# Patient Record
Sex: Female | Born: 1955 | Race: Black or African American | Hispanic: No | Smoking: Current every day smoker
Health system: Southern US, Community
[De-identification: ages and names within clinical notes are randomized; demographics above are authoritative.]

## PROBLEM LIST (undated history)

## (undated) DIAGNOSIS — G959 Disease of spinal cord, unspecified: Secondary | ICD-10-CM

## (undated) DIAGNOSIS — I700 Atherosclerosis of aorta: Secondary | ICD-10-CM

## (undated) DIAGNOSIS — J432 Centrilobular emphysema: Secondary | ICD-10-CM

## (undated) DIAGNOSIS — Z720 Tobacco use: Secondary | ICD-10-CM

## (undated) DIAGNOSIS — E785 Hyperlipidemia, unspecified: Secondary | ICD-10-CM

## (undated) DIAGNOSIS — F1210 Cannabis abuse, uncomplicated: Secondary | ICD-10-CM

## (undated) DIAGNOSIS — B1920 Unspecified viral hepatitis C without hepatic coma: Secondary | ICD-10-CM

## (undated) DIAGNOSIS — D649 Anemia, unspecified: Secondary | ICD-10-CM

## (undated) DIAGNOSIS — F17200 Nicotine dependence, unspecified, uncomplicated: Secondary | ICD-10-CM

## (undated) DIAGNOSIS — I10 Essential (primary) hypertension: Secondary | ICD-10-CM

## (undated) DIAGNOSIS — F1010 Alcohol abuse, uncomplicated: Secondary | ICD-10-CM

## (undated) HISTORY — PX: ABDOMINAL HERNIA REPAIR: SHX539

---

## 2013-08-11 ENCOUNTER — Ambulatory Visit
Admission: RE | Admit: 2013-08-11 | Discharge: 2013-08-11 | Disposition: A | Discharge status: Home / Self Care | Payer: No Typology Code available for payment source | Source: Ambulatory Visit | Attending: Infectious Disease | Admitting: Infectious Disease

## 2013-08-11 ENCOUNTER — Other Ambulatory Visit: Payer: Self-pay | Admitting: Infectious Disease

## 2013-08-11 DIAGNOSIS — R7611 Nonspecific reaction to tuberculin skin test without active tuberculosis: Secondary | ICD-10-CM

## 2013-08-11 IMAGING — CR DG CHEST 1V
1 series · 1 of 1 positions shown · non-contrast
Comparison: None.

CLINICAL DATA: Positive PPD test.

EXAM:
CHEST - 1 VIEW

[view not recorded]
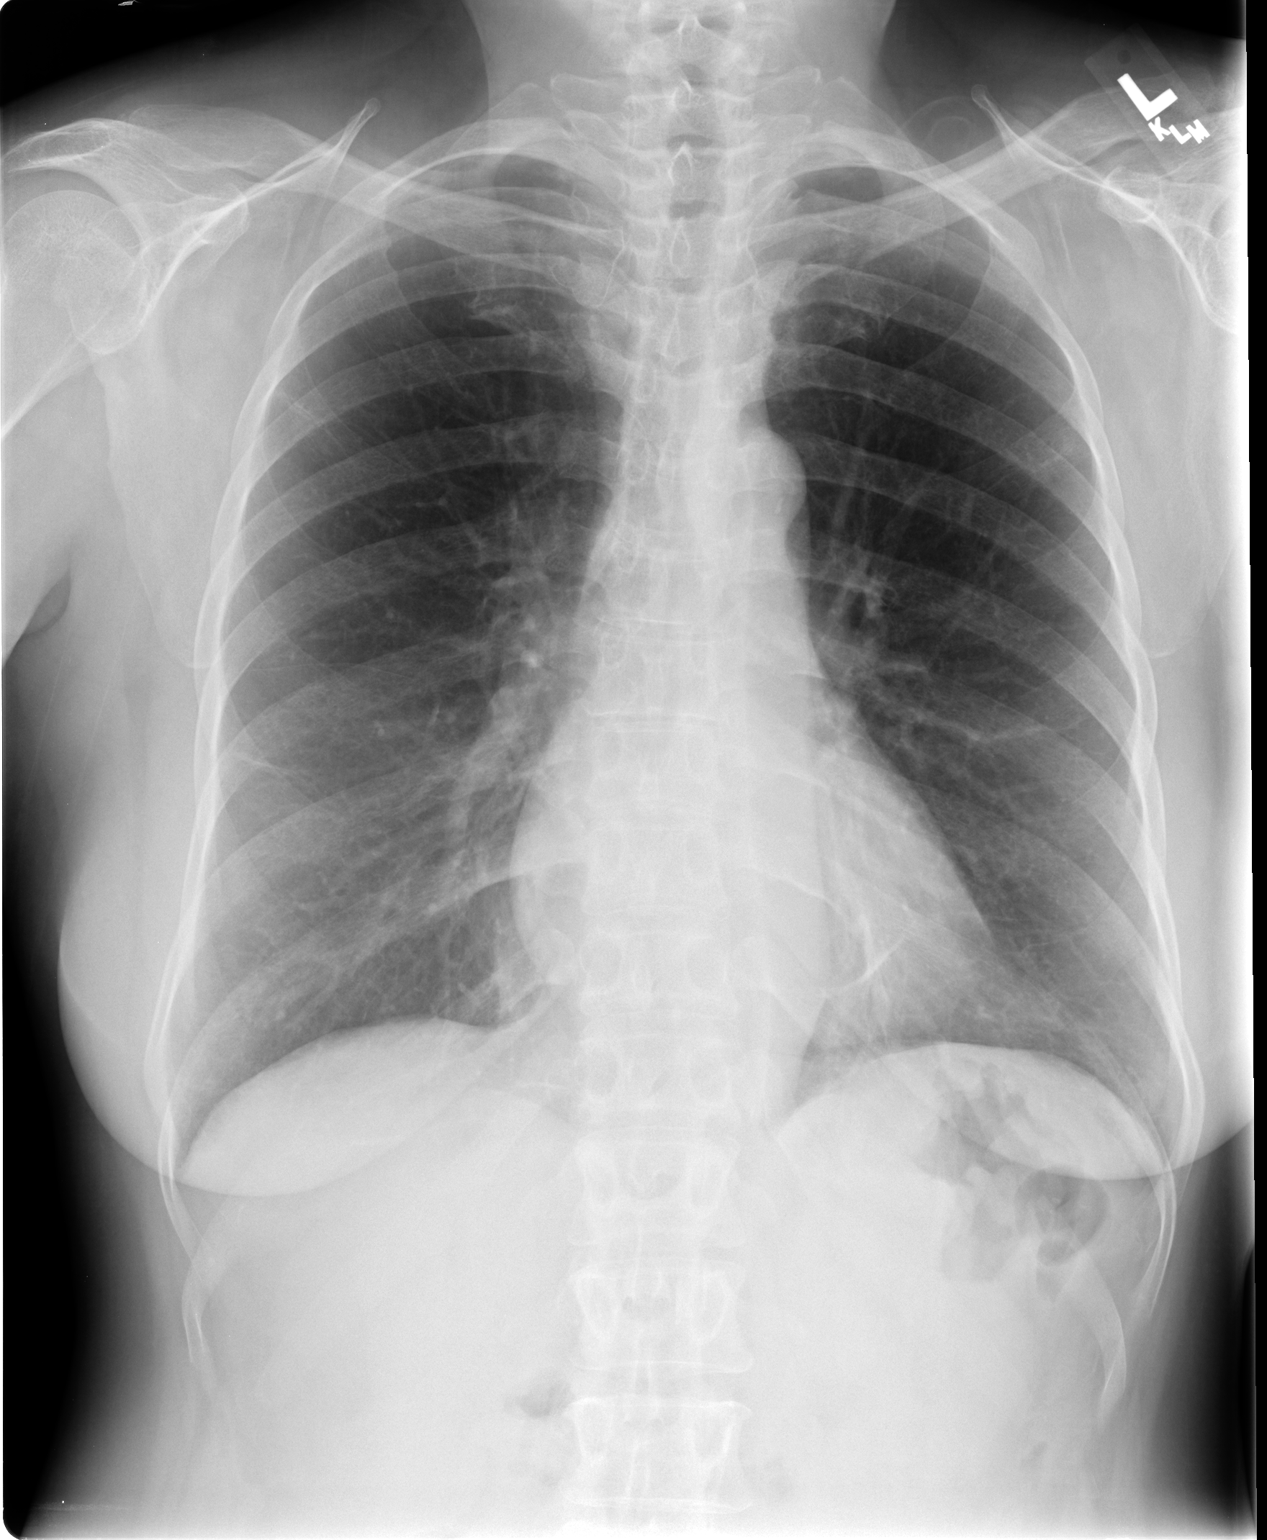

[1 of 1 positions shown; findings below may reference images not displayed]

FINDINGS: Lungs appear hyperinflated. Scattered areas of subsegmental scarring
are present in both lungs. No airspace disease. No cavitary lesions.
Retrocardiac scarring or atelectasis is also noted. The
cardiopericardial silhouette appears within normal limits. Aortic
arch atherosclerosis incidentally noted.
IMPRESSION: No evidence of pulmonary tuberculosis. Scattered areas of pulmonary
parenchymal scarring.

## 2013-08-31 ENCOUNTER — Emergency Department (HOSPITAL_COMMUNITY): Payer: Self-pay

## 2013-08-31 ENCOUNTER — Emergency Department (HOSPITAL_COMMUNITY)
Admission: EM | Admit: 2013-08-31 | Discharge: 2013-08-31 | Disposition: A | Discharge status: Home / Self Care | Payer: Self-pay | Attending: Emergency Medicine | Admitting: Emergency Medicine

## 2013-08-31 ENCOUNTER — Encounter (HOSPITAL_COMMUNITY): Payer: Self-pay | Admitting: Emergency Medicine

## 2013-08-31 DIAGNOSIS — IMO0001 Reserved for inherently not codable concepts without codable children: Secondary | ICD-10-CM | POA: Insufficient documentation

## 2013-08-31 DIAGNOSIS — F17200 Nicotine dependence, unspecified, uncomplicated: Secondary | ICD-10-CM | POA: Insufficient documentation

## 2013-08-31 DIAGNOSIS — Z79899 Other long term (current) drug therapy: Secondary | ICD-10-CM | POA: Insufficient documentation

## 2013-08-31 DIAGNOSIS — M791 Myalgia, unspecified site: Secondary | ICD-10-CM

## 2013-08-31 LAB — BASIC METABOLIC PANEL
BUN: 13 mg/dL (ref 6–23)
CO2: 27 mEq/L (ref 19–32)
Calcium: 9.5 mg/dL (ref 8.4–10.5)
Chloride: 102 mEq/L (ref 96–112)
Creatinine, Ser: 0.89 mg/dL (ref 0.50–1.10)
GFR calc Af Amer: 82 mL/min — ABNORMAL LOW (ref >90)
GFR calc non Af Amer: 71 mL/min — ABNORMAL LOW (ref >90)
Glucose, Bld: 85 mg/dL (ref 70–99)
Potassium: 5.7 mEq/L — ABNORMAL HIGH (ref 3.7–5.3)
Sodium: 139 mEq/L (ref 137–147)

## 2013-08-31 LAB — I-STAT CHEM 8, ED
BUN: 12 mg/dL (ref 6–23)
Calcium, Ion: 1.22 mmol/L (ref 1.12–1.23)
Chloride: 104 mEq/L (ref 96–112)
Creatinine, Ser: 1 mg/dL (ref 0.50–1.10)
Glucose, Bld: 79 mg/dL (ref 70–99)
HCT: 37 % (ref 36.0–46.0)
Hemoglobin: 12.6 g/dL (ref 12.0–15.0)
Potassium: 4.5 mEq/L (ref 3.7–5.3)
Sodium: 144 mEq/L (ref 137–147)
TCO2: 28 mmol/L (ref 0–100)

## 2013-08-31 LAB — URINALYSIS, ROUTINE W REFLEX MICROSCOPIC
Glucose, UA: NEGATIVE mg/dL
Ketones, ur: 15 mg/dL — AB
Nitrite: POSITIVE — AB
Protein, ur: NEGATIVE mg/dL
Specific Gravity, Urine: 1.016 (ref 1.005–1.030)
Urobilinogen, UA: 1 mg/dL (ref 0.0–1.0)
pH: 5.5 (ref 5.0–8.0)

## 2013-08-31 LAB — CBC WITH DIFFERENTIAL/PLATELET
Basophils Absolute: 0 10*3/uL (ref 0.0–0.1)
Basophils Relative: 0 % (ref 0–1)
Eosinophils Absolute: 0.1 10*3/uL (ref 0.0–0.7)
Eosinophils Relative: 1 % (ref 0–5)
HCT: 35 % — ABNORMAL LOW (ref 36.0–46.0)
Hemoglobin: 12 g/dL (ref 12.0–15.0)
Lymphocytes Relative: 29 % (ref 12–46)
Lymphs Abs: 2.6 10*3/uL (ref 0.7–4.0)
MCH: 30.9 pg (ref 26.0–34.0)
MCHC: 34.3 g/dL (ref 30.0–36.0)
MCV: 90.2 fL (ref 78.0–100.0)
Monocytes Absolute: 1.3 10*3/uL — ABNORMAL HIGH (ref 0.1–1.0)
Monocytes Relative: 15 % — ABNORMAL HIGH (ref 3–12)
Neutro Abs: 4.9 10*3/uL (ref 1.7–7.7)
Neutrophils Relative %: 55 % (ref 43–77)
Platelets: 139 10*3/uL — ABNORMAL LOW (ref 150–400)
RBC: 3.88 MIL/uL (ref 3.87–5.11)
RDW: 12.7 % (ref 11.5–15.5)
WBC: 8.8 10*3/uL (ref 4.0–10.5)

## 2013-08-31 LAB — URINE MICROSCOPIC-ADD ON

## 2013-08-31 IMAGING — CR DG CHEST 2V
2 series · 2 of 2 positions shown · non-contrast
Comparison: [DATE]

CLINICAL DATA: Cough

EXAM:
CHEST  2 VIEW

[w chest pa]
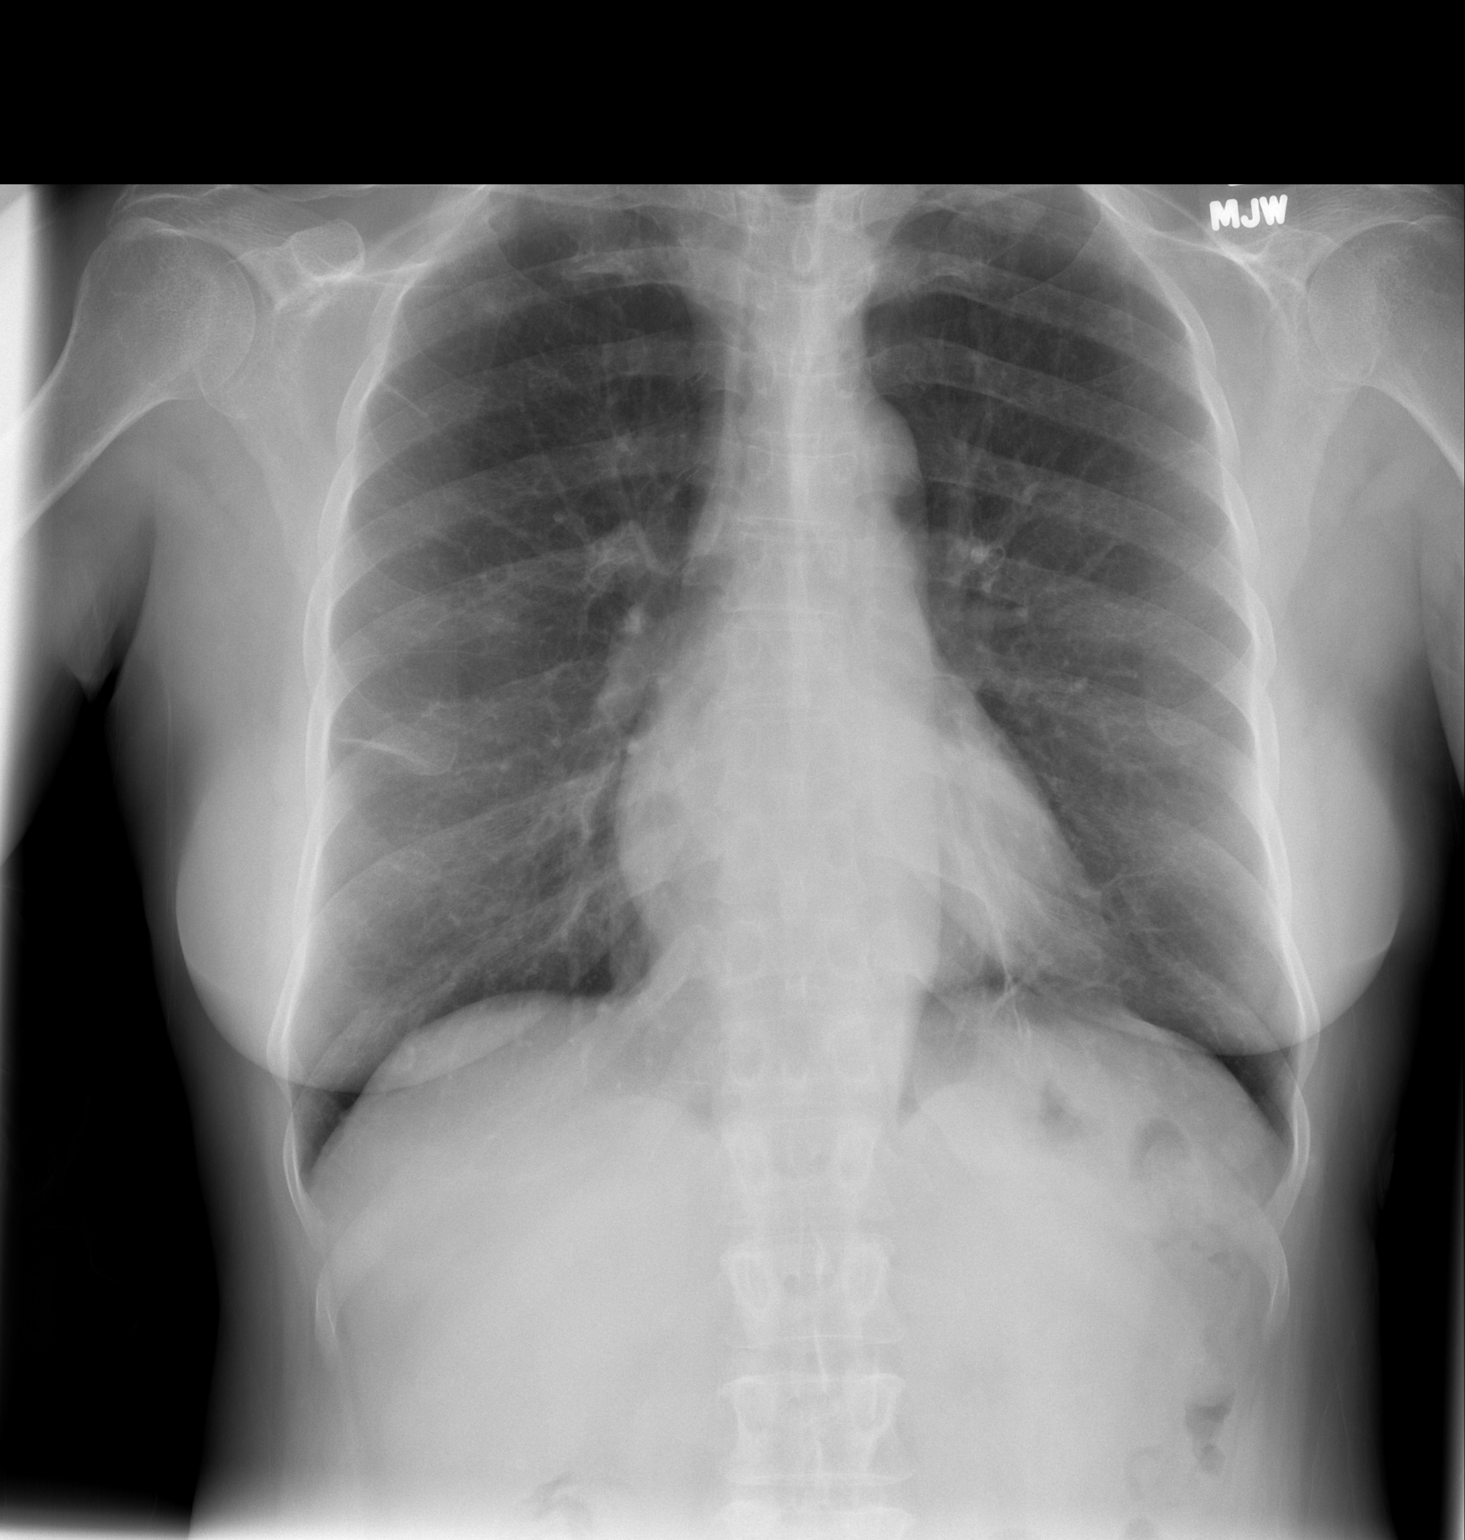

[w chest lat]
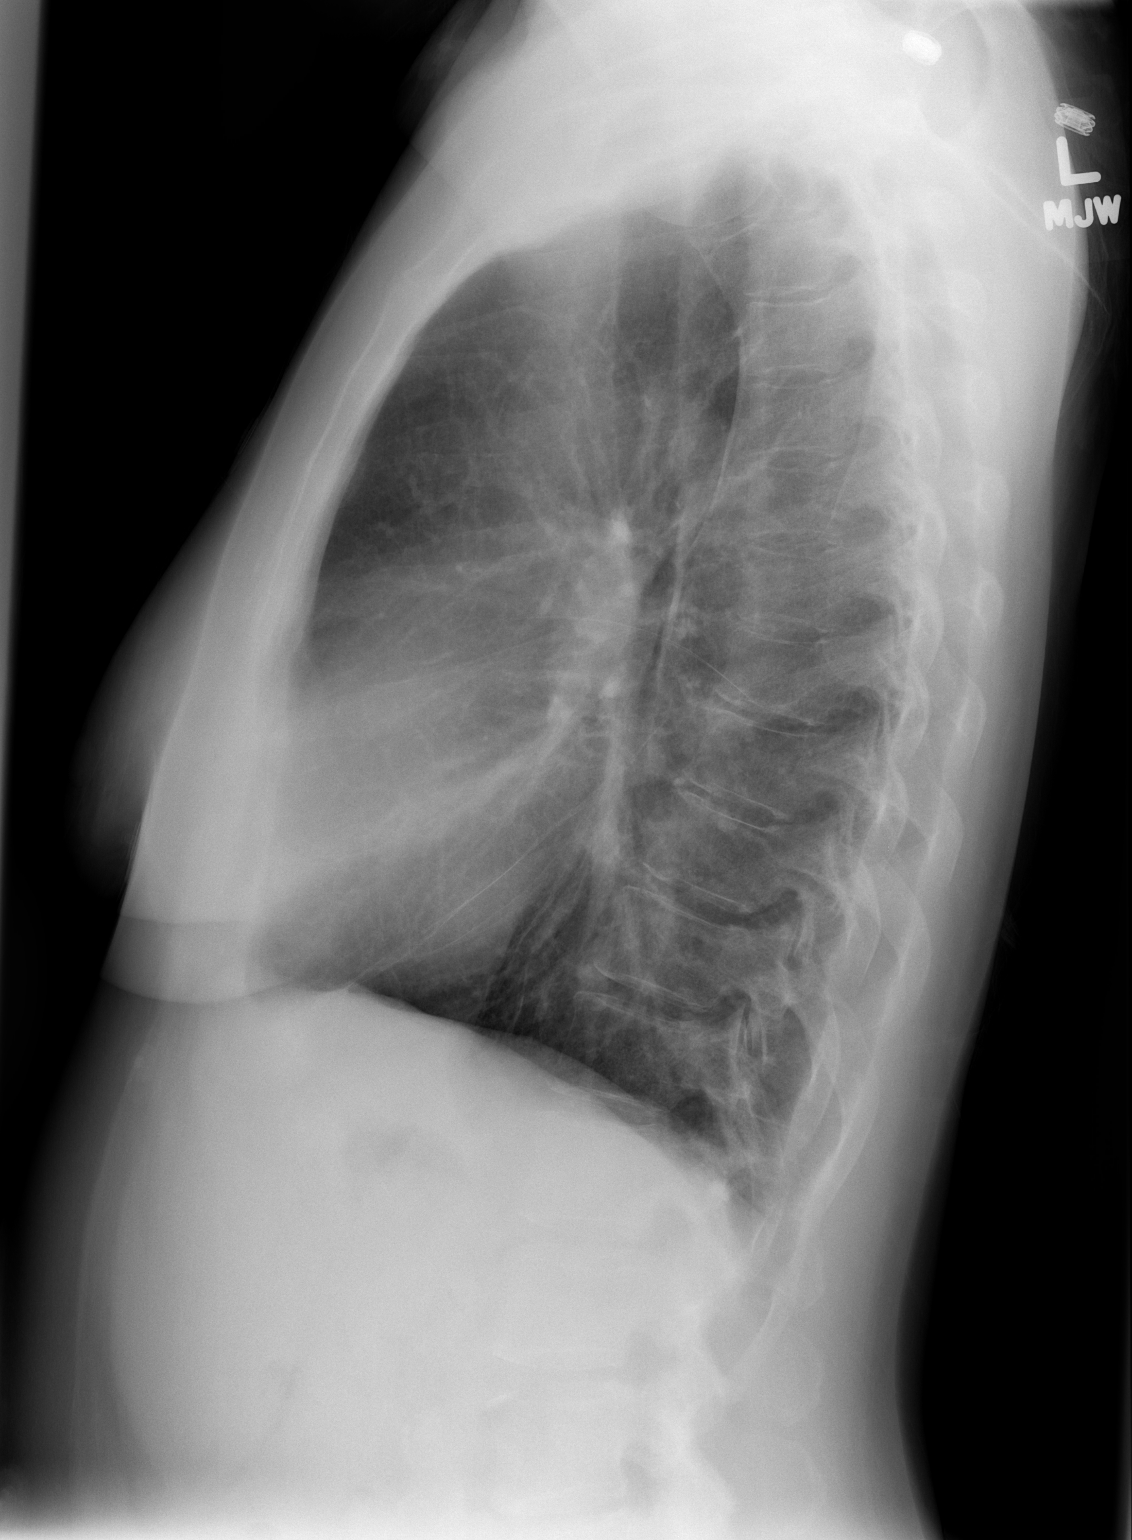

[2 of 2 positions shown; findings below may reference images not displayed]

FINDINGS: Cardiomediastinal silhouette is stable. Mild hyperinflation. No
acute infiltrate or pulmonary edema. Linear scarring noted in right
midlung and right upper lobe. Mild linear scarring retrocardiac
region in left base. Mild degenerative changes thoracic spine. No
acute infiltrate or pulmonary edema. There is vague nodular density
in left upper lobe measures 4 mm. Further correlation with CT scan
of the chest is recommended.
IMPRESSION: No acute infiltrate or pulmonary edema. Linear scarring right upper
and right lower lobe and left retrocardiac region. There is vague
nodular density in left upper lobe measures about 4 mm. Further
correlation with nonemergent CT scan of the chest is recommended.

## 2013-08-31 MED ORDER — SODIUM CHLORIDE 0.9 % IV BOLUS (SEPSIS)
1000.0000 mL | Freq: Once | INTRAVENOUS | Status: AC
  Administered 2013-08-31: 1000 mL via INTRAVENOUS

## 2013-08-31 MED ORDER — HYDROCODONE-ACETAMINOPHEN 5-325 MG PO TABS: 1.0000 | ORAL_TABLET | Freq: Four times a day (QID) | ORAL | Status: DC | PRN

## 2013-08-31 MED ORDER — MORPHINE SULFATE 4 MG/ML IJ SOLN
6.0000 mg | Freq: Once | INTRAMUSCULAR | Status: DC
  Filled 2013-08-31: qty 2

## 2013-08-31 MED ORDER — ONDANSETRON HCL 4 MG/2ML IJ SOLN
4.0000 mg | Freq: Once | INTRAMUSCULAR | Status: DC
  Filled 2013-08-31: qty 2

## 2013-08-31 NOTE — Discharge Instructions (Signed)
Return here as needed. Your testing here today was normal. Follow up with your doctor for a recheck.

## 2013-08-31 NOTE — ED Notes (Signed)
Spoke with Cordelia PenSherry in infectious disease and she has spoke with [facility] concerning patient.  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  She showed up there with symptoms they felt are related were related to her medication and taking it with alcohol.  No airborne precautions needed

## 2013-08-31 NOTE — ED Provider Notes (Signed)
CSN: [id number]     Arrival date & time 08/31/13  1030 History   First MD Initiated Contact with Patient 08/31/13 1146     Chief Complaint  Patient presents with  . Generalized Body Aches     (Consider location/radiation/quality/duration/timing/severity/associated sxs/prior Treatment) HPI Patient presents emergency department with generalized bodyaches.  Patient, states, that she was started on rifampin for possible latent tuberculosis.  Patient, states, that she was sleeping on a floor as well her pain started.  Patient denies chest pain, nausea, vomiting, diarrhea, headache, blurred vision, weakness, numbness, dizziness, fever, shortness of breath, or syncope. History reviewed. No pertinent past medical history. History reviewed. No pertinent past surgical history. History reviewed. No pertinent family history. History  Substance Use Topics  . Smoking status: Current Every Day Smoker  . Smokeless tobacco: Not on file  . Alcohol Use: Yes   OB History   Grav Para Term Preterm Abortions TAB SAB Ect Mult Living                 Review of Systems  All other systems negative except as documented in the HPI. All pertinent positives and negatives as reviewed in the HPI.  Allergies  Review of patient's allergies indicates not on file.  Home Medications   Current Outpatient Rx  Name  Route  Sig  Dispense  Refill  . ibuprofen (ADVIL,MOTRIN) 200 MG tablet   Oral   Take 400 mg by mouth every 6 (six) hours as needed for moderate pain.         . rifampin (RIFADIN) 300 MG capsule   Oral   Take 600 mg by mouth daily.          BP 121/72  Pulse 73  Temp(Src) 97.7 F (36.5 C) (Oral)  Resp 18  Wt 140 lb (63.504 kg)  SpO2 99% Physical Exam  Nursing note and vitals reviewed. Constitutional: She is oriented to person, place, and time. She appears well-developed and well-nourished. No distress.  HENT:  Head: Normocephalic and atraumatic.  Mouth/Throat: Oropharynx is clear and  moist.  Eyes: Pupils are equal, round, and reactive to light.  Neck: Normal range of motion. Neck supple.  Cardiovascular: Normal rate, regular rhythm and normal heart sounds.  Exam reveals no gallop and no friction rub.   No murmur heard. Pulmonary/Chest: Effort normal and breath sounds normal. No respiratory distress.  Abdominal: Soft. Bowel sounds are normal. She exhibits no distension. There is no tenderness.  Neurological: She is alert and oriented to person, place, and time. She exhibits normal muscle tone. Coordination normal.  Skin: Skin is warm and dry.    ED Course  Procedures (including critical care time) Labs Review Labs Reviewed  BASIC METABOLIC PANEL - Abnormal; Notable for the following:    Potassium 5.7 (*)    GFR calc non Af Amer 71 (*)    GFR calc Af Amer 82 (*)    All other components within normal limits  CBC WITH DIFFERENTIAL - Abnormal; Notable for the following:    HCT 35.0 (*)    Platelets 139 (*)    Monocytes Relative 15 (*)    Monocytes Absolute 1.3 (*)    All other components within normal limits  URINALYSIS, ROUTINE W REFLEX MICROSCOPIC - Abnormal; Notable for the following:    Color, Urine ORANGE (*)    APPearance HAZY (*)    Hgb urine dipstick SMALL (*)    Bilirubin Urine SMALL (*)    Ketones, ur 15 (*)  Nitrite POSITIVE (*)    Leukocytes, UA SMALL (*)    All other components within normal limits  URINE MICROSCOPIC-ADD ON - Abnormal; Notable for the following:    Squamous Epithelial / LPF FEW (*)    Bacteria, UA FEW (*)    All other components within normal limits   Imaging Review Dg Chest 2 View  08/31/2013   CLINICAL DATA:  Cough  EXAM: CHEST  2 VIEW  COMPARISON:  08/11/2013  FINDINGS: Cardiomediastinal silhouette is stable. Mild hyperinflation. No acute infiltrate or pulmonary edema. Linear scarring noted in right midlung and right upper lobe. Mild linear scarring retrocardiac region in left base. Mild degenerative changes thoracic spine.  No acute infiltrate or pulmonary edema. There is vague nodular density in left upper lobe measures 4 mm. Further correlation with CT scan of the chest is recommended.  IMPRESSION: No acute infiltrate or pulmonary edema. Linear scarring right upper and right lower lobe and left retrocardiac region. There is vague nodular density in left upper lobe measures about 4 mm. Further correlation with nonemergent CT scan of the chest is recommended.   Electronically Signed   By: Natasha MeadLiviu  Pop M.D.   On: 08/31/2013 13:44     EKG Interpretation   Date/Time:  Monday August 31 2013 11:01:23 EDT Ventricular Rate:  81 PR Interval:  118 QRS Duration: 82 QT Interval:  384 QTC Calculation: 446 R Axis:   59 Text Interpretation:  Normal sinus rhythm Normal ECG No old tracing to  compare Confirmed by GOLDSTON  MD, SCOTT ([id number]) on 08/31/2013 11:41:30 AM      Patient will be advised to return here as needed.  The IV fluids and felt with patient's discomfort.  Area.  For pain medications.  The patient is advised to follow up with her primary care Dr. is also advised to increase her fluid intake, as much as possible.  Patient is advised to continue taking her rifampin.    Carlyle Dollyhristopher W Anisha Starliper, PA-C 08/31/13 [phone]

## 2013-08-31 NOTE — ED Notes (Signed)
Pt is not wanting to answer questions, pt visitors continues to interrupt assessment in triage, pt sts pain over and over again when asked what is wrong today.

## 2013-08-31 NOTE — ED Notes (Signed)
Pt reports back pain started when sleeping on floor, pt is being treated for tuberculosis for abn xray, pt reports pain started this morning. She frequently sleeps on the floor.

## 2013-09-03 NOTE — ED Provider Notes (Signed)
Medical screening examination/treatment/procedure(s) were performed by non-physician practitioner and as supervising physician I was immediately available for consultation/collaboration.   EKG Interpretation   Date/Time:  Monday August 31 2013 11:01:23 EDT Ventricular Rate:  81 PR Interval:  118 QRS Duration: 82 QT Interval:  384 QTC Calculation: 446 R Axis:   59 Text Interpretation:  Normal sinus rhythm Normal ECG No old tracing to  compare Confirmed by Hemi Chacko  MD, Tyshea Imel ([id number]) on 08/31/2013 11:41:30 AM        Audree CamelScott T Abdimalik Mayorquin, MD 09/03/13 [phone]

## 2015-04-22 IMAGING — US US ABDOMEN LIMITED
1 series · 14 of 25 positions shown · non-contrast
Comparison: None.

CLINICAL DATA: Elevated LFTs. Right upper quadrant pain today.
Nausea and vomiting.

EXAM:
US ABDOMEN LIMITED - RIGHT UPPER QUADRANT

[Series 1: us abdomen limited · 0.15mm/px · 14 of 43 slices shown]
[im 1/43]
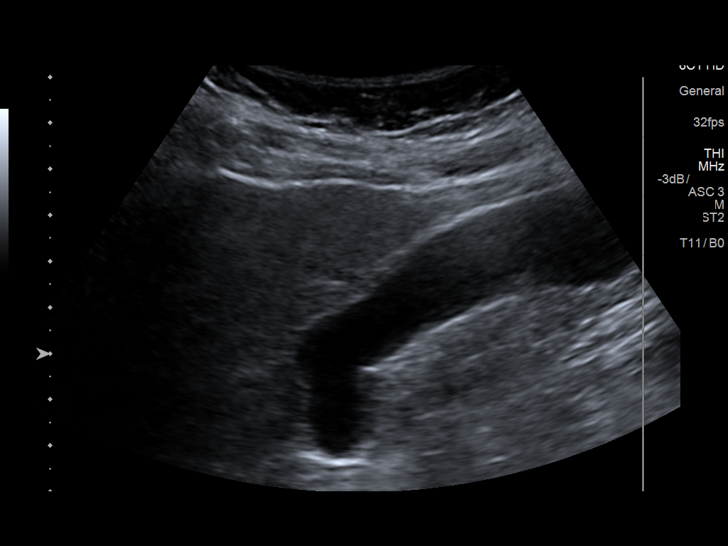
[im 4/43]
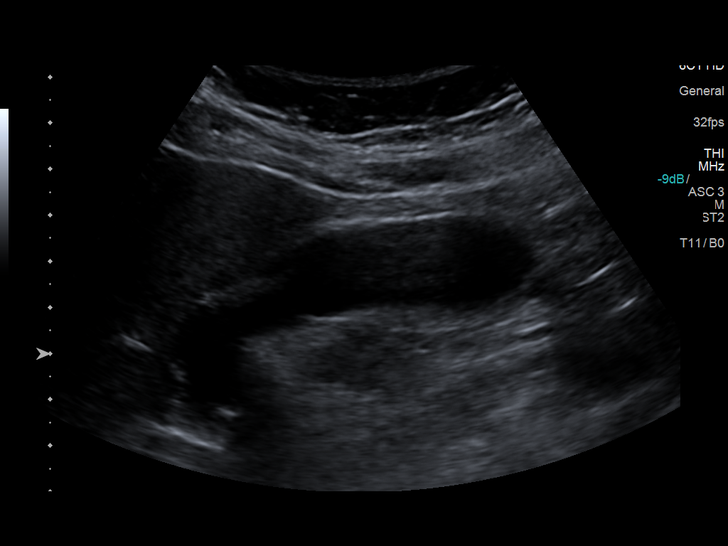
[im 8/43]
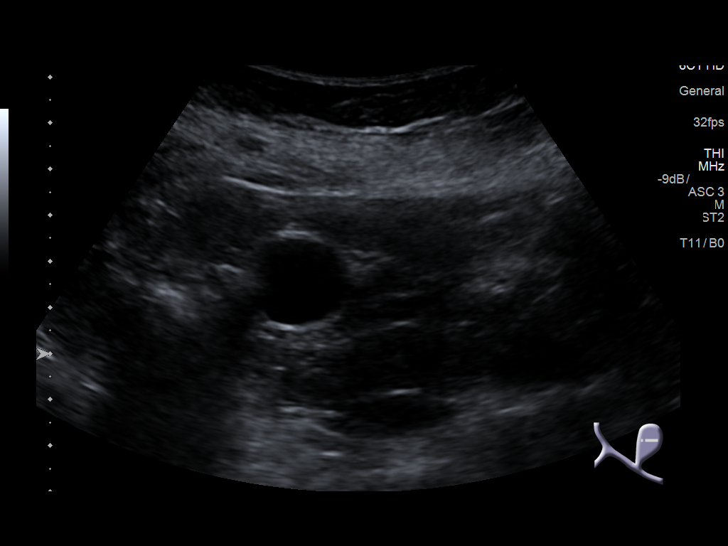
[im 11/43]
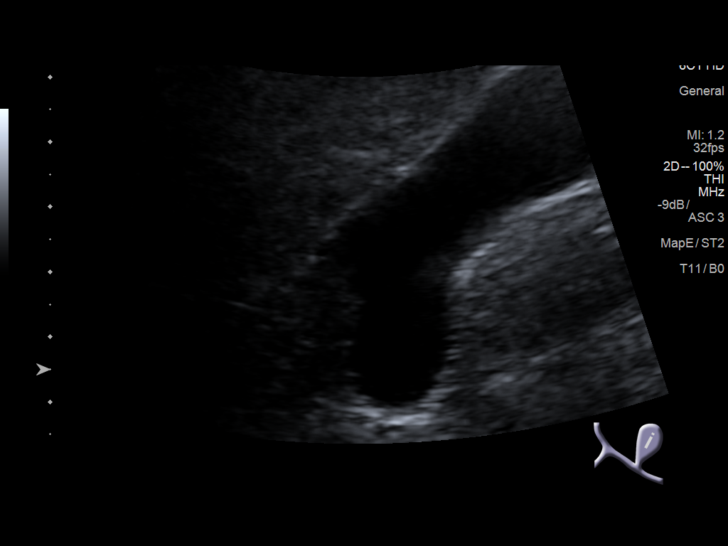
[im 15/43]
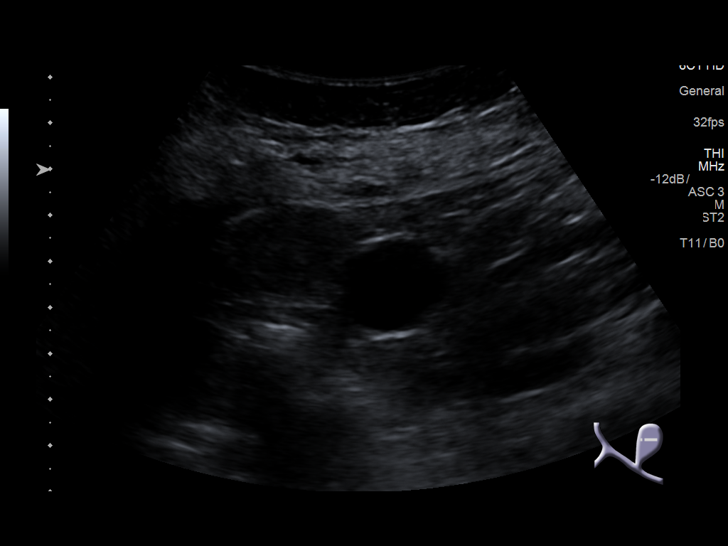
[im 16/43]
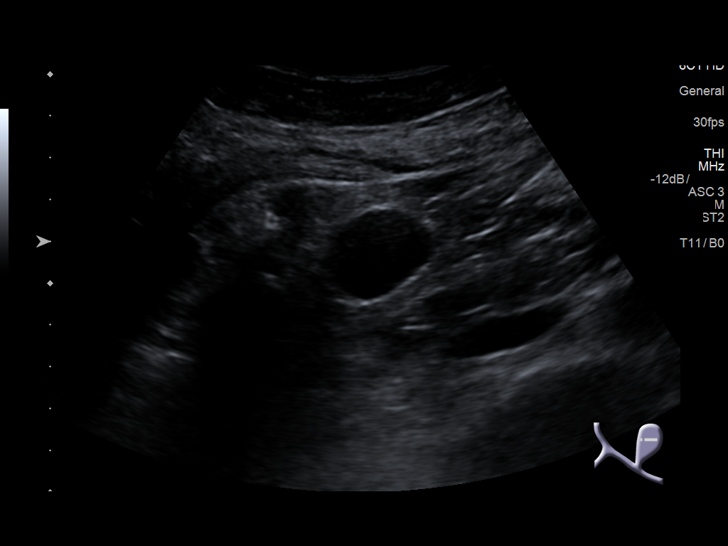
[im 20/43]
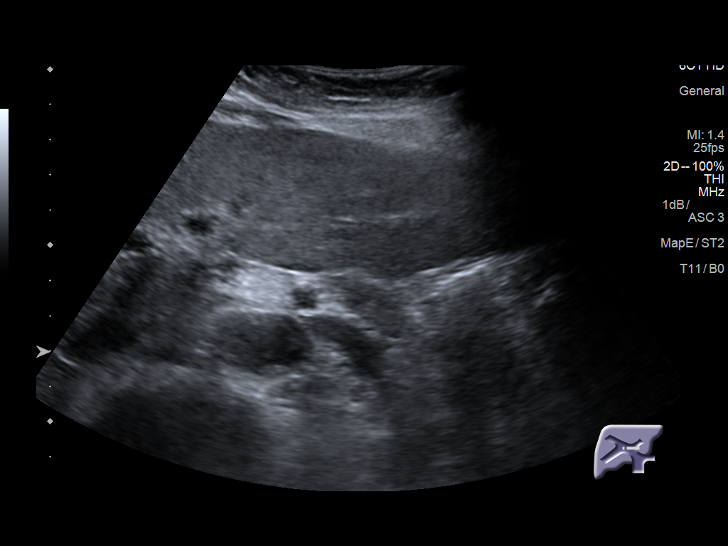
[im 23/43]
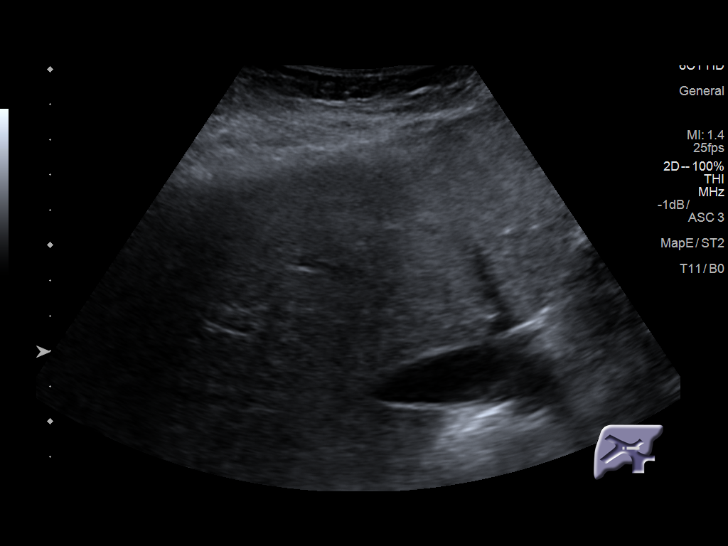
[im 27/43]
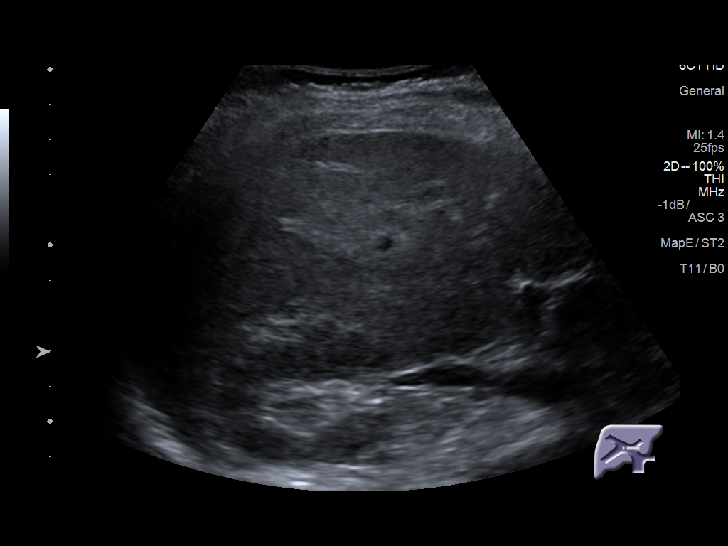
[im 29/43]
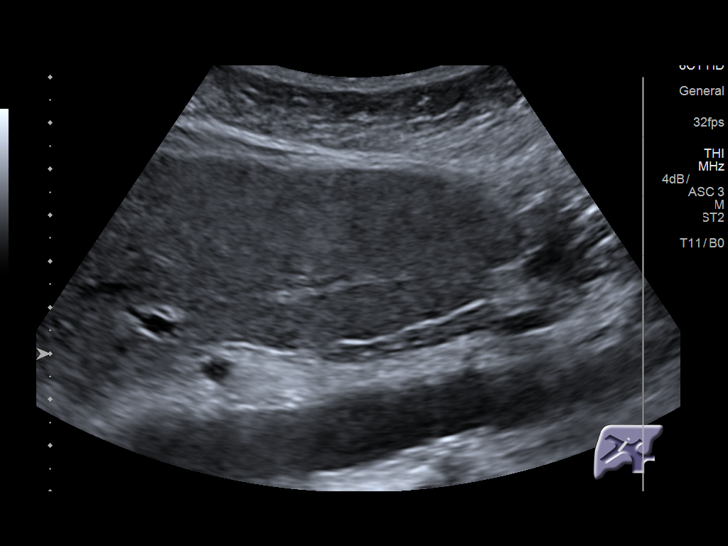
[im 32/43]
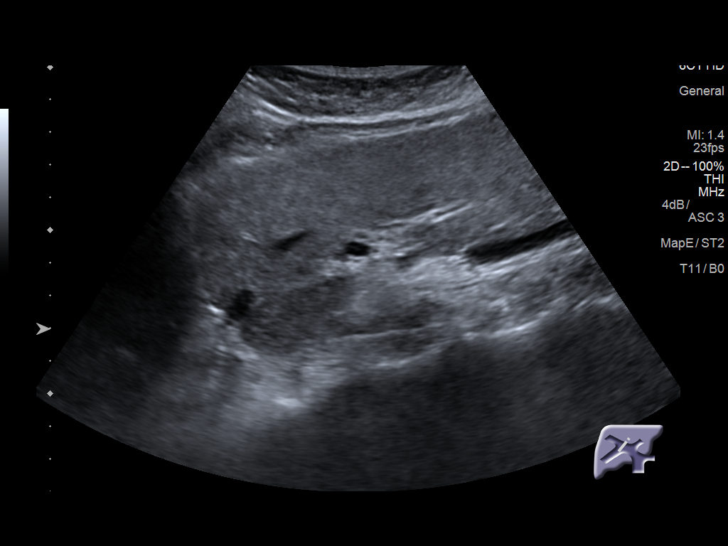
[im 36/43]
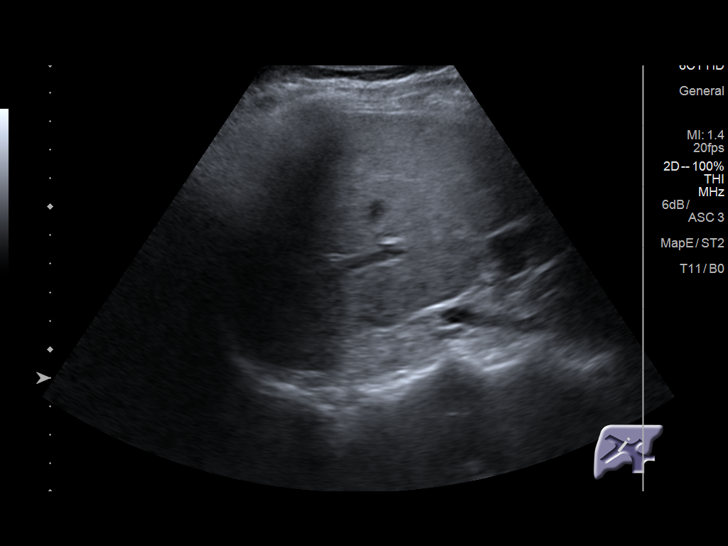
[im 39/43]
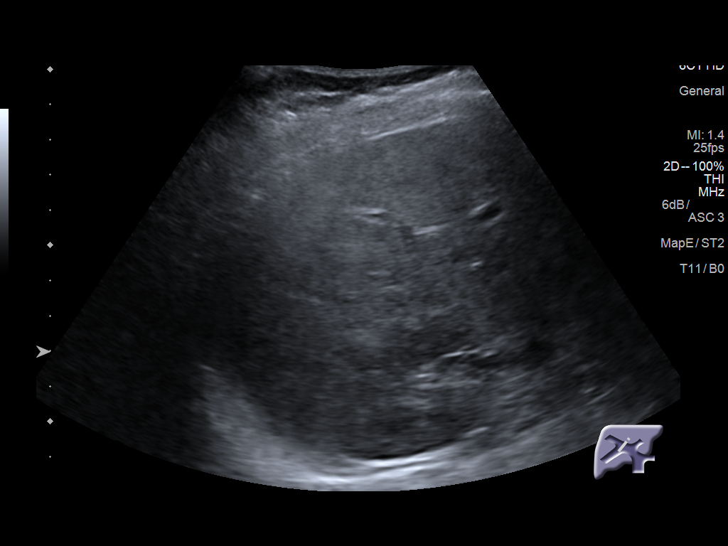
[im 43/43]
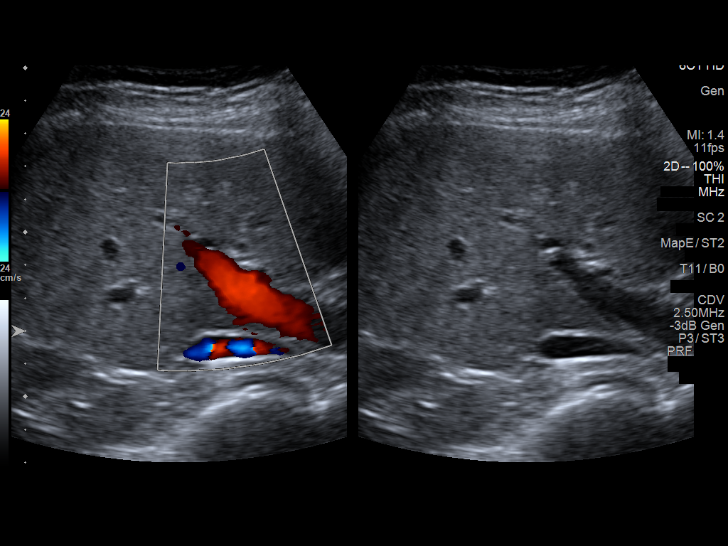

[14 of 25 positions shown; findings below may reference images not displayed]

FINDINGS: Gallbladder:

Physiologically distended. No gallstones or wall thickening
visualized. No sonographic Murphy sign noted by sonographer.

Common bile duct:

Diameter: 2 mm.

Liver:

No focal lesion identified. Within normal limits in parenchymal
echogenicity. Hepatic contours are lobular.
IMPRESSION: 1. Normal sonographic appearance of gallbladder. No biliary
dilatation.
2. Lobular hepatic contours, raises concern for underlying
cirrhosis, however echotexture appears normal. No focal hepatic
abnormality.

## 2015-04-28 ENCOUNTER — Other Ambulatory Visit (HOSPITAL_COMMUNITY)
Admission: RE | Admit: 2015-04-28 | Discharge: 2015-04-28 | Disposition: A | Discharge status: Home / Self Care | Payer: BC Managed Care – PPO | Source: Ambulatory Visit | Attending: Family Medicine | Admitting: Family Medicine

## 2015-04-28 ENCOUNTER — Inpatient Hospital Stay (HOSPITAL_COMMUNITY): Admit: 2015-04-28 | Payer: Self-pay

## 2015-04-28 DIAGNOSIS — Z124 Encounter for screening for malignant neoplasm of cervix: Secondary | ICD-10-CM | POA: Diagnosis present

## 2015-04-29 ENCOUNTER — Other Ambulatory Visit: Payer: Self-pay

## 2015-04-29 DIAGNOSIS — Z1231 Encounter for screening mammogram for malignant neoplasm of breast: Secondary | ICD-10-CM

## 2015-05-09 ENCOUNTER — Ambulatory Visit: Payer: Self-pay

## 2015-05-11 ENCOUNTER — Ambulatory Visit
Admission: RE | Admit: 2015-05-11 | Discharge: 2015-05-11 | Disposition: A | Discharge status: Home / Self Care | Payer: BC Managed Care – PPO | Source: Ambulatory Visit

## 2015-05-11 DIAGNOSIS — Z1231 Encounter for screening mammogram for malignant neoplasm of breast: Secondary | ICD-10-CM

## 2015-05-12 ENCOUNTER — Other Ambulatory Visit: Payer: Self-pay | Admitting: Family Medicine

## 2015-05-12 DIAGNOSIS — R928 Other abnormal and inconclusive findings on diagnostic imaging of breast: Secondary | ICD-10-CM

## 2015-05-20 ENCOUNTER — Ambulatory Visit
Admission: RE | Admit: 2015-05-20 | Discharge: 2015-05-20 | Disposition: A | Discharge status: Home / Self Care | Payer: BC Managed Care – PPO | Source: Ambulatory Visit | Attending: Family Medicine | Admitting: Family Medicine

## 2015-05-20 DIAGNOSIS — R928 Other abnormal and inconclusive findings on diagnostic imaging of breast: Secondary | ICD-10-CM

## 2015-06-20 ENCOUNTER — Emergency Department (HOSPITAL_COMMUNITY): Payer: BC Managed Care – PPO

## 2015-06-20 ENCOUNTER — Encounter (HOSPITAL_COMMUNITY): Payer: Self-pay | Admitting: *Deleted

## 2015-06-20 ENCOUNTER — Emergency Department (HOSPITAL_COMMUNITY)
Admission: EM | Admit: 2015-06-20 | Discharge: 2015-06-21 | Disposition: A | Discharge status: Home / Self Care | Payer: BC Managed Care – PPO | Attending: Emergency Medicine | Admitting: Emergency Medicine

## 2015-06-20 DIAGNOSIS — R1013 Epigastric pain: Secondary | ICD-10-CM | POA: Insufficient documentation

## 2015-06-20 DIAGNOSIS — R0602 Shortness of breath: Secondary | ICD-10-CM | POA: Diagnosis not present

## 2015-06-20 DIAGNOSIS — M25532 Pain in left wrist: Secondary | ICD-10-CM | POA: Diagnosis not present

## 2015-06-20 DIAGNOSIS — R112 Nausea with vomiting, unspecified: Secondary | ICD-10-CM | POA: Diagnosis not present

## 2015-06-20 DIAGNOSIS — F17210 Nicotine dependence, cigarettes, uncomplicated: Secondary | ICD-10-CM | POA: Insufficient documentation

## 2015-06-20 DIAGNOSIS — R748 Abnormal levels of other serum enzymes: Secondary | ICD-10-CM

## 2015-06-20 LAB — COMPREHENSIVE METABOLIC PANEL
ALT: 110 U/L — ABNORMAL HIGH (ref 14–54)
AST: 181 U/L — ABNORMAL HIGH (ref 15–41)
Albumin: 2.9 g/dL — ABNORMAL LOW (ref 3.5–5.0)
Alkaline Phosphatase: 140 U/L — ABNORMAL HIGH (ref 38–126)
Anion gap: 15 (ref 5–15)
BUN: 19 mg/dL (ref 6–20)
CO2: 19 mmol/L — ABNORMAL LOW (ref 22–32)
Calcium: 8.7 mg/dL — ABNORMAL LOW (ref 8.9–10.3)
Chloride: 100 mmol/L — ABNORMAL LOW (ref 101–111)
Creatinine, Ser: 1.33 mg/dL — ABNORMAL HIGH (ref 0.44–1.00)
GFR calc Af Amer: 50 mL/min — ABNORMAL LOW (ref >60)
GFR calc non Af Amer: 43 mL/min — ABNORMAL LOW (ref >60)
Glucose, Bld: 115 mg/dL — ABNORMAL HIGH (ref 65–99)
Potassium: 2.9 mmol/L — ABNORMAL LOW (ref 3.5–5.1)
Sodium: 134 mmol/L — ABNORMAL LOW (ref 135–145)
Total Bilirubin: 1.4 mg/dL — ABNORMAL HIGH (ref 0.3–1.2)
Total Protein: 9 g/dL — ABNORMAL HIGH (ref 6.5–8.1)

## 2015-06-20 LAB — URINE MICROSCOPIC-ADD ON: RBC / HPF: NONE SEEN RBC/hpf (ref 0–5)

## 2015-06-20 LAB — CBC
HCT: 32.6 % — ABNORMAL LOW (ref 36.0–46.0)
Hemoglobin: 10.8 g/dL — ABNORMAL LOW (ref 12.0–15.0)
MCH: 30 pg (ref 26.0–34.0)
MCHC: 33.1 g/dL (ref 30.0–36.0)
MCV: 90.6 fL (ref 78.0–100.0)
Platelets: 99 10*3/uL — ABNORMAL LOW (ref 150–400)
RBC: 3.6 MIL/uL — ABNORMAL LOW (ref 3.87–5.11)
RDW: 12.9 % (ref 11.5–15.5)
WBC: 6.6 10*3/uL (ref 4.0–10.5)

## 2015-06-20 LAB — URINALYSIS, ROUTINE W REFLEX MICROSCOPIC
Glucose, UA: NEGATIVE mg/dL
Hgb urine dipstick: NEGATIVE
Ketones, ur: NEGATIVE mg/dL
Nitrite: NEGATIVE
Protein, ur: NEGATIVE mg/dL
Specific Gravity, Urine: 1.016 (ref 1.005–1.030)
pH: 5 (ref 5.0–8.0)

## 2015-06-20 LAB — I-STAT TROPONIN, ED: Troponin i, poc: 0 ng/mL (ref 0.00–0.08)

## 2015-06-20 LAB — LIPASE, BLOOD: Lipase: 42 U/L (ref 11–51)

## 2015-06-20 IMAGING — DX DG CHEST 2V
2 series · 2 of 2 positions shown · non-contrast
Comparison: None.

CLINICAL DATA: 59-year-old female with shortness of breath

EXAM:
CHEST  2 VIEW

[chest lat]
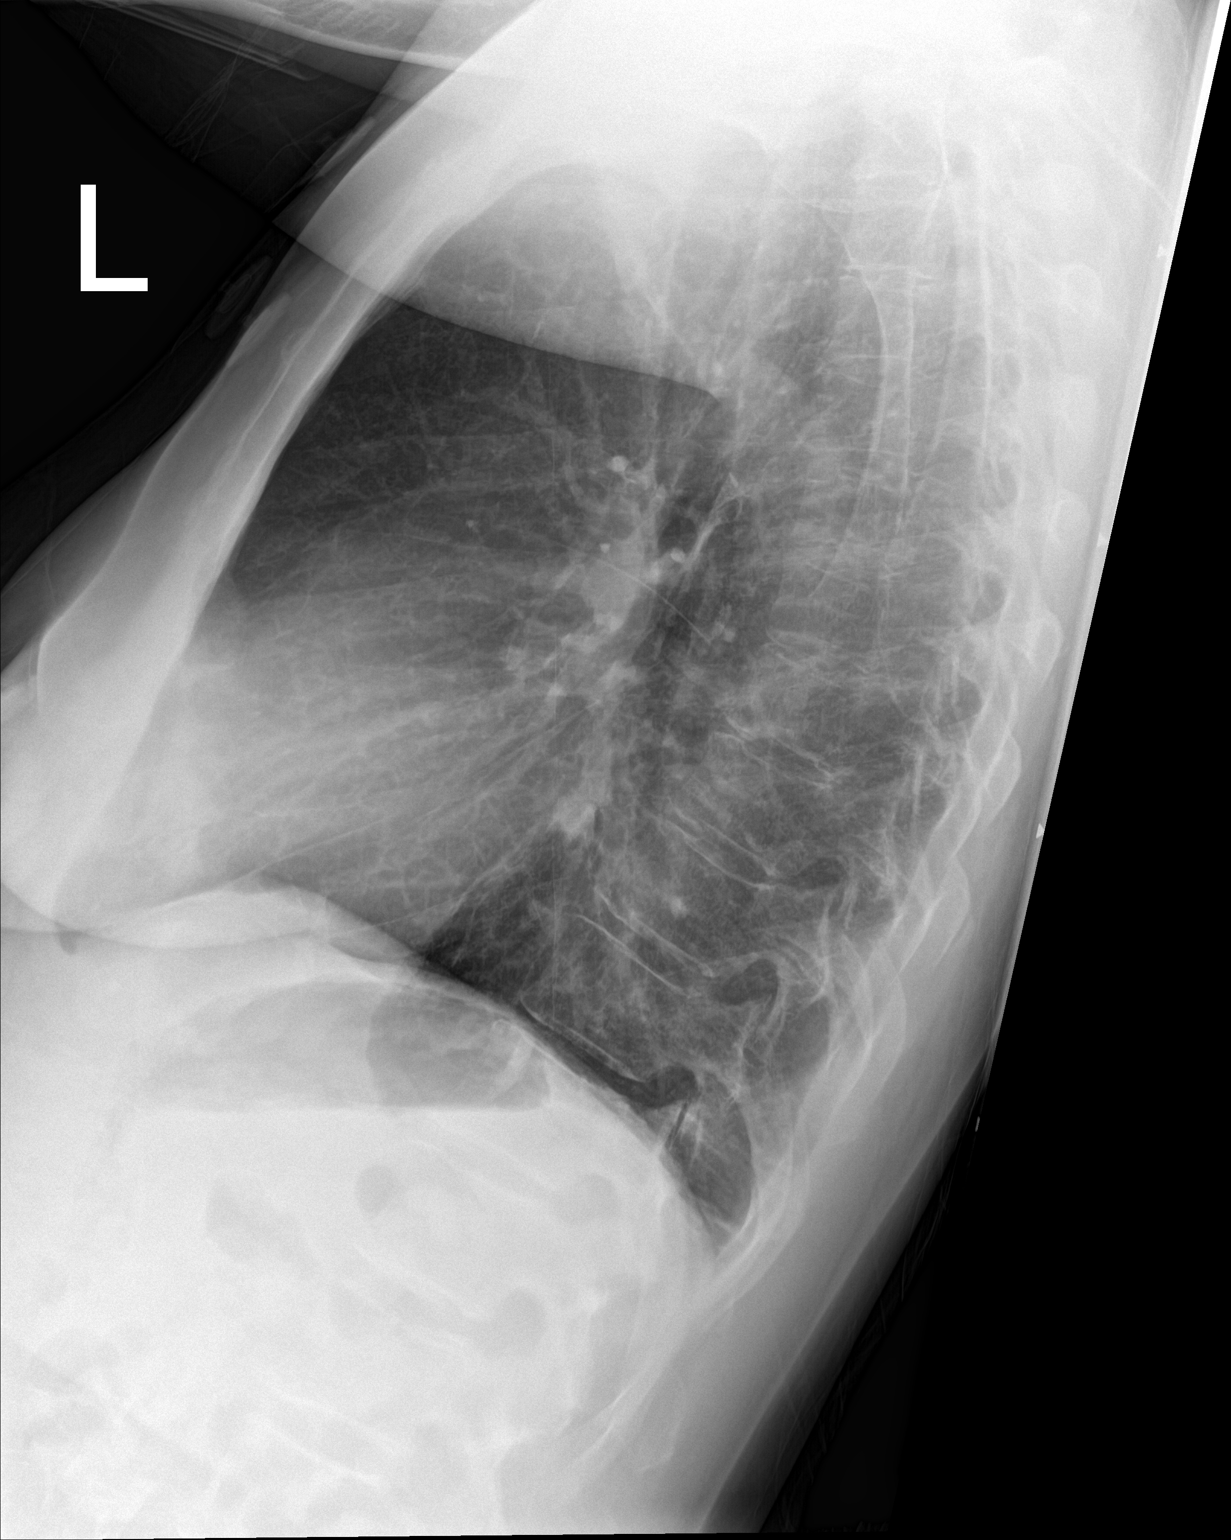

[chest ap]
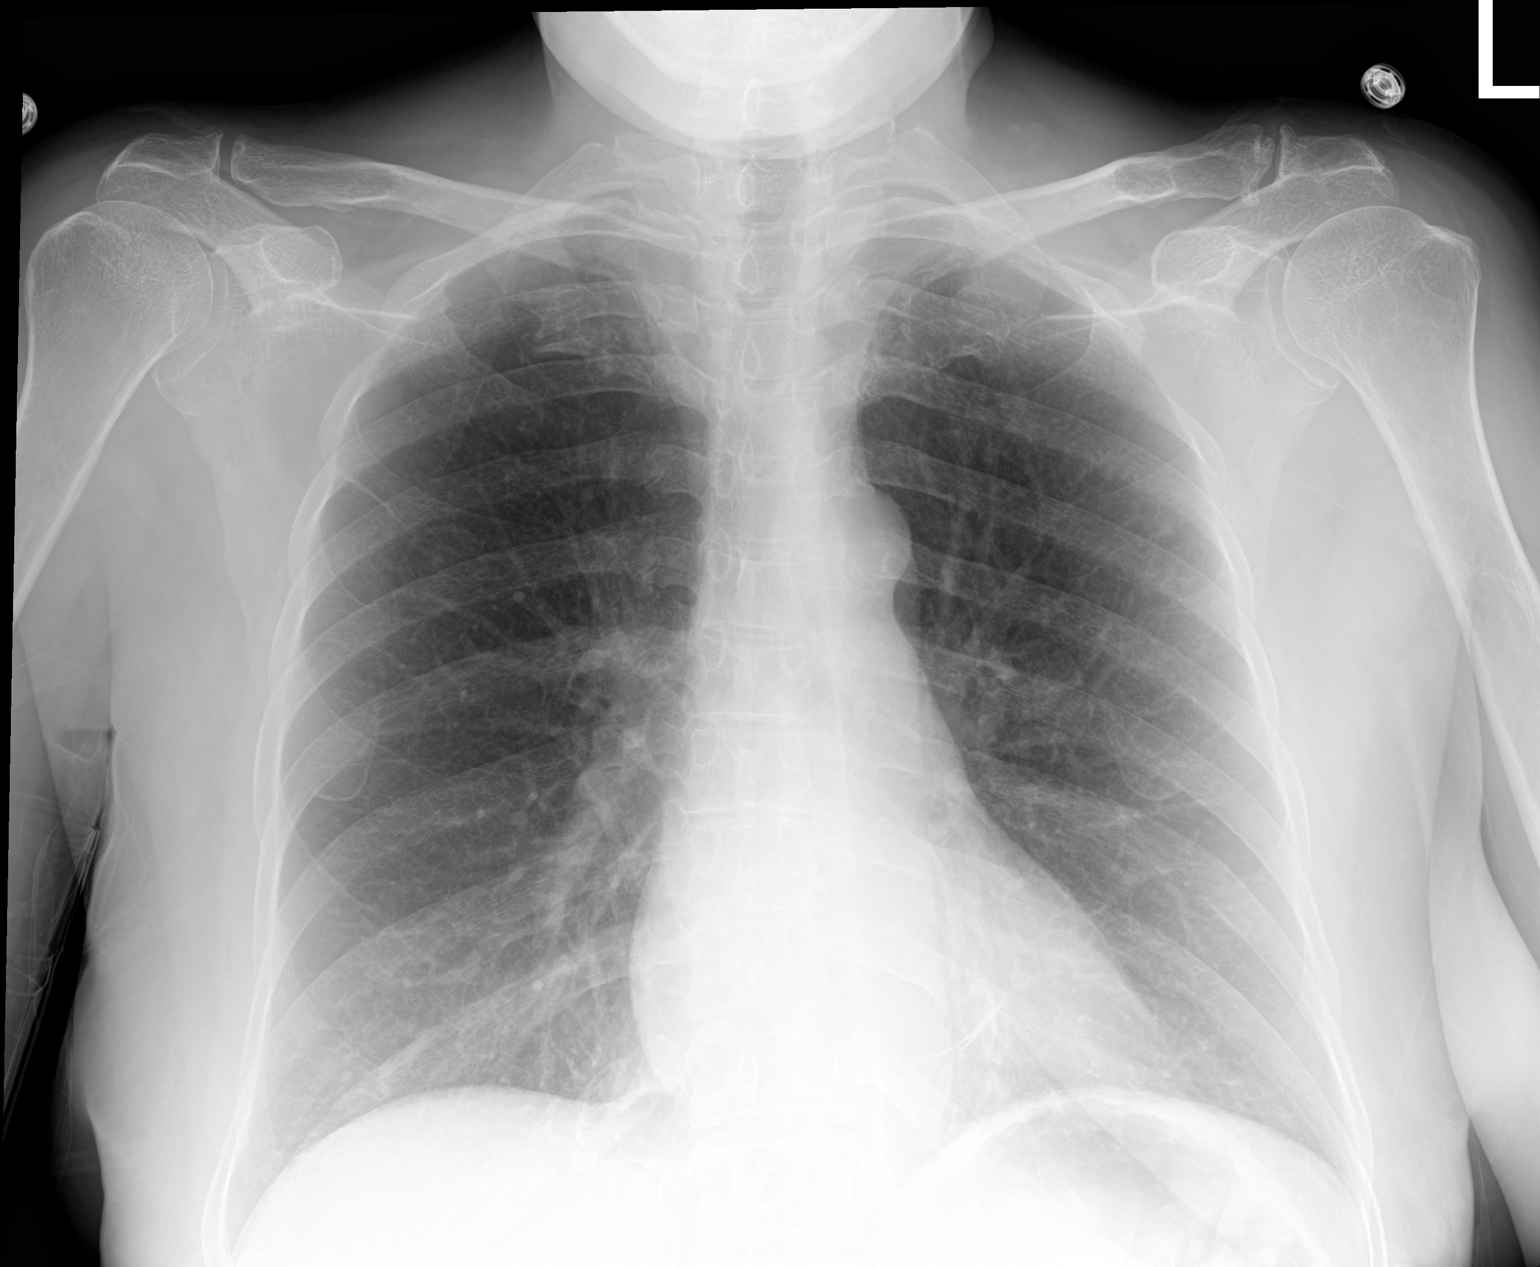

[2 of 2 positions shown; findings below may reference images not displayed]

FINDINGS: Two views of the chest do not demonstrate a focal consolidation. No
pleural effusion or pneumothorax. The cardiac silhouette is within
normal limits. There is degenerative changes of the distal left
clavicle. No acute fracture.
IMPRESSION: No active cardiopulmonary disease.

## 2015-06-20 IMAGING — DX DG WRIST COMPLETE 3+V*L*
4 series · 4 of 4 positions shown · non-contrast
Comparison: None.

CLINICAL DATA: Left wrist pain after shoveling snow today. Initial
encounter.

EXAM:
LEFT WRIST - COMPLETE 3+ VIEW

[wrist pa]
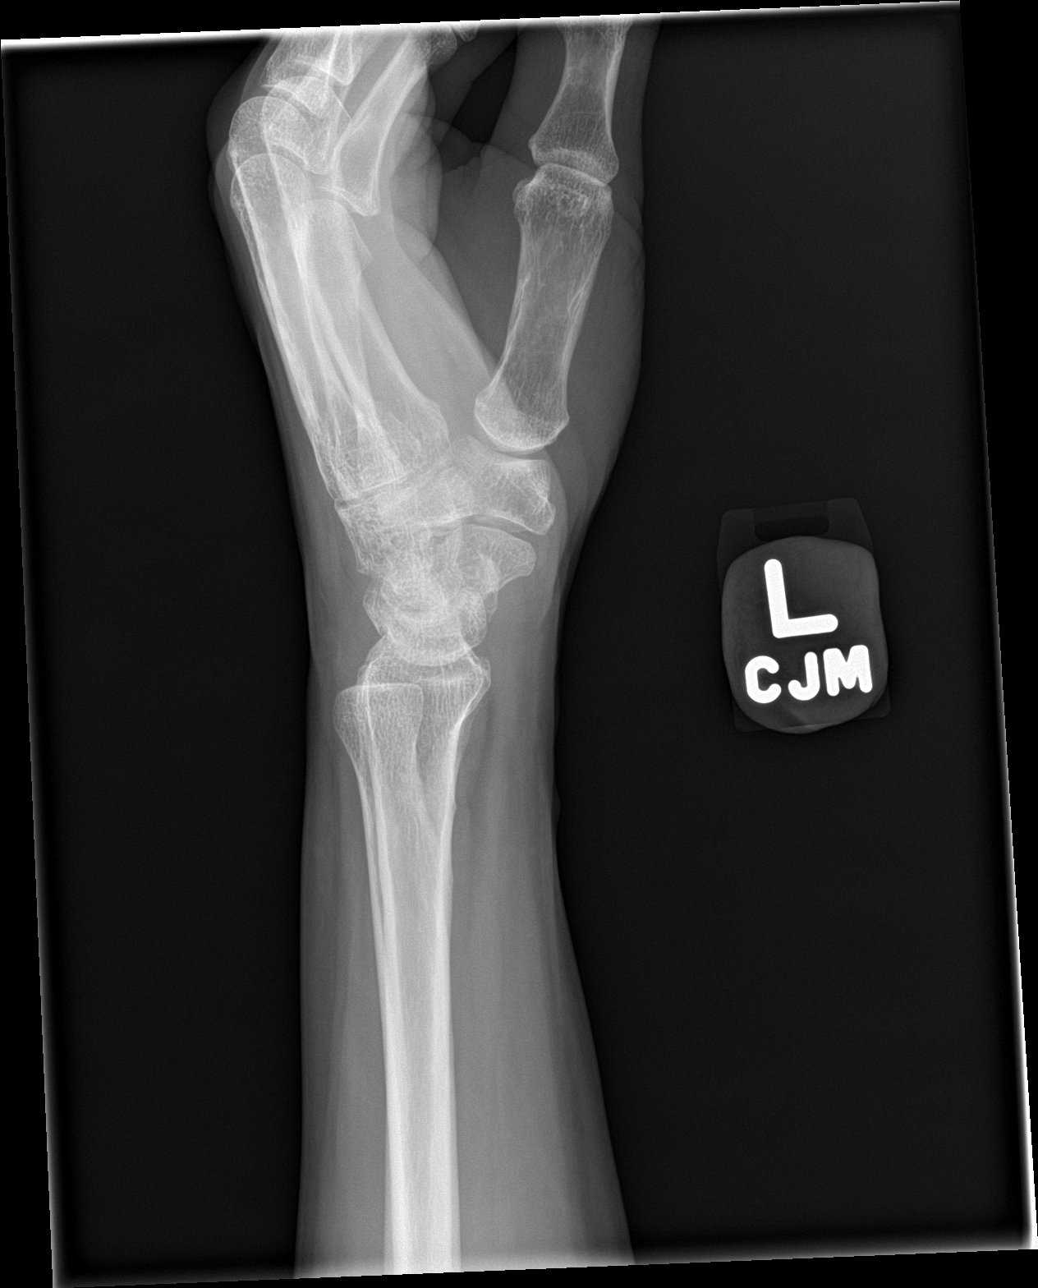

[wrist obl]
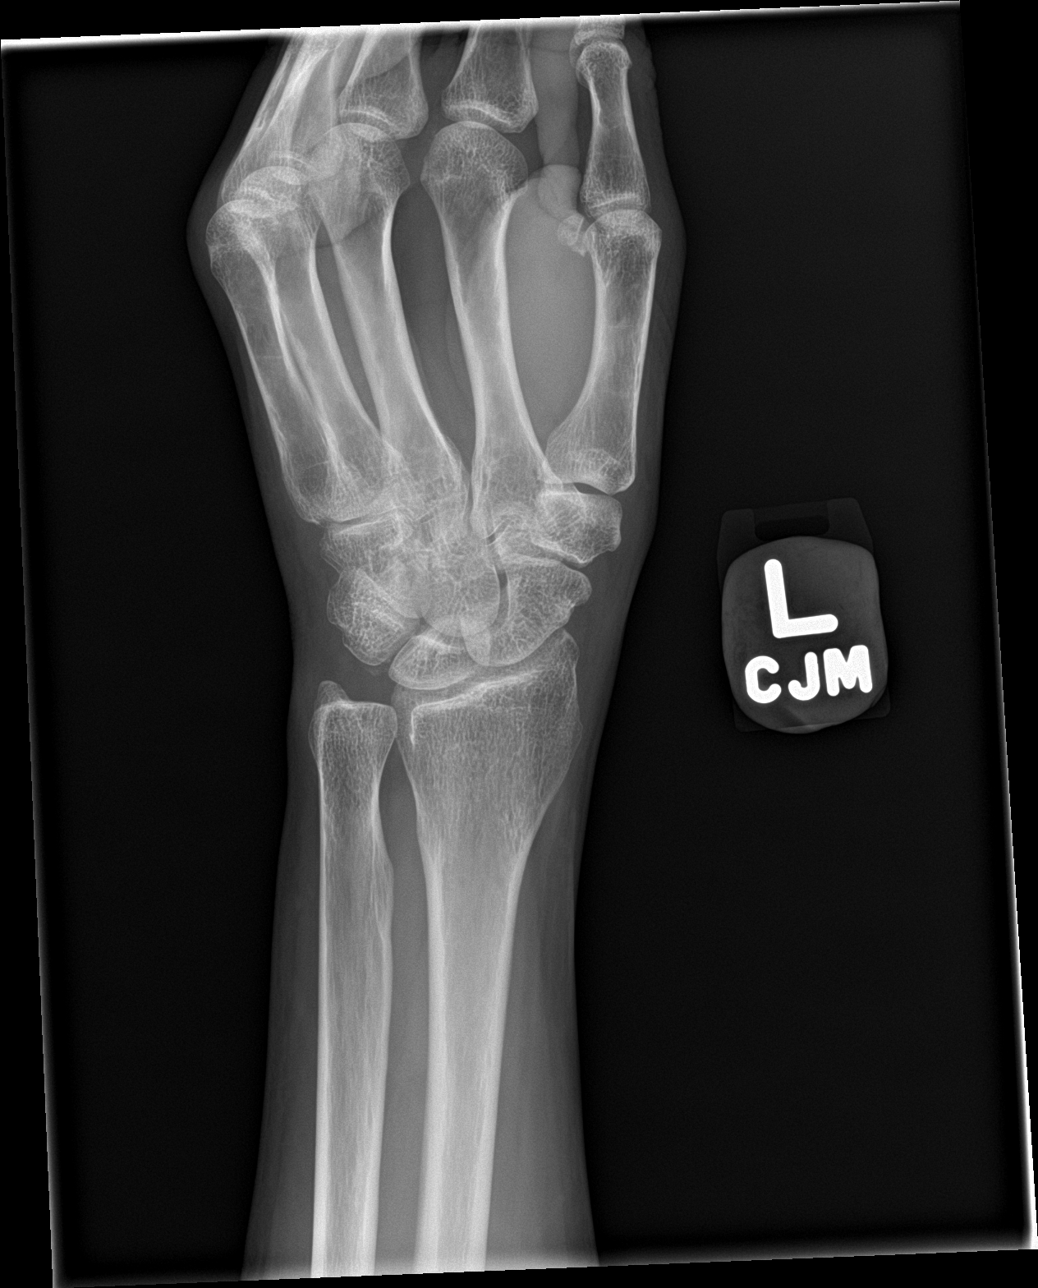

[wrist lat]
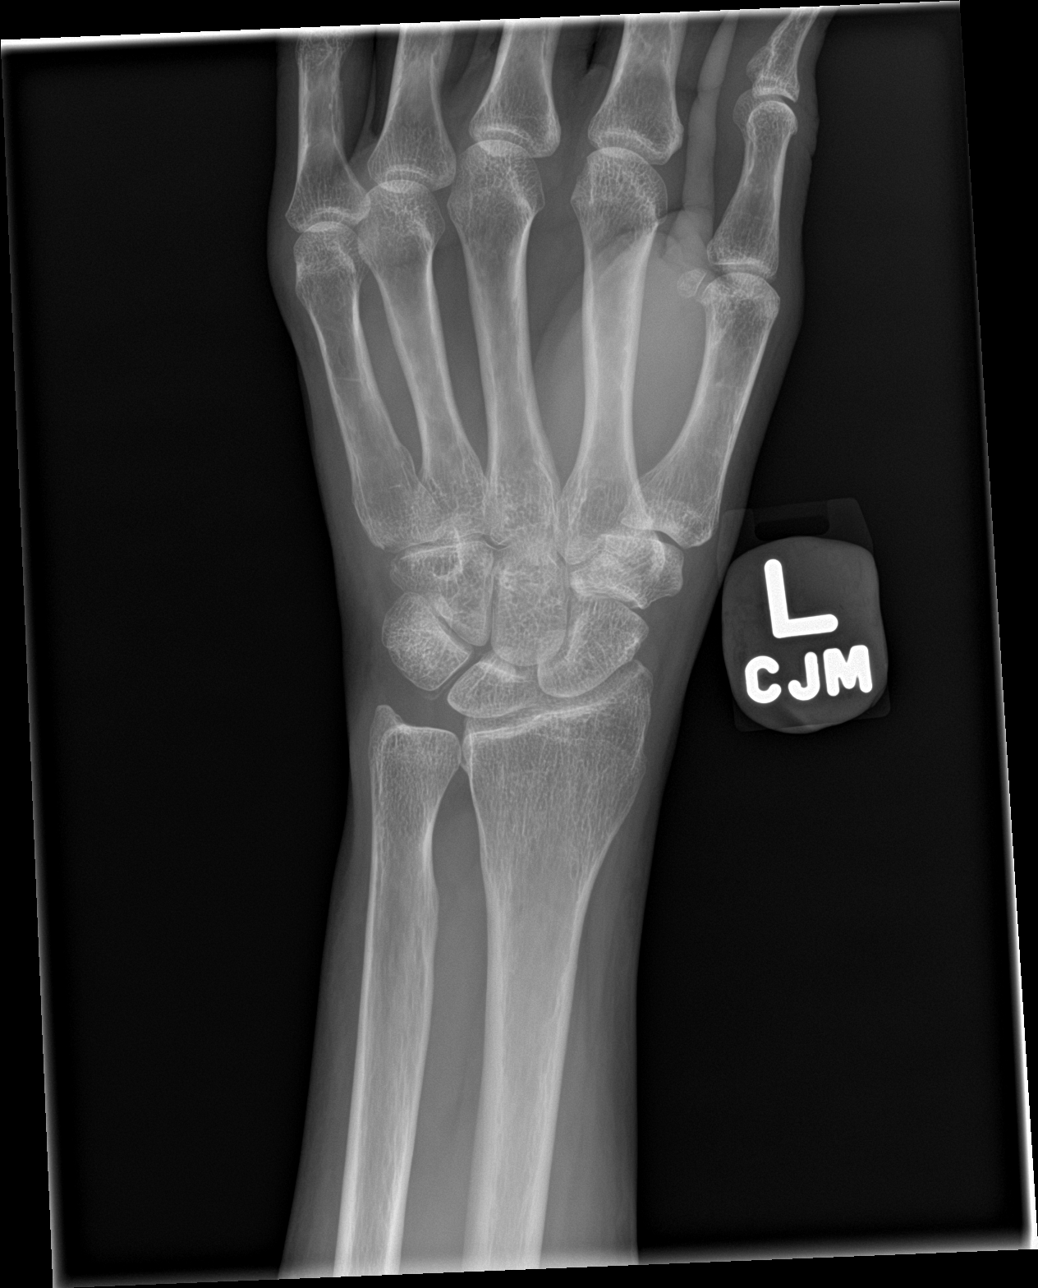

[wrist navicular]
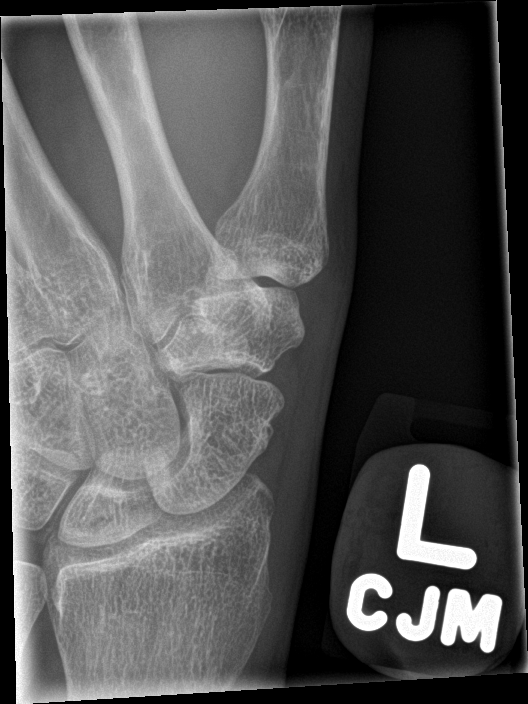

[4 of 4 positions shown; findings below may reference images not displayed]

FINDINGS: There is no evidence of fracture or dislocation. There is no
evidence of arthropathy or other focal bone abnormality. Soft
tissues are unremarkable.
IMPRESSION: Normal left wrist.

## 2015-06-20 MED ORDER — POTASSIUM CHLORIDE CRYS ER 20 MEQ PO TBCR
40.0000 meq | EXTENDED_RELEASE_TABLET | Freq: Once | ORAL | Status: AC
  Administered 2015-06-20: 40 meq via ORAL
  Filled 2015-06-20: qty 2

## 2015-06-20 MED ORDER — MORPHINE SULFATE (PF) 4 MG/ML IV SOLN
4.0000 mg | Freq: Once | INTRAVENOUS | Status: AC
  Administered 2015-06-20: 4 mg via INTRAVENOUS
  Filled 2015-06-20: qty 1

## 2015-06-20 MED ORDER — ONDANSETRON 4 MG PO TBDP
ORAL_TABLET | ORAL | Status: AC
  Filled 2015-06-20: qty 1

## 2015-06-20 MED ORDER — ONDANSETRON 4 MG PO TBDP
4.0000 mg | ORAL_TABLET | Freq: Once | ORAL | Status: AC | PRN
  Administered 2015-06-20: 4 mg via ORAL

## 2015-06-20 NOTE — ED Provider Notes (Signed)
CSN: [id number]     Arrival date & time 06/20/15  1812 History   First MD Initiated Contact with Patient 06/20/15 2125     Chief Complaint  Patient presents with  . Emesis  . Arm Pain    HPI   Kristen Bautista is a 60 y.o. female with no pertinent PMH who presents to the ED with left wrist pain and left great toe pain, which she states started after shoveling snow prior to arrival. She reports movement exacerbates her pain. She has tried icy hot for symptom relief. She also reports shortness of breath, which started today, vomiting, which has been ongoing for the past week, and epigastric abdominal pain, which started while in the ED. She denies fever, chills, diarrhea, constipation, numbness, weakness, paresthesia, hematochezia, melena, dysuria, urgency, frequency.   History reviewed. No pertinent past medical history. History reviewed. No pertinent past surgical history. History reviewed. No pertinent family history. Social History  Substance Use Topics  . Smoking status: Current Every Day Smoker    Types: Cigarettes  . Smokeless tobacco: None  . Alcohol Use: Yes   OB History    No data available      Review of Systems  Constitutional: Negative for fever and chills.  Respiratory: Positive for shortness of breath.   Cardiovascular: Negative for chest pain.  Gastrointestinal: Positive for nausea, vomiting and abdominal pain. Negative for diarrhea, constipation and blood in stool.  Genitourinary: Negative for dysuria, urgency and frequency.  Musculoskeletal: Positive for arthralgias.  Neurological: Negative for dizziness, weakness, light-headedness, numbness and headaches.  All other systems reviewed and are negative.     Allergies  Review of patient's allergies indicates no known allergies.  Home Medications   Prior to Admission medications   Medication Sig Start Date End Date Taking? Authorizing Provider  ibuprofen (ADVIL,MOTRIN) 200 MG tablet Take 400 mg by mouth every  6 (six) hours as needed for moderate pain.    Yes Historical Provider, MD    BP 125/61 mmHg  Pulse 83  Temp(Src) 98 F (36.7 C) (Oral)  Resp 21  SpO2 98% Physical Exam  Constitutional: She is oriented to person, place, and time. She appears well-developed and well-nourished. No distress.  HENT:  Head: Normocephalic and atraumatic.  Right Ear: External ear normal.  Left Ear: External ear normal.  Nose: Nose normal.  Mouth/Throat: Uvula is midline, oropharynx is clear and moist and mucous membranes are normal.  Eyes: Conjunctivae, EOM and lids are normal. Pupils are equal, round, and reactive to light. Right eye exhibits no discharge. Left eye exhibits no discharge. No scleral icterus.  Neck: Normal range of motion. Neck supple.  Cardiovascular: Normal rate, regular rhythm, normal heart sounds, intact distal pulses and normal pulses.   Pulmonary/Chest: Effort normal and breath sounds normal. No respiratory distress. She has no wheezes. She has no rales. She exhibits no tenderness.  Abdominal: Soft. Normal appearance and bowel sounds are normal. She exhibits no distension and no mass. There is tenderness. There is no rigidity, no rebound and no guarding.  TTP of epigastrium and RUQ. No rebound, guarding, or masses.  Musculoskeletal: She exhibits tenderness. She exhibits no edema.  TTP of left wrist with decreased ROM and decreased grip strength due to pain. No edema or erythema. No TTP of left great toe. No edema or erythema.  Neurological: She is alert and oriented to person, place, and time. She has normal strength. No cranial nerve deficit or sensory deficit.  Skin: Skin is warm,  dry and intact. No rash noted. She is not diaphoretic. No erythema. No pallor.  Psychiatric: She has a normal mood and affect. Her speech is normal and behavior is normal.  Nursing note and vitals reviewed.   ED Course  Procedures (including critical care time)  Labs Review Labs Reviewed  COMPREHENSIVE  METABOLIC PANEL - Abnormal; Notable for the following:    Sodium 134 (*)    Potassium 2.9 (*)    Chloride 100 (*)    CO2 19 (*)    Glucose, Bld 115 (*)    Creatinine, Ser 1.33 (*)    Calcium 8.7 (*)    Total Protein 9.0 (*)    Albumin 2.9 (*)    AST 181 (*)    ALT 110 (*)    Alkaline Phosphatase 140 (*)    Total Bilirubin 1.4 (*)    GFR calc non Af Amer 43 (*)    GFR calc Af Amer 50 (*)    All other components within normal limits  CBC - Abnormal; Notable for the following:    RBC 3.60 (*)    Hemoglobin 10.8 (*)    HCT 32.6 (*)    Platelets 99 (*)    All other components within normal limits  URINALYSIS, ROUTINE W REFLEX MICROSCOPIC (NOT AT [facility]) - Abnormal; Notable for the following:    Color, Urine AMBER (*)    APPearance HAZY (*)    Bilirubin Urine SMALL (*)    Leukocytes, UA SMALL (*)    All other components within normal limits  URINE MICROSCOPIC-ADD ON - Abnormal; Notable for the following:    Squamous Epithelial / LPF 0-5 (*)    Bacteria, UA FEW (*)    Casts HYALINE CASTS (*)    All other components within normal limits  LIPASE, BLOOD  I-STAT TROPOININ, ED    Imaging Review Dg Chest 2 View  06/20/2015  CLINICAL DATA:  60 year old female with shortness of breath EXAM: CHEST  2 VIEW COMPARISON:  None. FINDINGS: Two views of the chest do not demonstrate a focal consolidation. No pleural effusion or pneumothorax. The cardiac silhouette is within normal limits. There is degenerative changes of the distal left clavicle. No acute fracture. IMPRESSION: No active cardiopulmonary disease. Electronically Signed   By: Elgie Collard M.D.   On: 06/20/2015 22:46   Dg Wrist Complete Left  06/20/2015  CLINICAL DATA:  Left wrist pain after shoveling snow today. Initial encounter. EXAM: LEFT WRIST - COMPLETE 3+ VIEW COMPARISON:  None. FINDINGS: There is no evidence of fracture or dislocation. There is no evidence of arthropathy or other focal bone abnormality. Soft tissues are  unremarkable. IMPRESSION: Normal left wrist. Electronically Signed   By: Lupita Raider, M.D.   On: 06/20/2015 22:41   US Abdomen Limited  06/20/2015  CLINICAL DATA:  Elevated LFTs. Right upper quadrant pain today. Nausea and vomiting. EXAM: US ABDOMEN LIMITED - RIGHT UPPER QUADRANT COMPARISON:  None. FINDINGS: Gallbladder: Physiologically distended. No gallstones or wall thickening visualized. No sonographic Murphy sign noted by sonographer. Common bile duct: Diameter: 2 mm. Liver: No focal lesion identified. Within normal limits in parenchymal echogenicity. Hepatic contours are lobular. IMPRESSION: 1. Normal sonographic appearance of gallbladder. No biliary dilatation. 2. Lobular hepatic contours, raises concern for underlying cirrhosis, however echotexture appears normal. No focal hepatic abnormality. Electronically Signed   By: Rubye Oaks M.D.   On: 06/20/2015 22:53     I have personally reviewed and evaluated these images and lab results  as part of my medical decision-making.   EKG Interpretation   Date/Time:  Monday June 20 2015 18:34:14 EST Ventricular Rate:  81 PR Interval:  100 QRS Duration: 82 QT Interval:  394 QTC Calculation: 457 R Axis:   54 Text Interpretation:  Sinus rhythm with short PR Otherwise normal ECG No  old tracing to compare Confirmed by GOLDSTON  MD, SCOTT ([id number]) on 06/20/2015  9:47:40 PM      MDM   Final diagnoses:  Non-intractable vomiting with nausea, vomiting of unspecified type  Left wrist pain  Epigastric pain  Elevated liver enzymes    60 year old female presents with left wrist pain and left great toe pain. Also reports shortness of breath, which started today, vomiting, which has been ongoing for the past week, and epigastric abdominal pain, which started while in the ED. Denies fever, chills, diarrhea, constipation, numbness, weakness, paresthesia, hematochezia, melena, dysuria, urgency, frequency.  Patient is afebrile. Vital signs stable.  Heart RRR. Lungs clear to auscultation bilaterally. Abdomen soft, non-distended, with mild TTP in epigastrium and RUQ. No rebound, guarding, or masses. TTP of left wrist with decreased ROM and decreased grip strength due to pain. Sensation intact. Distal pulses intact.   EKG sinus rhythm, HR 81. Troponin negative. CBC no leukocytosis, hemoglobin 10.8. CMP remarkable for potassium 2.9 (repleted in the ED), creatinine 1.33, AST 191, ALT 110, alkaline phosphatase 140, total bili 1.4. Lipase within normal limits. UA negative for infection.  Imaging of left wrist negative for fracture, dislocation, arthropathy, focal bone abnormality, soft tissue swelling. Chest XR negative for active cardiopulmonary disease. RUQ [place] negative for gallstones or wall thickening, remarkable for lobular hepatic contours, concern for underlying cirrhosis, echotexture appears normal, no focal hepatic abnormality.  Patient discussed with Dr. Mora Bellman, who advised patient can follow-up as outpatient for further evaluation of elevated liver enzymes. Patient is non-toxic and well-appearing, feel she is stable for discharge at this time. Advised to follow-up with PCP for recheck of potassium and for further evaluation and of elevated liver enzymes. Return precautions discussed. Patient verbalizes her understanding and is in agreement with plan.    BP 125/61 mmHg  Pulse 83  Temp(Src) 98 F (36.7 C) (Oral)  Resp 21  SpO2 98%     Mady Gemma, PA-C 06/21/15 1610  Tomasita Crumble, MD 06/21/15 [phone]

## 2015-06-20 NOTE — ED Notes (Signed)
Pt reports not feeling well today, has multiple complaints. Reports not eating right and n/v for extended amount of time. Feeling lightheaded today. Was shoveling snow and onset of pain to left big toe, which resolved then pain to left hand today. Vomiting at triage.

## 2015-06-21 ENCOUNTER — Encounter (HOSPITAL_COMMUNITY): Payer: Self-pay | Admitting: Emergency Medicine

## 2015-06-21 NOTE — Discharge Instructions (Signed)
1. Medications: usual home medications 2. Treatment: rest, drink plenty of fluids, ice and elevate left wrist 3. Follow Up: please followup with your primary doctor this week for discussion of your diagnoses and further evaluation after today's visit and for recheck of your potassium and liver enzymes; if you do not have a primary care doctor use the resource guide provided to find one; please return to the ER for severe abdominal pain, persistent vomiting, new or worsening symptoms   Abdominal Pain, Adult Many things can cause abdominal pain. Usually, abdominal pain is not caused by a disease and will improve without treatment. It can often be observed and treated at home. Your health care provider will do a physical exam and possibly order blood tests and X-rays to help determine the seriousness of your pain. However, in many cases, more time must pass before a clear cause of the pain can be found. Before that point, your health care provider may not know if you need more testing or further treatment. HOME CARE INSTRUCTIONS Monitor your abdominal pain for any changes. The following actions may help to alleviate any discomfort you are experiencing:  Only take over-the-counter or prescription medicines as directed by your health care provider.  Do not take laxatives unless directed to do so by your health care provider.  Try a clear liquid diet (broth, tea, or water) as directed by your health care provider. Slowly move to a bland diet as tolerated. SEEK MEDICAL CARE IF:  You have unexplained abdominal pain.  You have abdominal pain associated with nausea or diarrhea.  You have pain when you urinate or have a bowel movement.  You experience abdominal pain that wakes you in the night.  You have abdominal pain that is worsened or improved by eating food.  You have abdominal pain that is worsened with eating fatty foods.  You have a fever. SEEK IMMEDIATE MEDICAL CARE IF:  Your pain does  not go away within 2 hours.  You keep throwing up (vomiting).  Your pain is felt only in portions of the abdomen, such as the right side or the left lower portion of the abdomen.  You pass bloody or black tarry stools. MAKE SURE YOU:  Understand these instructions.  Will watch your condition.  Will get help right away if you are not doing well or get worse.   This information is not intended to replace advice given to you by your health care provider. Make sure you discuss any questions you have with your health care provider.   Document Released: 03/07/2005 Document Revised: 02/16/2015 Document Reviewed: 02/04/2013 Elsevier Interactive Patient Education 2016 [employer]   Emergency Department Resource Guide 1) Find a Doctor and Pay Out of Pocket Although you won't have to find out who is covered by your insurance plan, it is a good idea to ask around and get recommendations. You will then need to call the office and see if the doctor you have chosen will accept you as a new patient and what types of options they offer for patients who are self-pay. Some doctors offer discounts or will set up payment plans for their patients who do not have insurance, but you will need to ask so you aren't surprised when you get to your appointment.  2) Contact Your Local Health Department Not all health departments have doctors that can see patients for sick visits, but many do, so it is worth a call to see if yours does. If you don't know  where your local health department is, you can check in your phone book. The CDC also has a tool to help you locate your state's health department, and many state websites also have listings of all of their local health departments.  3) Find a Walk-in Clinic If your illness is not likely to be very severe or complicated, you may want to try a walk in clinic. These are popping up all over the country in pharmacies, drugstores, and shopping centers. They're usually  staffed by nurse practitioners or physician assistants that have been trained to treat common illnesses and complaints. They're usually fairly quick and inexpensive. However, if you have serious medical issues or chronic medical problems, these are probably not your best option.  No Primary Care Doctor: - Call Health Connect at  [phone] - they can help you locate a primary care doctor that  accepts your insurance, provides certain services, etc. - Physician Referral Service- [phone]  Chronic Pain Problems: Organization         Address  Phone   Notes  [facility]  [phone] Patients need to be referred by their primary care doctor.   Medication Assistance: Organization         Address  Phone   Notes  [facility] Medication [facility] [address] [place], [place] [address] [phone] --Must be a resident of [facility] -- Must have NO insurance coverage whatsoever (no Medicaid/ Medicare, etc.) -- The pt. MUST have a primary care doctor that directs their care regularly and follows them in the community   [facility]  [phone]   [employer]  [phone]    Agencies that provide inexpensive medical care: Organization         Address  Phone   Notes  [facility]  [phone]   [facility]    [phone]   [facility] [address] [place], [place] [address] [phone]   [facility] [address] [place], [place] [phone]   Planned Parenthood    [phone]   [facility]    [phone]   [facility]  [address], [place] Phone:  [phone], Fax:  [phone] Hours of Operation:  9 am - 6 pm, M-F.  Also accepts Medicaid/Medicare and self-pay.  [facility] for Children  [address], [place] Phone: [phone], Fax: [phone]. Hours of  Operation:  8:30 am - 5:30 pm, M-F.  Also accepts Medicaid and self-pay.  [facility] [place] [address] [place] Phone: [phone]   [facility] [address] Natasha Bence [place], [place] [phone], Ext. [phone] Mondays & Thursdays: 7-9 AM.  First 15 patients are seen on a first come, first serve basis.    Medicaid-accepting [facility] Providers:  Organization         Address  Phone   Notes  [facility] [address], [place] [phone] Also accepts self-pay patients.  [facility] [address] Laurell Josephs [place], [place]  [phone]   [facility] [address], [place] [phone]   [facility] [address] [place] [phone]   Renaye Rakers [address], [place]   [phone] Only accepts [employer]  patients after they have their name applied to their card.   Self-Pay (no insurance) in [facility]:  Organization         Address  Phone   Notes  Sickle Cell Patients, [facility] [address] [place], [place] [phone]   [facility] Urgent Care [address] [place], [place] [phone]   [facility] [place]  [address], [place] [phone]   [facility]/Dr. Osei-Bonsu  [address], [place] or [address], [place] [phone] Phone number for both [place] and [place] locations is the same.  Urgent Medical and [facility] [address], [place] [phone]   [facility] [address] [place] or [address] [phone]   [facility] [address] [place] [phone], phone; [phone], fax Sees patients 1st and 3rd Saturday of every month.  Must not qualify for public or private insurance (i.e. Medicaid, Medicare,  [place] Health Choice, Veterans' Benefits)  Household income should be no more than 200% of the poverty level The clinic cannot treat you if you are pregnant or think you are pregnant  Sexually transmitted diseases are not treated at the clinic.    Dental Care: Organization         Address  Phone  Notes  [facility] Department of [facility] [address] [place], [place] [phone] Accepts children up to age 40 who are enrolled in [place] or [place] Health Choice; pregnant women with a Medicaid card; and children who have applied for Medicaid or [place] Health Choice, but were declined, whose parents can pay a reduced fee at time of service.  [facility] Department of [facility]  [address], [place] [phone] Accepts children up to age 5 who are enrolled in [place] or [place] Health Choice; pregnant women with a Medicaid card; and children who have applied for Medicaid or [place] Health Choice, but were declined, whose parents can pay a reduced fee at time of service.  [facility]  [address] [place], [place] [phone] Patients are seen by appointment only. Walk-ins are not accepted. [facility] will see patients 51 years of age and older. Monday - Tuesday (8am-5pm) Most Wednesdays (8:30-5pm) $30 per visit, cash only  [facility]  [address], [facility] [phone] Patients are seen by appointment only. Walk-ins are not accepted. [facility] will see patients 77 years of age and older. One Wednesday Evening (Monthly: Volunteer Based).  $30 per visit, cash only  [employer]  [phone] for adults; Children under age 61, call Graduate Pediatric Dentistry at [phone]. Children aged 66-14, please call [phone] to request a pediatric application.  Dental services are provided in all areas of dental care including fillings, crowns and bridges,  complete and partial dentures, implants, gum treatment, root canals, and extractions. Preventive care is also provided. Treatment is provided to both adults and children. Patients are selected via a lottery and there is often a waiting list.   [facility] [address], [place]  [phone] [email or web address]   [facility] [address] [place], [place] [phone], Ext. [phone] Second and Fourth Thursday of each month, opens at 6:30 AM; Clinic ends at 9 AM.  Patients are seen on a first-come first-served basis, and a limited number are seen during each clinic.   [facility]  [address] Hillard Danker [place], [place] [phone]   Eligibility Requirements You must have lived in [place], [place], or [place] counties for at least the last three months.   You cannot be eligible for state or federal sponsored [employer], including [employer], [place], or [employer].   You generally cannot be eligible for healthcare insurance through your employer.    How to apply: Eligibility screenings are held every Tuesday and Wednesday afternoon from 1:00 pm until 4:00 pm. You do not need an appointment for the interview!  [facility] [address], [place], [place]   [place]  [place] Department  [place]  [phone]    Behavioral Health Resources in the Community: Intensive Outpatient Programs Organization         Address  Phone  Notes  [place]. [address], [place], [place] [phone]   [facility] Outpatient [address], [place], [place]   ADS: [facility] [address] [place], [place]   Kathleen [address]  [place], [place] or [phone]   Substance Abuse Resources Organization          Address  Phone  Notes  [facility]  [phone]   [place]  [phone]   [facility] [place]   Chinita Pester  [phone]   Residential & Outpatient Substance Abuse Program  [phone]   Psychological Services Organization         Address  Phone  Notes  [facility] [facility] Bertie  [place]  [phone]   Vincent [address], [place] or [phone]    Mobile Crisis Teams Organization         Address  Phone  Notes  [facility], Mobile Crisis Care Unit  [phone]   [facility]  [address] [place], [place]   [place] Levels [address] [place] [phone]    Self-Help/Support Groups Organization         Address  Phone             Notes  [place]. of [place] - variety of support groups  [place] Call for more information  Narcotics Anonymous (NA), [facility] [address], [employer] [place]  2 meetings at this location   Special educational needs teacher         Address  Phone  Notes  [facility] [place],    [place]  [phone]   [facility]  [address], [place] [address], [place], [place]   [place], [place] [phone] Admissions: 8am-3pm M-F  Incentives Substance [place] [address],    [place], [place] [phone]   [facility] [address] Jadene Pierini [place], [place]   [facility] [address]  [place], [place]   [facility] - Intensive Outpatient [address] Kristeen [address], [place], [place]   [facility] ([place].) [place].,  [place], [place] or [phone]   [facility] ([facility]) [address]  Barbara Cower [place], [place] Accepts Medicaid  [facility] [place] [address]  [place] [phone] Substance Abuse/Addiction Treatment   [facility] Organization         Address  Phone  Notes  [facility]  [phone]   Domenic Schwab, PhD [address] Arlis Porta [place], [place]   [phone] or [phone]   [facility] [place], [place] [phone]   [address], [place], [place] [phone] Insurance/Medicaid/sponsorship through [facility] and Families [address] [place]                                    [place], [place] [phone] [place] [address], [place] [phone]    Dr. Adele Schilder  [phone]   [facility] Dept. 1) [address] [place] 2) [place] 3)  [address], [place] [phone]  [phone]   [place] [phone] or [phone] (After Hours)

## 2015-07-18 IMAGING — US US ABDOMEN COMPLETE W/ ELASTOGRAPHY
1 series · 13 of 14 positions shown · non-contrast
Comparison: [DATE]

CLINICAL DATA: Chronic hepatitis-C.



[Series 1: us abdomen complete w/ elastography · 0.14mm/px · 13 of 14 slices shown]
[im 1/14]
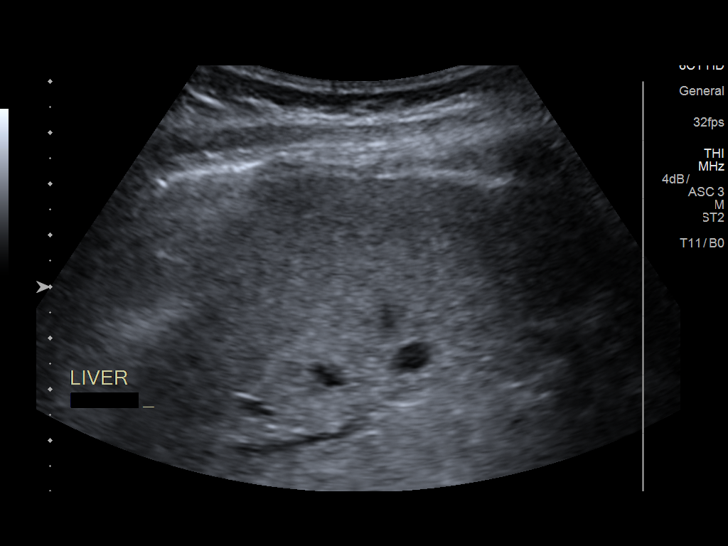
[im 2/14]
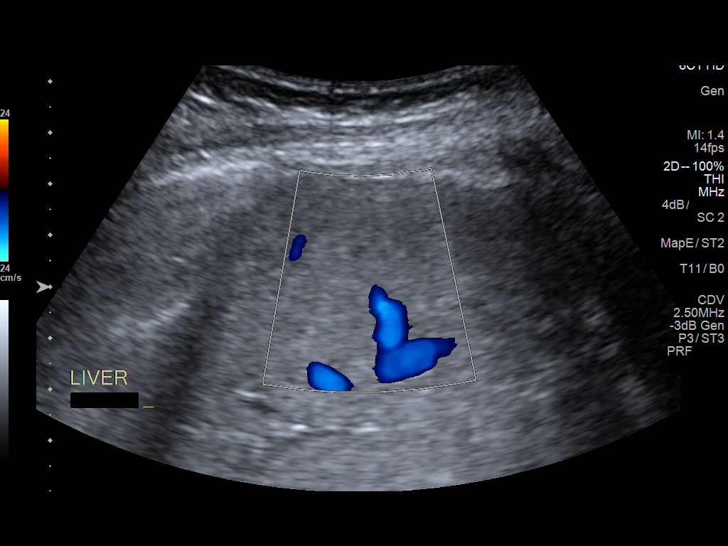
[im 3/14]
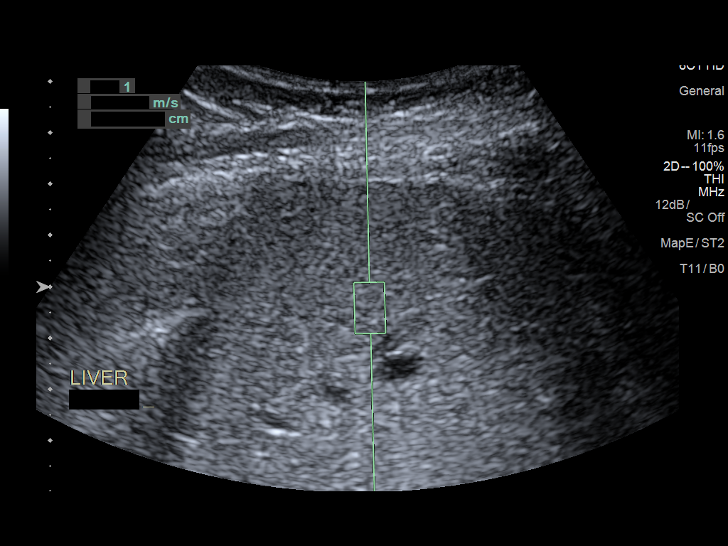
[im 4/14]
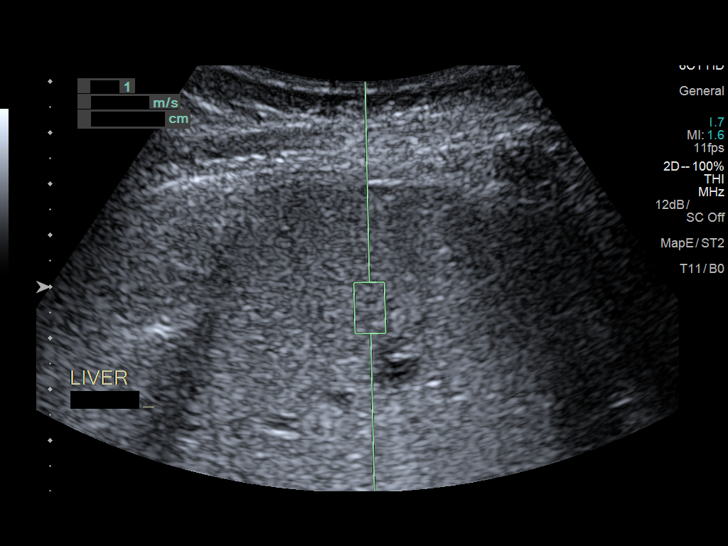
[im 5/14]
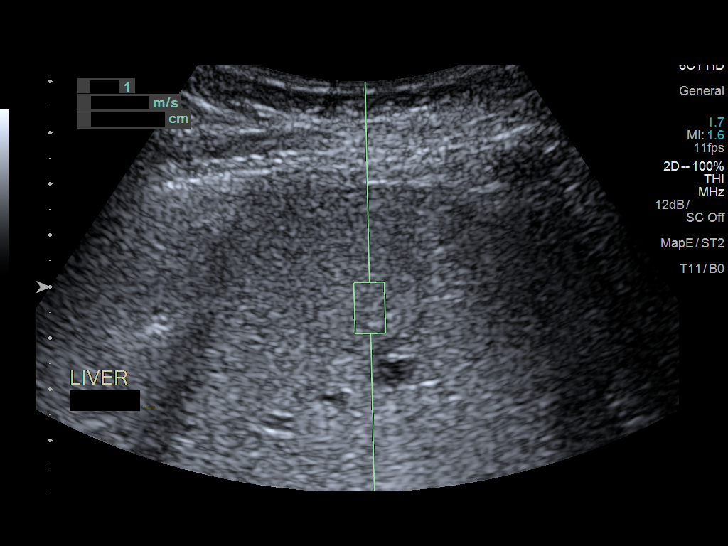
[im 6/14]
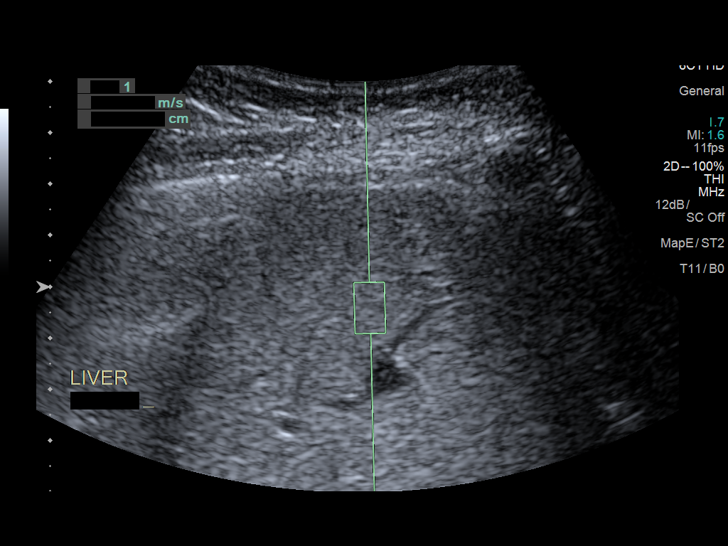
[im 8/14]
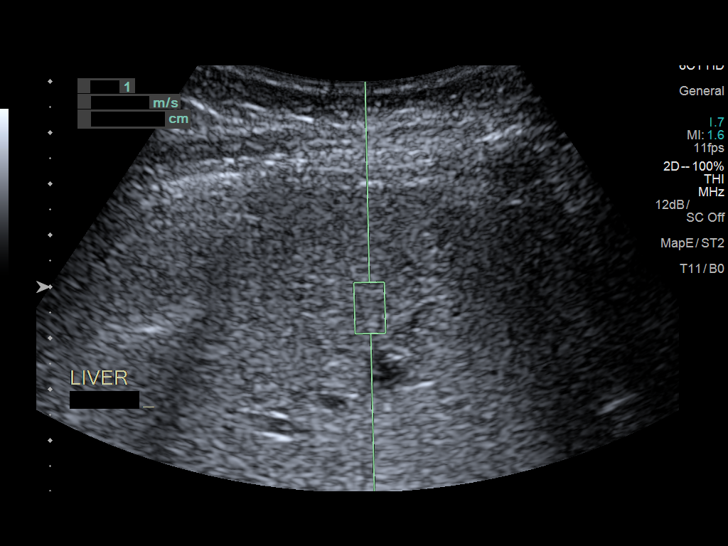
[im 9/14]
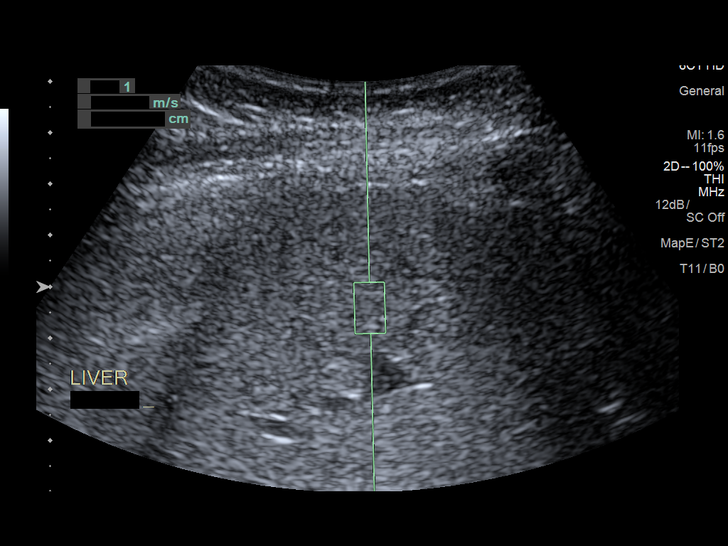
[im 10/14]
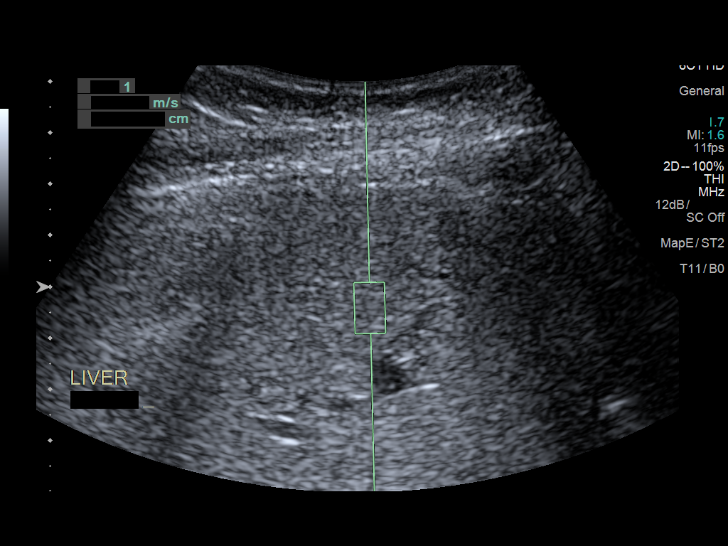
[im 11/14]
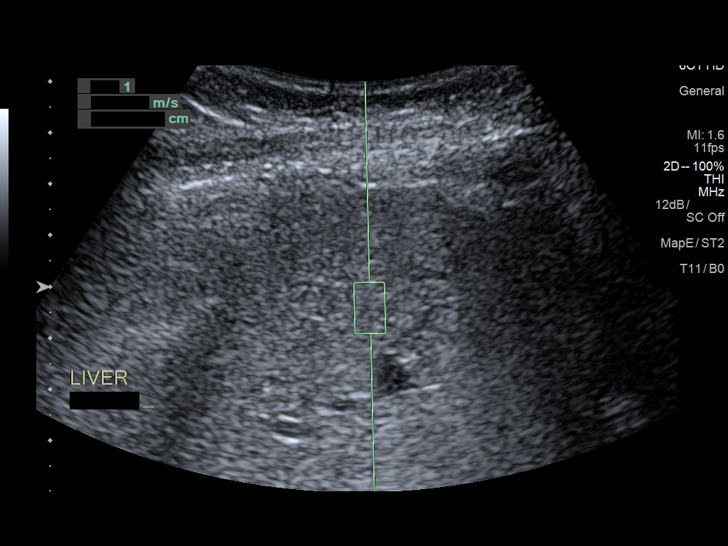
[im 12/14]
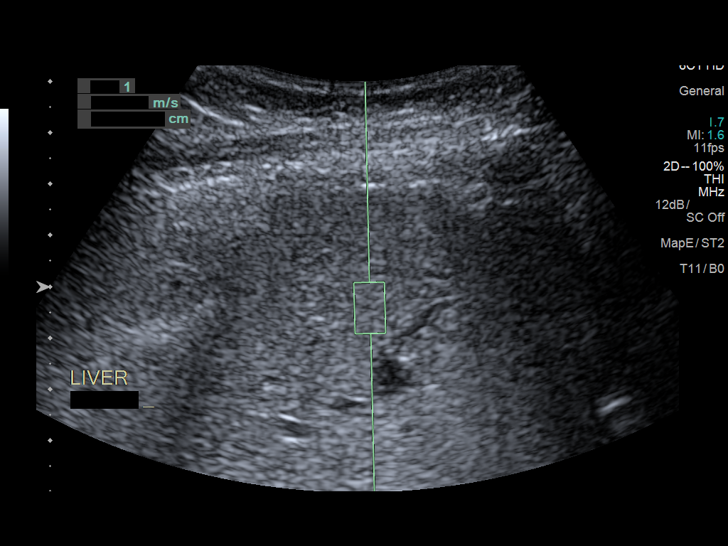
[im 13/14]
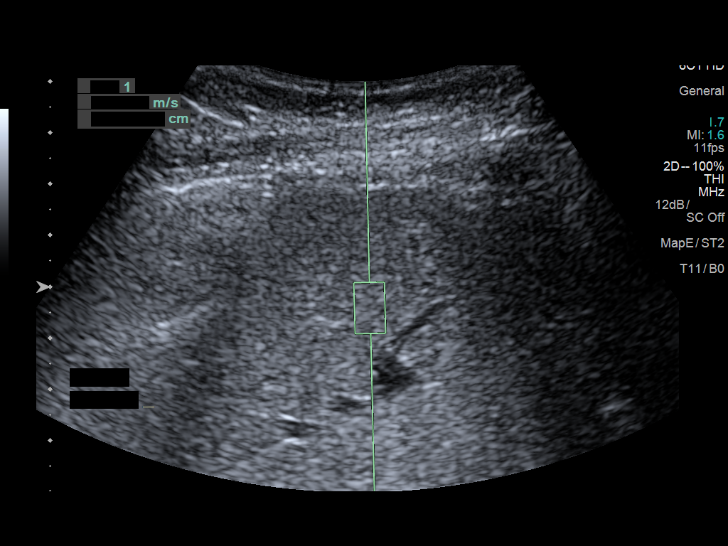
[im 14/14]
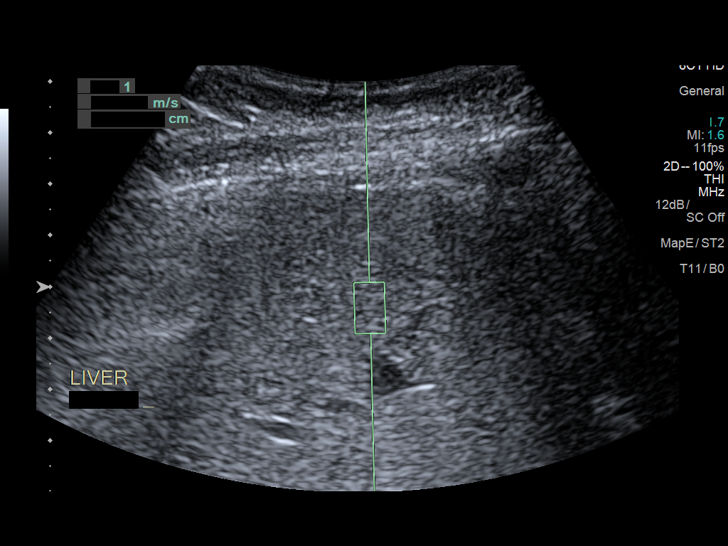

[13 of 14 positions shown; findings below may reference images not displayed]

FINDINGS: ULTRASOUND ABDOMEN

Gallbladder: Several small gallstones are again seen, largest
measuring approximately 1.1 cm. No evidence of gallbladder wall
thickening or pericholecystic fluid. No sonographic Murphy sign
noted by sonographer.

Common bile duct: Diameter: 4 mm, within normal limits.

Liver: Mildly increased echogenicity and mild nodularity of the
capsular contour, highly suspicious for hepatic cirrhosis . No liver
mass identified. Portal vein is patent. No evidence of ascites.

IVC: No abnormality visualized.

Pancreas: Limited visualization, however portion unremarkable.

Spleen: Size and appearance within normal limits.

Right Kidney: Length: 10.3 cm. Echogenicity within normal limits. No
mass or hydronephrosis visualized.

Left Kidney: Length: 11.0 cm. Echogenicity within normal limits. No
mass or hydronephrosis visualized.

Abdominal aorta: No aneurysm visualized.

Other findings: None.

ULTRASOUND HEPATIC ELASTOGRAPHY

Device: Siemens Helix VTQ

Patient position:  Left Lateral Decubitus

Transducer [id number]

Number of measurements:  10

Hepatic Segment:  8

Median velocity:   3.40  m/sec

IQR:

IQR/Median velocity ratio

Corresponding Metavir fibrosis score:  Some F3 + F4

Risk of fibrosis: High

Limitations of exam: None

Pertinent findings noted on other imaging exams:  None

Please note that abnormal shear wave velocities may also be
identified in clinical settings other than with hepatic fibrosis,
such as: acute hepatitis, elevated right heart and central venous
pressures including use of beta blockers, MICHALSKI disease
(MICHALSKI), infiltrative processes such as
mastocytosis/amyloidosis/infiltrative tumor, extrahepatic
cholestasis, in the post-prandial state, and liver transplantation.
Correlation with patient history, laboratory data, and clinical
condition recommended.
IMPRESSION: Cholelithiasis. No sonographic signs of cholecystitis or biliary
dilatation.

Sonographic findings suspicious for hepatic cirrhosis. No liver mass
visualized.

Median hepatic shear wave velocity is calculated at 3.40 m/sec.

Corresponding Metavir fibrosis score is Some F3 + F4.

Risk of fibrosis is high.

Follow-up:  Followup Advised

## 2015-07-29 ENCOUNTER — Other Ambulatory Visit: Payer: Self-pay | Admitting: Gastroenterology

## 2015-07-29 DIAGNOSIS — R7989 Other specified abnormal findings of blood chemistry: Secondary | ICD-10-CM

## 2015-07-29 DIAGNOSIS — R945 Abnormal results of liver function studies: Principal | ICD-10-CM

## 2015-08-10 ENCOUNTER — Ambulatory Visit
Admission: RE | Admit: 2015-08-10 | Discharge: 2015-08-10 | Disposition: A | Discharge status: Home / Self Care | Payer: BC Managed Care – PPO | Source: Ambulatory Visit | Attending: Gastroenterology | Admitting: Gastroenterology

## 2015-08-10 DIAGNOSIS — R7989 Other specified abnormal findings of blood chemistry: Secondary | ICD-10-CM

## 2015-08-10 DIAGNOSIS — R945 Abnormal results of liver function studies: Principal | ICD-10-CM

## 2015-08-10 IMAGING — US US ABDOMEN COMPLETE
1 series · 13 of 25 positions shown · non-contrast
Comparison: Ultrasound of the abdomen of [DATE]

CLINICAL DATA: Elevated liver function tests

EXAM:
ABDOMEN ULTRASOUND COMPLETE

[Series 1: us abdomen complete · 0.28mm/px · 13 of 96 slices shown]
[im 1/96]
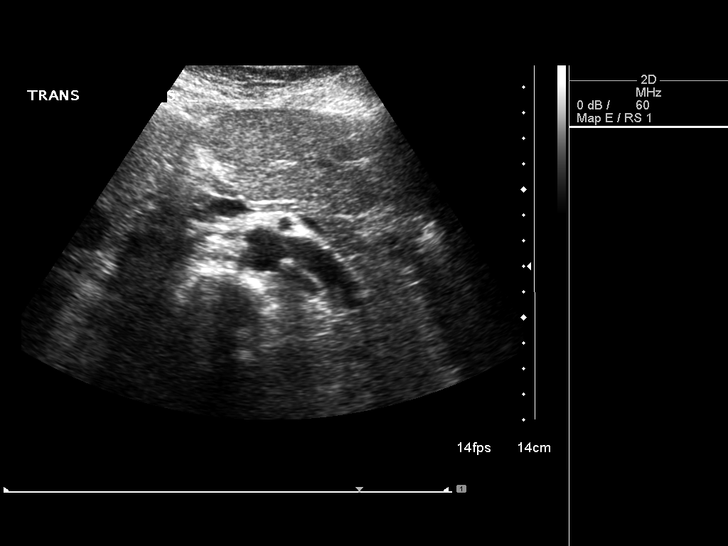
[im 8/96]
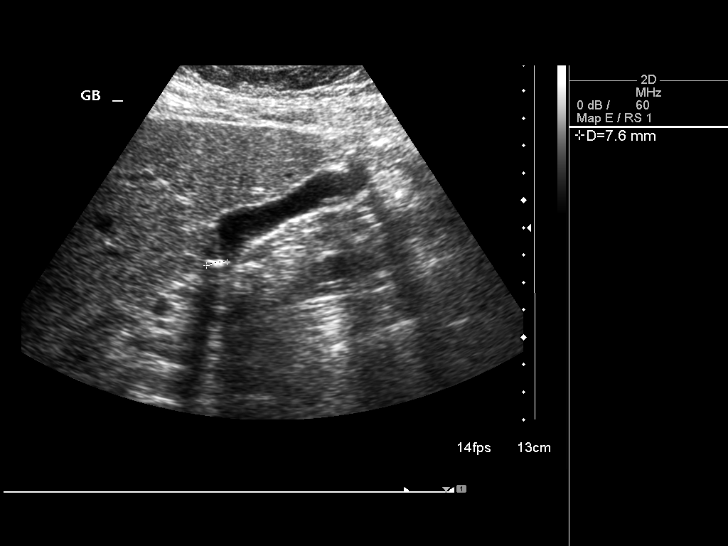
[im 16/96]
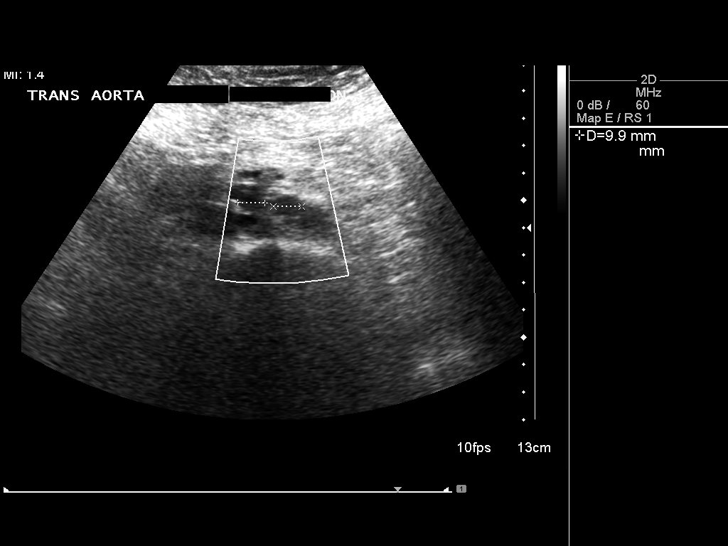
[im 24/96]
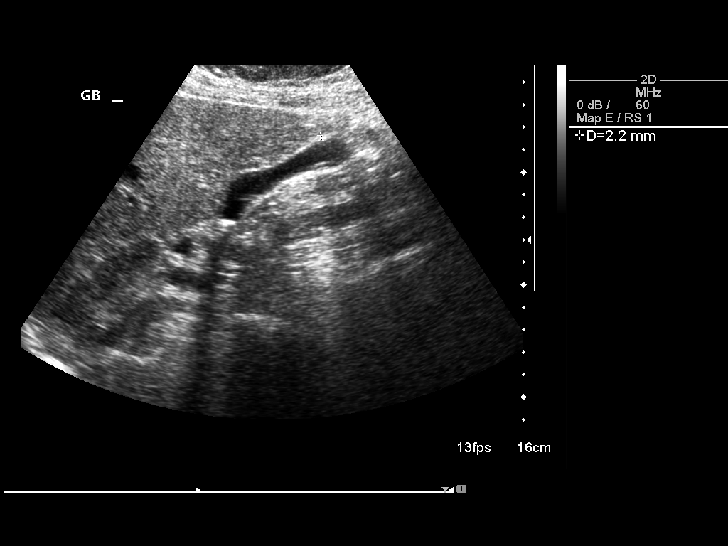
[im 32/96]
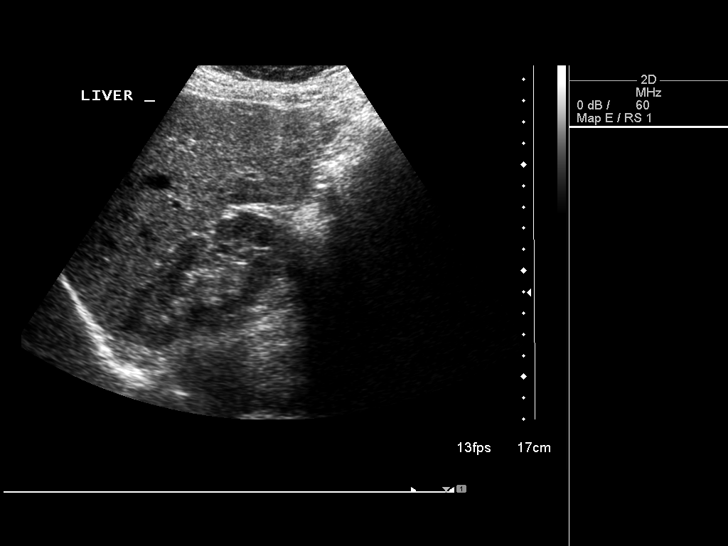
[im 40/96]
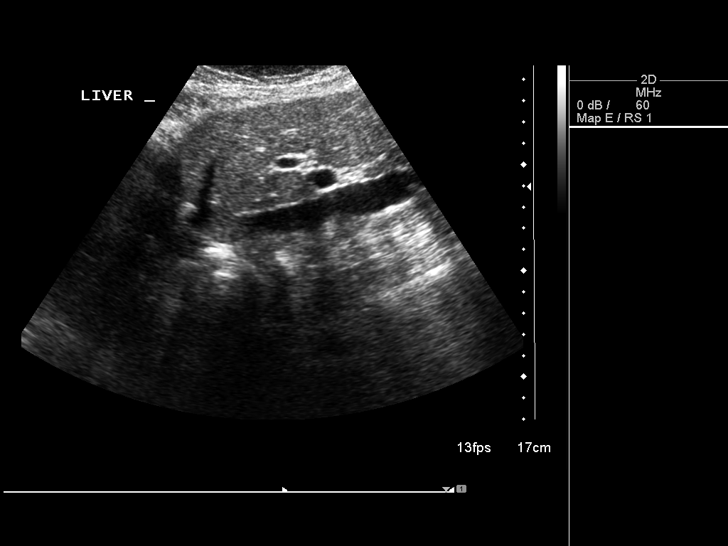
[im 48/96]
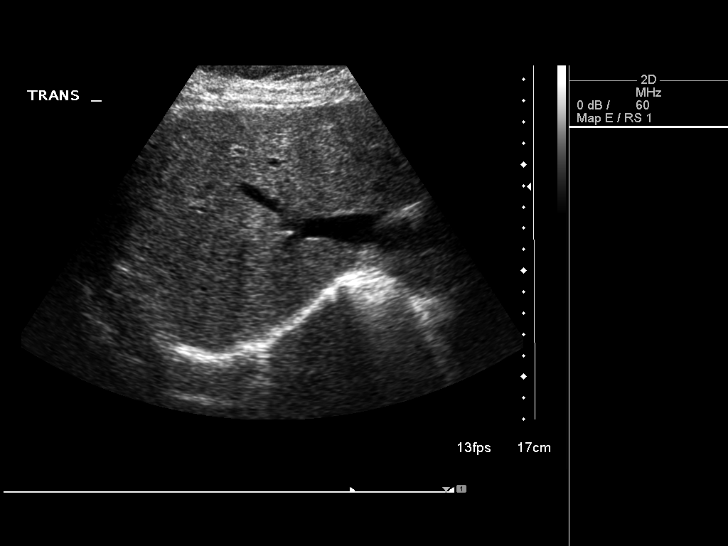
[im 56/96]
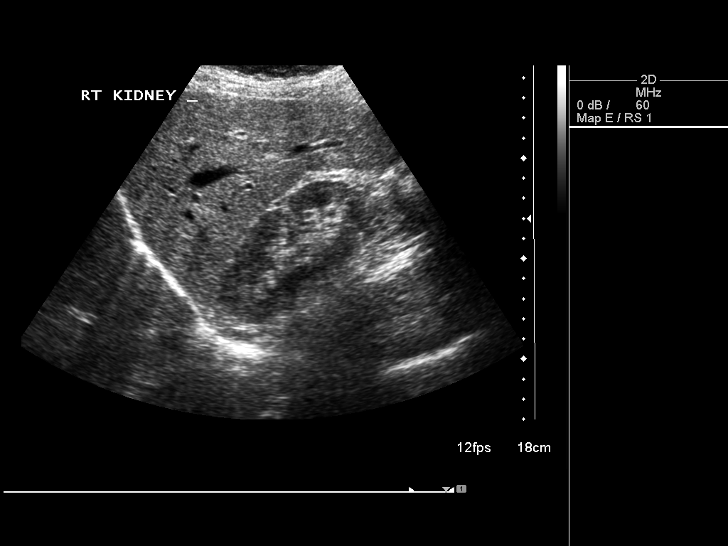
[im 64/96]
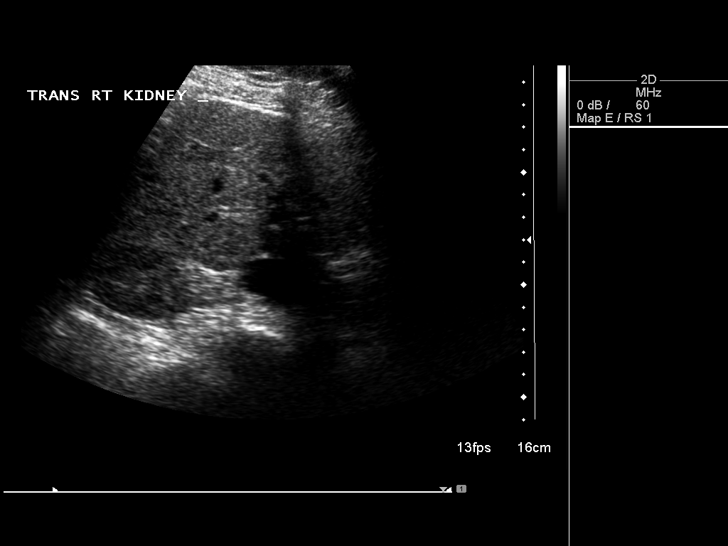
[im 72/96]
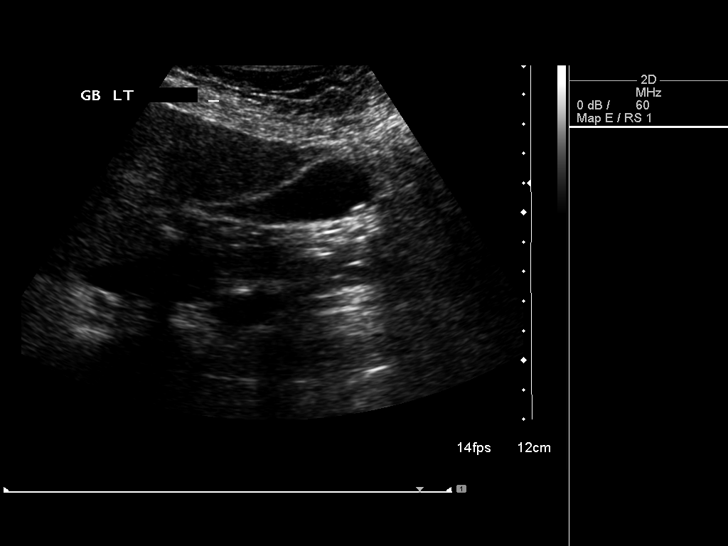
[im 80/96]
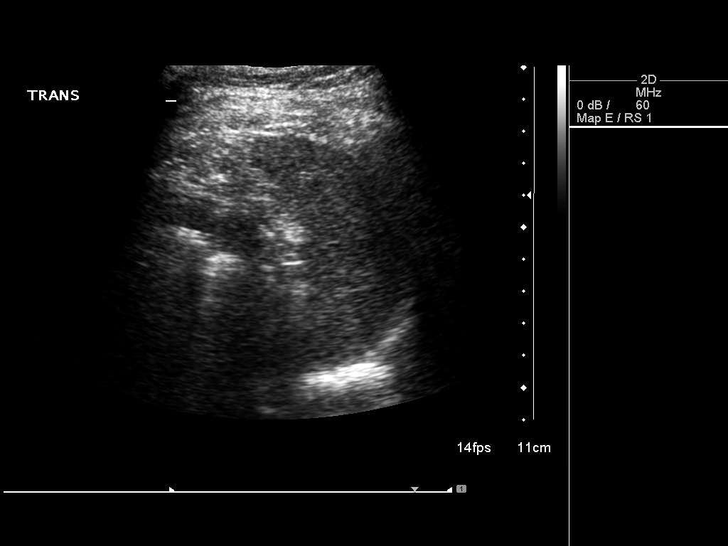
[im 88/96]
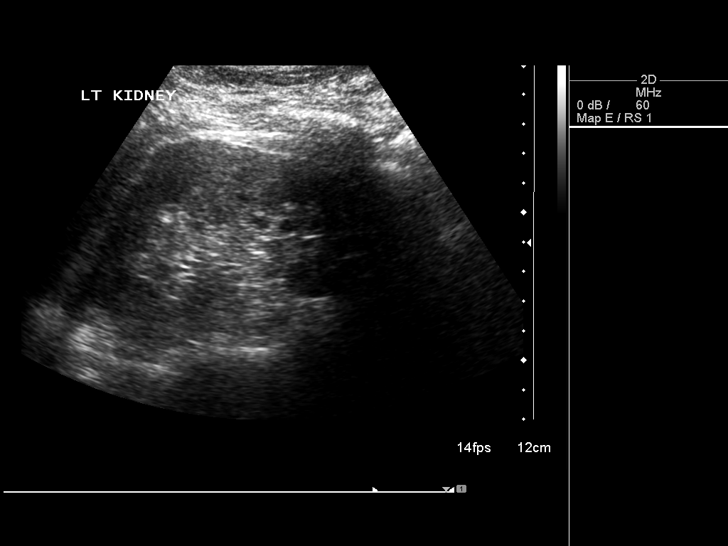
[im 96/96]
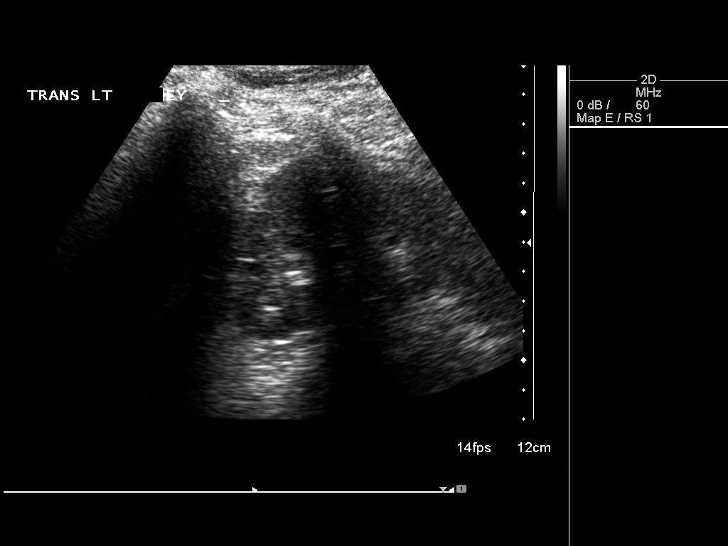

[13 of 25 positions shown; findings below may reference images not displayed]

FINDINGS: Gallbladder: The gallbladder is visualized and small gallstones are
present, the largest of 8 mm in diameter. There is no pain over the
gallbladder with compression.

Common bile duct: Diameter: The common bile duct is within normal
limits measuring up to 6.2 mm in diameter distally.

Liver: The liver has a relatively normal echogenic pattern. No
definite hepatic abnormality is seen. The echogenicity of the liver
parenchyma is within normal limits. The technologist who performed
the study felt that there was slight prominence of the central
intrahepatic ducts. CT of the abdomen with oral and IV contrast may
be helpful to assess further.

IVC: No abnormality visualized.

Pancreas: The midportion of the pancreas appears normal with
portions of the head and tail obscured by bowel gas.

Spleen: The spleen is normal measuring 3.6 cm.

Right Kidney: Length: 9.8 cm..  No hydronephrosis is seen.

Left Kidney: Length: 10.7 cm.. No hydronephrosis is noted. A small
echogenicity in the upper pole may represent a nonobstructing
calculus.

Abdominal aorta: The abdominal aorta is normal in caliber with
moderate atherosclerotic change.

Other findings: None.
IMPRESSION: 1. Gallstones, the largest of 8 mm in diameter. There is no pain
over the gallbladder with compression.
2. No definite abnormality of the liver although the technologist
who performed the study felt that there may be slight prominence of
intrahepatic ducts. CT of the abdomen may be helpful with oral and
IV contrast to assess further.
3. Portions of pancreas are obscured by bowel gas.
4. Question small nonobstructing left upper pole renal calculus.

## 2015-08-25 ENCOUNTER — Other Ambulatory Visit (HOSPITAL_COMMUNITY): Payer: Self-pay | Admitting: Gastroenterology

## 2015-08-25 DIAGNOSIS — B1920 Unspecified viral hepatitis C without hepatic coma: Secondary | ICD-10-CM

## 2015-09-15 ENCOUNTER — Ambulatory Visit (HOSPITAL_COMMUNITY)
Admission: RE | Admit: 2015-09-15 | Discharge: 2015-09-15 | Disposition: A | Discharge status: Home / Self Care | Payer: BC Managed Care – PPO | Source: Ambulatory Visit | Attending: Gastroenterology | Admitting: Gastroenterology

## 2015-09-15 DIAGNOSIS — B1920 Unspecified viral hepatitis C without hepatic coma: Secondary | ICD-10-CM | POA: Diagnosis not present

## 2015-09-15 DIAGNOSIS — K8020 Calculus of gallbladder without cholecystitis without obstruction: Secondary | ICD-10-CM | POA: Insufficient documentation

## 2016-03-29 ENCOUNTER — Ambulatory Visit: Payer: Self-pay | Admitting: Surgery

## 2016-03-29 NOTE — H&P (Signed)
Crist FatSandra A. Manson PasseyBrown 03/29/2016 8:44 AM Location: [facility] Patient #: [id number] DOB: 13-Dec-1955 Divorced / Language: [place] / Race: Black or African American Female  History of Present Illness (Keyden Pavlov A. Fredricka Bonineonnor MD; 03/29/2016 9:05 AM) Patient words: This is a pleasant 60 year old woman, hepatitis C positive on Harvoni, who presents with a slowly enlarging sebaceous cyst of the left neck. It is not painful and it has never been infected, she's never been able to drain anything from it. It has been present for 7 or 8 years. She has not had any symptoms of fevers, night sweats, dysphagia, voice change or pain. She has had weight loss recently but attributes this to her poor dentition, for which she recently had all of her teeth removed.  She also notes a soft mass on the anterior aspect of her left biceps which has also been present for several years. She was told that she may have had some trauma to the region; she doesn't know if it is increased in size. This is also painless. She has no resections and range of motion and no overlying skin changes.  The patient is a 60 year old female.   Other Problems Fay Records(Ashley Beck, CMA; 03/29/2016 8:44 AM) Back Pain Cholelithiasis Hepatitis  Past Surgical History Fay Records(Ashley Beck, CMA; 03/29/2016 8:44 AM) No pertinent past surgical history  Diagnostic Studies History Fay Records(Ashley Beck, CMA; 03/29/2016 8:44 AM) Colonoscopy never Mammogram within last year Pap Smear 1-5 years ago  Allergies Fay Records(Ashley Beck, CMA; 03/29/2016 8:44 AM) No Known Drug Allergies 03/29/2016  Medication History Fay Records(Ashley Beck, CMA; 03/29/2016 8:44 AM) No Current Medications Medications Reconciled  Social History Fay Records(Ashley Beck, CMA; 03/29/2016 8:44 AM) Alcohol use Moderate alcohol use. Caffeine use Coffee. Illicit drug use Prefer to discuss with provider. Tobacco use Current every day smoker.  Family History Fay Records(Ashley Beck, [place]; 03/29/2016 8:44 AM) Alcohol  Abuse Father, Mother. Cerebrovascular Accident Family Members In General. Hypertension Mother.  Pregnancy / Birth History Fay Records(Ashley Beck, CMA; 03/29/2016 8:44 AM) Age at menarche 14 years. Age of menopause 1856-61 Contraceptive History Oral contraceptives. Gravida 4 Irregular periods Maternal age <15 Para 3     Review of Systems Fay Records(Ashley Beck CMA; 03/29/2016 8:44 AM) General Present- Appetite Loss and Weight Loss. Not Present- Chills, Fatigue, Fever, Night Sweats and Weight Gain. Skin Not Present- Change in Wart/Mole, Dryness, Hives, Jaundice, New Lesions, Non-Healing Wounds, Rash and Ulcer. HEENT Not Present- Earache, Hearing Loss, Hoarseness, Nose Bleed, Oral Ulcers, Ringing in the Ears, Seasonal Allergies, Sinus Pain, Sore Throat, Visual Disturbances, Wears glasses/contact lenses and Yellow Eyes. Respiratory Not Present- Bloody sputum, Chronic Cough, Difficulty Breathing, Snoring and Wheezing. Breast Not Present- Breast Mass, Breast Pain, Nipple Discharge and Skin Changes. Cardiovascular Not Present- Chest Pain, Difficulty Breathing Lying Down, Leg Cramps, Palpitations, Rapid Heart Rate, Shortness of Breath and Swelling of Extremities. Gastrointestinal Present- Change in Bowel Habits. Not Present- Abdominal Pain, Bloating, Bloody Stool, Chronic diarrhea, Constipation, Difficulty Swallowing, Excessive gas, Gets full quickly at meals, Hemorrhoids, Indigestion, Nausea, Rectal Pain and Vomiting. Female Genitourinary Not Present- Frequency, Nocturia, Painful Urination, Pelvic Pain and Urgency. Musculoskeletal Present- Back Pain. Not Present- Joint Pain, Joint Stiffness, Muscle Pain, Muscle Weakness and Swelling of Extremities. Neurological Not Present- Decreased Memory, Fainting, Headaches, Numbness, Seizures, Tingling, Tremor, Trouble walking and Weakness. Psychiatric Not Present- Anxiety, Bipolar, Change in Sleep Pattern, Depression, Fearful and Frequent crying. Endocrine Present-  Hot flashes. Not Present- Cold Intolerance, Excessive Hunger, Hair Changes, Heat Intolerance and New Diabetes. Hematology Not Present- Blood Thinners, Easy Bruising,  Excessive bleeding, Gland problems, HIV and Persistent Infections.  Vitals Fay Records CMA; 03/29/2016 8:45 AM) 03/29/2016 8:44 AM Weight: 127 lb Height: 67in Body Surface Area: 1.67 m Body Mass Index: 19.89 kg/m  Temp.: 98.16F(Temporal)  Pulse: 80 (Regular)  BP: 130/74 (Sitting, Left Arm, Standard)      Physical Exam (Geneen Dieter A. Fredricka Bonine MD; 03/29/2016 9:08 AM)  The physical exam findings are as follows: Note:AOx3, no distress Anicteric; EOMI, PERRL. MMM, edentulous neck with 1cm mobile cystic mass within the skin overlying the SCM on the left. central punctum without active drainage. There is no lymphadenopathy, no thyromegaly, no other masses. Unlabored/CTAB, RRR Abdomen soft, nontender, no organomegaly Ext WWP. On the anterior aspect of the left biceps there is a vague soft, mobile and nontender mass consistent with lipoma. Normal pulses, no neurologic deficits. neuro grossly intact psych normal mood and affect    Assessment & Plan (Atia Haupt A. Ganesh Deeg MD; 03/29/2016 9:03 AM)  SEBACEOUS CYST (Principal Diagnosis) (L72.3) Story: Slowly growing, on left neck. Plan for local excision.  Current Plans You are being scheduled for surgery - Our schedulers will call you.  You should hear from our office's scheduling department within 5 working days about the location, date, and time of surgery. We try to make accommodations for patient's preferences in scheduling surgery, but sometimes the OR schedule or the surgeon's schedule prevents [place] from making those accommodations.  If you have not heard from our office [phone]) in 5 working days, call the office and ask for your surgeon's nurse.  If you have other questions about your diagnosis, plan, or surgery, call the office and ask for your surgeon's  nurse.  SOFT TISSUE MASS (Working Diagnosis) (M79.9) Story: Soft, mobile subcutaneous mass at left anterior biceps- likely lipoma. Plan for local excision.

## 2016-05-01 ENCOUNTER — Encounter (HOSPITAL_COMMUNITY): Admission: RE | Payer: Self-pay | Source: Ambulatory Visit

## 2016-05-01 ENCOUNTER — Ambulatory Visit (HOSPITAL_COMMUNITY): Admission: RE | Admit: 2016-05-01 | Payer: BC Managed Care – PPO | Source: Ambulatory Visit | Admitting: Surgery

## 2016-05-01 SURGERY — EXCISION, CYST, NECK
Anesthesia: Choice | Laterality: Left

## 2016-05-21 ENCOUNTER — Other Ambulatory Visit: Payer: Self-pay | Admitting: Family Medicine

## 2016-05-21 DIAGNOSIS — Z1231 Encounter for screening mammogram for malignant neoplasm of breast: Secondary | ICD-10-CM

## 2016-06-15 ENCOUNTER — Ambulatory Visit
Admission: RE | Admit: 2016-06-15 | Discharge: 2016-06-15 | Disposition: A | Discharge status: Home / Self Care | Payer: BC Managed Care – PPO | Source: Ambulatory Visit | Attending: Family Medicine | Admitting: Family Medicine

## 2016-06-15 DIAGNOSIS — Z1231 Encounter for screening mammogram for malignant neoplasm of breast: Secondary | ICD-10-CM

## 2017-06-21 ENCOUNTER — Other Ambulatory Visit: Payer: Self-pay | Admitting: Occupational Medicine

## 2017-06-21 ENCOUNTER — Ambulatory Visit: Payer: Self-pay

## 2017-06-21 DIAGNOSIS — M79671 Pain in right foot: Secondary | ICD-10-CM

## 2017-06-21 IMAGING — DX DG FOOT COMPLETE 3+V*R*
3 series · 3 of 3 positions shown · non-contrast
Comparison: None.

CLINICAL DATA: Foot injury with pain.

EXAM:
RIGHT FOOT COMPLETE - 3+ VIEW

[foot ap]
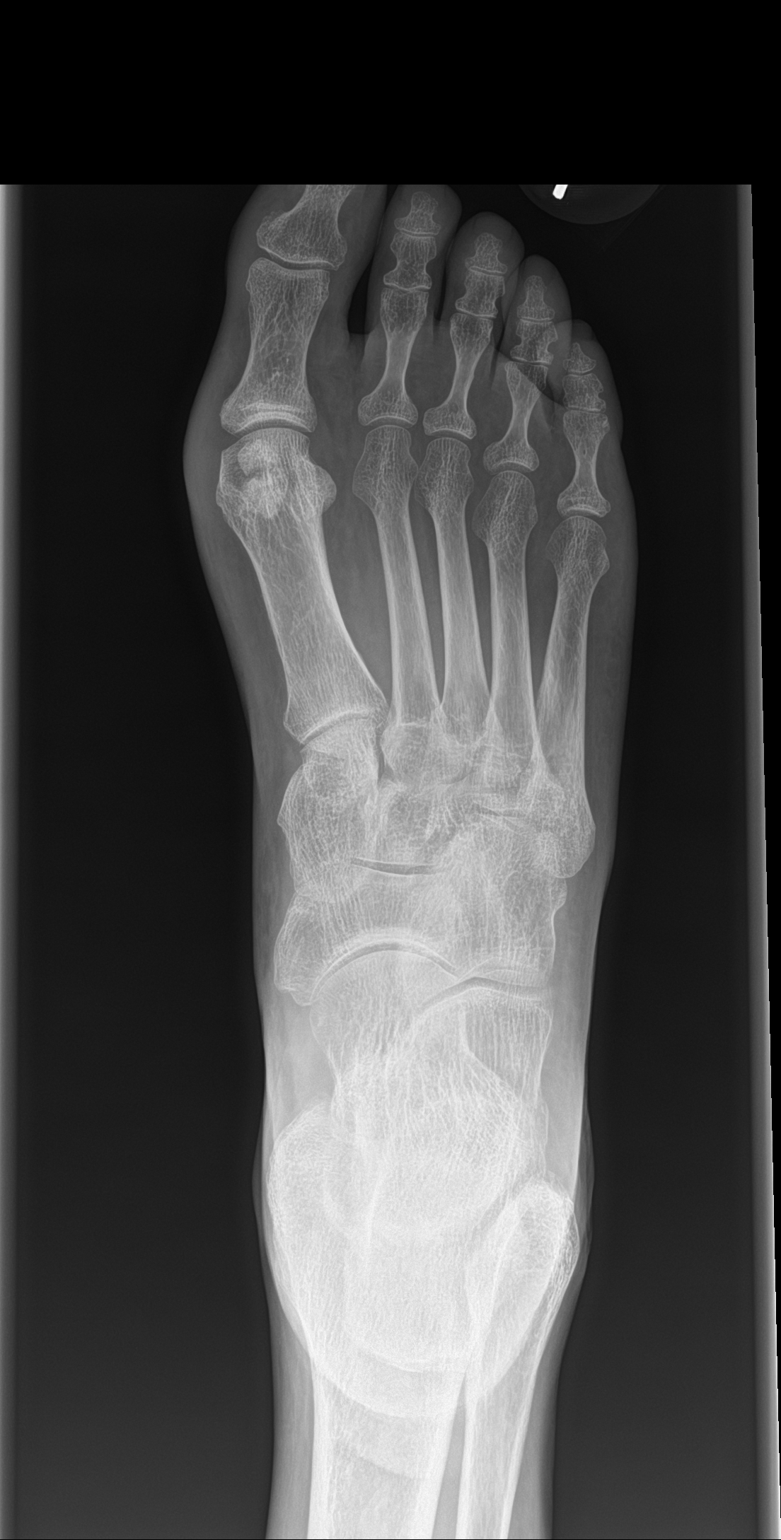

[foot obl]
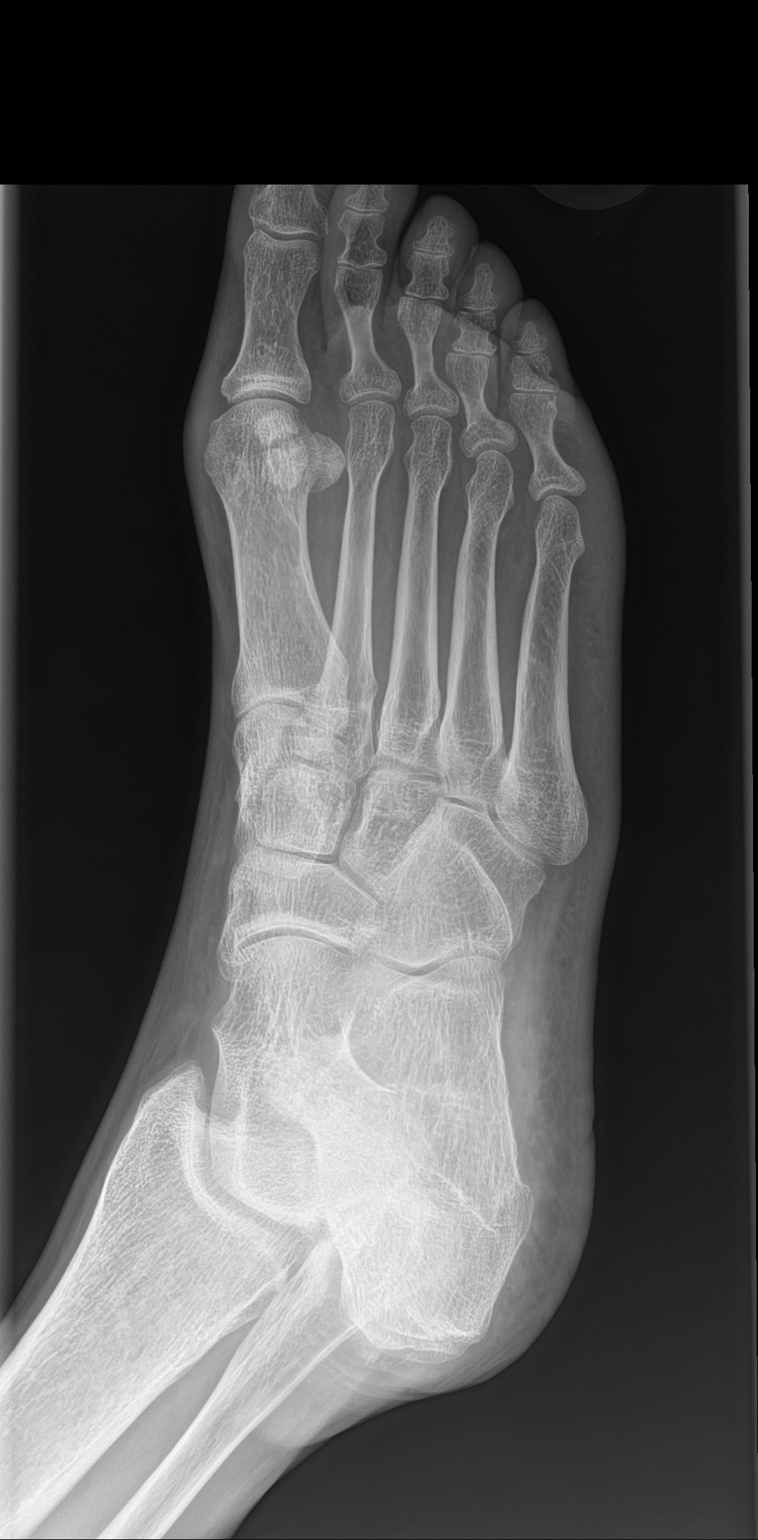

[foot lat]
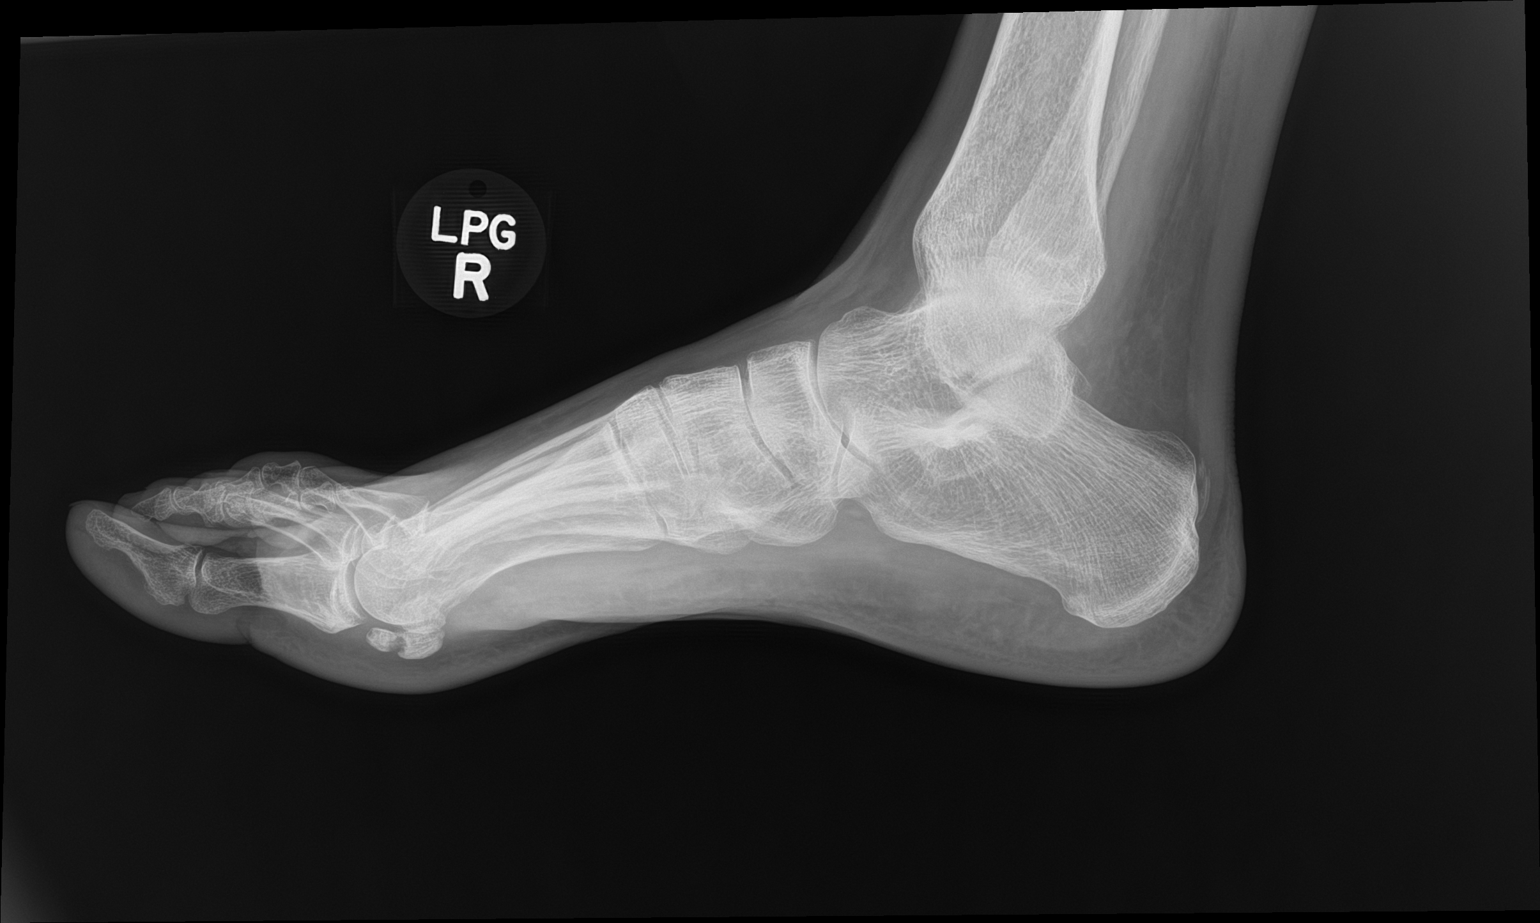

[3 of 3 positions shown; findings below may reference images not displayed]

FINDINGS: There is no evidence of fracture or dislocation. There is no
evidence of arthropathy or other focal bone abnormality. Mild hallux
valgus deformity. Soft tissues are unremarkable.
IMPRESSION: Negative.

## 2017-06-26 ENCOUNTER — Other Ambulatory Visit: Payer: Self-pay

## 2017-06-26 ENCOUNTER — Encounter (HOSPITAL_COMMUNITY): Payer: Self-pay | Admitting: Emergency Medicine

## 2017-06-26 ENCOUNTER — Ambulatory Visit (HOSPITAL_COMMUNITY)
Admission: EM | Admit: 2017-06-26 | Discharge: 2017-06-26 | Disposition: A | Discharge status: Home / Self Care | Payer: Worker's Compensation | Attending: Family Medicine | Admitting: Family Medicine

## 2017-06-26 DIAGNOSIS — M79604 Pain in right leg: Secondary | ICD-10-CM

## 2017-06-26 DIAGNOSIS — M25511 Pain in right shoulder: Secondary | ICD-10-CM

## 2017-06-26 MED ORDER — DICLOFENAC SODIUM 75 MG PO TBEC: 75.0000 mg | DELAYED_RELEASE_TABLET | Freq: Two times a day (BID) | ORAL | 0 refills | Status: AC

## 2017-06-26 NOTE — ED Provider Notes (Signed)
[facility]    CSN: [id number] Arrival date & time: 06/26/17  1758     History   Chief Complaint Chief Complaint  Patient presents with  . Leg Pain    HPI Kristen Bautista is a 62 y.o. female no significant PMH presenting with right sided pain. 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 She initially hurt her right foot which has previously been x-rayed and revealed no acute fractures. Pain in foot still present, but has since developed right sided soreness along her right leg and right shoulder/chest. This pain has been present for 1-2 weeks. 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 No second injury since initial fall. No numbness or tingling. No difficulty with speech. No headache.  Denies history of high blood pressure.  HPI  History reviewed. No pertinent past medical history.  There are no active problems to display for this patient.   History reviewed. No pertinent surgical history.  OB History    Gravida Para Term Preterm AB Living   0 0 0 0 0     SAB TAB Ectopic Multiple Live Births   0 0 0           Home Medications    Prior to Admission medications   Medication Sig Start Date End Date Taking? Authorizing Provider  diclofenac (VOLTAREN) 75 MG EC tablet Take 1 tablet (75 mg total) by mouth 2 (two) times daily for 10 days. 06/26/17 07/06/17  Godfrey Tritschler, Junius CreamerHallie C, PA-C    Family History No family history on file.  Social History Social History   Tobacco Use  . Smoking status: Current Every Day Smoker    Types: Cigarettes  Substance Use Topics  . Alcohol use: Yes  . Drug use: No     Allergies   Patient has no known allergies.   Review of Systems Review of Systems  Constitutional: Negative for chills, fatigue and fever.  Eyes: Negative for visual disturbance.  Respiratory: Negative for shortness of breath.   Cardiovascular: Negative for chest pain.  Gastrointestinal: Negative for abdominal  pain, nausea and vomiting.  Musculoskeletal: Positive for arthralgias and myalgias. Negative for back pain, gait problem and neck pain.  Skin: Negative for color change and rash.  Neurological: Negative for dizziness, syncope, weakness, light-headedness and headaches.  All other systems reviewed and are negative.    Physical Exam Triage Vital Signs ED Triage Vitals  Enc Vitals Group     BP 06/26/17 1854 (!) 173/89     Pulse Rate 06/26/17 1854 83     Resp 06/26/17 1854 14     Temp 06/26/17 1854 97.9 F (36.6 C)     Temp src --      SpO2 06/26/17 1854 97 %     Weight --      Height --      Head Circumference --      Peak Flow --      Pain Score 06/26/17 1856 8     Pain Loc --      Pain Edu? --      Excl. in GC? --    No data found.  Updated Vital Signs BP (!) 173/89   Pulse 83   Temp 97.9 F (36.6 C)   Resp 14   SpO2 97%   Visual Acuity Right Eye Distance:   Left Eye Distance:   Bilateral Distance:    Right Eye Near:   Left  Eye Near:    Bilateral Near:     Physical Exam  Constitutional: She is oriented to person, place, and time. She appears well-developed and well-nourished. No distress.  HENT:  Head: Normocephalic and atraumatic.  Eyes: Conjunctivae are normal.  Neck: Neck supple.  Cardiovascular: Normal rate and regular rhythm.  No murmur heard. Pulmonary/Chest: Effort normal and breath sounds normal. No respiratory distress.  Abdominal: Soft. She exhibits no distension. There is no tenderness.  Musculoskeletal: She exhibits no edema.  Right shoulder: no obvious deformity, no tenderness to palpation along clavicle, acromion, scapular spine. Mild tenderness to palpation of right chest. Full active ROM of shoulder, although with pain past 90 degrees. Strength 5/5 compared to left.  Right leg- mild tenderness to palpation of lateral aspect of leg. Full ROM, strength 5/5 at the hips compared to the left. Knee strength 5/5 compared to left.   Neurological: She  is alert and oriented to person, place, and time. She displays normal reflexes. Coordination normal.  Skin: Skin is warm and dry.  Psychiatric: She has a normal mood and affect.  Nursing note and vitals reviewed.    UC Treatments / Results  Labs (all labs ordered are listed, but only abnormal results are displayed) Labs Reviewed - No data to display  EKG  EKG Interpretation None       Radiology No results found.  Procedures Procedures (including critical care time)  Medications Ordered in UC Medications - No data to display   Initial Impression / Assessment and Plan / UC Course  I have reviewed the triage vital signs and the nursing notes.  Pertinent labs & imaging results that were available during my care of the patient were reviewed by me and considered in my medical decision making (see chart for details).     Patient with full strength and ROM, moderate associated pain. Will defer imaging given exam findings and lack of injury. Advised likely due to overuse/inflammation. Diclofenac twice daily. May benefit from physical therapy. Referral to Occupational health for WC.   No history of HTN, advised to monitor blood pressure at home/pharmacies and follow up if still elevated.  Final Clinical Impressions(s) / UC Diagnoses   Final diagnoses:  Right leg pain  Acute pain of right shoulder    ED Discharge Orders        Ordered    diclofenac (VOLTAREN) 75 MG EC tablet  2 times daily     06/26/17 1922       Controlled Substance Prescriptions [place] Controlled Substance Registry consulted? Not Applicable   Lew Dawes, PA-C 06/26/17 2052    Lew Dawes, [place] 06/26/17 2055

## 2017-06-26 NOTE — ED Triage Notes (Signed)
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

## 2017-06-26 NOTE — Discharge Instructions (Signed)
Please take diclofenac twice daily for right sided pain.   Rest as needed, avoid motions aggravating pain. May try shoulder strengthening exercises as pain is not as bad.  If symptoms persist please follow up with occupational health as they deal with worker's comp injuries.   Return if symptoms worsening or persisting over the next 1-2 weeks without improvement.

## 2018-10-24 ENCOUNTER — Other Ambulatory Visit: Payer: Self-pay | Admitting: Surgery

## 2018-12-03 ENCOUNTER — Other Ambulatory Visit: Payer: Self-pay | Admitting: Family Medicine

## 2018-12-03 ENCOUNTER — Other Ambulatory Visit (HOSPITAL_COMMUNITY)
Admission: RE | Admit: 2018-12-03 | Discharge: 2018-12-03 | Disposition: A | Discharge status: Home / Self Care | Payer: BC Managed Care – PPO | Source: Ambulatory Visit | Attending: Family Medicine | Admitting: Family Medicine

## 2018-12-03 DIAGNOSIS — Z124 Encounter for screening for malignant neoplasm of cervix: Secondary | ICD-10-CM | POA: Diagnosis present

## 2018-12-05 LAB — CYTOLOGY - PAP: Diagnosis: NEGATIVE

## 2018-12-11 NOTE — Progress Notes (Signed)
[facility] [address] [place] [address] Phone: [phone] Fax: [phone]  [id number] [place] Otto- [place], [place] - [id number] [facility] [address] [place] [id number] Phone: [phone] Fax: [phone]      Your procedure is scheduled on July 8th.  [facility] at 8:30 A.M., and check in at the Admitting office.  Call this number if you have problems the morning of surgery:  [phone]  Call [phone] if you have any questions prior to your surgery date Monday-Friday 8am-4pm    Remember:  Do not eat or drink after midnight the night before your surgery    Take these medicines the morning of surgery with A SIP OF WATER - NONE  7 days prior to surgery STOP taking any Aspirin (unless otherwise instructed by your surgeon), Aleve, Naproxen, Ibuprofen, Motrin, Advil, Goody's, BC's, all herbal medications, fish oil, and all vitamins.    The Morning of Surgery  Do not wear jewelry, make-up or nail polish.  Do not wear lotions, powders, or perfumes, or deodorant  Do not shave 48 hours prior to surgery.    Do not bring valuables to the hospital.  [facility] is not responsible for any belongings or valuables.  If you are a smoker, DO NOT Smoke 24 hours prior to surgery IF you wear a CPAP at night please bring your mask, tubing, and machine the morning of surgery   Remember that you must have someone to transport you home after your surgery, and remain with you for 24 hours if you are discharged the same day.   Contacts, glasses, hearing aids, dentures or bridgework may not be worn into surgery.    Leave your suitcase in the car.  After surgery it may be brought to your room.  For patients admitted to the hospital, discharge time will be determined by your treatment team.  Patients discharged the day of surgery will not be allowed  to drive home.    Special instructions:   [facility]- Preparing For Surgery  Before surgery, you can play an important role. Because skin is not sterile, your skin needs to be as free of germs as possible. You can reduce the number of germs on your skin by washing with CHG (chlorahexidine gluconate) Soap before surgery.  CHG is an antiseptic cleaner which kills germs and bonds with the skin to continue killing germs even after washing.    Oral Hygiene is also important to reduce your risk of infection.  Remember - BRUSH YOUR TEETH THE MORNING OF SURGERY WITH YOUR REGULAR TOOTHPASTE  Please do not use if you have an allergy to CHG or antibacterial soaps. If your skin becomes reddened/irritated stop using the CHG.  Do not shave (including legs and underarms) for at least 48 hours prior to first CHG shower. It is OK to shave your face.  Please follow these instructions carefully.   1. Shower the NIGHT BEFORE SURGERY and the MORNING OF SURGERY with CHG Soap.   2. If you chose to wash your hair, wash your hair first as usual with your normal shampoo.  3. After you shampoo, rinse your hair and body thoroughly to remove the shampoo.  4. Use CHG as you would any other liquid soap. You can apply CHG directly to the skin and wash gently with a scrungie or a clean washcloth.   5.  Apply the CHG Soap to your body ONLY FROM THE NECK DOWN.  Do not use on open wounds or open sores. Avoid contact with your eyes, ears, mouth and genitals (private parts). Wash Face and genitals (private parts)  with your normal soap.   6. Wash thoroughly, paying special attention to the area where your surgery will be performed.  7. Thoroughly rinse your body with warm water from the neck down.  8. DO NOT shower/wash with your normal soap after using and rinsing off the CHG Soap.  9. Pat yourself dry with a CLEAN TOWEL.  10. Wear CLEAN PAJAMAS to bed the night before surgery, wear comfortable clothes the morning of  surgery  11. Place CLEAN SHEETS on your bed the night of your first shower and DO NOT SLEEP WITH PETS.   Day of Surgery:  Do not apply any deodorants/lotions. Please shower the morning of surgery with the CHG soap  Please wear clean clothes to the hospital/surgery center.   Remember to brush your teeth WITH YOUR REGULAR TOOTHPASTE.   Please read over the following fact sheets that you were given.

## 2018-12-15 ENCOUNTER — Inpatient Hospital Stay (HOSPITAL_COMMUNITY)
Admission: RE | Admit: 2018-12-15 | Discharge: 2018-12-15 | Disposition: A | Discharge status: Home / Self Care | Payer: BC Managed Care – PPO | Source: Ambulatory Visit

## 2018-12-17 ENCOUNTER — Encounter (HOSPITAL_COMMUNITY): Admission: RE | Payer: Self-pay | Source: Home / Self Care

## 2018-12-17 ENCOUNTER — Ambulatory Visit (HOSPITAL_COMMUNITY): Admission: RE | Admit: 2018-12-17 | Payer: BC Managed Care – PPO | Source: Home / Self Care | Admitting: Surgery

## 2018-12-17 SURGERY — EXCISION, MASS, NECK
Anesthesia: Choice | Laterality: Left

## 2019-01-13 ENCOUNTER — Ambulatory Visit: Payer: BC Managed Care – PPO | Admitting: Podiatry

## 2019-01-13 ENCOUNTER — Other Ambulatory Visit: Payer: Self-pay

## 2019-01-13 ENCOUNTER — Ambulatory Visit (INDEPENDENT_AMBULATORY_CARE_PROVIDER_SITE_OTHER): Payer: BC Managed Care – PPO

## 2019-01-13 ENCOUNTER — Encounter: Payer: Self-pay | Admitting: Podiatry

## 2019-01-13 ENCOUNTER — Other Ambulatory Visit: Payer: Self-pay | Admitting: Podiatry

## 2019-01-13 VITALS — BP 159/94 | HR 89 | Temp 98.1°F | Resp 16

## 2019-01-13 DIAGNOSIS — M79672 Pain in left foot: Secondary | ICD-10-CM | POA: Diagnosis not present

## 2019-01-13 DIAGNOSIS — M779 Enthesopathy, unspecified: Secondary | ICD-10-CM

## 2019-01-13 DIAGNOSIS — M722 Plantar fascial fibromatosis: Secondary | ICD-10-CM

## 2019-01-13 MED ORDER — DICLOFENAC SODIUM 1 % TD GEL: 2.0000 g | Freq: Four times a day (QID) | TRANSDERMAL | 2 refills | Status: DC

## 2019-01-13 NOTE — Progress Notes (Signed)
   Subjective:    Patient ID: Kristen Bautista, female    DOB: 1955-07-28, 63 y.o.   MRN: [id number]  HPI 63 year old female presents the office with concerns of pain in the left midfoot forefoot as well as swelling.  She states this is been ongoing for last 4 years and happens this time a year when she is on her feet at work more.  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  She is not at work the pain and swelling resolved.  Denies any recent injury or trauma.   Review of Systems  All other systems reviewed and are negative.  History reviewed. No pertinent past medical history.  History reviewed. No pertinent surgical history.   Current Outpatient Medications:  .  diclofenac sodium (VOLTAREN) 1 % GEL, Apply 2 g topically 4 (four) times daily. Rub into affected area of foot 2 to 4 times daily, Disp: 100 g, Rfl: 2  No Known Allergies       Objective:   Physical Exam  General: AAO x3, NAD  Dermatological: Skin is warm, dry and supple bilateral. Nails x 10 are well manicured; remaining integument appears unremarkable at this time. There are no open sores, no preulcerative lesions, no rash or signs of infection present.  Vascular: Dorsalis Pedis artery and Posterior Tibial artery pedal pulses are 2/4 bilateral with immedate capillary fill time. Pedal hair growth present. No varicosities and no lower extremity edema present bilateral. There is no pain with calf compression, swelling, warmth, erythema.   Neruologic: Grossly intact via light touch bilateral. Protective threshold with Semmes Wienstein monofilament intact to all pedal sites bilateral.   Musculoskeletal: Mild edema as well as tenderness dorsum left midfoot.  There is no specific area pinpoint tenderness there is no erythema or warmth.  Ankle, subtalar joint range of motion intact.  Muscular strength 5/5 in all groups tested bilateral.   Gait: Unassisted, Nonantalgic.      Assessment & Plan:   Left midfoot pain, capsulitis/tendinitis  -Treatment options discussed including all alternatives, risks, and complications -Etiology of symptoms were discussed -X-rays were obtained and reviewed with the patient. Not able to appreciate evidence of acute fracture or stress fracture today. -She was to hold off on oral medications.  Prescribed Voltaren gel.  Dispensed compression anklet.  Discussed wearing stiffer soled shoe and she has better support.  Offered steroid injection today as well.  Trula Slade DPM

## 2019-05-14 ENCOUNTER — Other Ambulatory Visit: Payer: Self-pay

## 2019-05-14 ENCOUNTER — Ambulatory Visit: Payer: BC Managed Care – PPO | Admitting: Podiatry

## 2019-05-14 DIAGNOSIS — M779 Enthesopathy, unspecified: Secondary | ICD-10-CM | POA: Diagnosis not present

## 2019-05-14 DIAGNOSIS — M79671 Pain in right foot: Secondary | ICD-10-CM

## 2019-05-14 MED ORDER — DICLOFENAC SODIUM 1 % EX GEL: 2.0000 g | Freq: Four times a day (QID) | CUTANEOUS | 2 refills | Status: DC

## 2019-05-15 ENCOUNTER — Telehealth: Payer: Self-pay | Admitting: *Deleted

## 2019-05-15 DIAGNOSIS — M779 Enthesopathy, unspecified: Secondary | ICD-10-CM

## 2019-05-15 DIAGNOSIS — T148XXA Other injury of unspecified body region, initial encounter: Secondary | ICD-10-CM

## 2019-05-15 NOTE — Telephone Encounter (Signed)
Left message informing pt I would send Dr. Margarette Asal a message concerning the MRI orders, and once ready she would be contacted to schedule, and for the swelling in the right hand she should contact her primary doctor to see if they would refer to an orthopedist.

## 2019-05-15 NOTE — Telephone Encounter (Signed)
Pt states she was seen yesterday and the foot is okay but her right hand is swollen, and she wants to know about her MRI.

## 2019-05-15 NOTE — Progress Notes (Signed)
Subjective: 63 year old female presents the office today for concerns and follow-up evaluation bilateral foot pain.  She said the left foot is been doing well and feels much better with the compression ankle as well as the inserts, she is getting pain to the right foot.  She states that she like to see what is going on and take another look.  She is gets intermittent pain she points to the lateral aspect of her foot where she gets flareups.  She points to the fifth metatarsal base where she gets majority of tenderness.  She denies any recent injury or falls. Denies any systemic complaints such as fevers, chills, nausea, vomiting. No acute changes since last appointment, and no other complaints at this time.   Objective: AAO x3, NAD DP/PT pulses palpable bilaterally, CRT less than 3 seconds On the left foot there is no pain.  The right foot there is mild discomfort on the fifth metatarsal base along the course the peroneal tendon distally.  Overall the tendon appears to be intact and there is mild swelling.  There is no erythema or warmth.  No area of pinpoint tenderness.  No pain the Achilles tendon or other flexor, extensor tendons. No open lesions or pre-ulcerative lesions.  No pain with calf compression, swelling, warmth, erythema  Assessment: Right foot pain, rule out partial tear of the peroneal tendon  Plan: -All treatment options discussed with the patient including all alternatives, risks, complications.  -Dispensed a Tri-Lock ankle brace.  Also Voltaren gel.  I added a lateral wedge to the orthotic on the right side.  Also she would need to get an MRI of the right side.  This will be ordered to rule out a partial tear. -Patient encouraged to call the office with any questions, concerns, change in symptoms.   Trula Slade DPM

## 2019-05-15 NOTE — Telephone Encounter (Signed)
Can you please order an MRI of the right ankle to rule out partial tear of the peroenal tendon. Most of her pain is along the distal part toward the 5th metatarsal base so if they can include as distal as possible.

## 2019-05-18 NOTE — Telephone Encounter (Signed)
Orders to Dr. Leigh Aurora assistant for pre-cert and faxed to [facility].

## 2019-05-20 ENCOUNTER — Ambulatory Visit
Admission: RE | Admit: 2019-05-20 | Discharge: 2019-05-20 | Disposition: A | Discharge status: Home / Self Care | Payer: BC Managed Care – PPO | Source: Ambulatory Visit | Attending: Podiatry | Admitting: Podiatry

## 2019-05-20 ENCOUNTER — Other Ambulatory Visit: Payer: Self-pay

## 2019-05-20 DIAGNOSIS — M779 Enthesopathy, unspecified: Secondary | ICD-10-CM

## 2019-05-20 DIAGNOSIS — T148XXA Other injury of unspecified body region, initial encounter: Secondary | ICD-10-CM

## 2019-05-20 IMAGING — MR MR ANKLE*R* W/O CM
2 series · 28 of 32 positions shown · non-contrast
Comparison: Radiograph [DATE]

CLINICAL DATA: Ankle pain near the fifth metatarsal base

EXAM:
MRI OF THE RIGHT ANKLE WITHOUT CONTRAST
TECHNIQUE: Multiplanar, multisequence MR imaging of the ankle was performed. No
intravenous contrast was administered.

[Series 6: T1 · sagittal · 4.0mm · 0.56mm/px · 16 of 16 slices shown]
[im 1/16]
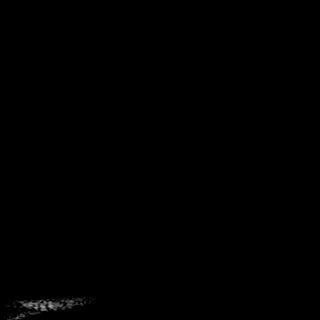
[im 2/16]
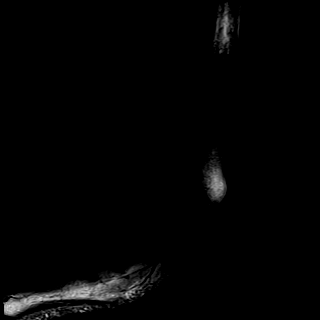
[im 3/16]
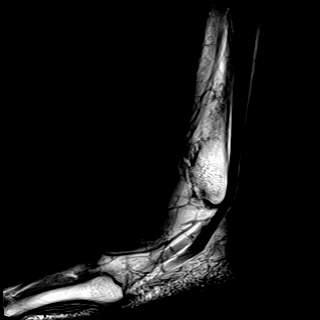
[im 4/16]
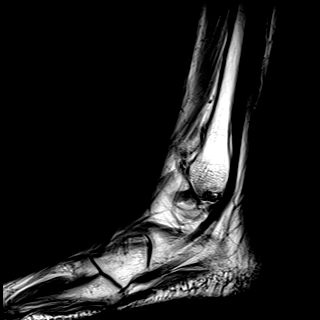
[im 5/16]
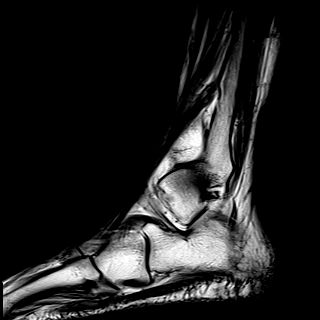
[im 6/16]
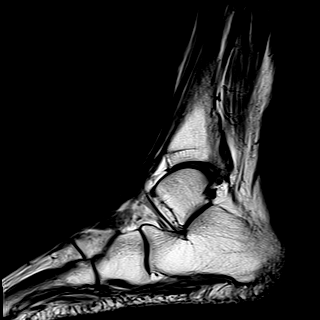
[im 7/16]
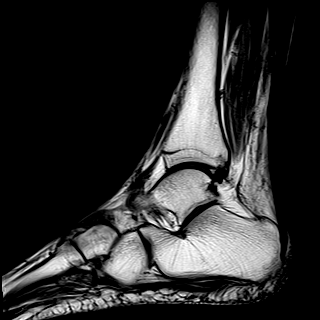
[im 8/16]
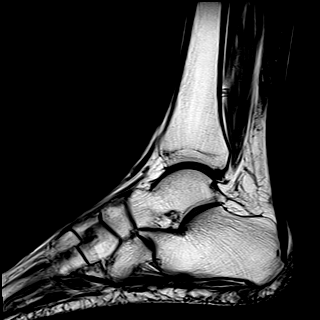
[im 9/16]
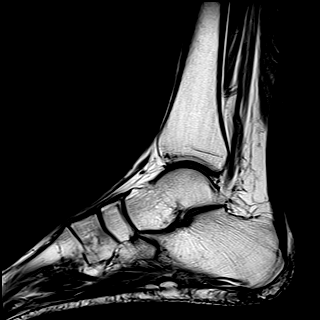
[im 10/16]
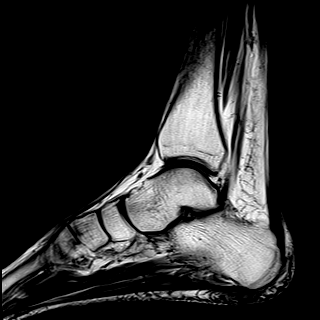
[im 11/16]
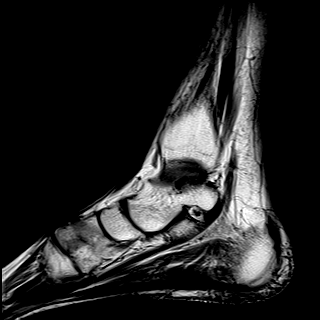
[im 12/16]
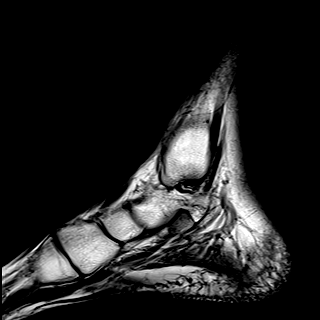
[im 13/16]
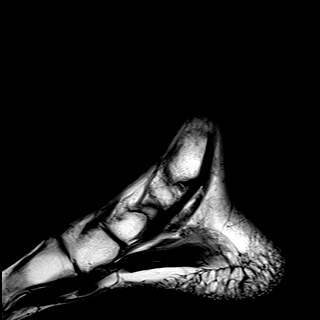
[im 14/16]
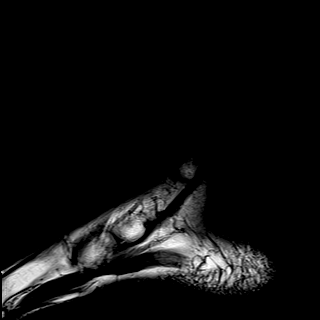
[im 15/16]
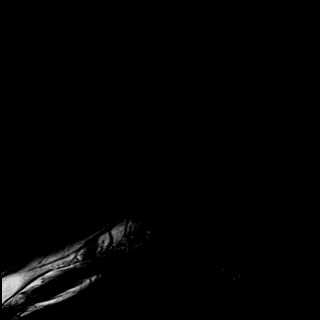
[im 16/16]
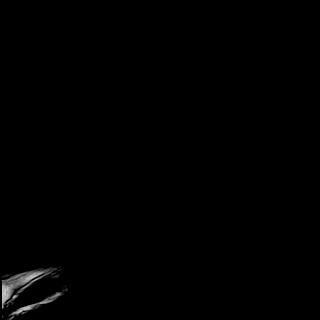

[Series 7: STIR · sagittal · 4.0mm · 0.35mm/px · 12 of 16 slices shown]
[im 1/16]
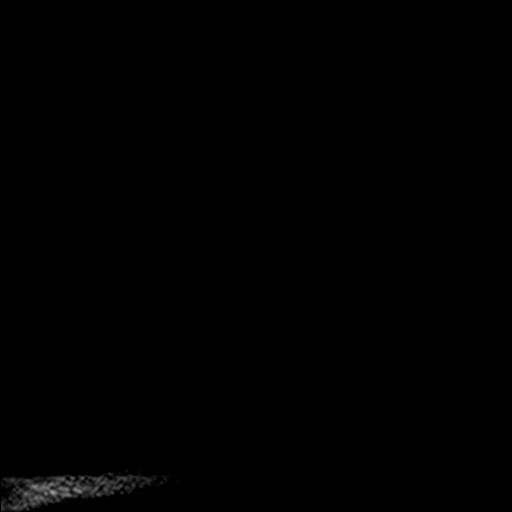
[im 2/16]
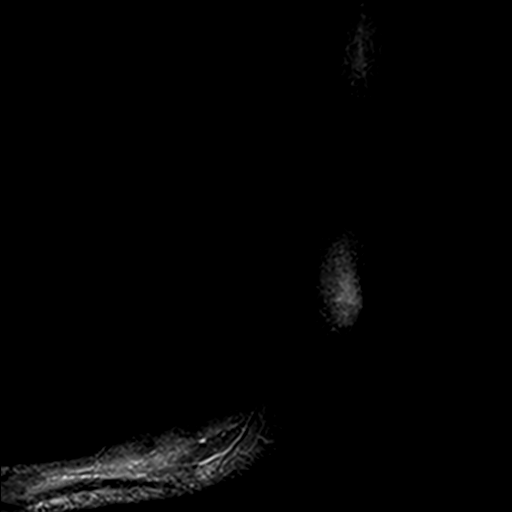
[im 3/16]
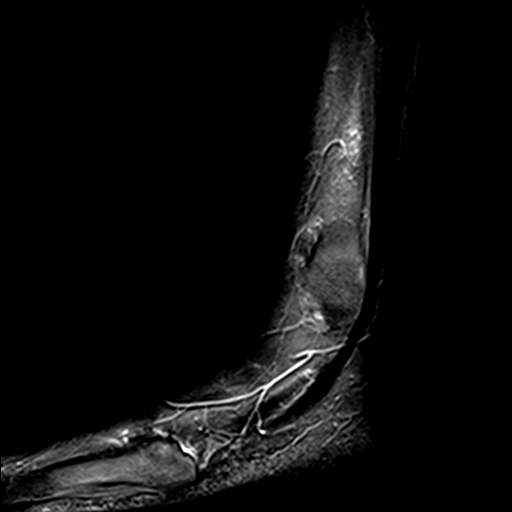
[im 4/16]
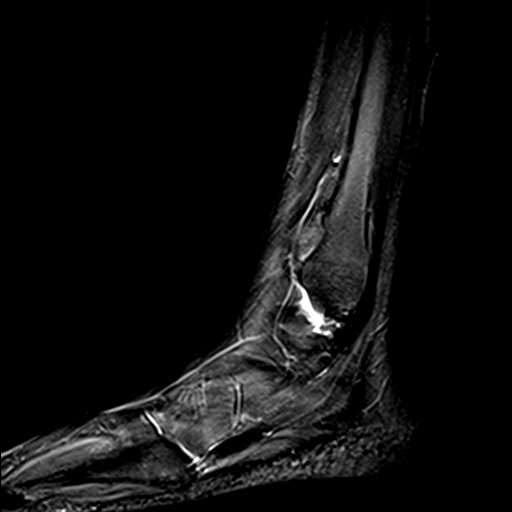
[im 5/16]
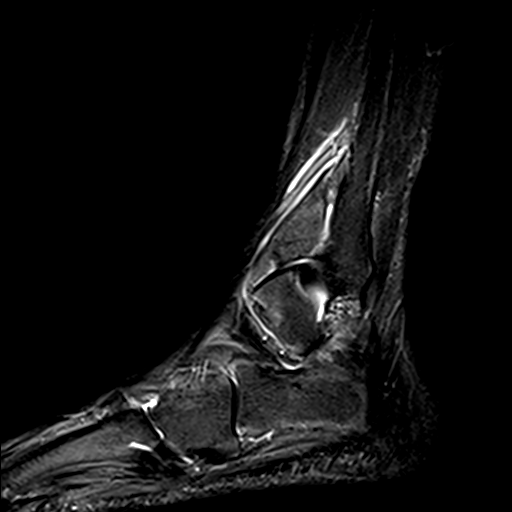
[im 6/16]
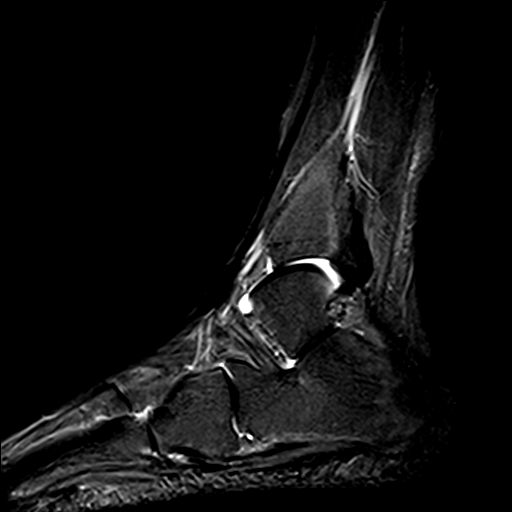
[im 8/16]
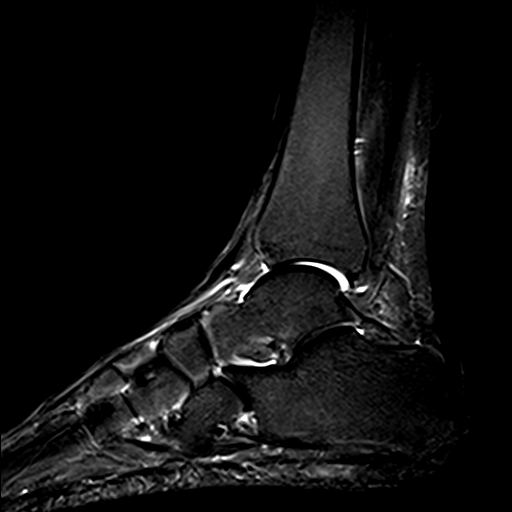
[im 9/16]
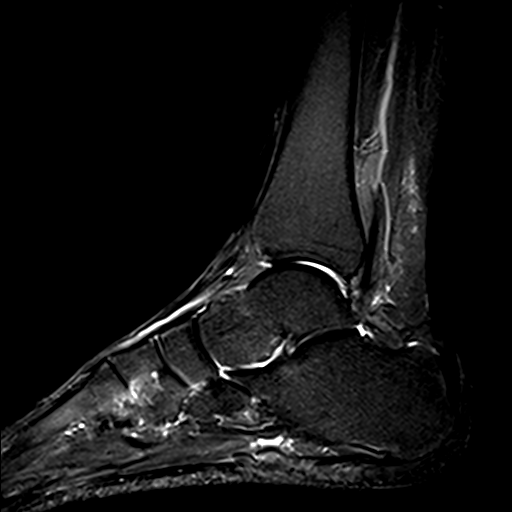
[im 10/16]
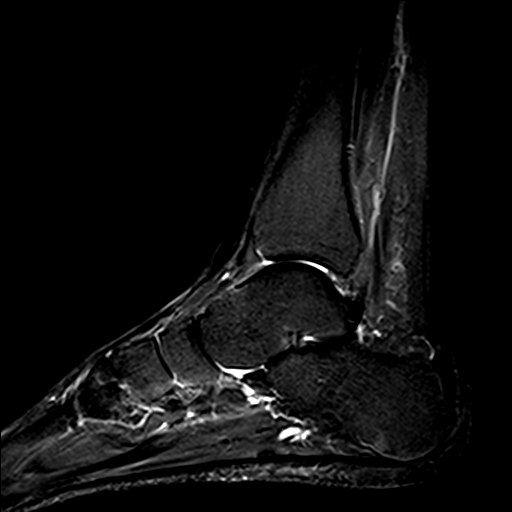
[im 12/16]
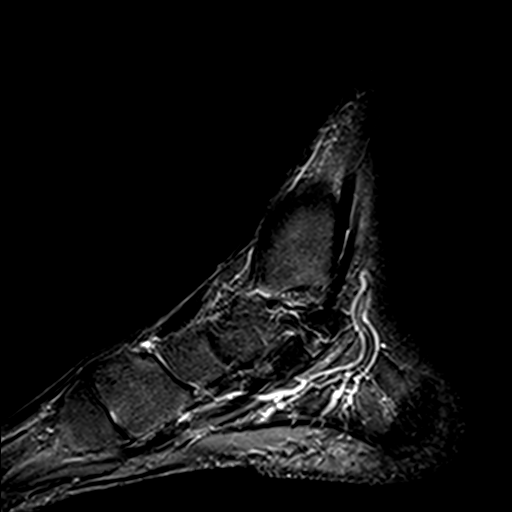
[im 14/16]
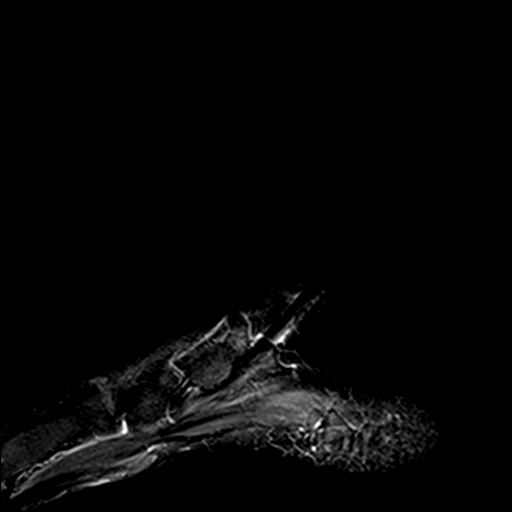
[im 16/16]
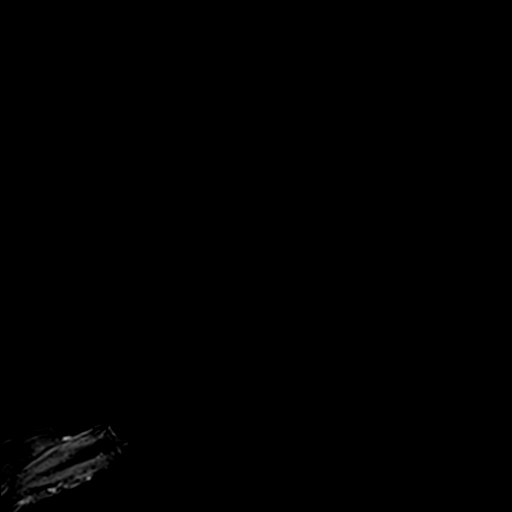

[28 of 32 positions shown; findings below may reference images not displayed]

FINDINGS: TENDONS

Peroneal: Peroneal longus tendon intact. Peroneal brevis intact.

Posteromedial: Posterior tibial tendon intact. Flexor hallucis
longus tendon intact. Flexor digitorum longus tendon intact.

Anterior: Tibialis anterior tendon intact. Extensor hallucis longus
tendon intact Extensor digitorum longus tendon intact.

Achilles: Mildly increased intrasubstance signal seen at the
insertion site of the Achilles tendon extending approximately 1 cm.

Plantar Fascia: Mildly increased intrasubstance signal seen within
the middle cord of the plantar fascia with adjacent subchondral
marrow signal change at the calcaneal insertion site.

LIGAMENTS

Lateral: Anterior talofibular ligament intact. Calcaneofibular
ligament intact. Posterior talofibular ligament intact. Anterior and
posterior tibiofibular ligaments intact.

Medial: Deltoid ligament intact. Spring ligament intact.

CARTILAGE

Ankle Joint: Trace ankle joint effusion. Normal ankle mortise. No
chondral defect.

Subtalar Joints/Sinus Tarsi: Normal subtalar joints. No subtalar
joint effusion. Normal sinus tarsi.

Bones: Minimally increased signal seen at the lateral base of the
fifth metatarsal and lateral cuboid. No osseous fracture, avascular
necrosis, or pathologic marrow infiltration is seen.

Soft Tissue: No focal soft tissue swelling or soft tissue mass is
seen.
IMPRESSION: 1. Minimally increased marrow signal seen at the base of the fifth
metatarsal and lateral cuboid which could be due to mild stress
reaction.
2. Intact peroneal tendons
3. Mild insertional Achilles tendinosis
4. Mild middle cord plantar fasciitis.

## 2019-06-03 ENCOUNTER — Telehealth: Payer: Self-pay | Admitting: *Deleted

## 2019-06-03 NOTE — Telephone Encounter (Signed)
Called patient and the patient got the voltaren gel off the aisle at the pharmacy. Kristen Bautista

## 2019-06-16 ENCOUNTER — Other Ambulatory Visit: Payer: Self-pay

## 2019-06-16 ENCOUNTER — Ambulatory Visit: Payer: BC Managed Care – PPO | Admitting: Podiatry

## 2019-06-16 ENCOUNTER — Encounter: Payer: Self-pay | Admitting: Podiatry

## 2019-06-16 VITALS — Temp 97.0°F

## 2019-06-16 DIAGNOSIS — M79671 Pain in right foot: Secondary | ICD-10-CM

## 2019-06-16 DIAGNOSIS — M779 Enthesopathy, unspecified: Secondary | ICD-10-CM

## 2019-06-16 DIAGNOSIS — M79672 Pain in left foot: Secondary | ICD-10-CM

## 2019-06-16 NOTE — Progress Notes (Signed)
Subjective: 64 year old female presents the office today for follow evaluation of right foot pain.  She states that she has been doing well she is having no pain to her foot.  She does wear the ankle brace while working.  She has not been using Voltaren gel because she has not been having any pain.  She denies any recent injury.  No swelling.  No other concerns. Denies any systemic complaints such as fevers, chills, nausea, vomiting. No acute changes since last appointment, and no other complaints at this time.   Objective: AAO x3, NAD DP/PT pulses palpable bilaterally, CRT less than 3 seconds At this time there is no tenderness palpation of the bilateral feet.  Specifically there is no pain on the right foot on the fifth metatarsal base on the course the peroneal tendon.  There is no pain along the lateral aspect of the foot along the cuboid or calcaneus.  There is no edema, erythema. No pain with calf compression, swelling, warmth, erythema  Assessment: 64 year old female with resolved bilateral foot pain, stress reaction right foot   Plan: -All treatment options discussed with the patient including all alternatives, risks, complications.  -For now continue with ankle brace on the right foot. I would like for her to start stretching exercises to help rehab bilateral lower extremities.  Patient states to feel better discussed weaning off and wearing ankle brace.  Voltaren gel and ice as needed but for now she is doing well without any pain. -She describes a pain to her left hip.  She also states that she gets stiffness to her legs when she wakes up in the morning.  Both of the stretching stretches will help.  Also discussed follow-up with her primary care physician/orthopedics for left hip if symptoms continue.  She does not drink much water and she typically drinks coffee and beer and we discussed staying hydrated. -Patient encouraged to call the office with any questions, concerns, change in  symptoms.   Vivi Barrack DPM

## 2019-06-16 NOTE — Patient Instructions (Addendum)
Start the below exercises to help strengthen the feet muscles. You do not have plantar fasciitis but I like the exercises to help.     Plantar Fasciitis (Heel Spur Syndrome) with Rehab The plantar fascia is a fibrous, ligament-like, soft-tissue structure that spans the bottom of the foot. Plantar fasciitis is a condition that causes pain in the foot due to inflammation of the tissue. SYMPTOMS   Pain and tenderness on the underneath side of the foot.  Pain that worsens with standing or walking. CAUSES  Plantar fasciitis is caused by irritation and injury to the plantar fascia on the underneath side of the foot. Common mechanisms of injury include:  Direct trauma to bottom of the foot.  Damage to a small nerve that runs under the foot where the main fascia attaches to the heel bone.  Stress placed on the plantar fascia due to bone spurs. RISK INCREASES WITH:   Activities that place stress on the plantar fascia (running, jumping, pivoting, or cutting).  Poor strength and flexibility.  Improperly fitted shoes.  Tight calf muscles.  Flat feet.  Failure to warm-up properly before activity.  Obesity. PREVENTION  Warm up and stretch properly before activity.  Allow for adequate recovery between workouts.  Maintain physical fitness:  Strength, flexibility, and endurance.  Cardiovascular fitness.  Maintain a health body weight.  Avoid stress on the plantar fascia.  Wear properly fitted shoes, including arch supports for individuals who have flat feet.  PROGNOSIS  If treated properly, then the symptoms of plantar fasciitis usually resolve without surgery. However, occasionally surgery is necessary.  RELATED COMPLICATIONS   Recurrent symptoms that may result in a chronic condition.  Problems of the lower back that are caused by compensating for the injury, such as limping.  Pain or weakness of the foot during push-off following surgery.  Chronic inflammation,  scarring, and partial or complete fascia tear, occurring more often from repeated injections.  TREATMENT  Treatment initially involves the use of ice and medication to help reduce pain and inflammation. The use of strengthening and stretching exercises may help reduce pain with activity, especially stretches of the Achilles tendon. These exercises may be performed at home or with a therapist. Your caregiver may recommend that you use heel cups of arch supports to help reduce stress on the plantar fascia. Occasionally, corticosteroid injections are given to reduce inflammation. If symptoms persist for greater than 6 months despite non-surgical (conservative), then surgery may be recommended.   MEDICATION   If pain medication is necessary, then nonsteroidal anti-inflammatory medications, such as aspirin and ibuprofen, or other minor pain relievers, such as acetaminophen, are often recommended.  Do not take pain medication within 7 days before surgery.  Prescription pain relievers may be given if deemed necessary by your caregiver. Use only as directed and only as much as you need.  Corticosteroid injections may be given by your caregiver. These injections should be reserved for the most serious cases, because they may only be given a certain number of times.  HEAT AND COLD  Cold treatment (icing) relieves pain and reduces inflammation. Cold treatment should be applied for 10 to 15 minutes every 2 to 3 hours for inflammation and pain and immediately after any activity that aggravates your symptoms. Use ice packs or massage the area with a piece of ice (ice massage).  Heat treatment may be used prior to performing the stretching and strengthening activities prescribed by your caregiver, physical therapist, or athletic trainer. Use a heat pack  or soak the injury in warm water.  SEEK IMMEDIATE MEDICAL CARE IF:  Treatment seems to offer no benefit, or the condition worsens.  Any medications produce  adverse side effects.  EXERCISES- RANGE OF MOTION (ROM) AND STRETCHING EXERCISES - Plantar Fasciitis (Heel Spur Syndrome) These exercises may help you when beginning to rehabilitate your injury. Your symptoms may resolve with or without further involvement from your physician, physical therapist or athletic trainer. While completing these exercises, remember:   Restoring tissue flexibility helps normal motion to return to the joints. This allows healthier, less painful movement and activity.  An effective stretch should be held for at least 30 seconds.  A stretch should never be painful. You should only feel a gentle lengthening or release in the stretched tissue.  RANGE OF MOTION - Toe Extension, Flexion  Sit with your right / left leg crossed over your opposite knee.  Grasp your toes and gently pull them back toward the top of your foot. You should feel a stretch on the bottom of your toes and/or foot.  Hold this stretch for 10 seconds.  Now, gently pull your toes toward the bottom of your foot. You should feel a stretch on the top of your toes and or foot.  Hold this stretch for 10 seconds. Repeat  times. Complete this stretch 3 times per day.   RANGE OF MOTION - Ankle Dorsiflexion, Active Assisted  Remove shoes and sit on a chair that is preferably not on a carpeted surface.  Place right / left foot under knee. Extend your opposite leg for support.  Keeping your heel down, slide your right / left foot back toward the chair until you feel a stretch at your ankle or calf. If you do not feel a stretch, slide your bottom forward to the edge of the chair, while still keeping your heel down.  Hold this stretch for 10 seconds. Repeat 3 times. Complete this stretch 2 times per day.   STRETCH  Gastroc, Standing  Place hands on wall.  Extend right / left leg, keeping the front knee somewhat bent.  Slightly point your toes inward on your back foot.  Keeping your right / left heel  on the floor and your knee straight, shift your weight toward the wall, not allowing your back to arch.  You should feel a gentle stretch in the right / left calf. Hold this position for 10 seconds. Repeat 3 times. Complete this stretch 2 times per day.  STRETCH  Soleus, Standing  Place hands on wall.  Extend right / left leg, keeping the other knee somewhat bent.  Slightly point your toes inward on your back foot.  Keep your right / left heel on the floor, bend your back knee, and slightly shift your weight over the back leg so that you feel a gentle stretch deep in your back calf.  Hold this position for 10 seconds. Repeat 3 times. Complete this stretch 2 times per day.  STRETCH  Gastrocsoleus, Standing  Note: This exercise can place a lot of stress on your foot and ankle. Please complete this exercise only if specifically instructed by your caregiver.   Place the ball of your right / left foot on a step, keeping your other foot firmly on the same step.  Hold on to the wall or a rail for balance.  Slowly lift your other foot, allowing your body weight to press your heel down over the edge of the step.  You should feel  a stretch in your right / left calf.  Hold this position for 10 seconds.  Repeat this exercise with a slight bend in your right / left knee. Repeat 3 times. Complete this stretch 2 times per day.   STRENGTHENING EXERCISES - Plantar Fasciitis (Heel Spur Syndrome)  These exercises may help you when beginning to rehabilitate your injury. They may resolve your symptoms with or without further involvement from your physician, physical therapist or athletic trainer. While completing these exercises, remember:   Muscles can gain both the endurance and the strength needed for everyday activities through controlled exercises.  Complete these exercises as instructed by your physician, physical therapist or athletic trainer. Progress the resistance and repetitions only as  guided.  STRENGTH - Towel Curls  Sit in a chair positioned on a non-carpeted surface.  Place your foot on a towel, keeping your heel on the floor.  Pull the towel toward your heel by only curling your toes. Keep your heel on the floor. Repeat 3 times. Complete this exercise 2 times per day.  STRENGTH - Ankle Inversion  Secure one end of a rubber exercise band/tubing to a fixed object (table, pole). Loop the other end around your foot just before your toes.  Place your fists between your knees. This will focus your strengthening at your ankle.  Slowly, pull your big toe up and in, making sure the band/tubing is positioned to resist the entire motion.  Hold this position for 10 seconds.  Have your muscles resist the band/tubing as it slowly pulls your foot back to the starting position. Repeat 3 times. Complete this exercises 2 times per day.  Document Released: 05/28/2005 Document Revised: 08/20/2011 Document Reviewed: 09/09/2008 [facility] Patient Information 2014 [place], [place].

## 2020-03-22 ENCOUNTER — Other Ambulatory Visit: Payer: Self-pay | Admitting: Family Medicine

## 2020-03-22 DIAGNOSIS — M79651 Pain in right thigh: Secondary | ICD-10-CM

## 2020-03-29 ENCOUNTER — Encounter: Payer: Self-pay | Admitting: Neurology

## 2020-04-10 ENCOUNTER — Ambulatory Visit
Admission: RE | Admit: 2020-04-10 | Discharge: 2020-04-10 | Disposition: A | Discharge status: Home / Self Care | Payer: BC Managed Care – PPO | Source: Ambulatory Visit | Attending: Family Medicine | Admitting: Family Medicine

## 2020-04-10 DIAGNOSIS — M79651 Pain in right thigh: Secondary | ICD-10-CM

## 2020-04-10 IMAGING — MR MR FEMUR*L* W/O CM
4 of 5 series · 14 of 40 positions shown · non-contrast
Comparison: Radiographs [DATE].

CLINICAL DATA: Anterior thigh pain for 1 month. Possible injury.
Worsening pain with extended use.

EXAM:
MR OF THE LEFT FEMUR WITHOUT CONTRAST
TECHNIQUE: Multiplanar, multisequence MR imaging of the left thigh was
performed. No intravenous contrast was administered.

[Series 5: T1 · axial · left · 5.0mm · 0.34mm/px · z∈[-67,+191]mm · 3 of 60 slices shown (1 of 2)]
[im 11/60]
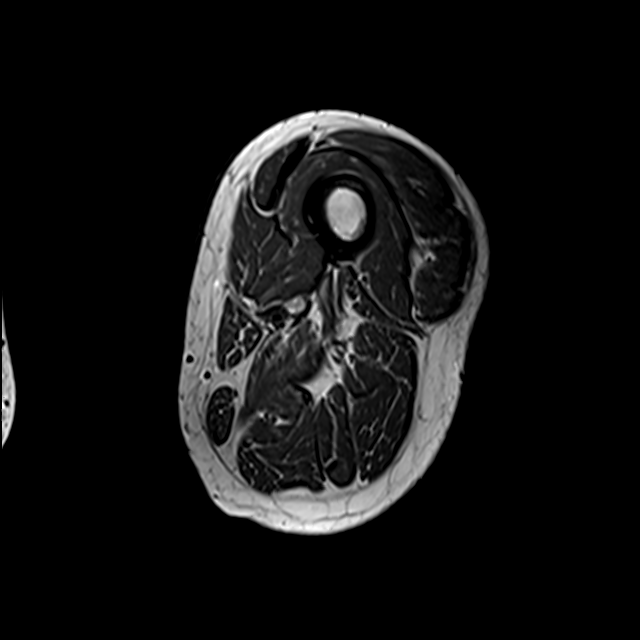
[im 33/60]
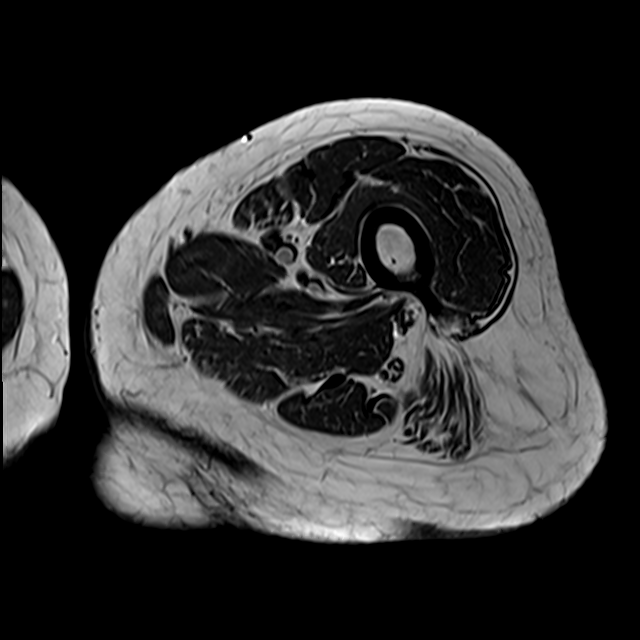
[im 54/60]
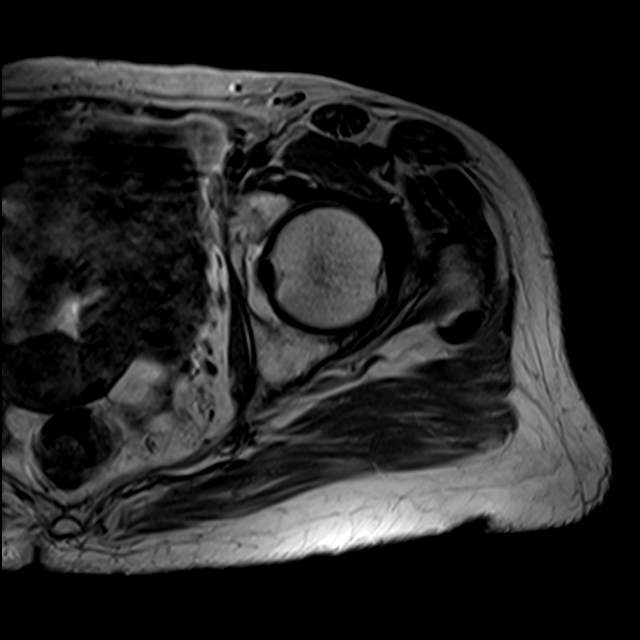

[Series 6: T2 fat-sat · axial · left · 5.0mm · 0.34mm/px · z∈[-127,+191]mm · 5 of 60 slices shown (1 of 2)]
[im 1/60]
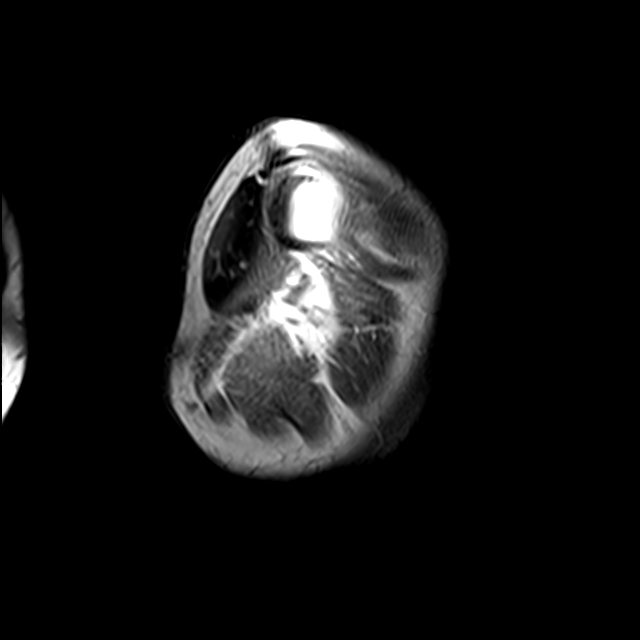
[im 11/60]
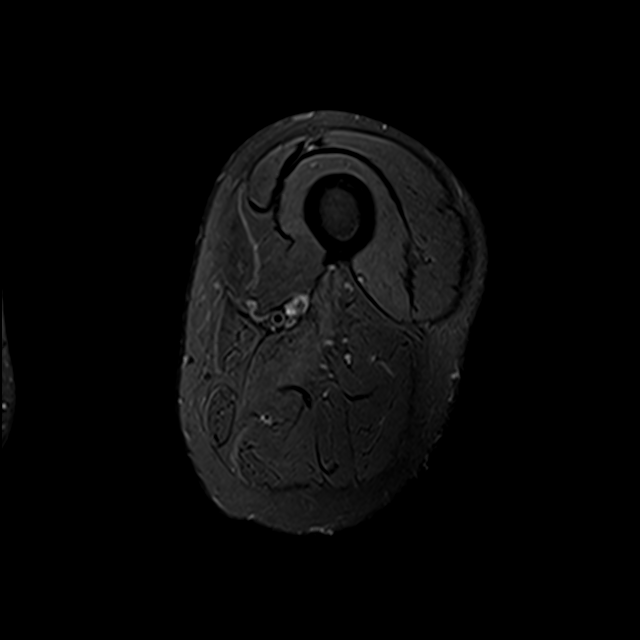
[im 17/60]
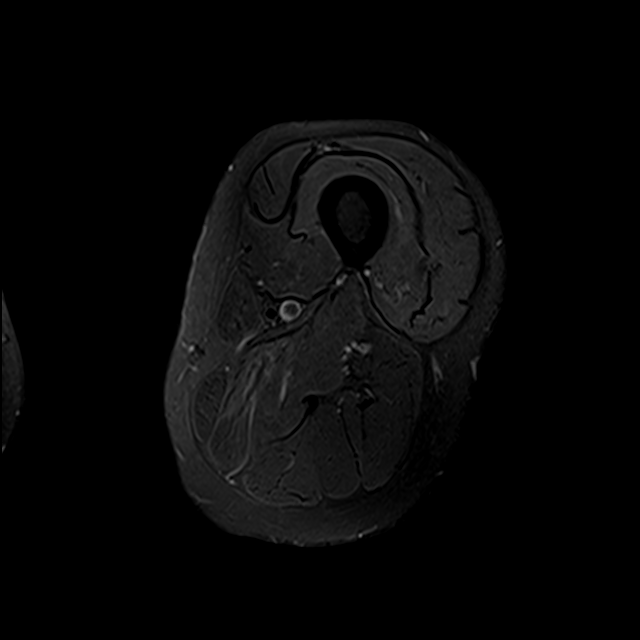
[im 33/60]
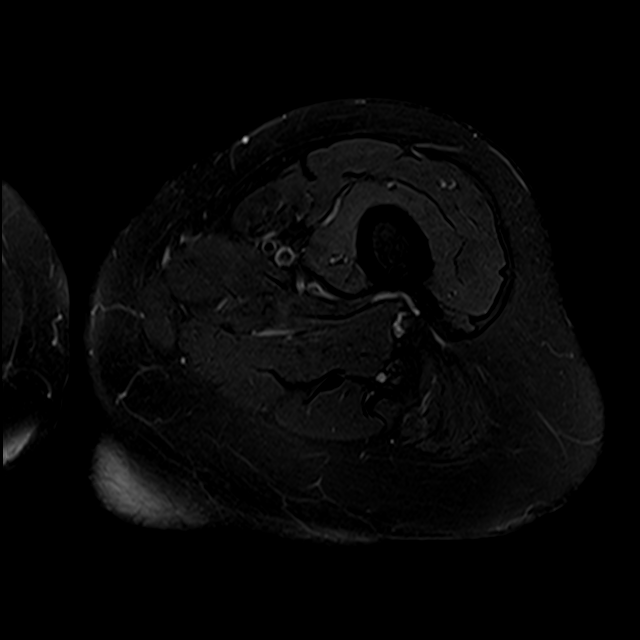
[im 54/60]
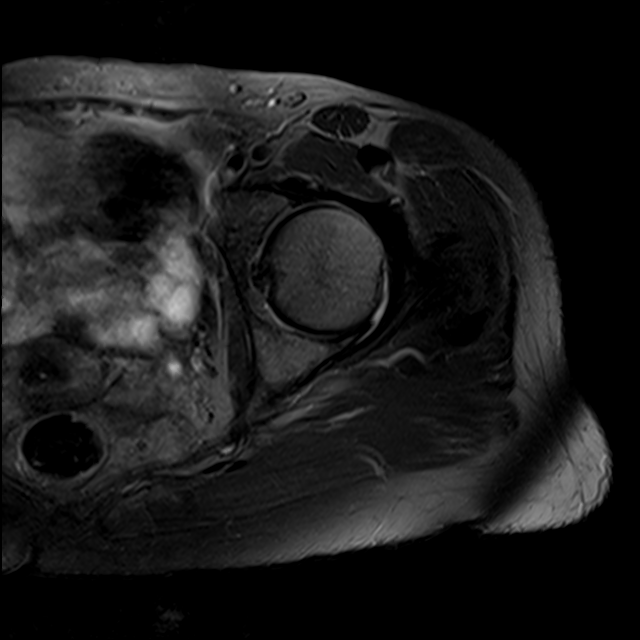

[Series 7: T1 · coronal · left · 5.0mm · 1.04mm/px · 3 of 23 slices shown (2 of 2)]
[im 1/23]
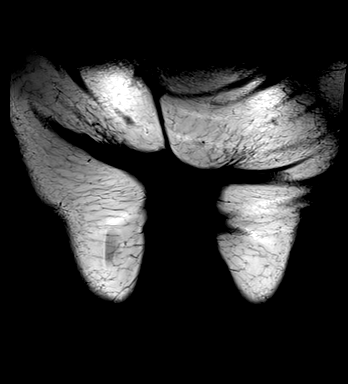
[im 12/23]
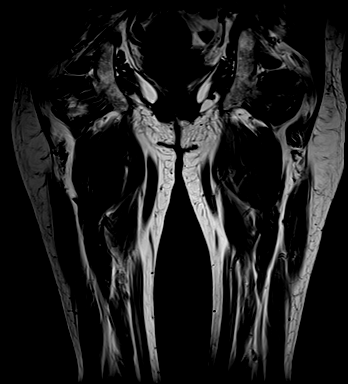
[im 23/23]
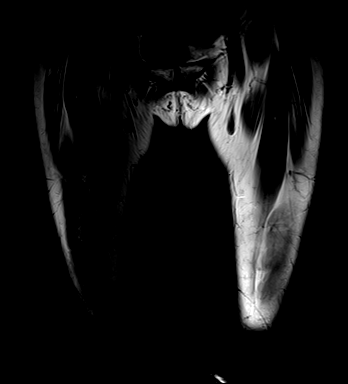

[Series 9: T2 fat-sat · sagittal · left · 4.0mm · 0.45mm/px · 3 of 27 slices shown (2 of 2)]
[im 6/27]
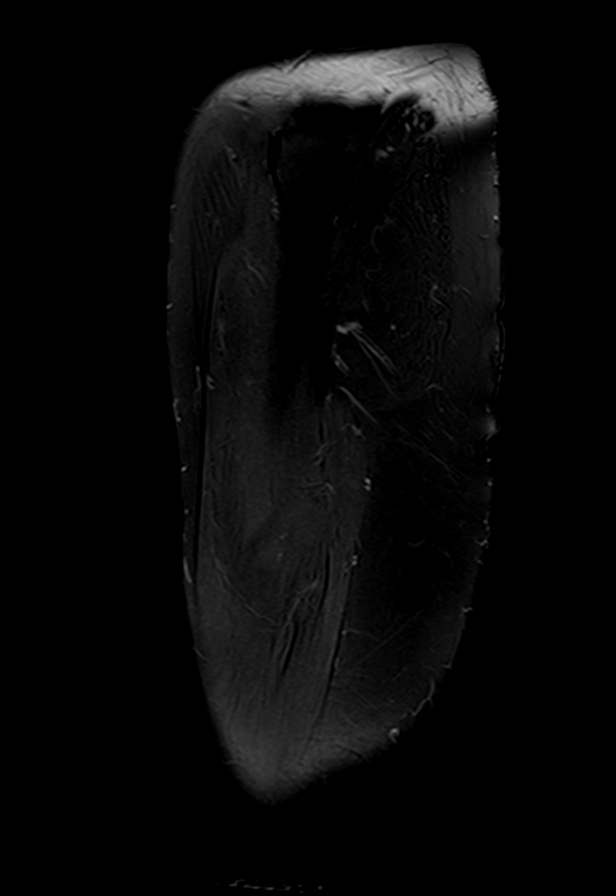
[im 16/27]
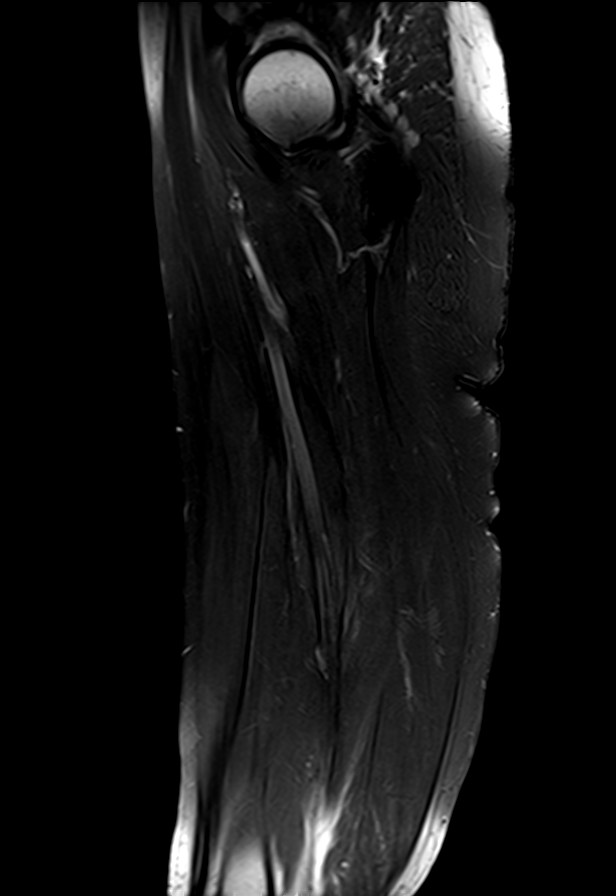
[im 27/27]
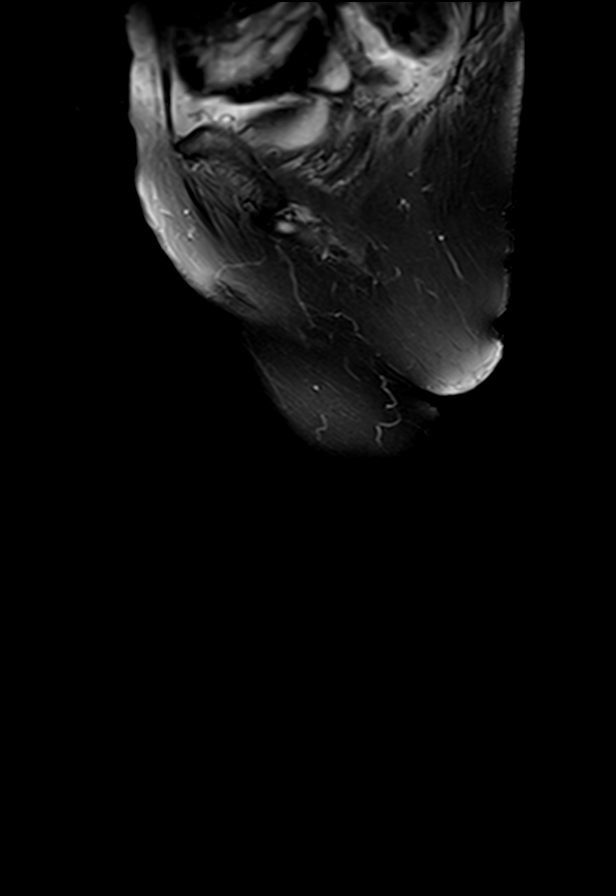

[14 of 40 positions shown; findings below may reference images not displayed]

FINDINGS: Bones/Joint/Cartilage

Both thighs are included on the coronal images. The images extend
from the lower pelvis through the distal thighs. The knees are not
imaged. Both visualized femurs appear normal. No significant
degenerative changes or effusions at either hip.

Ligaments

Unremarkable.

Muscles and Tendons

There is mild T2 hyperintensity within the adductor magnus muscle in
the mid thigh, probably reflecting posttraumatic muscular strain.
There is no focal fluid collection or atrophy. The additional left
thigh muscles and tendons appear normal. There is a small
subperiosteal lipoma along the medial aspect of the mid left femur,
measuring 1.5 x 0.7 cm transverse and extending approximately 5 cm
in length.

Soft tissues

No focal fluid collection or inflammatory change. As above, small
subperiosteal lipoma along the medial aspect of the mid left femur.
IMPRESSION: 1. Mild T2 hyperintensity within the adductor magnus muscle in the
mid left thigh, probably reflecting posttraumatic muscular strain.
2. Small subperiosteal lipoma along the medial aspect of the mid
left femur.
3. No acute findings or significant degenerative changes at either
hip.

## 2020-06-30 ENCOUNTER — Other Ambulatory Visit: Payer: Self-pay

## 2020-06-30 ENCOUNTER — Ambulatory Visit: Payer: Self-pay | Admitting: Neurology

## 2020-06-30 ENCOUNTER — Other Ambulatory Visit (INDEPENDENT_AMBULATORY_CARE_PROVIDER_SITE_OTHER): Payer: BC Managed Care – PPO

## 2020-06-30 ENCOUNTER — Ambulatory Visit: Payer: BC Managed Care – PPO | Admitting: Neurology

## 2020-06-30 ENCOUNTER — Encounter: Payer: Self-pay | Admitting: Neurology

## 2020-06-30 VITALS — BP 148/87 | HR 83 | Ht 67.0 in | Wt 119.0 lb

## 2020-06-30 DIAGNOSIS — G621 Alcoholic polyneuropathy: Secondary | ICD-10-CM

## 2020-06-30 DIAGNOSIS — F1020 Alcohol dependence, uncomplicated: Secondary | ICD-10-CM

## 2020-06-30 LAB — FOLATE: Folate: 15.2 ng/mL (ref >5.9)

## 2020-06-30 NOTE — Progress Notes (Signed)
[facility] Clinic Note - Initial Visit   Date: 06/30/20  Kristen Bautista MRN: [id number] DOB: Jun 08, 1956   Dear Dr. Valentina Lucks:  Thank you for your kind referral of Kristen Bautista for consultation of paresthesias. Although her history is well known to you, please allow [place] to reiterate it for the purpose of our medical record. The patient was accompanied to the clinic by self.   History of Present Illness: Kristen Bautista is a 65 y.o. left-handed female with history of hepatitis C, tobacco use, and alcohol dependency presenting for evaluation of numbness/tingling. Over the past several months, she began having tingling over the fingertip and more recently, she reports numbness over the soles of the feet. Symptoms are constant and more bothersome in the hands.  She also complains of stiffness in the hands and achy right heel/ankle pain.  No falls or imbalance.  She denies weakness.  She admits to drinking 2-3 beers nightly for 40+ years and more on the weekends.  Her TSH and vitamin B12 level was check by her PCP and normal. 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    Past medical history: Hepatitis C  Cirrhosis Tobacco use  Medications:  Outpatient Encounter Medications as of 06/30/2020  Medication Sig  . diclofenac (VOLTAREN) 50 MG EC tablet Take 50 mg by mouth 2 (two) times daily.  . diclofenac sodium (VOLTAREN) 1 % GEL Apply 2 g topically 4 (four) times daily. Rub into affected area of foot 2 to 4 times daily (Patient not taking: Reported on 06/30/2020)  . diclofenac Sodium (VOLTAREN) 1 % GEL Apply 2 g topically 4 (four) times daily. Rub into affected area of foot 2 to 4 times daily (Patient not taking: Reported on 06/30/2020)   No facility-administered encounter medications on file as of 06/30/2020.    Allergies: No Known Allergies  Family History: History reviewed. No pertinent family history.  Social History: Social History   Tobacco Use  . Smoking  status: Current Every Day Smoker    Types: Cigarettes  . Smokeless tobacco: Never Used  Vaping Use  . Vaping Use: Never used  Substance Use Topics  . Alcohol use: Yes    Comment: Drinks Beer  . Drug use: No   Social History   Social History Narrative   ** Merged History Encounter **    Left Handed   Lives in a one story home   Drinks Coffee        Vital Signs:  BP (!) 148/87   Pulse 83   Ht 5\' 7"  (1.702 m)   Wt 119 lb (54 kg)   SpO2 96%   BMI 18.64 kg/m   Neurological Exam: MENTAL STATUS including orientation to time, place, person, recent and remote memory, attention span and concentration, language, and fund of knowledge is normal.  Speech is not dysarthric.  CRANIAL NERVES: II:  No visual field defects.  Unremarkable fundi.   III-IV-VI: Pupils equal round and reactive to light.  Normal conjugate, extra-ocular eye movements in all directions of gaze.  No nystagmus.  No ptosis.   V:  Normal facial sensation.    VII:  Normal facial symmetry and movements.   VIII:  Normal hearing and vestibular function.   IX-X:  Normal palatal movement.   XI:  Normal shoulder shrug and head rotation.   XII:  Normal tongue strength and range of motion, no deviation or fasciculation.  MOTOR:  No atrophy, fasciculations or abnormal movements.  No  pronator drift.   Upper Extremity:  Right  Left  Deltoid  5/5   5/5   Biceps  5/5   5/5   Triceps  5/5   5/5   Infraspinatus 5/5  5/5  Medial pectoralis 5/5  5/5  Wrist extensors  5/5   5/5   Wrist flexors  5/5   5/5   Finger extensors  5/5   5/5   Finger flexors  5/5   5/5   Dorsal interossei  5/5   5/5   Abductor pollicis  5/5   5/5   Tone (Ashworth scale)  0  0   Lower Extremity:  Right  Left  Hip flexors  5/5   5/5   Hip extensors  5/5   5/5   Adductor 5/5  5/5  Abductor 5/5  5/5  Knee flexors  5/5   5/5   Knee extensors  5/5   5/5   Dorsiflexors  5/5   5/5   Plantarflexors  5/5   5/5   Toe extensors  5/5   5/5   Toe  flexors  5/5   5/5   Tone (Ashworth scale)  0  0   MSRs:  Right        Left                  brachioradialis 2+  2+  biceps 2+  2+  triceps 2+  2+  patellar 2+  2+  ankle jerk 2+  2+  Hoffman no  no  plantar response down  down   SENSORY:  Reduced vibration distally at the toes and pin prick reduced over the fingers.  Temperature intact. Romberg's sign absent.   COORDINATION/GAIT: Normal finger-to- nose-finger. Gait narrow based and stable. Tandem and stressed gait intact.    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 It is very likely that symptoms are due to neurotoxicity from alcohol with possible overlap from vitamin deficiency caused by alcohol. Check vitamin B1, folate, copper NCS/EMG of the right arm and leg  Alcohol dependence Counseled patient on abstaining from alcohol, she agrees with my recommendations.    Thank you for allowing me to participate in patient's care.  If I can answer any additional questions, I would be pleased to do so.    Sincerely,    Syona Wroblewski K. Allena Katz, DO

## 2020-06-30 NOTE — Patient Instructions (Addendum)
Check labs  Nerve testing of the right arm and leg.  Do not apply any lotion or oil to your skin on the day of testing  Avoid alcohol, as I am concerned it is causing your tingling and numbness

## 2020-07-01 ENCOUNTER — Ambulatory Visit: Payer: BC Managed Care – PPO | Admitting: Neurology

## 2020-07-05 LAB — VITAMIN B1: Vitamin B1 (Thiamine): 7 nmol/L — ABNORMAL LOW (ref 8–30)

## 2020-07-05 LAB — COPPER, SERUM: Copper: 131 ug/dL (ref 70–175)

## 2020-07-11 ENCOUNTER — Telehealth: Payer: Self-pay | Admitting: Neurology

## 2020-07-11 NOTE — Telephone Encounter (Signed)
Please see .thanks.

## 2020-07-11 NOTE — Telephone Encounter (Signed)
Called patient and informed her that Dr. Allena Katz would like her to start taking Vitamin B 1 100 mg daily. Patient stated that a pharmacist told her that the vitamins will be in stock tonight. Patient had no other questions or concerns.

## 2020-07-28 DIAGNOSIS — M79676 Pain in unspecified toe(s): Secondary | ICD-10-CM

## 2020-08-16 ENCOUNTER — Encounter: Payer: BC Managed Care – PPO | Admitting: Neurology

## 2020-08-17 ENCOUNTER — Ambulatory Visit: Payer: BC Managed Care – PPO | Admitting: Neurology

## 2020-08-17 ENCOUNTER — Other Ambulatory Visit: Payer: Self-pay

## 2020-08-17 DIAGNOSIS — G621 Alcoholic polyneuropathy: Secondary | ICD-10-CM | POA: Diagnosis not present

## 2020-08-17 DIAGNOSIS — M5412 Radiculopathy, cervical region: Secondary | ICD-10-CM

## 2020-08-17 DIAGNOSIS — G959 Disease of spinal cord, unspecified: Secondary | ICD-10-CM

## 2020-08-17 NOTE — Procedures (Signed)
[facility]  [address] [place], [address]  [place], [place] [address] Tel: [phone] Fax:  [phone] Test Date:  08/17/2020  Patient: Kristen Bautista DOB: June 06, 1956 Physician: Nita Sickle, DO  Sex: Female Height: 5\' 7"  Ref Phys: , DO  ID#: Nita Sickle   Technician:    Patient Complaints: This is a 65 year old female referred for evaluation of bilateral hand and feet paresthesias.  NCV & EMG Findings: Extensive electrodiagnostic testing of the right upper extremity and additional studies of the left shows: 1. Bilateral median, ulnar, and mixed palmar sensory responses are within normal limits. 2. Bilateral median and ulnar motor responses are within normal limits. 3. Chronic motor axonal loss changes are seen affecting the biceps and deltoid muscles, without accompanied active denervation.    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 There is no evidence of a sensorimotor polyneuropathy.  ___________________________ 78, DO    Nerve Conduction Studies Anti Sensory Summary Table   Stim Site NR Peak (ms) Norm Peak (ms) P-T Amp (V) Norm P-T Amp  Left Median Anti Sensory (2nd Digit)  33C  Wrist    3.0 <3.8 32.3 >10  Right Sup Peroneal Anti Sensory (Ant Lat Mall)  33C  12 cm    3.4 <4.6 13.9 >3  Right Sural Anti Sensory (Lat Mall)  33C  Calf    3.1 <4.6 11.5 >3  Left Ulnar Anti Sensory (5th Digit)  33C  Wrist    2.7 <3.2 26.4 >5   Motor Summary Table   Stim Site NR Onset (ms) Norm Onset (ms) O-P Amp (mV) Norm O-P Amp Site1 Site2 Delta-0 (ms) Dist (cm) Vel (m/s) Norm Vel (m/s)  Left Median Motor (Abd Poll Brev)  33C  Wrist    3.4 <4.0 8.0 >5 Elbow Wrist 5.7 29.0 51 >50  Elbow    9.1  7.8         Right Peroneal Motor (Ext Dig Brev)  33C  Ankle    4.3 <6.0 4.5 >2.5 B Fib Ankle 8.6 37.0 43 >40  B Fib    12.9  3.8  Poplt B Fib 1.3 7.0 54 >40  Poplt    14.2  3.6         Right Tibial Motor (Abd  Hall Brev)  33C  Ankle    4.8 <6.0 6.8 >4 Knee Ankle 9.9 45.0 45 >40  Knee    14.7  6.2         Left Ulnar Motor (Abd Dig Minimi)  33C  Wrist    3.0 <3.1 9.6 >7 B Elbow Wrist 3.4 21.0 62 >50  B Elbow    6.4  9.0  A Elbow B Elbow 1.8 10.0 56 >50  A Elbow    8.2  8.9          Comparison Summary Table   Stim Site NR Peak (ms) Norm Peak (ms) P-T Amp (V) Site1 Site2 Delta-P (ms) Norm Delta (ms)  Left Median/Ulnar Palm Comparison (Wrist - 8cm)  33C  Median Palm    1.8 <2.2 83.6 Median Palm Ulnar Palm 0.0   Ulnar Palm    1.8 <2.2 14.4       H Reflex Studies   NR H-Lat (ms) Lat Norm (ms) L-R H-Lat (ms)  Right Tibial (Gastroc)  33C     34.97 <35    EMG   Side Muscle Ins Act Fibs Psw Fasc Number Recrt Dur Dur. Amp Amp. Poly Poly. Comment  Right AntTibialis Nml Nml Nml Nml Nml Nml Nml Nml Nml Nml Nml Nml N/A  Right Gastroc Nml Nml Nml Nml Nml Nml Nml Nml Nml Nml Nml Nml N/A  Right Flex Dig Long Nml Nml Nml Nml Nml Nml Nml Nml Nml Nml Nml Nml N/A  Right RectFemoris Nml Nml Nml Nml Nml Nml Nml Nml Nml Nml Nml Nml N/A  Right GluteusMed Nml Nml Nml Nml Nml Nml Nml Nml Nml Nml Nml Nml N/A  Left 1stDorInt Nml Nml Nml Nml Nml Nml Nml Nml Nml Nml Nml Nml N/A  Left PronatorTeres Nml Nml Nml Nml Nml Nml Nml Nml Nml Nml Nml Nml N/A  Left Biceps Nml Nml Nml Nml 1- Rapid Many 1+ Many 1+ Many 1+ N/A  Left Triceps Nml Nml Nml Nml Nml Nml Nml Nml Nml Nml Nml Nml N/A  Left Deltoid Nml Nml Nml Nml 1- Rapid Many 1+ Many 1+ Many 1+ N/A      Waveforms:

## 2020-08-17 NOTE — Progress Notes (Signed)
    Follow-up Visit   Date: 08/17/20   CAELAN ATCHLEY MRN: [id number] DOB: [birth year]   Interim History: DANIELYS MADRY is a 65 y.o. left-handed female with hepatitis C, tobacco use, and alcohol dependency returning to the clinic for follow-up of bilateral hands and feet tingling and EDX.  The patient was accompanied to the clinic by self.  History of present illness: Over the past several months, she began having tingling over the fingertip and more recently, she reports numbness over the soles of the feet. Symptoms are constant and more bothersome in the hands.  She also complains of stiffness in the hands and achy right heel/ankle pain.  No falls or imbalance.  She denies weakness.  She admits to drinking 2-3 beers nightly for 40+ years and more on the weekends.  Her TSH and vitamin B12 level was check by her PCP and normal. 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    UPDATE 08/17/2020:  She is here for EDX, see below for results.   Medications:  Current Outpatient Medications on File Prior to Visit  Medication Sig Dispense Refill  . diclofenac (VOLTAREN) 50 MG EC tablet Take 50 mg by mouth 2 (two) times daily.    . diclofenac sodium (VOLTAREN) 1 % GEL Apply 2 g topically 4 (four) times daily. Rub into affected area of foot 2 to 4 times daily (Patient not taking: Reported on 06/30/2020) 100 g 2  . diclofenac Sodium (VOLTAREN) 1 % GEL Apply 2 g topically 4 (four) times daily. Rub into affected area of foot 2 to 4 times daily (Patient not taking: Reported on 06/30/2020) 100 g 2   No current facility-administered medications on file prior to visit.    Allergies: No Known Allergies  Vital Signs:  There were no vitals taken for this visit.  Neurological Exam: MENTAL STATUS including orientation to time, place, person, recent and remote memory, attention span and concentration, language, and fund of knowledge is normal.  Speech is not dysarthric  MOTOR:  Motor strength is 5/5 in all  extremities.  No atrophy, fasciculations or abnormal movements.  No pronator drift.  Tone is normal.    MSRs:  Reflexes are 3+/4 throughout.  SENSORY:  Vibration reduced at the feet  COORDINATION/GAIT:   Gait narrow based and stable.   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  IMPRESSION/PLAN: Cervical radiculopathy at C5-6 with probable cervical canal stenosis manifesting with paresthesias of the hands >> feet.  I am concerned that her sensory complaints are stemming from myelopathy given the absence of neuropathy or entrapment neuropathy on her NCS.  MRI cervical spine recommended, however patient unable to afford testing and says that she still owes $800 on a MRI for her foot.  Recommend that patient apply for [facility] financial assistance.     Thank you for allowing me to participate in patient's care.  If I can answer any additional questions, I would be pleased to do so.    Sincerely,    Chrissy Ealey K. Allena Katz, DO

## 2021-03-18 ENCOUNTER — Encounter (HOSPITAL_COMMUNITY): Payer: Self-pay | Admitting: Emergency Medicine

## 2021-03-18 ENCOUNTER — Emergency Department (HOSPITAL_COMMUNITY)
Admission: EM | Admit: 2021-03-18 | Discharge: 2021-03-18 | Disposition: A | Discharge status: Home / Self Care | Payer: Self-pay | Attending: Emergency Medicine | Admitting: Emergency Medicine

## 2021-03-18 ENCOUNTER — Other Ambulatory Visit: Payer: Self-pay

## 2021-03-18 ENCOUNTER — Emergency Department (HOSPITAL_COMMUNITY): Payer: Self-pay

## 2021-03-18 DIAGNOSIS — S60416A Abrasion of right little finger, initial encounter: Secondary | ICD-10-CM | POA: Diagnosis not present

## 2021-03-18 DIAGNOSIS — S63501A Unspecified sprain of right wrist, initial encounter: Secondary | ICD-10-CM | POA: Insufficient documentation

## 2021-03-18 DIAGNOSIS — S0181XA Laceration without foreign body of other part of head, initial encounter: Secondary | ICD-10-CM | POA: Insufficient documentation

## 2021-03-18 DIAGNOSIS — Y9301 Activity, walking, marching and hiking: Secondary | ICD-10-CM | POA: Diagnosis not present

## 2021-03-18 DIAGNOSIS — F17210 Nicotine dependence, cigarettes, uncomplicated: Secondary | ICD-10-CM | POA: Insufficient documentation

## 2021-03-18 DIAGNOSIS — W109XXA Fall (on) (from) unspecified stairs and steps, initial encounter: Secondary | ICD-10-CM | POA: Insufficient documentation

## 2021-03-18 DIAGNOSIS — W19XXXA Unspecified fall, initial encounter: Secondary | ICD-10-CM

## 2021-03-18 DIAGNOSIS — Z23 Encounter for immunization: Secondary | ICD-10-CM | POA: Diagnosis not present

## 2021-03-18 DIAGNOSIS — Y92481 Parking lot as the place of occurrence of the external cause: Secondary | ICD-10-CM | POA: Diagnosis not present

## 2021-03-18 DIAGNOSIS — S01411A Laceration without foreign body of right cheek and temporomandibular area, initial encounter: Secondary | ICD-10-CM | POA: Diagnosis not present

## 2021-03-18 DIAGNOSIS — S6991XA Unspecified injury of right wrist, hand and finger(s), initial encounter: Secondary | ICD-10-CM | POA: Diagnosis present

## 2021-03-18 IMAGING — CT CT MAXILLOFACIAL W/O CM
3 series · 16 of 47 positions shown, 19 images · non-contrast
Comparison: Head CT today.

CLINICAL DATA: 65-year-old female status post fall. Right cheek
laceration.

EXAM:
CT MAXILLOFACIAL WITHOUT CONTRAST
TECHNIQUE: Multidetector CT imaging of the maxillofacial structures was
performed. Multiplanar CT image reconstructions were also generated.

[Series 3: facialbone 2.0 st · axial · 0.35mm/px · z∈[-166,-28]mm · 10 of 81 slices shown, 13 images]
[im 6/81  brain]
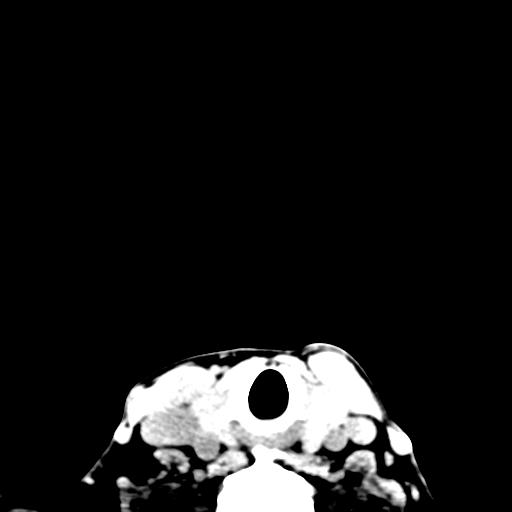
[im 6/81  bone]
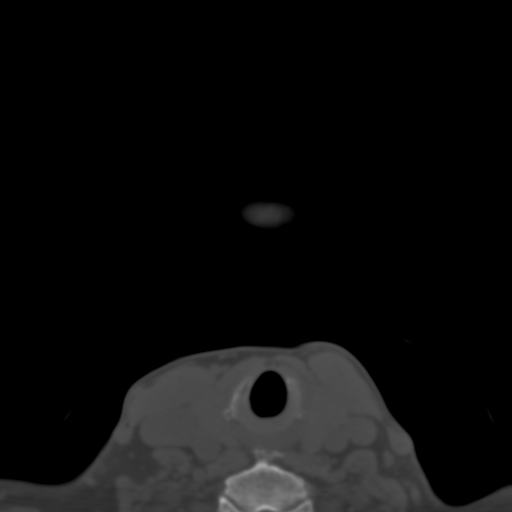
[im 14/81  bone]
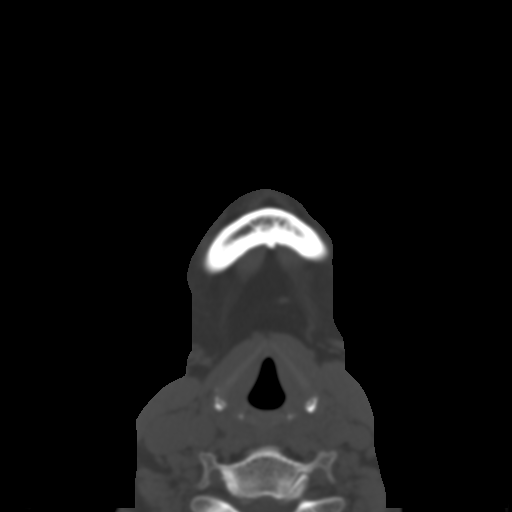
[im 23/81  bone]
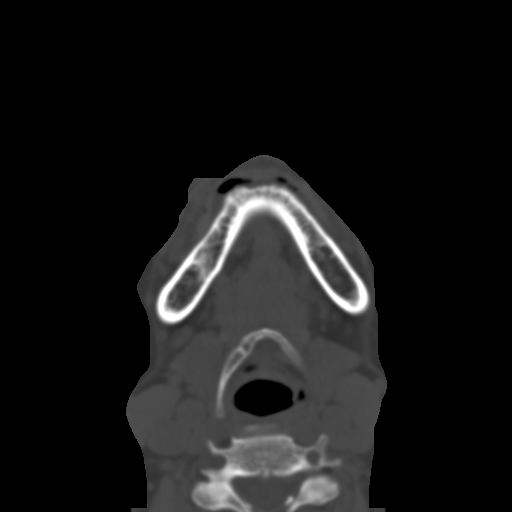
[im 28/81  bone]
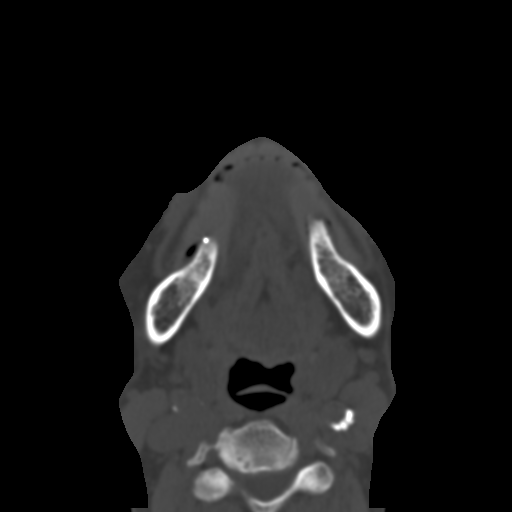
[im 36/81  brain]
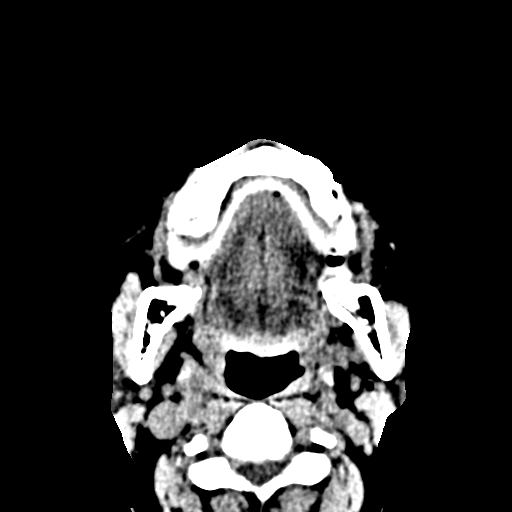
[im 36/81  bone]
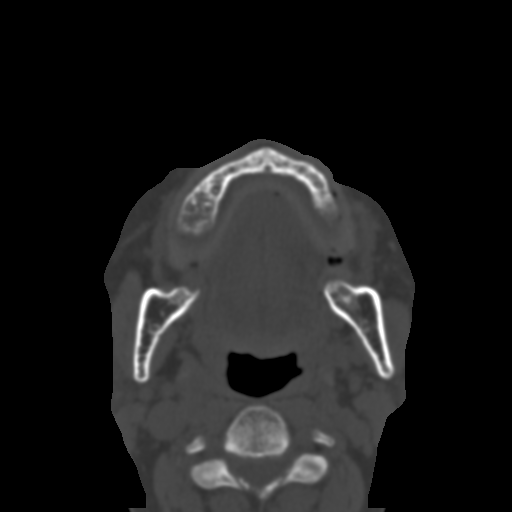
[im 45/81  bone]
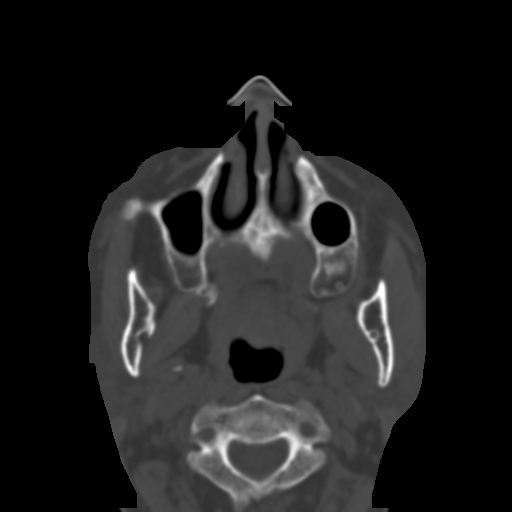
[im 53/81  bone]
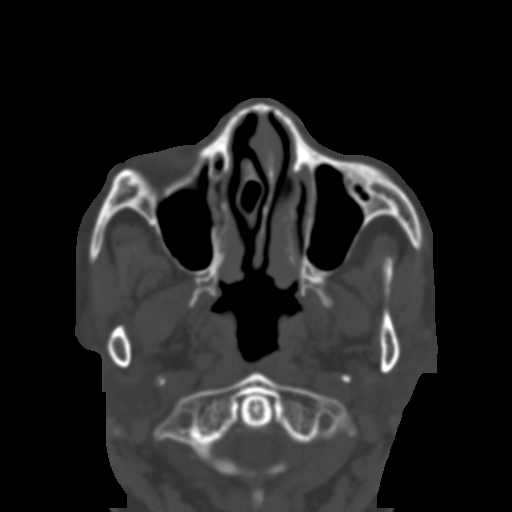
[im 61/81  bone]
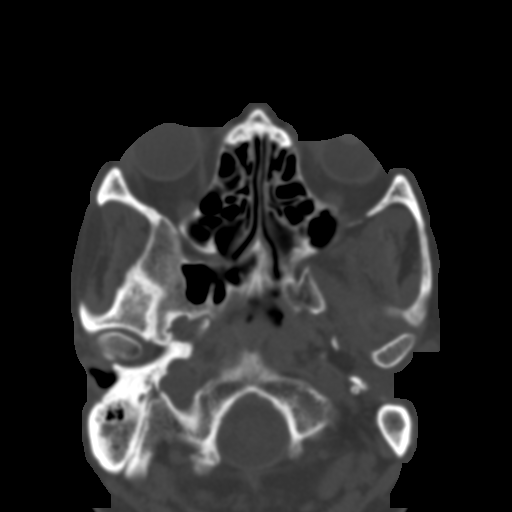
[im 67/81  brain]
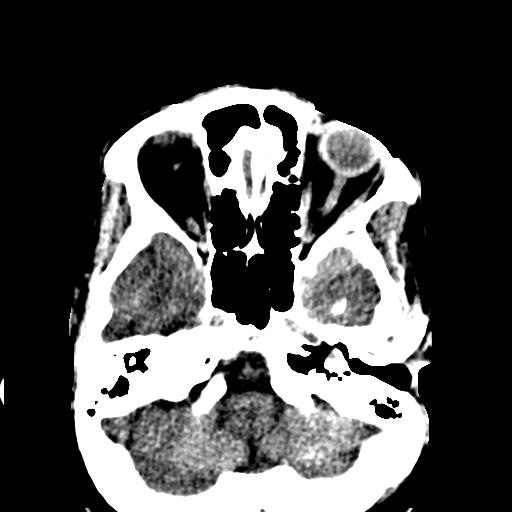
[im 67/81  bone]
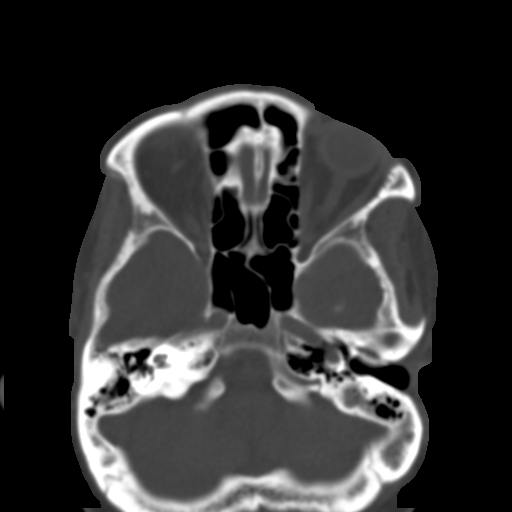
[im 75/81  bone]
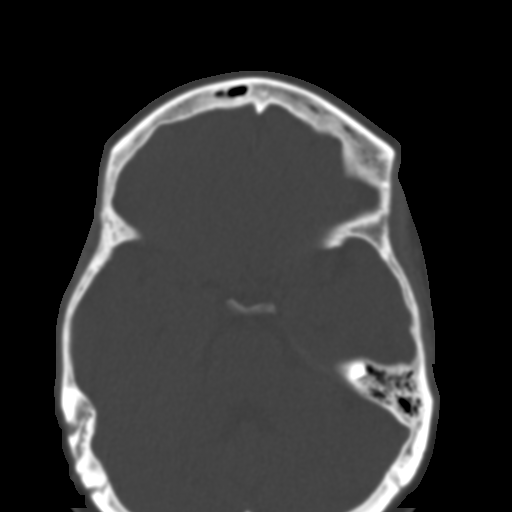

[Series 9: facialbone 2.0 cor st · coronal · 0.33mm/px · 3 of 71 slices shown]
[im 24/71  bone]
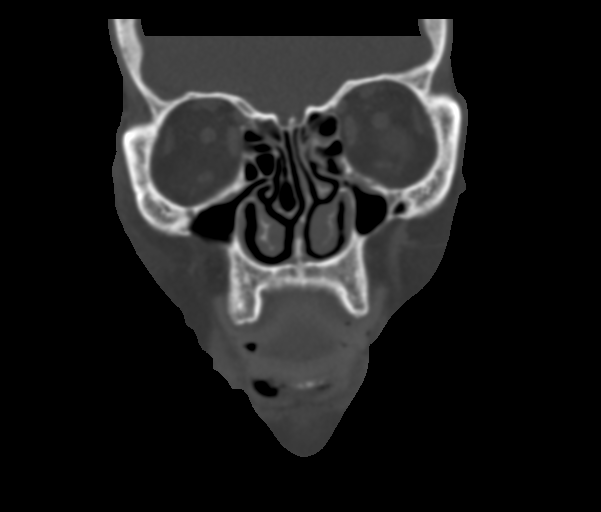
[im 32/71  bone]
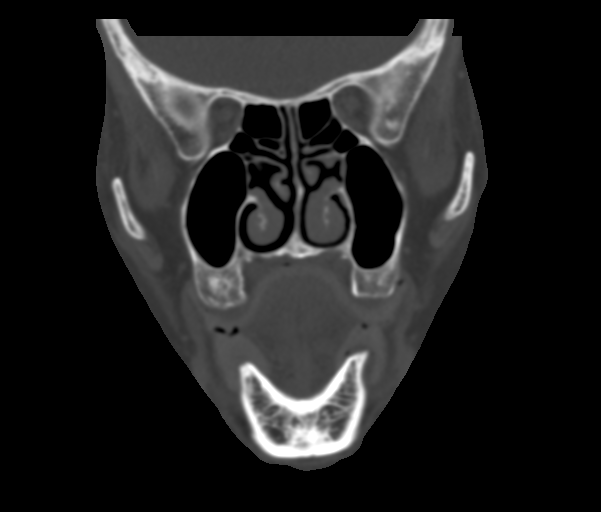
[im 39/71  bone]
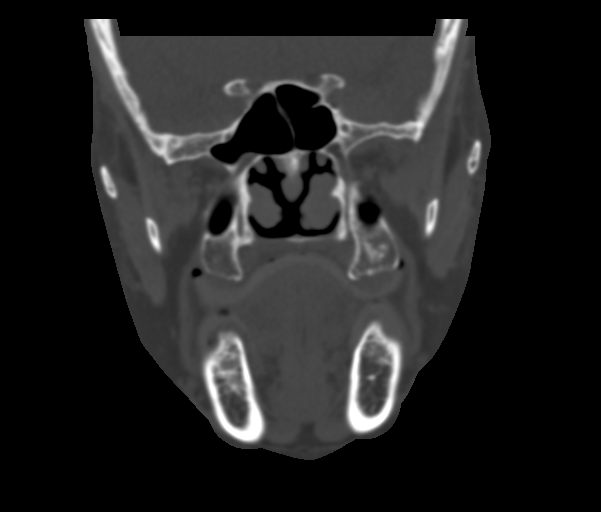

[Series 10: facialbone 2.0 sag st · sagittal · 0.30mm/px · 3 of 76 slices shown]
[im 26/76  bone]
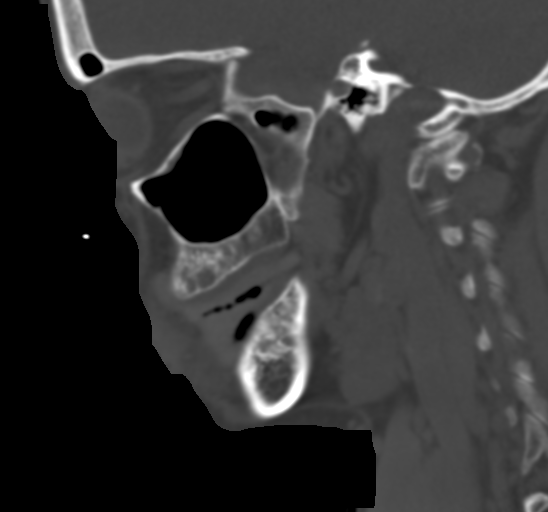
[im 38/76  bone]
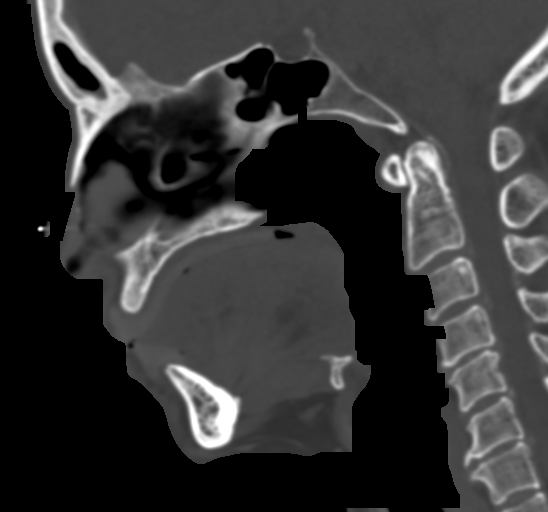
[im 51/76  bone]
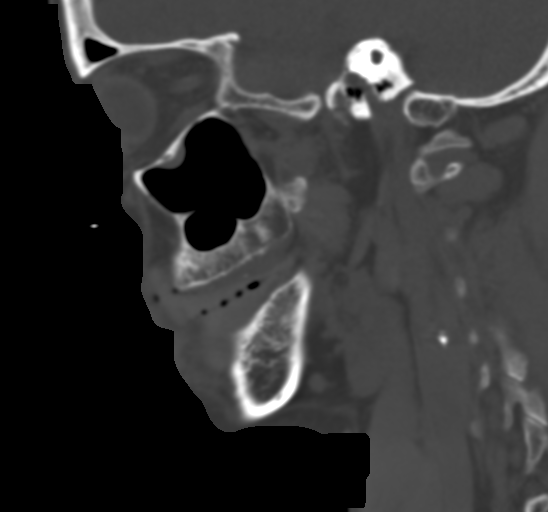

[16 of 47 positions shown; findings below may reference images not displayed]

FINDINGS: Osseous: Absent dentition. Mandible intact and normally located.
Maxilla, zygoma, pterygoid, and nasal bones appear intact. Central
skull base intact. Visible cervical vertebrae appear intact and
aligned.

Orbits: Intact orbital walls. Orbits soft tissues appears symmetric
and normal.

Sinuses: Trace paranasal sinus mucosal thickening. No sinus fluid
levels. Tympanic cavities and mastoids are clear.

Soft tissues: Negative visible noncontrast thyroid, larynx, pharynx,
parapharyngeal spaces, retropharyngeal space, sublingual space,
submandibular spaces, masticator and parotid spaces. Superficial
skin injury over the right anterior zygoma series 3, image 49. No
subcutaneous gas. No discrete hematoma. Left greater than right
calcified carotid atherosclerosis.

Limited intracranial: Stable to that reported separately today.
IMPRESSION: 1. Superficial skin injury over the right anterior zygoma.
2. No facial fracture identified.

## 2021-03-18 IMAGING — CR DG WRIST COMPLETE 3+V*R*
4 series · 4 of 4 positions shown · non-contrast
Comparison: None.

CLINICAL DATA: Post fall this morning, now with right wrist pain.

EXAM:
RIGHT WRIST - COMPLETE 3+ VIEW

[wrist pa]
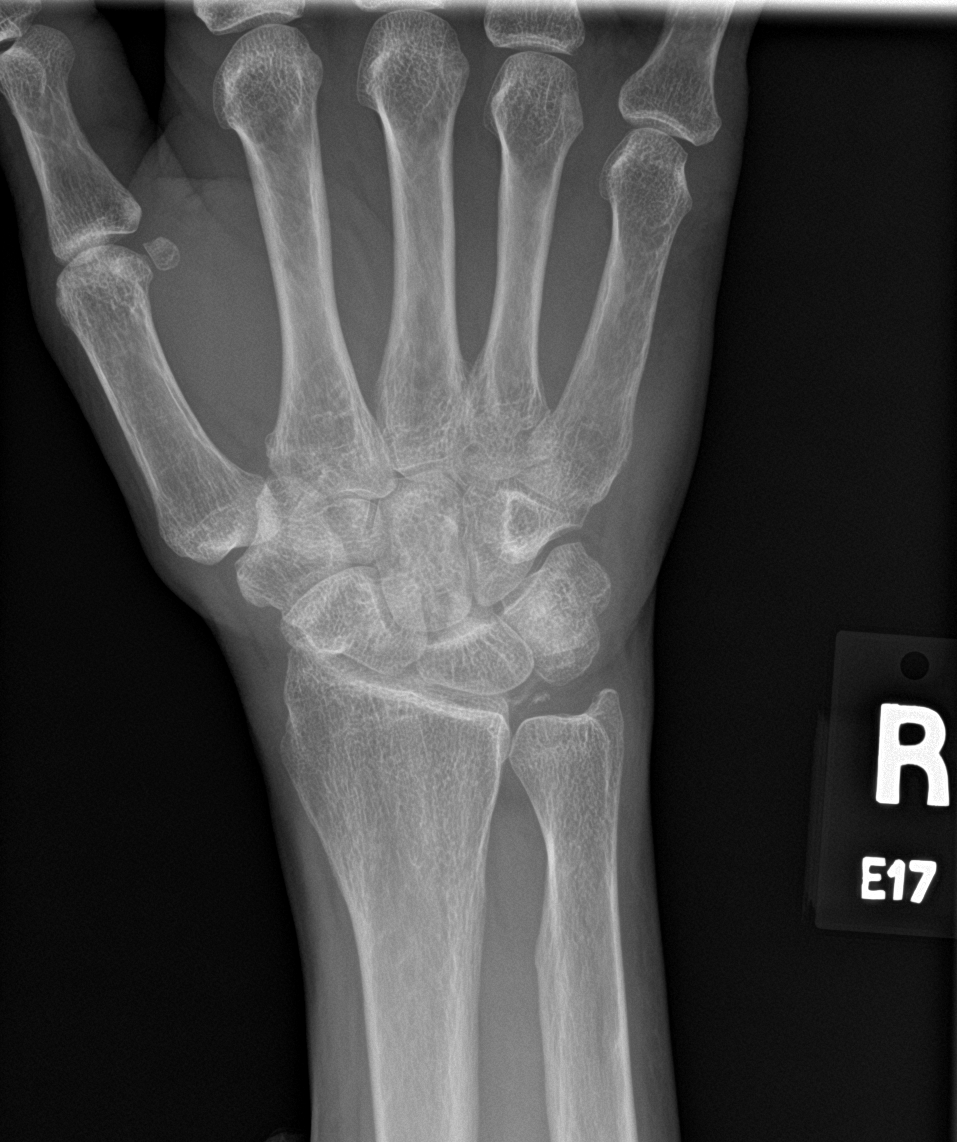

[wrist obl]
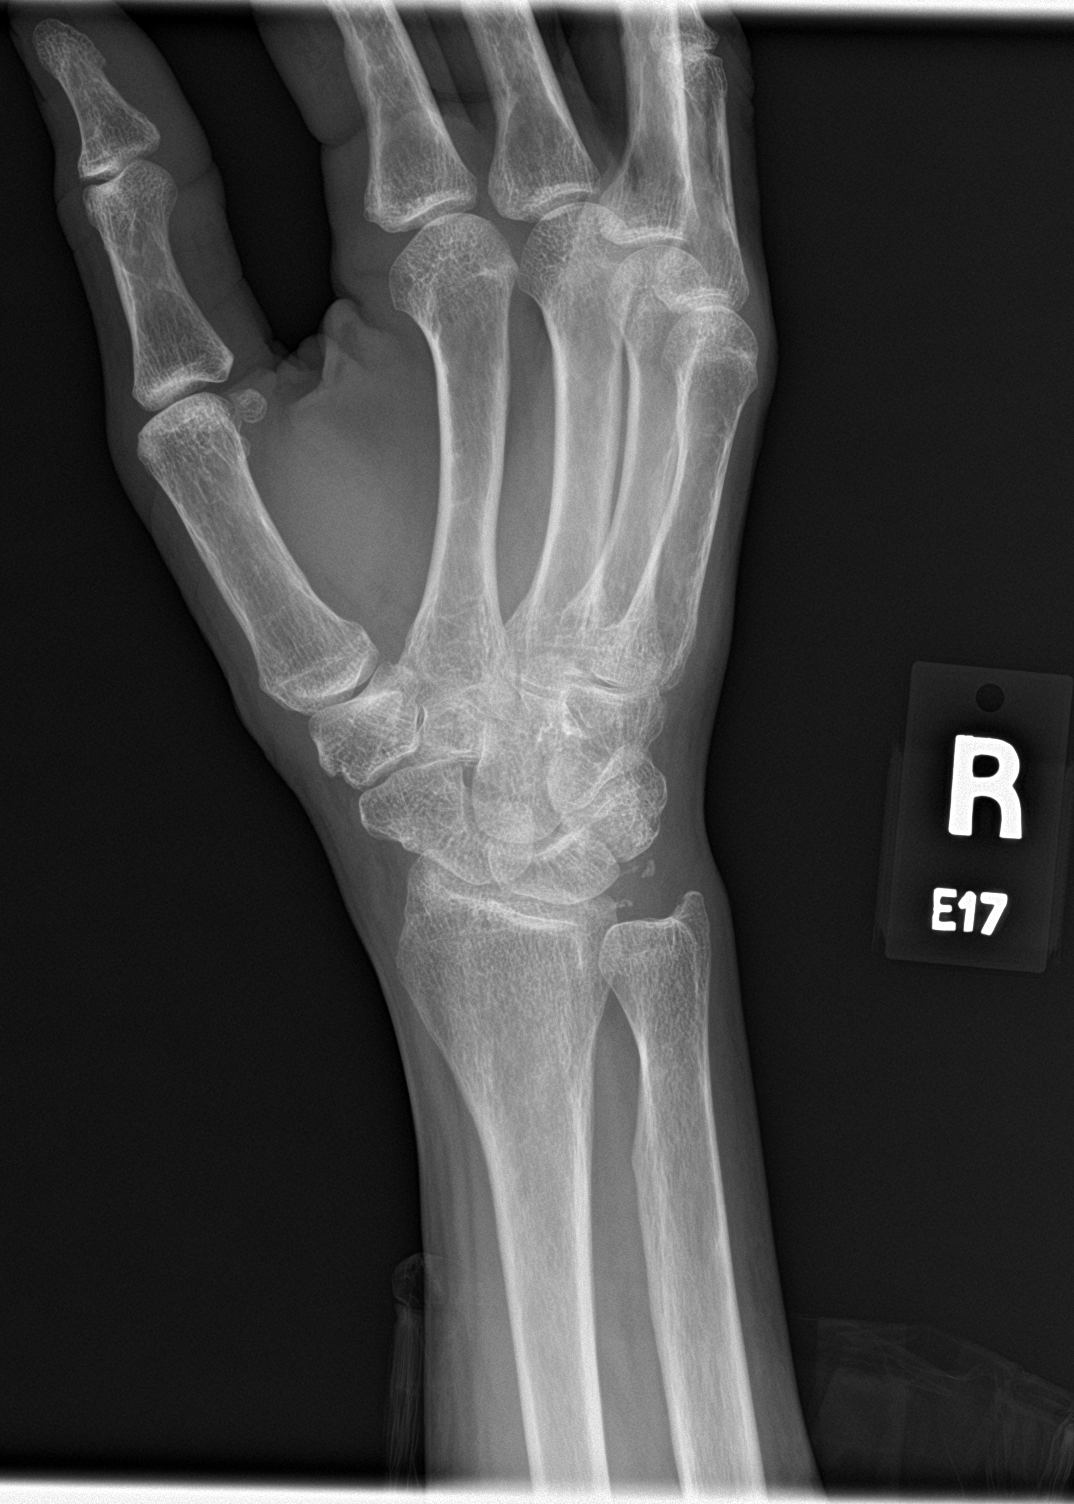

[wrist lat]
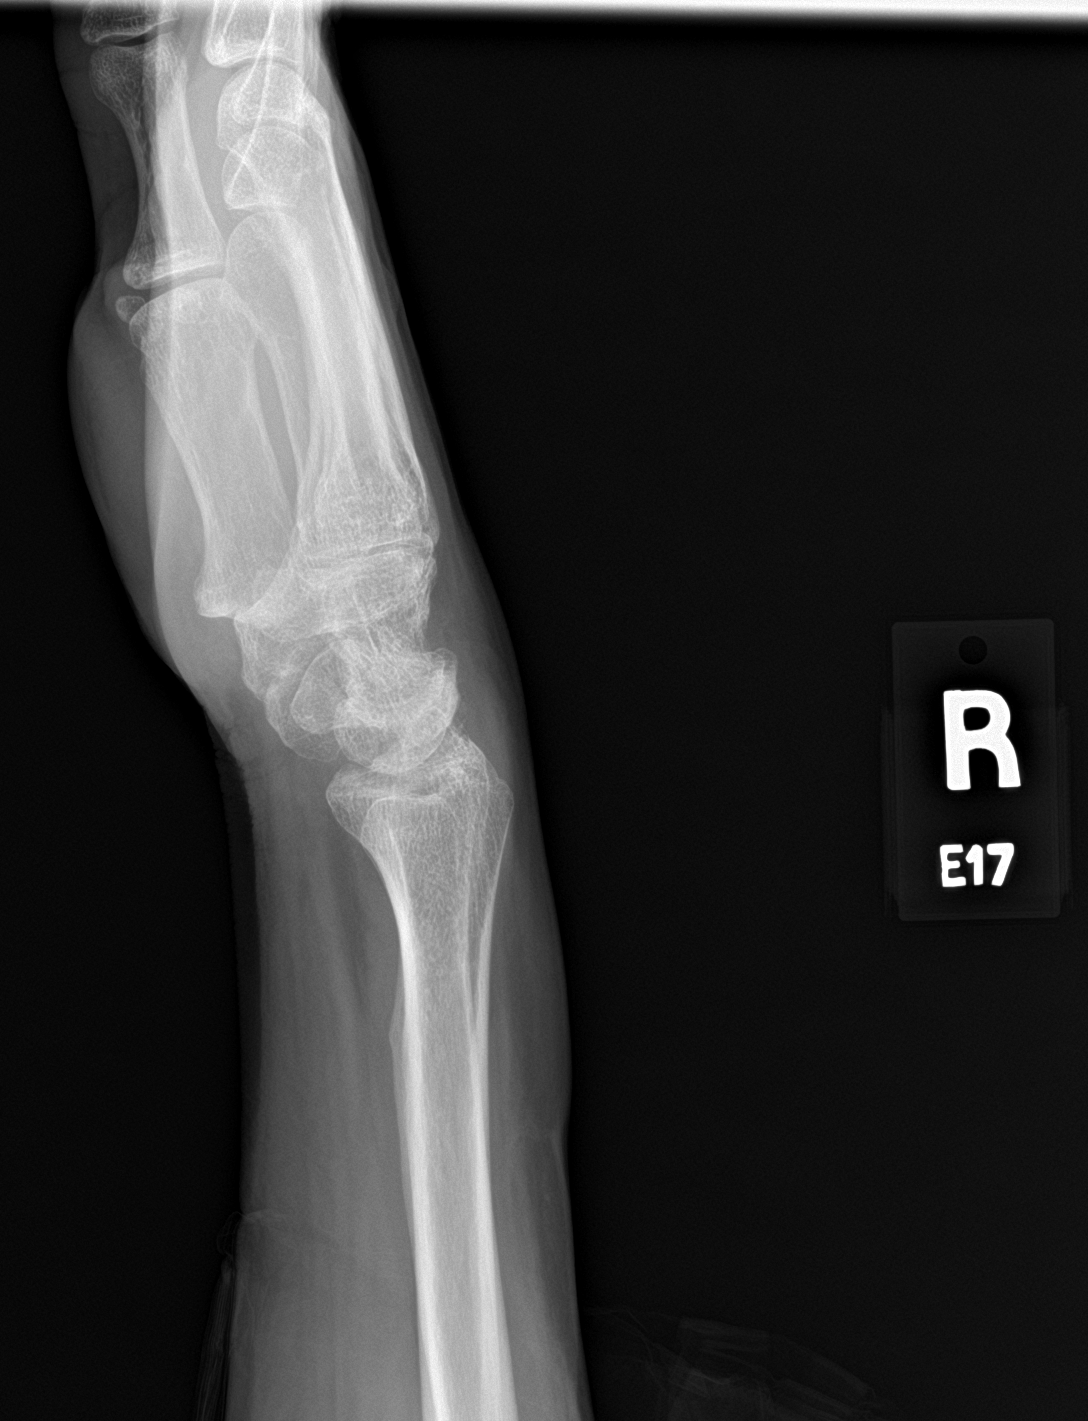

[wrist navicular]
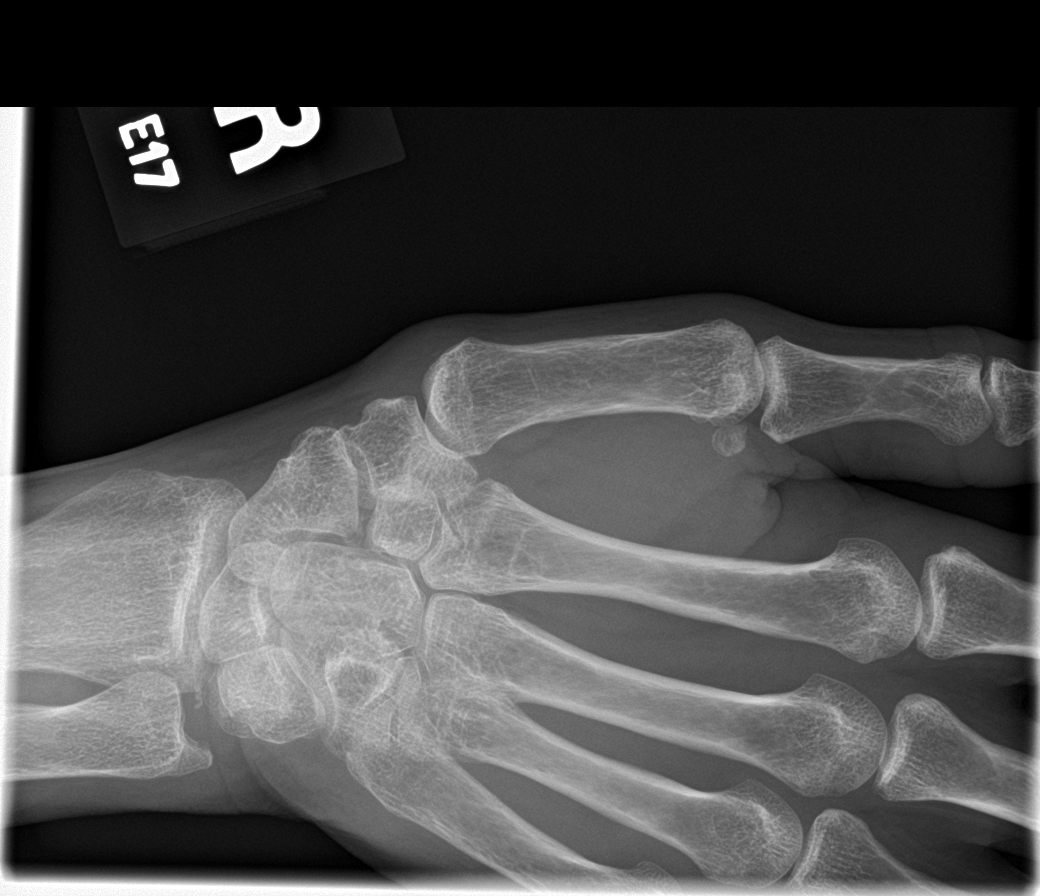

[4 of 4 positions shown; findings below may reference images not displayed]

FINDINGS: Diffuse soft tissue swelling about the wrist without associated
fracture or dislocation. Chondrocalcinosis is seen within the TFCC.
Joint spaces appear preserved. No discrete erosions. No radiopaque
foreign body.
IMPRESSION: 1. Diffuse soft tissue swelling about the wrist without associated
fracture or dislocation. If the patient has pain referable to the
anatomic snuff box, splinting and a follow-up radiograph in 10 to 14
days is recommended to evaluate for occult scaphoid fracture.
2. Chondrocalcinosis within the TFCC as could be seen in the setting
of CPPD.

## 2021-03-18 MED ORDER — TETANUS-DIPHTH-ACELL PERTUSSIS 5-2.5-18.5 LF-MCG/0.5 IM SUSY
0.5000 mL | PREFILLED_SYRINGE | Freq: Once | INTRAMUSCULAR | Status: AC
  Administered 2021-03-18: 0.5 mL via INTRAMUSCULAR
  Filled 2021-03-18: qty 0.5

## 2021-03-18 MED ORDER — LIDOCAINE-EPINEPHRINE (PF) 2 %-1:200000 IJ SOLN
10.0000 mL | Freq: Once | INTRAMUSCULAR | Status: DC
  Filled 2021-03-18: qty 20

## 2021-03-18 NOTE — Discharge Instructions (Addendum)
Keep your skin glue clean and dry.  Do not apply any antibiotic ointment or Vaseline to your face. Apply ice to your right cheek as well as your right hand for 20 minutes at a time over a thin soft to help with pain and swelling.  Recheck with your doctor in 2 days.  Return to the emergency room for any worsening or concerning symptoms.
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
## 2021-03-18 NOTE — ED Notes (Signed)
Discharged by PA at triage. 

## 2021-08-18 ENCOUNTER — Other Ambulatory Visit: Payer: Self-pay | Admitting: Family Medicine

## 2021-08-18 DIAGNOSIS — Z1231 Encounter for screening mammogram for malignant neoplasm of breast: Secondary | ICD-10-CM

## 2021-08-18 DIAGNOSIS — E2839 Other primary ovarian failure: Secondary | ICD-10-CM

## 2021-08-18 DIAGNOSIS — Z87891 Personal history of nicotine dependence: Secondary | ICD-10-CM

## 2021-08-18 DIAGNOSIS — R9431 Abnormal electrocardiogram [ECG] [EKG]: Secondary | ICD-10-CM

## 2021-09-18 ENCOUNTER — Other Ambulatory Visit: Payer: Self-pay

## 2021-09-18 ENCOUNTER — Ambulatory Visit (HOSPITAL_COMMUNITY): Admission: EM | Admit: 2021-09-18 | Discharge: 2021-09-18 | Payer: Medicare Other

## 2021-09-18 ENCOUNTER — Emergency Department (HOSPITAL_COMMUNITY): Payer: Medicare Other

## 2021-09-18 ENCOUNTER — Encounter (HOSPITAL_COMMUNITY): Payer: Self-pay

## 2021-09-18 ENCOUNTER — Emergency Department (HOSPITAL_COMMUNITY)
Admission: EM | Admit: 2021-09-18 | Discharge: 2021-09-18 | Disposition: A | Discharge status: Home / Self Care | Payer: Medicare Other | Attending: Emergency Medicine | Admitting: Emergency Medicine

## 2021-09-18 DIAGNOSIS — R519 Headache, unspecified: Secondary | ICD-10-CM

## 2021-09-18 DIAGNOSIS — M62830 Muscle spasm of back: Secondary | ICD-10-CM | POA: Insufficient documentation

## 2021-09-18 DIAGNOSIS — M25561 Pain in right knee: Secondary | ICD-10-CM

## 2021-09-18 DIAGNOSIS — R42 Dizziness and giddiness: Secondary | ICD-10-CM

## 2021-09-18 DIAGNOSIS — S3992XA Unspecified injury of lower back, initial encounter: Secondary | ICD-10-CM | POA: Diagnosis not present

## 2021-09-18 DIAGNOSIS — S0990XA Unspecified injury of head, initial encounter: Secondary | ICD-10-CM | POA: Diagnosis present

## 2021-09-18 DIAGNOSIS — S199XXA Unspecified injury of neck, initial encounter: Secondary | ICD-10-CM | POA: Insufficient documentation

## 2021-09-18 DIAGNOSIS — Y92410 Unspecified street and highway as the place of occurrence of the external cause: Secondary | ICD-10-CM | POA: Diagnosis not present

## 2021-09-18 DIAGNOSIS — S8991XA Unspecified injury of right lower leg, initial encounter: Secondary | ICD-10-CM | POA: Insufficient documentation

## 2021-09-18 DIAGNOSIS — R531 Weakness: Secondary | ICD-10-CM
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
## 2021-09-18 MED ORDER — CYCLOBENZAPRINE HCL 10 MG PO TABS: 10.0000 mg | ORAL_TABLET | Freq: Two times a day (BID) | ORAL | 0 refills | Status: DC | PRN

## 2021-09-18 MED ORDER — LIDOCAINE 5 % EX PTCH: 1.0000 | MEDICATED_PATCH | CUTANEOUS | 0 refills | Status: DC
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
## 2021-09-18 NOTE — ED Notes (Signed)
Pt not in lobby at this time.

## 2021-09-19 ENCOUNTER — Ambulatory Visit
Admission: RE | Admit: 2021-09-19 | Discharge: 2021-09-19 | Disposition: A | Discharge status: Home / Self Care | Payer: No Typology Code available for payment source | Source: Ambulatory Visit | Attending: Family Medicine | Admitting: Family Medicine

## 2021-09-19 DIAGNOSIS — R9431 Abnormal electrocardiogram [ECG] [EKG]: Secondary | ICD-10-CM

## 2021-09-19 DIAGNOSIS — Z87891 Personal history of nicotine dependence: Secondary | ICD-10-CM

## 2021-09-28 ENCOUNTER — Ambulatory Visit
Admission: RE | Admit: 2021-09-28 | Discharge: 2021-09-28 | Disposition: A | Discharge status: Home / Self Care | Payer: Medicare Other | Source: Ambulatory Visit | Attending: Physician Assistant | Admitting: Physician Assistant

## 2021-09-28 ENCOUNTER — Other Ambulatory Visit: Payer: Self-pay | Admitting: Physician Assistant

## 2021-09-28 DIAGNOSIS — R52 Pain, unspecified: Secondary | ICD-10-CM

## 2021-09-28 IMAGING — DX DG FOOT COMPLETE 3+V*R*
3 series · 3 of 3 positions shown · non-contrast
Comparison: Right foot radiographs [DATE]

CLINICAL DATA: Medial ankle and instead pain for 2 weeks status
post fall.

EXAM:
RIGHT FOOT COMPLETE - 3+ VIEW

[dg foot complete right (1 of 3)]
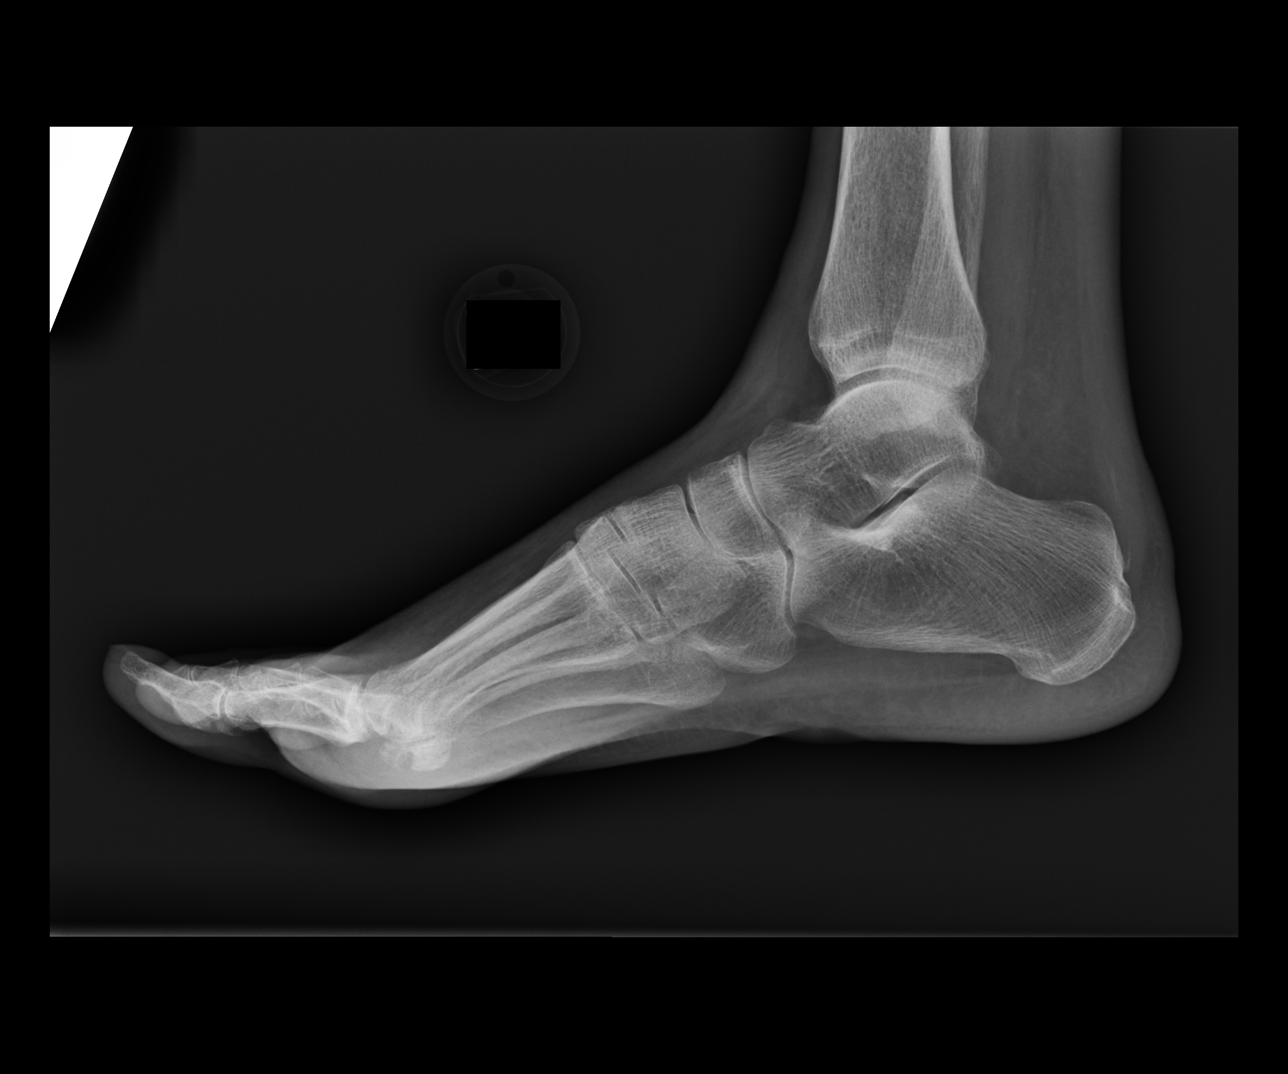

[dg foot complete right (2 of 3)]
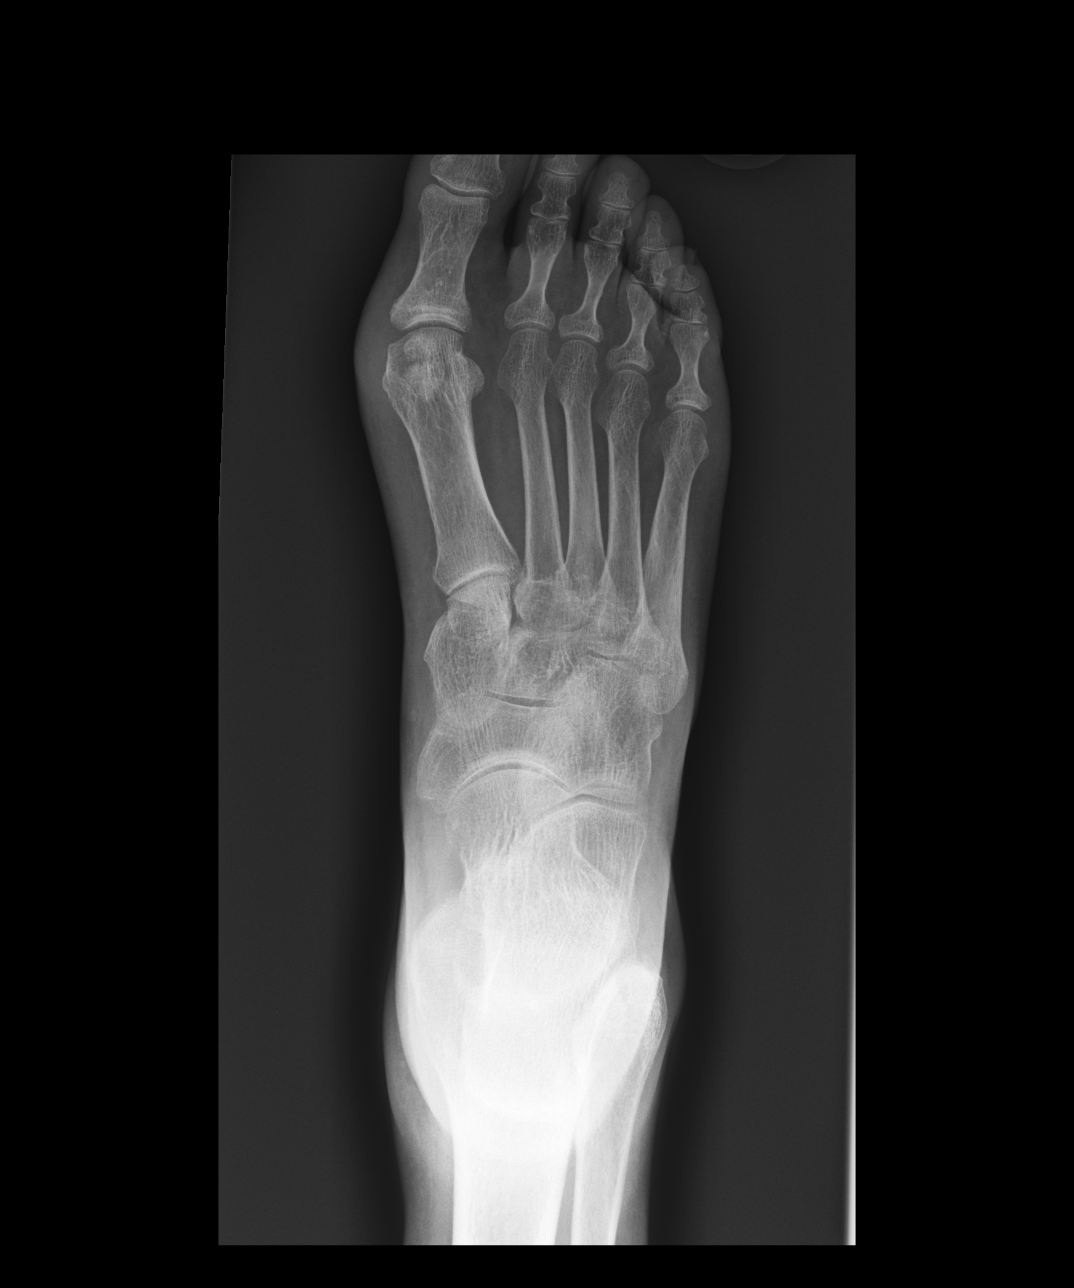

[dg foot complete right (3 of 3)]
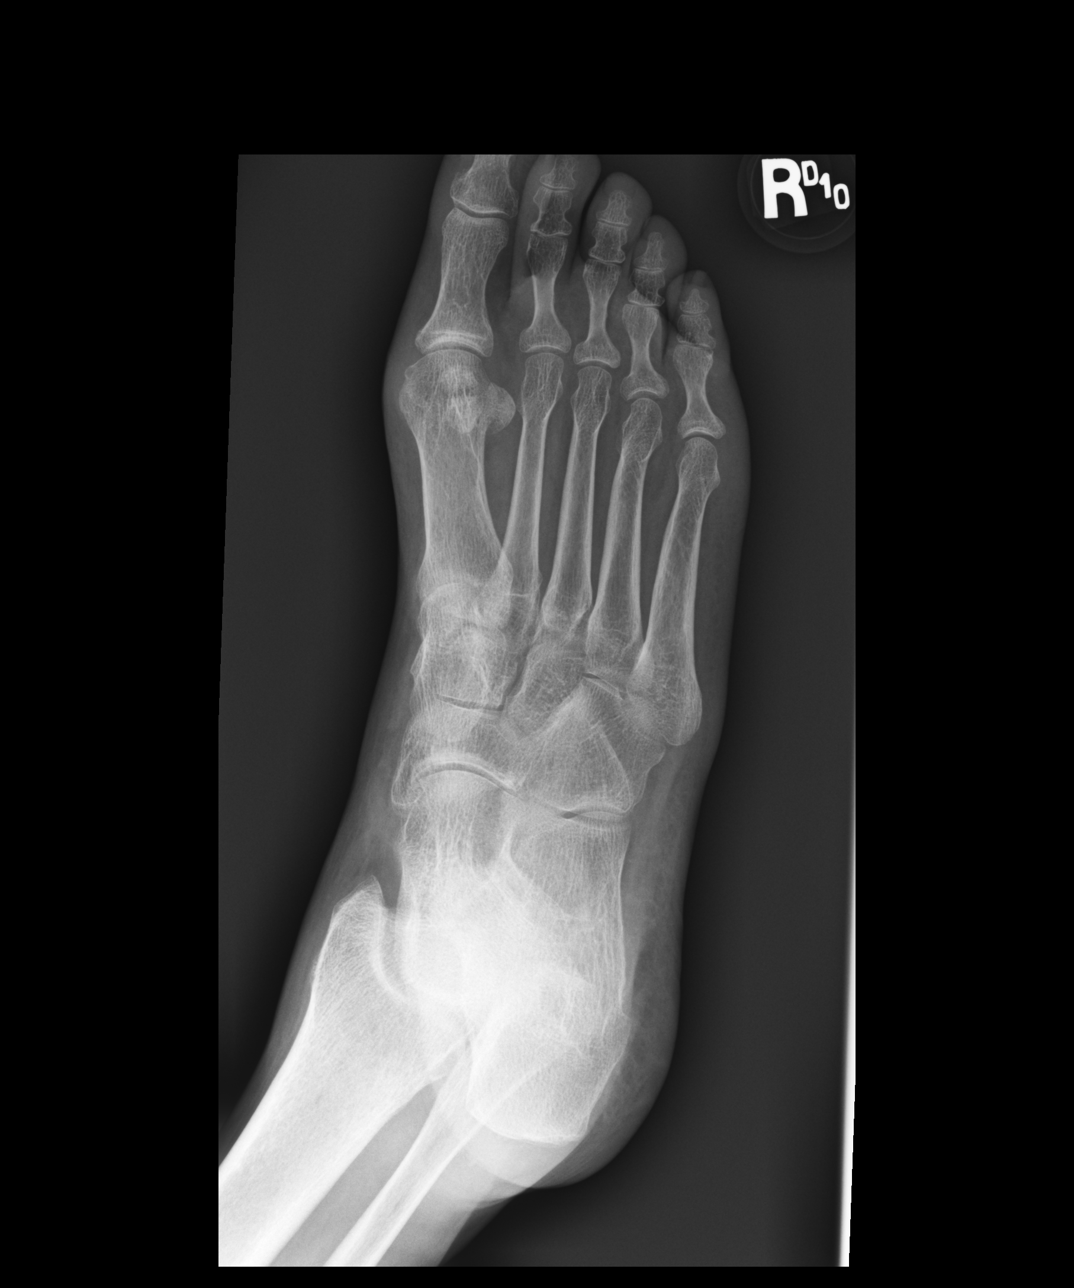

[3 of 3 positions shown; findings below may reference images not displayed]

FINDINGS: Right ankle:

Mild distal medial malleolar degenerative osteophytosis. The ankle
mortise is symmetric and intact. No acute fracture or dislocation.

Right foot:

Minimal hallux valgus. Minimal great toe metatarsophalangeal joint
space narrowing. Mild joint space narrowing of the interphalangeal
joints diffusely. No acute fracture or dislocation.
IMPRESSION: Minimal hallux valgus and great toe metatarsophalangeal joint
osteoarthritis.

Mild interphalangeal joint osteoarthritis diffusely.

## 2021-09-28 IMAGING — DX DG ANKLE COMPLETE 3+V*R*
3 series · 3 of 3 positions shown · non-contrast
Comparison: Right foot radiographs [DATE]

CLINICAL DATA: Right medial ankle and instep foot pain related to a
fall 2 weeks ago.

EXAM:
RIGHT ANKLE - COMPLETE 3+ VIEW

[dg ankle complete right (1 of 3)]
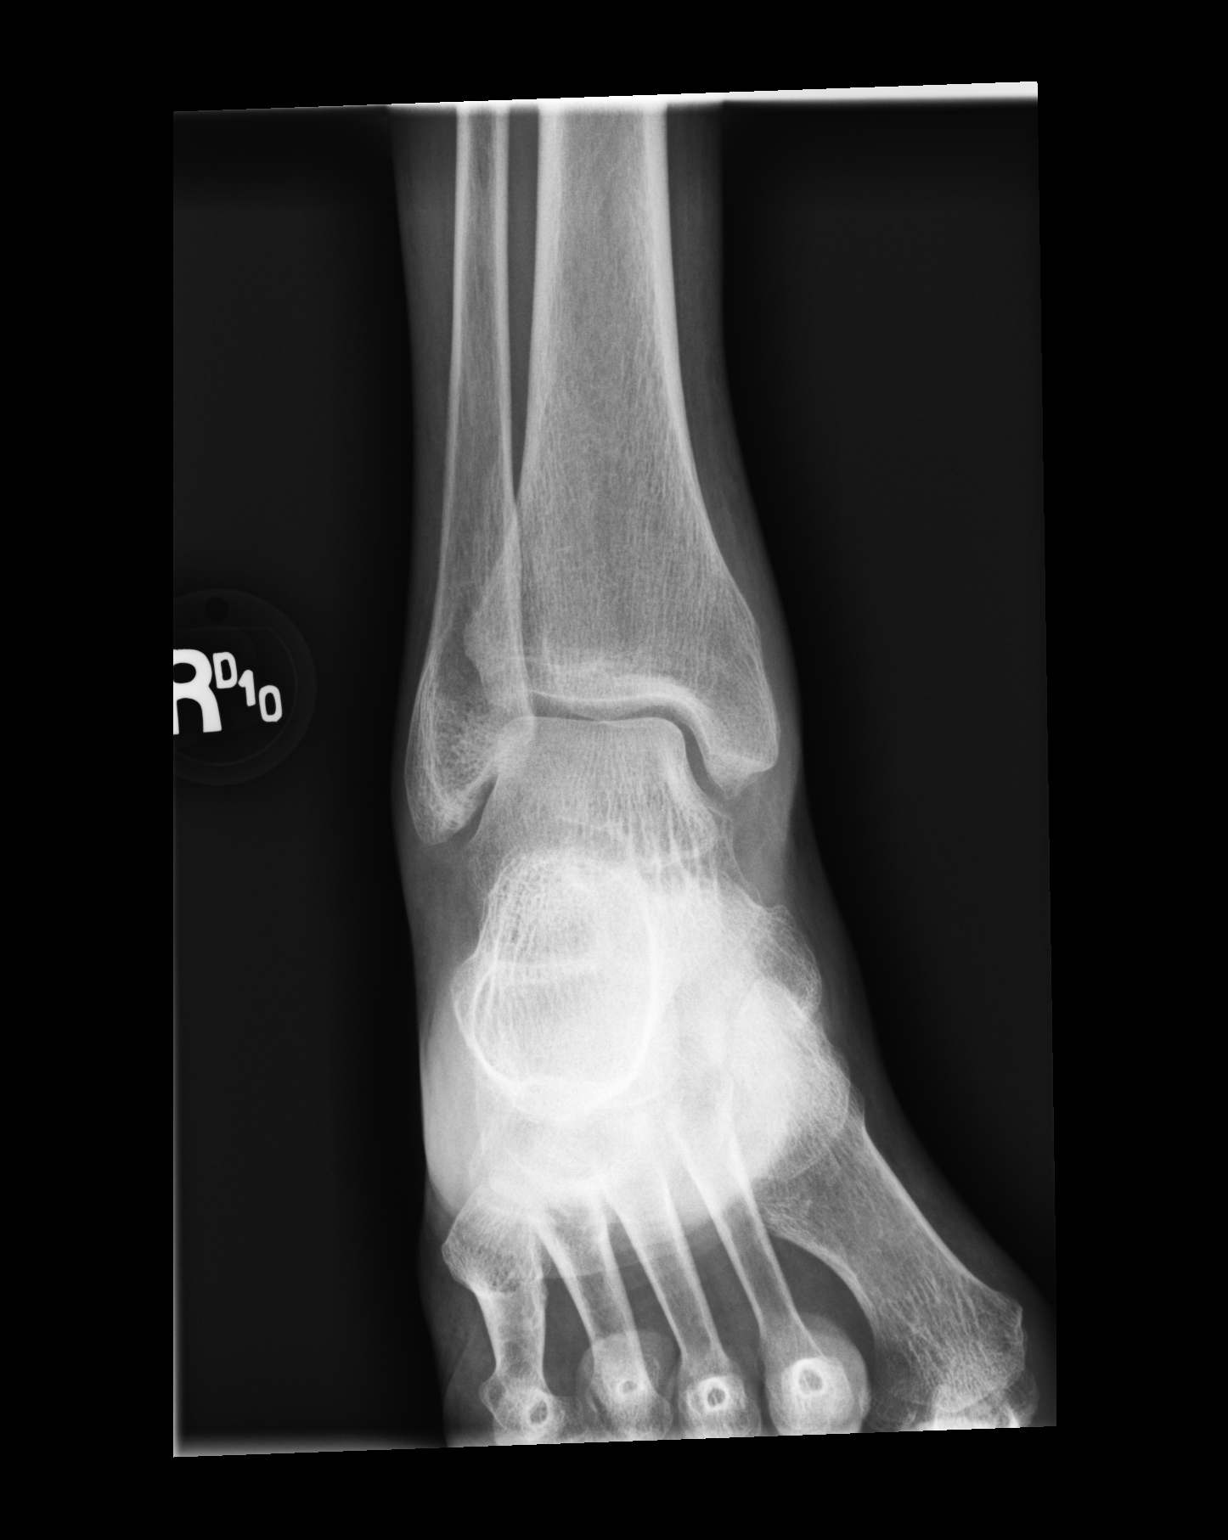

[dg ankle complete right (2 of 3)]
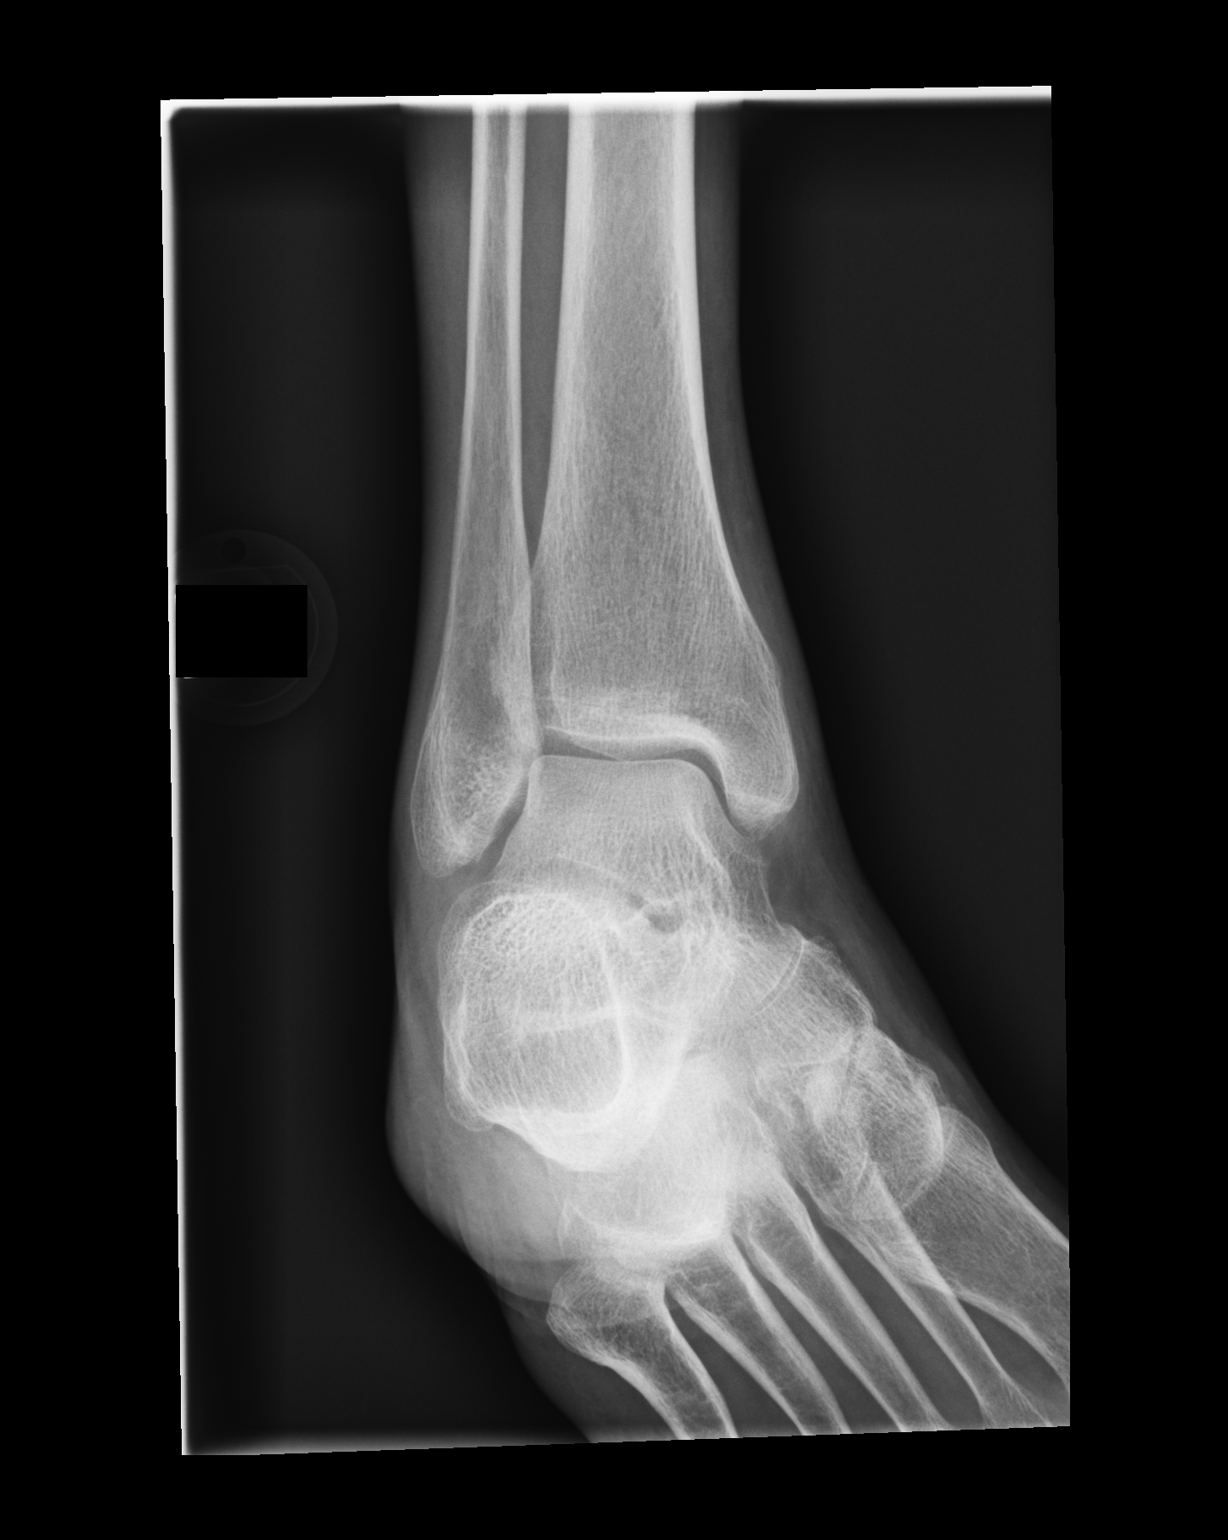

[dg ankle complete right (3 of 3)]
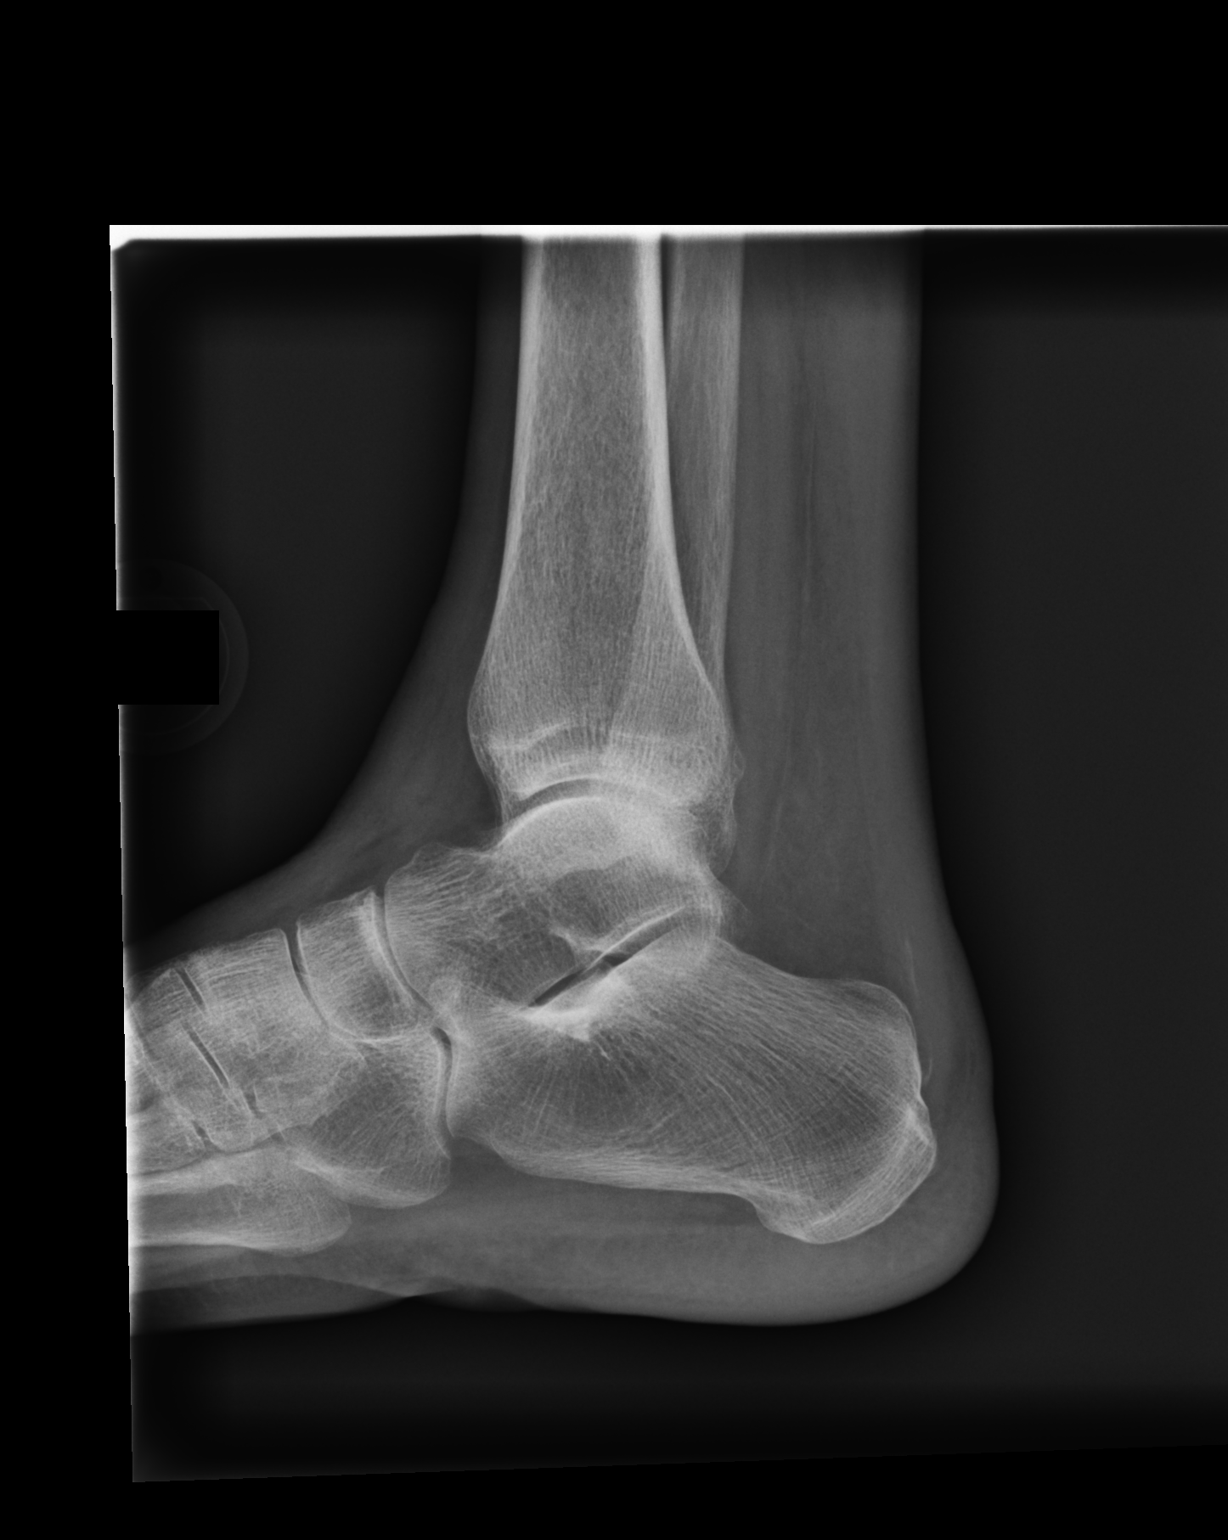

[3 of 3 positions shown; findings below may reference images not displayed]

FINDINGS: Right ankle:

Mild distal medial malleolar degenerative osteophytosis. The ankle
mortise is symmetric and intact. No acute fracture or dislocation.

Right foot:

Minimal hallux valgus. Minimal great toe metatarsophalangeal joint
space narrowing. Mild joint space narrowing of the interphalangeal
joints diffusely. No acute fracture or dislocation.
IMPRESSION: Minimal hallux valgus and great toe metatarsophalangeal joint
osteoarthritis.

Mild interphalangeal joint osteoarthritis diffusely.
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
## 2021-09-29 ENCOUNTER — Ambulatory Visit (INDEPENDENT_AMBULATORY_CARE_PROVIDER_SITE_OTHER): Payer: Medicare Other | Admitting: Internal Medicine

## 2021-09-29 ENCOUNTER — Encounter: Payer: Self-pay | Admitting: Internal Medicine

## 2021-09-29 VITALS — BP 124/68 | HR 63 | Ht 66.0 in | Wt 132.0 lb

## 2021-09-29 DIAGNOSIS — B182 Chronic viral hepatitis C: Secondary | ICD-10-CM

## 2021-09-29 DIAGNOSIS — Z720 Tobacco use: Secondary | ICD-10-CM

## 2021-09-29 DIAGNOSIS — E785 Hyperlipidemia, unspecified: Secondary | ICD-10-CM | POA: Diagnosis not present

## 2021-09-29 DIAGNOSIS — I700 Atherosclerosis of aorta: Secondary | ICD-10-CM | POA: Diagnosis not present

## 2021-09-29 DIAGNOSIS — I2510 Atherosclerotic heart disease of native coronary artery without angina pectoris: Secondary | ICD-10-CM

## 2021-09-29 MED ORDER — ATORVASTATIN CALCIUM 40 MG PO TABS: 40.0000 mg | ORAL_TABLET | Freq: Every day | ORAL | 3 refills | Status: DC

## 2021-09-29 MED ORDER — ASPIRIN EC 81 MG PO TBEC: 81.0000 mg | DELAYED_RELEASE_TABLET | Freq: Every day | ORAL | 3 refills | Status: DC

## 2021-09-29 NOTE — Patient Instructions (Signed)
Medication Instructions:  ?Your physician has recommended you make the following change in your medication:  ?1.) start aspirin 81 mg - one tablet daily ?2.) start atorvastatin 40 mg - one tablet daily ? ?*If you need a refill on your cardiac medications before your next appointment, please call your pharmacy* ? ? ?Lab Work: ?Please return in 2 months for Lipids/Liver/Lp a ? ?If you have labs (blood work) drawn today and your tests are completely normal, you will receive your results only by: ?MyChart Message (if you have MyChart) OR ?A paper copy in the mail ?If you have any lab test that is abnormal or we need to change your treatment, we will call you to review the results. ? ? ?Testing/Procedures: ?none ? ? ?Follow-Up: ?At [facility], you and your health needs are our priority.  As part of our continuing mission to provide you with exceptional heart care, we have created designated Provider Care Teams.  These Care Teams include your primary Cardiologist (physician) and Advanced Practice Providers (APPs -  Physician Assistants and Nurse Practitioners) who all work together to provide you with the care you need, when you need it. ? ?We recommend signing up for the patient portal called "MyChart".  Sign up information is provided on this After Visit Summary.  MyChart is used to connect with patients for Virtual Visits (Telemedicine).  Patients are able to view lab/test results, encounter notes, upcoming appointments, etc.  Non-urgent messages can be sent to your provider as well.   ?To learn more about what you can do with MyChart, go to [email or web address].   ? ?Your next appointment:   ?12 month(s) ? ?The format for your next appointment:   ?In Person ? ?Provider:   ?Orbie Pyo, MD   ? ? ?Other Instructions ? ? ?Important Information About Sugar ? ? ? ? ?  ?

## 2021-10-02 NOTE — Addendum Note (Signed)
Addended by: Dennis Bast F on: 10/02/2021 11:17 AM ? ? Modules accepted: Orders ? ?

## 2021-11-29 ENCOUNTER — Other Ambulatory Visit: Payer: Medicare PPO

## 2022-01-30 ENCOUNTER — Ambulatory Visit: Payer: Self-pay

## 2022-01-30 ENCOUNTER — Other Ambulatory Visit: Payer: Self-pay

## 2022-07-19 ENCOUNTER — Other Ambulatory Visit: Payer: Self-pay

## 2022-07-19 ENCOUNTER — Emergency Department (HOSPITAL_COMMUNITY)
Admission: EM | Admit: 2022-07-19 | Discharge: 2022-07-20 | Disposition: A | Discharge status: Home / Self Care | Payer: No Typology Code available for payment source | Attending: Emergency Medicine | Admitting: Emergency Medicine

## 2022-07-19 DIAGNOSIS — M549 Dorsalgia, unspecified: Secondary | ICD-10-CM | POA: Insufficient documentation

## 2022-07-19 DIAGNOSIS — M25552 Pain in left hip: Secondary | ICD-10-CM | POA: Diagnosis not present

## 2022-07-19 DIAGNOSIS — E876 Hypokalemia: Secondary | ICD-10-CM | POA: Insufficient documentation

## 2022-07-19 DIAGNOSIS — Z1152 Encounter for screening for COVID-19: Secondary | ICD-10-CM | POA: Diagnosis not present

## 2022-07-19 DIAGNOSIS — R509 Fever, unspecified: Secondary | ICD-10-CM | POA: Diagnosis not present

## 2022-07-19 DIAGNOSIS — M545 Low back pain, unspecified: Secondary | ICD-10-CM | POA: Diagnosis not present

## 2022-07-19 DIAGNOSIS — R059 Cough, unspecified: Secondary | ICD-10-CM | POA: Diagnosis not present

## 2022-07-19 DIAGNOSIS — R52 Pain, unspecified: Secondary | ICD-10-CM

## 2022-07-19 DIAGNOSIS — R079 Chest pain, unspecified: Secondary | ICD-10-CM | POA: Diagnosis not present

## 2022-07-19 LAB — URINALYSIS, ROUTINE W REFLEX MICROSCOPIC
Bacteria, UA: NONE SEEN
Bilirubin Urine: NEGATIVE
Glucose, UA: NEGATIVE mg/dL
Ketones, ur: NEGATIVE mg/dL
Leukocytes,Ua: NEGATIVE
Nitrite: NEGATIVE
Protein, ur: NEGATIVE mg/dL
Specific Gravity, Urine: 1.008 (ref 1.005–1.030)
pH: 5 (ref 5.0–8.0)

## 2022-07-19 NOTE — ED Triage Notes (Addendum)
Patient reports left lateral abdominal pain radiating to lower back onset this week , no emesis or diarrhea , she adds generalized body aches/fatigue and posterior neck pain  .

## 2022-07-20 ENCOUNTER — Emergency Department (HOSPITAL_COMMUNITY): Payer: No Typology Code available for payment source

## 2022-07-20 DIAGNOSIS — M25552 Pain in left hip: Secondary | ICD-10-CM | POA: Diagnosis not present

## 2022-07-20 DIAGNOSIS — R059 Cough, unspecified: Secondary | ICD-10-CM | POA: Diagnosis not present

## 2022-07-20 DIAGNOSIS — R079 Chest pain, unspecified: Secondary | ICD-10-CM | POA: Diagnosis not present

## 2022-07-20 LAB — RESP PANEL BY RT-PCR (RSV, FLU A&B, COVID)  RVPGX2
Influenza A by PCR: NEGATIVE
Influenza B by PCR: NEGATIVE
Resp Syncytial Virus by PCR: NEGATIVE
SARS Coronavirus 2 by RT PCR: NEGATIVE

## 2022-07-20 LAB — COMPREHENSIVE METABOLIC PANEL
ALT: 10 U/L (ref 0–44)
AST: 18 U/L (ref 15–41)
Albumin: 3.2 g/dL — ABNORMAL LOW (ref 3.5–5.0)
Alkaline Phosphatase: 55 U/L (ref 38–126)
Anion gap: 13 (ref 5–15)
BUN: 8 mg/dL (ref 8–23)
CO2: 23 mmol/L (ref 22–32)
Calcium: 9 mg/dL (ref 8.9–10.3)
Chloride: 99 mmol/L (ref 98–111)
Creatinine, Ser: 0.7 mg/dL (ref 0.44–1.00)
GFR, Estimated: >60 mL/min (ref >60)
Glucose, Bld: 99 mg/dL (ref 70–99)
Potassium: 3.3 mmol/L — ABNORMAL LOW (ref 3.5–5.1)
Sodium: 135 mmol/L (ref 135–145)
Total Bilirubin: 0.5 mg/dL (ref 0.3–1.2)
Total Protein: 7.5 g/dL (ref 6.5–8.1)

## 2022-07-20 LAB — CBC
HCT: 34.2 % — ABNORMAL LOW (ref 36.0–46.0)
Hemoglobin: 11.9 g/dL — ABNORMAL LOW (ref 12.0–15.0)
MCH: 29.9 pg (ref 26.0–34.0)
MCHC: 34.8 g/dL (ref 30.0–36.0)
MCV: 85.9 fL (ref 80.0–100.0)
Platelets: 241 10*3/uL (ref 150–400)
RBC: 3.98 MIL/uL (ref 3.87–5.11)
RDW: 12.2 % (ref 11.5–15.5)
WBC: 9.8 10*3/uL (ref 4.0–10.5)
nRBC: 0 % (ref 0.0–0.2)

## 2022-07-20 LAB — LIPASE, BLOOD: Lipase: 42 U/L (ref 11–51)

## 2022-07-20 MED ORDER — POTASSIUM CHLORIDE CRYS ER 20 MEQ PO TBCR
40.0000 meq | EXTENDED_RELEASE_TABLET | Freq: Once | ORAL | Status: AC
  Administered 2022-07-20: 40 meq via ORAL
  Filled 2022-07-20: qty 2

## 2022-07-20 MED ORDER — ACETAMINOPHEN 325 MG PO TABS
650.0000 mg | ORAL_TABLET | Freq: Four times a day (QID) | ORAL | Status: DC | PRN
  Administered 2022-07-20: 650 mg via ORAL
  Filled 2022-07-20: qty 2

## 2022-07-20 NOTE — ED Provider Notes (Signed)
[facility] Emergency Department Provider Note MRN:  [id number]  Arrival date & time: 07/20/22     Chief Complaint   Abdominal Pain and Back Pain (Gen. Body Aches)   History of Present Illness   Kristen Bautista is a 67 y.o. year-old female presents to the ED with chief complaint of generalized body aches for the past 3 days.  She reports some associated dry cough.  She denies fevers, chills, nausea, vomiting, or diarrhea.  She states that she has had some fatigue.  She expresses concern about COVID.  History provided by patient.   Review of Systems  Pertinent positive and negative review of systems noted in HPI.    Physical Exam   Vitals:   07/20/22 0445 07/20/22 0449  BP: 131/78   Pulse: 81   Resp: 17   Temp:  98.6 F (37 C)  SpO2: 98%     CONSTITUTIONAL:  non toxic-appearing, NAD NEURO:  Alert and oriented x 3, CN 3-12 grossly intact EYES:  eyes equal and reactive ENT/NECK:  Supple, no stridor  CARDIO:  normal rate on my exam, regular rhythm, appears well-perfused  PULM:  No respiratory distress, CTAB GI/GU:  non-distended, no focal abdominal tenderness MSK/SPINE:  No gross deformities, no edema, moves all extremities  SKIN:  no rash, atraumatic   *Additional and/or pertinent findings included in MDM below  Diagnostic and Interventional Summary    EKG Interpretation  Date/Time:    Ventricular Rate:    PR Interval:    QRS Duration:   QT Interval:    QTC Calculation:   R Axis:     Text Interpretation:         Labs Reviewed  COMPREHENSIVE METABOLIC PANEL - Abnormal; Notable for the following components:      Result Value   Potassium 3.3 (*)    Albumin 3.2 (*)    All other components within normal limits  CBC - Abnormal; Notable for the following components:   Hemoglobin 11.9 (*)    HCT 34.2 (*)    All other components within normal limits  URINALYSIS, ROUTINE W REFLEX MICROSCOPIC - Abnormal; Notable for the following  components:   Hgb urine dipstick SMALL (*)    All other components within normal limits  RESP PANEL BY RT-PCR (RSV, FLU A&B, COVID)  RVPGX2  LIPASE, BLOOD    DG Chest 2 View  Final Result    DG HIP UNILAT WITH PELVIS 2-3 VIEWS LEFT  Final Result      Medications  acetaminophen (TYLENOL) tablet 650 mg (650 mg Oral Given 07/20/22 0158)  potassium chloride SA (KLOR-CON M) CR tablet 40 mEq (has no administration in time range)     Procedures  /  Critical Care Procedures  ED Course and Medical Decision Making  I have reviewed the triage vital signs, the nursing notes, and pertinent available records from the EMR.  Social Determinants Affecting Complexity of Care: Patient has no clinically significant social determinants affecting this chief complaint..   ED Course:    Medical Decision Making Patient here with generalized body aches for the past few days.  She is noted to have mildly elevated temp of 99.5 in triage and tachycardia, but this resolved.  She is concerned about COVID and her description of her symptoms do sound like viral etiology, but COVID and flu and negative.  Labs are fairly unremarkable.  Notable for mild hypokalemia, which I've repleted in the ED.  Her CXR and left hip  x-ray don't show any acute pathology warranting further emergent workup.  He abdomen is benign on exam and I don't think we need to pursue any advance imaging tonight.  She is give strict return precautions.  She has follow-up with her PCP on Monday.  She is advised to return if symptoms change or worsen.  Amount and/or Complexity of Data Reviewed Labs: ordered. Radiology: ordered.  Risk OTC drugs. Prescription drug management.     Consultants: No consultations were needed in caring for this patient.   Treatment and Plan: Emergency department workup does not suggest an emergent condition requiring admission or immediate intervention beyond  what has been performed at this time. The patient  is safe for discharge and has  been instructed to return immediately for worsening symptoms, change in  symptoms or any other concerns    Final Clinical Impressions(s) / ED Diagnoses     ICD-10-CM   1. Pain of left hip  M25.552     2. Back pain, unspecified back location, unspecified back pain laterality, unspecified chronicity  M54.9     3. Generalized body aches  R52       ED Discharge Orders     None         Discharge Instructions Discussed with and Provided to Patient:     Discharge Instructions      No certain cause of your symptoms was found tonight.  Please follow-up with your doctor.  If your symptoms change or worsen, please return to the ER.       Asma, [place], PA-C 07/20/22 [id number]    Maudie Flakes, MD 07/20/22 [phone]

## 2022-07-20 NOTE — ED Notes (Signed)
Patient transported to X-ray in NAD.

## 2022-07-20 NOTE — Discharge Instructions (Signed)
No certain cause of your symptoms was found tonight.  Please follow-up with your doctor.  If your symptoms change or worsen, please return to the ER.

## 2022-07-21 ENCOUNTER — Ambulatory Visit
Admission: EM | Admit: 2022-07-21 | Discharge: 2022-07-21 | Disposition: A | Discharge status: Home / Self Care | Payer: No Typology Code available for payment source | Attending: Nurse Practitioner | Admitting: Nurse Practitioner

## 2022-07-21 DIAGNOSIS — M545 Low back pain, unspecified: Secondary | ICD-10-CM

## 2022-07-21 DIAGNOSIS — M542 Cervicalgia: Secondary | ICD-10-CM | POA: Diagnosis not present

## 2022-07-21 MED ORDER — NAPROXEN 375 MG PO TABS: 375.0000 mg | ORAL_TABLET | Freq: Two times a day (BID) | ORAL | 0 refills | Status: DC

## 2022-07-21 MED ORDER — CYCLOBENZAPRINE HCL 5 MG PO TABS: 5.0000 mg | ORAL_TABLET | Freq: Every evening | ORAL | 0 refills | Status: AC | PRN

## 2022-07-21 NOTE — ED Triage Notes (Signed)
Pt reports having sharp pain that starts in her head (headache) that radiates down her body to her legs. Started: about a week ago

## 2022-07-21 NOTE — ED Provider Notes (Signed)
[facility]    CSN: [id number] Arrival date & time: 07/21/22  0840      History   Chief Complaint Chief Complaint  Patient presents with   Back Pain   Headache    HPI Kristen Bautista is a 67 y.o. female presents for evaluation of 1 week of intermittent neck and back pain.  Denies any known injury or inciting event.  Reports sharp pains in her neck with certain movements as well as low back pain when she moves her right leg only.  Denies any numbness/tingling/weakness of her upper or lower extremities, no bowel or bladder incontinence, no saddle paresthesia.  Denies history of neck  or back injuries or surgeries.  She was in the ER 2 days ago with complaints of back pain generalized body aches and left hip pain.  She had a negative UA, negative respiratory panel, negative chest x-ray, negative left hip x-ray.  She does have some potassium replaced and was advised to follow-up with her PCP.  She does have an appointment in 2 days with her PCP.  She has not tried any over-the-counter medications for her symptoms.  No other concerns at this time.   Back Pain Headache Associated symptoms: back pain and neck pain     History reviewed. No pertinent past medical history.  There are no problems to display for this patient.   Past Surgical History:  Procedure Laterality Date   ABDOMINAL HERNIA REPAIR      OB History     Gravida  0   Para  0   Term  0   Preterm  0   AB  0   Living         SAB  0   IAB  0   Ectopic  0   Multiple      Live Births               Home Medications    Prior to Admission medications   Medication Sig Start Date End Date Taking? Authorizing Provider  cyclobenzaprine (FLEXERIL) 5 MG tablet Take 1 tablet (5 mg total) by mouth at bedtime as needed for up to 3 days for muscle spasms. 07/21/22 07/24/22 Yes Melynda Ripple, NP  naproxen (NAPROSYN) 375 MG tablet Take 1 tablet (375 mg total) by mouth 2 (two) times daily for 5 days.  07/21/22 07/26/22 Yes Melynda Ripple, NP  aspirin EC 81 MG tablet Take 1 tablet (81 mg total) by mouth daily. Swallow whole. 09/29/21   Early Osmond, MD  atorvastatin (LIPITOR) 40 MG tablet Take 1 tablet (40 mg total) by mouth daily. 09/29/21   Early Osmond, MD    Family History Family History  Problem Relation Age of Onset   High blood pressure Mother     Social History Social History   Tobacco Use   Smoking status: Every Day    Types: Cigarettes    Passive exposure: Past   Smokeless tobacco: Never  Vaping Use   Vaping Use: Never used  Substance Use Topics   Alcohol use: Not Currently    Alcohol/week: 42.0 standard drinks of alcohol    Types: 42 Cans of beer per week   Drug use: No     Allergies   Patient has no known allergies.   Review of Systems Review of Systems  Musculoskeletal:  Positive for back pain and neck pain.     Physical Exam Triage Vital Signs ED Triage Vitals  Enc Vitals Group     BP 07/21/22 0857 128/61     Pulse Rate 07/21/22 0857 (!) 102     Resp 07/21/22 0857 16     Temp 07/21/22 0857 98.2 F (36.8 C)     Temp Source 07/21/22 0857 Oral     SpO2 07/21/22 0857 95 %     Weight --      Height --      Head Circumference --      Peak Flow --      Pain Score 07/21/22 0855 10     Pain Loc --      Pain Edu? --      Excl. in [place]? --    No data found.  Updated Vital Signs BP 128/61 (BP Location: Left Arm)   Pulse (!) 102   Temp 98.2 F (36.8 C) (Oral)   Resp 16   SpO2 95%   Visual Acuity Right Eye Distance:   Left Eye Distance:   Bilateral Distance:    Right Eye Near:   Left Eye Near:    Bilateral Near:     Physical Exam Vitals and nursing note reviewed.  Constitutional:      Appearance: Normal appearance.  HENT:     Head: Normocephalic and atraumatic.  Eyes:     Pupils: Pupils are equal, round, and reactive to light.  Neck:     Comments: Drinks 5 out of 5 bilateral upper extremities.  There is bilateral cervical  muscle tenderness with palpation Cardiovascular:     Rate and Rhythm: Normal rate.  Pulmonary:     Effort: Pulmonary effort is normal.  Musculoskeletal:     Cervical back: Normal, normal range of motion and neck supple. No edema, erythema, signs of trauma, rigidity, torticollis or crepitus. Muscular tenderness present. No pain with movement or spinous process tenderness. Normal range of motion.     Thoracic back: Normal.     Lumbar back: Tenderness present. No swelling, edema, deformity, signs of trauma, lacerations, spasms or bony tenderness. Normal range of motion. Negative right straight leg raise test and negative left straight leg raise test. No scoliosis.     Comments: Mild tenderness to palpation to bilateral her paraspinal final lumbar muscles.  Strength 5 out of 5 bilateral lower extremities  Skin:    General: Skin is warm and dry.  Neurological:     General: No focal deficit present.     Mental Status: She is alert and oriented to person, place, and time.  Psychiatric:        Mood and Affect: Mood normal.        Behavior: Behavior normal.      UC Treatments / Results  Labs (all labs ordered are listed, but only abnormal results are displayed) Labs Reviewed - No data to display  EKG   Radiology DG Chest 2 View  Result Date: 07/20/2022 CLINICAL DATA:  Coughing and left hip pain. EXAM: DG HIP (WITH OR WITHOUT PELVIS) 2-3V LEFT; CHEST - 2 VIEW COMPARISON:  PA and lateral chest 06/20/2015. No prior studies to compare to the AP pelvis and left hip films. FINDINGS: PA and lateral chest: The cardiomediastinal silhouette and vascular pattern are normal. There is calcification of the transverse aorta. The lungs are mildly hyperexpanded but clear.  The sulci are sharp. There is mild osteopenia, slight upper thoracic levoscoliosis. No acute thoracic compression fracture. There are overlying monitor wires. AP pelvis and separate AP and frog-leg left hip views: Mild  osteopenia. There is  no evidence of fractures of the AP pelvis and proximal femurs. 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 Mild osteitis pubis. No focal bone lesion is seen.  There are left pelvic phleboliths. IMPRESSION: 1. No evidence of acute chest disease.  Hyperinflated chest. 2. Mild osteopenia and degenerative change without evidence of pelvic or left hip fractures. 3. Aortic atherosclerosis. Electronically Signed   By: Telford Nab M.D.   On: 07/20/2022 02:03   DG HIP UNILAT WITH PELVIS 2-3 VIEWS LEFT  Result Date: 07/20/2022 CLINICAL DATA:  Coughing and left hip pain. EXAM: DG HIP (WITH OR WITHOUT PELVIS) 2-3V LEFT; CHEST - 2 VIEW COMPARISON:  PA and lateral chest 06/20/2015. No prior studies to compare to the AP pelvis and left hip films. FINDINGS: PA and lateral chest: The cardiomediastinal silhouette and vascular pattern are normal. There is calcification of the transverse aorta. The lungs are mildly hyperexpanded but clear.  The sulci are sharp. There is mild osteopenia, slight upper thoracic levoscoliosis. No acute thoracic compression fracture. There are overlying monitor wires. AP pelvis and separate AP and frog-leg left hip views: Mild osteopenia. There is no evidence of fractures of the AP pelvis and proximal femurs. 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 Mild osteitis pubis. No focal bone lesion is seen.  There are left pelvic phleboliths. IMPRESSION: 1. No evidence of acute chest disease.  Hyperinflated chest. 2. Mild osteopenia and degenerative change without evidence of pelvic or left hip fractures. 3. Aortic atherosclerosis. Electronically Signed   By: Telford Nab M.D.   On: 07/20/2022 02:03    Procedures Procedures (including critical care time)  Medications Ordered in UC Medications - No data to display  Initial Impression / Assessment and Plan / UC Course  I have  reviewed the triage vital signs and the nursing notes.  Pertinent labs & imaging results that were available during my care of the patient were reviewed by me and considered in my medical decision making (see chart for details).  Clinical Course as of 07/21/22 0919  Sat Jul 21, 2022  0919 HR recheck 94 [JM]    Clinical Course User Index [JM] Melynda Ripple, NP    Reviewed exam and symptoms with patient.  No red flags on exam. Trial of naproxen twice daily for 5 days.  Patient to take with food Flexeril as needed nightly for 3 days.  Side effect profile reviewed and she verbalized understanding.  States she has been on this before without issue Heat to the affected areas She is to follow-up with her PCP at her scheduled appointment in 2 days for further treatment options and evaluation She is to go to the emergency room for any worsening symptoms that occur prior to her seeing her PCP and she verbalized understanding Final Clinical Impressions(s) / UC Diagnoses   Final diagnoses:  Neck pain without injury  Acute low back pain, unspecified back pain laterality, unspecified whether sciatica present     Discharge Instructions      Naproxen twice daily for 5 days.  Please take this with food. Flexeril at night as needed.  Please note this medication can make you drowsy.  Do not drink alcohol or drive while on this medication Heat to your neck and back as needed Follow-up with your PCP at your scheduled appointment on Monday Please go to the emergency room for any worsening symptoms  that occur prior to seeing your primary on Monday   ED Prescriptions     Medication Sig Dispense Auth. Provider   naproxen (NAPROSYN) 375 MG tablet Take 1 tablet (375 mg total) by mouth 2 (two) times daily for 5 days. 10 tablet Melynda Ripple, NP   cyclobenzaprine (FLEXERIL) 5 MG tablet Take 1 tablet (5 mg total) by mouth at bedtime as needed for up to 3 days for muscle spasms. 3 tablet Melynda Ripple, NP       PDMP not reviewed this encounter.   Melynda Ripple, NP 07/21/22 [id number]

## 2022-07-21 NOTE — Discharge Instructions (Signed)
Naproxen twice daily for 5 days.  Please take this with food. Flexeril at night as needed.  Please note this medication can make you drowsy.  Do not drink alcohol or drive while on this medication Heat to your neck and back as needed Follow-up with your PCP at your scheduled appointment on Monday Please go to the emergency room for any worsening symptoms that occur prior to seeing your primary on Monday

## 2022-07-23 DIAGNOSIS — B182 Chronic viral hepatitis C: Secondary | ICD-10-CM | POA: Diagnosis not present

## 2022-07-23 DIAGNOSIS — F1099 Alcohol use, unspecified with unspecified alcohol-induced disorder: Secondary | ICD-10-CM | POA: Diagnosis not present

## 2022-07-23 DIAGNOSIS — M549 Dorsalgia, unspecified: Secondary | ICD-10-CM | POA: Diagnosis not present

## 2022-07-23 DIAGNOSIS — M25519 Pain in unspecified shoulder: Secondary | ICD-10-CM | POA: Diagnosis not present

## 2022-07-23 DIAGNOSIS — K7460 Unspecified cirrhosis of liver: Secondary | ICD-10-CM | POA: Diagnosis not present

## 2022-07-23 DIAGNOSIS — F17200 Nicotine dependence, unspecified, uncomplicated: Secondary | ICD-10-CM | POA: Diagnosis not present

## 2022-07-23 DIAGNOSIS — E876 Hypokalemia: Secondary | ICD-10-CM | POA: Diagnosis not present

## 2022-07-26 ENCOUNTER — Encounter (HOSPITAL_COMMUNITY): Payer: Self-pay | Admitting: Internal Medicine

## 2022-07-26 ENCOUNTER — Emergency Department (HOSPITAL_COMMUNITY): Payer: No Typology Code available for payment source

## 2022-07-26 ENCOUNTER — Inpatient Hospital Stay (HOSPITAL_COMMUNITY)
Admission: EM | Admit: 2022-07-26 | Discharge: 2022-08-01 | DRG: 471 | Disposition: A | Discharge status: Rehabilitation Facility | Payer: No Typology Code available for payment source | Attending: Internal Medicine | Admitting: Internal Medicine

## 2022-07-26 DIAGNOSIS — I700 Atherosclerosis of aorta: Secondary | ICD-10-CM | POA: Diagnosis not present

## 2022-07-26 DIAGNOSIS — Z66 Do not resuscitate: Secondary | ICD-10-CM | POA: Insufficient documentation

## 2022-07-26 DIAGNOSIS — E440 Moderate protein-calorie malnutrition: Secondary | ICD-10-CM | POA: Diagnosis not present

## 2022-07-26 DIAGNOSIS — Z8619 Personal history of other infectious and parasitic diseases: Secondary | ICD-10-CM | POA: Diagnosis not present

## 2022-07-26 DIAGNOSIS — F17200 Nicotine dependence, unspecified, uncomplicated: Secondary | ICD-10-CM | POA: Diagnosis not present

## 2022-07-26 DIAGNOSIS — M4712 Other spondylosis with myelopathy, cervical region: Secondary | ICD-10-CM | POA: Diagnosis not present

## 2022-07-26 DIAGNOSIS — Z79899 Other long term (current) drug therapy: Secondary | ICD-10-CM | POA: Diagnosis not present

## 2022-07-26 DIAGNOSIS — D75839 Thrombocytosis, unspecified: Secondary | ICD-10-CM | POA: Diagnosis not present

## 2022-07-26 DIAGNOSIS — F1910 Other psychoactive substance abuse, uncomplicated: Secondary | ICD-10-CM | POA: Diagnosis not present

## 2022-07-26 DIAGNOSIS — N179 Acute kidney failure, unspecified: Secondary | ICD-10-CM | POA: Diagnosis not present

## 2022-07-26 DIAGNOSIS — K5900 Constipation, unspecified: Secondary | ICD-10-CM | POA: Diagnosis not present

## 2022-07-26 DIAGNOSIS — E785 Hyperlipidemia, unspecified: Secondary | ICD-10-CM | POA: Diagnosis not present

## 2022-07-26 DIAGNOSIS — Z6820 Body mass index (BMI) 20.0-20.9, adult: Secondary | ICD-10-CM | POA: Diagnosis not present

## 2022-07-26 DIAGNOSIS — G992 Myelopathy in diseases classified elsewhere: Secondary | ICD-10-CM | POA: Diagnosis not present

## 2022-07-26 DIAGNOSIS — R3915 Urgency of urination: Secondary | ICD-10-CM | POA: Diagnosis not present

## 2022-07-26 DIAGNOSIS — G062 Extradural and subdural abscess, unspecified: Secondary | ICD-10-CM

## 2022-07-26 DIAGNOSIS — K592 Neurogenic bowel, not elsewhere classified: Secondary | ICD-10-CM | POA: Diagnosis not present

## 2022-07-26 DIAGNOSIS — G061 Intraspinal abscess and granuloma: Secondary | ICD-10-CM | POA: Diagnosis not present

## 2022-07-26 DIAGNOSIS — R32 Unspecified urinary incontinence: Secondary | ICD-10-CM | POA: Diagnosis not present

## 2022-07-26 DIAGNOSIS — M4802 Spinal stenosis, cervical region: Secondary | ICD-10-CM

## 2022-07-26 DIAGNOSIS — K5901 Slow transit constipation: Secondary | ICD-10-CM | POA: Diagnosis not present

## 2022-07-26 DIAGNOSIS — Z87898 Personal history of other specified conditions: Secondary | ICD-10-CM | POA: Diagnosis not present

## 2022-07-26 DIAGNOSIS — R531 Weakness: Secondary | ICD-10-CM | POA: Diagnosis not present

## 2022-07-26 DIAGNOSIS — Z7982 Long term (current) use of aspirin: Secondary | ICD-10-CM

## 2022-07-26 DIAGNOSIS — R202 Paresthesia of skin: Secondary | ICD-10-CM | POA: Diagnosis not present

## 2022-07-26 DIAGNOSIS — I10 Essential (primary) hypertension: Secondary | ICD-10-CM | POA: Diagnosis not present

## 2022-07-26 DIAGNOSIS — F17210 Nicotine dependence, cigarettes, uncomplicated: Secondary | ICD-10-CM | POA: Diagnosis present

## 2022-07-26 DIAGNOSIS — B952 Enterococcus as the cause of diseases classified elsewhere: Secondary | ICD-10-CM | POA: Diagnosis not present

## 2022-07-26 DIAGNOSIS — Z981 Arthrodesis status: Secondary | ICD-10-CM | POA: Diagnosis not present

## 2022-07-26 DIAGNOSIS — D649 Anemia, unspecified: Secondary | ICD-10-CM | POA: Diagnosis not present

## 2022-07-26 DIAGNOSIS — Z8661 Personal history of infections of the central nervous system: Secondary | ICD-10-CM | POA: Diagnosis not present

## 2022-07-26 DIAGNOSIS — R5381 Other malaise: Secondary | ICD-10-CM | POA: Diagnosis not present

## 2022-07-26 DIAGNOSIS — M5010 Cervical disc disorder with radiculopathy, unspecified cervical region: Secondary | ICD-10-CM | POA: Diagnosis not present

## 2022-07-26 DIAGNOSIS — M792 Neuralgia and neuritis, unspecified: Secondary | ICD-10-CM | POA: Diagnosis not present

## 2022-07-26 DIAGNOSIS — Z1621 Resistance to vancomycin: Secondary | ICD-10-CM | POA: Diagnosis not present

## 2022-07-26 DIAGNOSIS — R200 Anesthesia of skin: Secondary | ICD-10-CM | POA: Diagnosis not present

## 2022-07-26 DIAGNOSIS — G959 Disease of spinal cord, unspecified: Secondary | ICD-10-CM | POA: Diagnosis present

## 2022-07-26 DIAGNOSIS — Z681 Body mass index (BMI) 19 or less, adult: Secondary | ICD-10-CM | POA: Diagnosis not present

## 2022-07-26 DIAGNOSIS — Z4789 Encounter for other orthopedic aftercare: Secondary | ICD-10-CM | POA: Diagnosis not present

## 2022-07-26 DIAGNOSIS — Z8249 Family history of ischemic heart disease and other diseases of the circulatory system: Secondary | ICD-10-CM

## 2022-07-26 DIAGNOSIS — J432 Centrilobular emphysema: Secondary | ICD-10-CM | POA: Diagnosis not present

## 2022-07-26 DIAGNOSIS — F1990 Other psychoactive substance use, unspecified, uncomplicated: Secondary | ICD-10-CM | POA: Diagnosis not present

## 2022-07-26 DIAGNOSIS — D62 Acute posthemorrhagic anemia: Secondary | ICD-10-CM | POA: Diagnosis not present

## 2022-07-26 DIAGNOSIS — M7989 Other specified soft tissue disorders: Secondary | ICD-10-CM | POA: Diagnosis not present

## 2022-07-26 HISTORY — DX: Alcohol abuse, uncomplicated: F10.10

## 2022-07-26 HISTORY — DX: Atherosclerosis of aorta: I70.0

## 2022-07-26 HISTORY — DX: Disease of spinal cord, unspecified: G95.9

## 2022-07-26 HISTORY — DX: Unspecified viral hepatitis C without hepatic coma: B19.20

## 2022-07-26 HISTORY — DX: Tobacco use: Z72.0

## 2022-07-26 HISTORY — DX: Hyperlipidemia, unspecified: E78.5

## 2022-07-26 HISTORY — DX: Essential (primary) hypertension: I10

## 2022-07-26 HISTORY — DX: Nicotine dependence, unspecified, uncomplicated: F17.200

## 2022-07-26 HISTORY — DX: Centrilobular emphysema: J43.2

## 2022-07-26 HISTORY — DX: Cannabis abuse, uncomplicated: F12.10

## 2022-07-26 HISTORY — DX: Anemia, unspecified: D64.9

## 2022-07-26 LAB — COMPREHENSIVE METABOLIC PANEL
ALT: 13 U/L (ref 0–44)
AST: 25 U/L (ref 15–41)
Albumin: 3.1 g/dL — ABNORMAL LOW (ref 3.5–5.0)
Alkaline Phosphatase: 62 U/L (ref 38–126)
Anion gap: 11 (ref 5–15)
BUN: 13 mg/dL (ref 8–23)
CO2: 24 mmol/L (ref 22–32)
Calcium: 9.6 mg/dL (ref 8.9–10.3)
Chloride: 104 mmol/L (ref 98–111)
Creatinine, Ser: 0.84 mg/dL (ref 0.44–1.00)
GFR, Estimated: >60 mL/min (ref >60)
Glucose, Bld: 86 mg/dL (ref 70–99)
Potassium: 3.9 mmol/L (ref 3.5–5.1)
Sodium: 139 mmol/L (ref 135–145)
Total Bilirubin: 0.4 mg/dL (ref 0.3–1.2)
Total Protein: 7.9 g/dL (ref 6.5–8.1)

## 2022-07-26 LAB — CK: Total CK: 42 U/L (ref 38–234)

## 2022-07-26 LAB — ETHANOL: Alcohol, Ethyl (B): <10 mg/dL (ref <10)

## 2022-07-26 LAB — AMMONIA: Ammonia: 23 umol/L (ref 9–35)

## 2022-07-26 LAB — PROTIME-INR
INR: 1.1 (ref 0.8–1.2)
Prothrombin Time: 13.9 seconds (ref 11.4–15.2)

## 2022-07-26 LAB — MAGNESIUM: Magnesium: 2 mg/dL (ref 1.7–2.4)

## 2022-07-26 MED ORDER — GADOBUTROL 1 MMOL/ML IV SOLN
6.0000 mL | Freq: Once | INTRAVENOUS | Status: AC | PRN
  Administered 2022-07-26: 6 mL via INTRAVENOUS

## 2022-07-26 NOTE — Assessment & Plan Note (Signed)
Verified with patient her wishes for DNR/DNI status. Witnessed by her cousin Winn Jock at bedside.
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
## 2022-07-26 NOTE — ED Provider Notes (Signed)
[facility] Provider Note   CSN: [id number] Arrival date & time: 07/26/22  1653     History  Chief Complaint  Patient presents with   Weakness    Kristen Bautista is a 67 y.o. female.  Kristen Bautista is a 67 y.o. female with a history of cervical radiculopathy, cirrhosis and alcohol use, who presents to the ED via EMS for generalized weakness.  Patient reports over the past 2 days she started to feel progressively weaker.  She reports weakness feels generalized and all of her extremities feel heavy.  She reports she is able to lift her arms but feels like there is a 20 pound weight attached to them and she has difficulty lifting her legs.  Reports this takes a lot of effort for her and she cannot hold them up against resistance.  She had to call her family member to help her because she could not get up from the toilet today.  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  She reports prior to this weakness she was having some neck and back pain.  Was seen in the ED and had negative urinalysis and back and hip x-rays.  No imaging of the cervical spine done.  She was then seen at urgent care and treated with NSAIDs and Flexeril which seemed to improve her pain.  Today she does not complain of pain more so just generalized weakness.  She reports that she has some tingling in her hands and feet.  No associated headache, visual change, speech changes, dizziness or syncope.  No chest pain, shortness of breath.  No nausea or vomiting.  Patient reports she typically drinks about a sixpack of beer per day and did have half a beer prior to arrival today.  She denies any fevers or chills.  The history is provided by the patient and a relative.  Weakness Associated symptoms: no abdominal pain, no arthralgias, no chest pain, no cough, no dizziness, no dysuria, no fever, no myalgias, no nausea, no  shortness of breath and no vomiting        Home Medications Prior to Admission medications   Medication Sig Start Date End Date Taking? Authorizing Provider  aspirin EC 81 MG tablet Take 1 tablet (81 mg total) by mouth daily. Swallow whole. 09/29/21   Early Osmond, MD  atorvastatin (LIPITOR) 40 MG tablet Take 1 tablet (40 mg total) by mouth daily. 09/29/21   Early Osmond, MD  naproxen (NAPROSYN) 375 MG tablet Take 1 tablet (375 mg total) by mouth 2 (two) times daily for 5 days. 07/21/22 07/26/22  Melynda Ripple, NP      Allergies    Patient has no known allergies.    Review of Systems   Review of Systems  Constitutional:  Negative for chills and fever.  Respiratory:  Negative for cough and shortness of breath.   Cardiovascular:  Negative for chest pain.  Gastrointestinal:  Negative for abdominal pain, nausea and vomiting.  Genitourinary:  Negative for dysuria.  Musculoskeletal:  Positive for back pain and neck pain. Negative for arthralgias and myalgias.  Neurological:  Positive for weakness and numbness. Negative for dizziness, syncope and light-headedness.    Physical Exam Updated Vital Signs BP (!) 157/85 (BP Location: Left Arm)   Pulse 96   Temp 98.5 F (36.9 C) (Oral)   Resp 18  Ht 5' 7"$  (1.702 m)   Wt 59 kg   SpO2 98%   BMI 20.36 kg/m  Physical Exam Vitals and nursing note reviewed.  Constitutional:      General: She is not in acute distress.    Appearance: Normal appearance. She is well-developed and normal weight. She is not ill-appearing or diaphoretic.  HENT:     Head: Normocephalic and atraumatic.     Mouth/Throat:     Mouth: Mucous membranes are moist.     Pharynx: Oropharynx is clear.  Eyes:     General:        Right eye: No discharge.        Left eye: No discharge.  Cardiovascular:     Rate and Rhythm: Normal rate and regular rhythm.     Pulses: Normal pulses.     Heart sounds: Normal heart sounds.  Pulmonary:     Effort: Pulmonary effort is  normal. No respiratory distress.     Breath sounds: Normal breath sounds. No wheezing or rales.     Comments: Respirations equal and unlabored, patient able to speak in full sentences, lungs clear to auscultation bilaterally  Abdominal:     General: Bowel sounds are normal. There is no distension.     Palpations: Abdomen is soft. There is no mass.     Tenderness: There is no abdominal tenderness. There is no guarding.     Comments: Abdomen soft, nondistended, nontender to palpation in all quadrants without guarding or peritoneal signs  Musculoskeletal:        General: No swelling, tenderness or deformity.     Cervical back: Neck supple. No tenderness.  Skin:    General: Skin is warm and dry.     Capillary Refill: Capillary refill takes less than 2 seconds.  Neurological:     Mental Status: She is alert and oriented to person, place, and time.     Coordination: Coordination normal.     Comments: Speech is clear, able to follow commands CN III-XII intact  5/5 strength in bilateral upper extremities but patient reports sensation of heaviness.  4/5 strength in bilateral lower extremities.  Sensation normal to light and sharp touch 2+ DTRs in bilateral upper and lower extremities Moves extremities without ataxia, coordination intact  Psychiatric:        Mood and Affect: Mood normal.        Behavior: Behavior normal.     ED Results / Procedures / Treatments   Labs (all labs ordered are listed, but only abnormal results are displayed) Labs Reviewed  COMPREHENSIVE METABOLIC PANEL - Abnormal; Notable for the following components:      Result Value   Albumin 3.1 (*)    All other components within normal limits  PROTIME-INR  MAGNESIUM  AMMONIA  CK  CBC WITH DIFFERENTIAL/PLATELET  VITAMIN B12  ETHANOL    EKG None  Radiology No results found.  Procedures Procedures    Medications Ordered in ED Medications - No data to display  ED Course/ Medical Decision Making/ A&P                              Medical Decision Making  67 year old female presents with generalized weakness which appears to be a bit worse in the lower extremities than the upper extremities on exam.  No sensory deficits and reflexes intact.  History of prior cervical radiculopathy and underlying history of cirrhosis and alcohol use.  Differential includes metabolic derangement such as electrolyte derangement, vitamin B12 deficiency, hyperammonemia, versus cervical radiculopathy or myelopathy.  Given upper and lower extremity involvement low suspicion for lumbar issue and given bilateral symptoms unlikely for intracranial pathology.  I discussed case with Dr. Leonel Ramsay with neurology and he agrees with this differential, recommends MRI of the cervical spine but does not feel patient needs MRI brain given bilateral symptomology, especially given underlying history of prior cervical radiculopathy.  Lab evaluation including CBC, CMP, ammonia, magnesium, B12, and CK ordered.  MRI of the cervical spine ordered as well.  At shift change labs and imaging are pending.  Care signed out to Dr. Maylon Peppers if imaging and labs are unremarkable patient likely appropriate for outpatient follow-up if she is able to ambulate.        Final Clinical Impression(s) / ED Diagnoses Final diagnoses:  Weakness    Rx / DC Orders ED Discharge Orders     None         Jacqlyn Larsen, PA-C 07/26/22 [place], [place] K, DO 07/26/22 2229

## 2022-07-26 NOTE — Progress Notes (Signed)
  X-cover Note: MRI C-spine with potential for epidural abscess. Will send blood cultures and start zosyn and vanco. Discussed with patient.  Kristopher Oppenheim, DO [facility]

## 2022-07-26 NOTE — ED Triage Notes (Addendum)
Patient BIB EMS from home. EMS states family called due to weakness. EMS states family said that patient was dead weight and unable to move today, which is when they called. Patient was unable to stand and stated that she felt really heavy. Patient can lift both arms. Patient states her fingers feel cold and tingly. Patient can lift both legs after telling herself to lift them. EMS states patient normally has no problems with ADLs. Patient is A&Ox4. Patient states this started 2-3 days ago.

## 2022-07-26 NOTE — ED Notes (Signed)
Patient transported to MRI 

## 2022-07-26 NOTE — Assessment & Plan Note (Signed)
Admit to med/surg bed. Neurosurgery Dr. Reatha Armour consulted by EDP. NPO after MN.

## 2022-07-27 DIAGNOSIS — G959 Disease of spinal cord, unspecified: Secondary | ICD-10-CM | POA: Diagnosis not present

## 2022-07-27 DIAGNOSIS — M4802 Spinal stenosis, cervical region: Secondary | ICD-10-CM | POA: Diagnosis not present

## 2022-07-27 LAB — CBC WITH DIFFERENTIAL/PLATELET
Abs Immature Granulocytes: 0.01 10*3/uL (ref 0.00–0.07)
Basophils Absolute: 0.1 10*3/uL (ref 0.0–0.1)
Basophils Relative: 1 %
Eosinophils Absolute: 0.2 10*3/uL (ref 0.0–0.5)
Eosinophils Relative: 3 %
HCT: 34.1 % — ABNORMAL LOW (ref 36.0–46.0)
Hemoglobin: 11.5 g/dL — ABNORMAL LOW (ref 12.0–15.0)
Immature Granulocytes: 0 %
Lymphocytes Relative: 45 %
Lymphs Abs: 2.8 10*3/uL (ref 0.7–4.0)
MCH: 29 pg (ref 26.0–34.0)
MCHC: 33.7 g/dL (ref 30.0–36.0)
MCV: 85.9 fL (ref 80.0–100.0)
Monocytes Absolute: 0.6 10*3/uL (ref 0.1–1.0)
Monocytes Relative: 9 %
Neutro Abs: 2.6 10*3/uL (ref 1.7–7.7)
Neutrophils Relative %: 42 %
Platelets: 427 10*3/uL — ABNORMAL HIGH (ref 150–400)
RBC: 3.97 MIL/uL (ref 3.87–5.11)
RDW: 12.1 % (ref 11.5–15.5)
WBC: 6.3 10*3/uL (ref 4.0–10.5)
nRBC: 0 % (ref 0.0–0.2)

## 2022-07-27 LAB — ABO/RH: ABO/RH(D): A POS

## 2022-07-27 LAB — TYPE AND SCREEN
ABO/RH(D): A POS
Antibody Screen: NEGATIVE

## 2022-07-27 LAB — VITAMIN B12: Vitamin B-12: 921 pg/mL — ABNORMAL HIGH (ref 180–914)

## 2022-07-27 LAB — HIV ANTIBODY (ROUTINE TESTING W REFLEX): HIV Screen 4th Generation wRfx: NONREACTIVE

## 2022-07-27 LAB — MRSA NEXT GEN BY PCR, NASAL: MRSA by PCR Next Gen: NOT DETECTED

## 2022-07-27 MED ORDER — VANCOMYCIN HCL 750 MG/150ML IV SOLN
750.0000 mg | Freq: Two times a day (BID) | INTRAVENOUS | Status: DC
  Administered 2022-07-27 – 2022-07-29 (×4): 750 mg via INTRAVENOUS
  Filled 2022-07-27 (×5): qty 150

## 2022-07-27 MED ORDER — ACETAMINOPHEN 325 MG PO TABS
650.0000 mg | ORAL_TABLET | Freq: Four times a day (QID) | ORAL | Status: DC | PRN
  Administered 2022-07-29: 650 mg via ORAL
  Filled 2022-07-27: qty 2

## 2022-07-27 MED ORDER — ONDANSETRON HCL 4 MG/2ML IJ SOLN: 4.0000 mg | Freq: Four times a day (QID) | INTRAMUSCULAR | Status: DC | PRN

## 2022-07-27 MED ORDER — VANCOMYCIN HCL 1250 MG/250ML IV SOLN
1250.0000 mg | Freq: Once | INTRAVENOUS | Status: AC
  Administered 2022-07-27: 1250 mg via INTRAVENOUS
  Filled 2022-07-27: qty 250

## 2022-07-27 MED ORDER — ACETAMINOPHEN 650 MG RE SUPP: 650.0000 mg | Freq: Four times a day (QID) | RECTAL | Status: DC | PRN

## 2022-07-27 MED ORDER — PIPERACILLIN-TAZOBACTAM 3.375 G IVPB
3.3750 g | Freq: Three times a day (TID) | INTRAVENOUS | Status: DC
  Administered 2022-07-27 – 2022-07-29 (×7): 3.375 g via INTRAVENOUS
  Filled 2022-07-27 (×7): qty 50

## 2022-07-27 MED ORDER — ORAL CARE MOUTH RINSE: 15.0000 mL | OROMUCOSAL | Status: DC | PRN

## 2022-07-27 MED ORDER — LACTATED RINGERS IV SOLN: INTRAVENOUS | Status: AC

## 2022-07-27 MED ORDER — ONDANSETRON HCL 4 MG PO TABS: 4.0000 mg | ORAL_TABLET | Freq: Four times a day (QID) | ORAL | Status: DC | PRN

## 2022-07-27 MED ORDER — PIPERACILLIN-TAZOBACTAM 3.375 G IVPB 30 MIN
3.3750 g | Freq: Once | INTRAVENOUS | Status: AC
  Administered 2022-07-27: 3.375 g via INTRAVENOUS
  Filled 2022-07-27: qty 50

## 2022-07-27 NOTE — Progress Notes (Signed)
  Transition of Care [facility]) Screening Note   Patient Details  Name: BALI VIELE Date of Birth: [birth year]   Transition of Care [facility]) CM/SW Contact:    Dawayne Patricia, RN Phone Number: 07/27/2022, 10:02 AM    Transition of Care Department [facility]) has reviewed patient and note that pt is from home w/ family. Plan for cervical decompression, instrumentation and fusion at C3-6 in OR later today. We will continue to monitor patient advancement through interdisciplinary progression rounds. If new patient transition needs arise, please place a TOC consult.
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
## 2022-07-27 NOTE — Consult Note (Signed)
   Providing Compassionate, Quality Care - Together  Neurosurgery Consult  Referring physician: ED MD Reason for referral: Cervical stenosis with myelopathy  Chief Complaint: Difficulty walking, numbness tingling in the hands  History of Present Illness: This is a 67 year old female presented to the ER with complaints of approximately 2-week history of worsening neck pain and paresthesias in her arms as well as numbness and tingling in her feet.  She states she has had difficulty with feelings in her hands and feet.  She states she has had difficulty walking that is been ongoing for approximately 1 year.  She also complains of generalized weakness and feeling like her extremities are "heavy".  She denies any bowel or bladder changes.  She does complain of significant lightening type sensation whenever she extends her neck that radiates into her arms and back.   Medications: I have reviewed the patient's current medications. Allergies: No Known Allergies  History reviewed. No pertinent family history. Social History:  has no history on file for tobacco use, alcohol use, and drug use.  ROS: All pertinent positives and negatives are listed in HPI above  Physical Exam:  Vital signs in last 24 hours: Temp:  [98 F (36.7 C)-98.3 F (36.8 C)] 98 F (36.7 C) (07/25 1814) Pulse Rate:  [58-128] 65 (07/26 0746) Resp:  [11-18] 14 (07/26 0217) BP: (138-182)/(65-125) 153/88 (07/26 0700) SpO2:  [91 %-98 %] 96 % (07/26 0746) PE: Awake alert Oriented x 3, no acute distress, PERRLA Cranial nerves II through XII intact Bilateral upper/lower extremity 4+/5 throughout Brisk Hoffmann's bilateral upper extremities Deep tendon reflexes 3/4 in the uppers and lowers bilaterally No clonus Decree sensation to light touch in the bilateral hands and feet  Impression/Assessment:  67 year old female with  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 -Recommend surgical intervention in the form of posterior cervical decompression, instrumentation and fusion at C3-6 due to her cervical myelopathy and concern for epidural abscess.  We will culture intraoperative findings.  I discussed all risks, benefits and expected outcomes with the patient, she agreed to proceed.  Also discussed this with her family member at bedside. -May need ID consult pending intraoperative findings -Tentatively scheduled for this afternoon -MRI cervical spine with and without contrast reviewed, there is fluid collection along the posterior aspect of the thecal sac from C3 to see 5 that demonstrates enhancement and there is severe stenosis with cord signal change due to stenosis that is multifactorial at C3-C5 with moderate canal stenosis at C5-6.    Thank you for allowing me to participate in this patient's care.  Please do not hesitate to call with questions or concerns.   Elwin Sleight, [facility]

## 2022-07-27 NOTE — ED Notes (Addendum)
ED TO INPATIENT HANDOFF REPORT  ED Nurse Name and Phone #: Bladimir Auman/ [phone]  S Name/Age/Gender Kristen Bautista 67 y.o. female Room/Bed: 001C/001C  Code Status   Code Status: DNR  Home/SNF/Other Home Patient oriented to: self, place, time, and situation Is this baseline? Yes   Triage Complete: Triage complete  Chief Complaint Cervical myelopathy [facility]) [G95.9]  Triage Note Patient BIB EMS from home. EMS states family called due to weakness. EMS states family said that patient was dead weight and unable to move today, which is when they called. Patient was unable to stand and stated that she felt really heavy. Patient can lift both arms. Patient states her fingers feel cold and tingly. Patient can lift both legs after telling herself to lift them. EMS states patient normally has no problems with ADLs. Patient is A&Ox4. Patient states this started 2-3 days ago.   Allergies No Known Allergies  Level of Care/Admitting Diagnosis ED Disposition     ED Disposition  Admit   Condition  --   Comment  Hospital Area: [facility] [[id number]]  Level of Care: Med-Surg [16]  May admit patient to [facility] or [facility] if equivalent level of care is available:: No  Covid Evaluation: Asymptomatic - no recent exposure (last 10 days) testing not required  [facility]: [id number]  Admitting Physician: Bridgett Larsson [place]  Attending Physician: Bridgett Larsson, ERIC [id number]  Certification:: I certify this patient will need inpatient services for at least 2 midnights  Estimated Length of Stay: 4          B Medical/Surgery History No past medical history on file. Past Surgical History:  Procedure Laterality Date   ABDOMINAL HERNIA REPAIR       A IV Location/Drains/Wounds Patient Lines/Drains/Airways Status     Active Line/Drains/Airways     Name Placement date Placement time Site Days   Peripheral IV 07/26/22 22 G Left;Posterior Hand 07/26/22  1810  Hand   1   External Urinary Catheter 07/26/22  1809  --  1            Intake/Output Last 24 hours No intake or output data in the 24 hours ending 07/27/22 0107  Labs/Imaging Results for orders placed or performed during the hospital encounter of 07/26/22 (from the past 48 hour(s))  Ammonia     Status: None   Collection Time: 07/26/22  5:27 PM  Result Value Ref Range   Ammonia 23 9 - 35 umol/L    Comment: Performed at [facility], [place] [address], [place], [place] [address]  Comprehensive metabolic panel     Status: Abnormal   Collection Time: 07/26/22  6:11 PM  Result Value Ref Range   Sodium 139 135 - 145 mmol/L   Potassium 3.9 3.5 - 5.1 mmol/L   Chloride 104 98 - 111 mmol/L   CO2 24 22 - 32 mmol/L   Glucose, Bld 86 70 - 99 mg/dL    Comment: Glucose reference range applies only to samples taken after fasting for at least 8 hours.   BUN 13 8 - 23 mg/dL   Creatinine, Ser 0.84 0.44 - 1.00 mg/dL   Calcium 9.6 8.9 - 10.3 mg/dL   Total Protein 7.9 6.5 - 8.1 g/dL   Albumin 3.1 (L) 3.5 - 5.0 g/dL   AST 25 15 - 41 U/L   ALT 13 0 - 44 U/L   Alkaline Phosphatase 62 38 - 126 U/L   Total Bilirubin 0.4  0.3 - 1.2 mg/dL   GFR, Estimated >60 >60 mL/min    Comment: (NOTE) Calculated using the CKD-EPI Creatinine Equation (2021)    Anion gap 11 5 - 15    Comment: Performed at [facility] [address], [place], [place] [address]  Protime-INR     Status: None   Collection Time: 07/26/22  6:11 PM  Result Value Ref Range   Prothrombin Time 13.9 11.4 - 15.2 seconds   INR 1.1 0.8 - 1.2    Comment: (NOTE) INR goal varies based on device and disease states. Performed at [facility], [place] [address] [place], [place] [address]   Magnesium     Status: None   Collection Time: 07/26/22  6:11 PM  Result Value Ref Range   Magnesium 2.0 1.7 - 2.4 mg/dL    Comment: Performed at [facility] [address], [place], [place] [address]  CK     Status: None   Collection  Time: 07/26/22  6:11 PM  Result Value Ref Range   Total CK 42 38 - 234 U/L    Comment: Performed at [facility], [place] [address] [place], [place] [address]  Ethanol     Status: None   Collection Time: 07/26/22  6:11 PM  Result Value Ref Range   Alcohol, Ethyl (B) <10 <10 mg/dL    Comment: (NOTE) Lowest detectable limit for serum alcohol is 10 mg/dL.  For medical purposes only. Performed at [facility], [place] [address], [place], [place] [address]    MR CERVICAL SPINE W CONTRAST  Result Date: 07/26/2022 CLINICAL DATA:  Further characterization of fluid collection EXAM: MRI CERVICAL SPINE WITH CONTRAST TECHNIQUE: Multiplanar, multisequence MR imaging of the cervical spine was performed following the administration of intravenous contrast. COMPARISON:  Correlation is made with 07/26/2022 MRI cervical spine without contrast FINDINGS: The previously noted fluid collection along the posterior aspect of the thecal sac from C2-C3 disc C4-C5 demonstrates enhancement. In addition, there is enhancing material circumferentially around the thecal sac from C2-C3 to C5-C6. Enhancement in the interspinous ligament and paraspinous musculature of C3-C6 (series 3, images 6-9). No abnormal spinal cord enhancement. No abnormal osseous or disc enhancement. IMPRESSION: 1. The previously noted fluid collection along the posterior aspect of the thecal sac from C2-C3 to C4-C5 demonstrates enhancement. In addition, there is enhancing material circumferentially around the thecal sac from C2-C3 to C5-C6. Findings are concerning for epidural abscess. 2. Enhancement in the interspinous ligaments and paraspinous musculature from C3-C6, which may be infectious or inflammatory. 3. No abnormal spinal cord enhancement. Imaging results were communicated on 07/26/2022 at 11:42 pm to provider Dr. Lorrin Goodell via secure text paging. Electronically Signed   By: Merilyn Baba M.D.   On: 07/26/2022 23:42   MR Cervical Spine Wo  Contrast  Result Date: 07/26/2022 CLINICAL DATA:  Myelopathy EXAM: MRI CERVICAL SPINE WITHOUT CONTRAST TECHNIQUE: Multiplanar, multisequence MR imaging of the cervical spine was performed. No intravenous contrast was administered. COMPARISON:  No prior MRI, correlation is made with 09/18/2021 CT cervical spine FINDINGS: Alignment: No significant listhesis. Vertebrae: No acute fracture or suspicious osseous lesion. Congenitally short pedicles, which narrow the AP diameter of the spinal canal. Cord: Compression of the spinal cord at C3-C4 (series 6, image 19), with increased T2 signal in the spinal cord just inferior to the disc level (series 6, images 20-21), as well as in the left aspect of the cord at C4-C5, where there  is additional cord deformation. The spinal cord is otherwise normal in morphology and signal. T2 hyperintense material along the posterior aspect of the thecal sac from C2-C3 to C4-C5 (series 2, image 8-9 and series 6, image 17), which contributes to the aforementioned compression, of indeterminate etiology. There also appears to be T2 hyperintense material subjacent to the ligamentum flavum (series 6, image 18), which contributes to the compression. Posterior Fossa, vertebral arteries, paraspinal tissues: Increased T2 signal in the interspinous ligaments between C2, C3, and C4 (series 4, image 9), and to a lesser extent at C1-C2. Possible enlargement/edema of the interspinous ligament at C3-C4, possibly extending into the ligamentum flavum, contributing to the aforementioned spinal canal stenosis at C3-C4 (series 4, image 9). Disc levels: C2-C3: No significant disc bulge. Ligamentum flavum hypertrophy. Moderate spinal canal stenosis at the disc level, with moderate to severe spinal canal stenosis just below the disc level, secondary to a small collection along the left greater than right aspect of the spinal canal (series 6, image 17). No neural foraminal narrowing. C3-C4: Mild disc bulge.  Ligamentum flavum hypertrophy, with a small fluid collection along the posterior aspect of the thecal sac and likely ligamentum flavum, which causes severe spinal canal stenosis and compression. The AP diameter of the spinal cord at this level is approximately 3 mm (series 6, image 19). Mild bilateral neural foraminal narrowing. C4-C5: Mild disc bulge with superimposed right central to subarticular protrusion, which indents and deforms the right greater than left aspect of the spinal cord. Severe spinal canal stenosis. Ligamentum flavum hypertrophy. Mild left and moderate right neural foraminal narrowing. C5-C6: Mild disc bulge. Facet and uncovertebral hypertrophy. Mild-to-moderate spinal canal stenosis. Mild-to-moderate bilateral neural foraminal narrowing. C6-C7: Mild disc bulge. Facet and uncovertebral hypertrophy. No spinal canal stenosis. No neural foraminal narrowing. C7-T1: No significant disc bulge. Facet and uncovertebral hypertrophy. Mild bilateral neural foraminal narrowing. IMPRESSION: 1. Severe spinal canal stenosis at C3-C4 and C4-C5, with cord compression at C3-C4 and increased T2 signal in the spinal cord just inferior to the C3-C4 disc level and in the left aspect of the cord at C4-C5, concerning for cord edema. 2. T2 hyperintense material along the posterior aspect of the thecal sac from C2-C3 to C4-C5, which contributes to the spinal canal stenosis at C3-C4 and C4-C5. This collection is of indeterminate etiology, and may represent a small hematoma, although a small epidural abscess is not entirely excluded. 3. Increased T2 signal in the interspinous ligaments between C2-C4, concerning for ligamentous injury. 4. C5-C6 mild-to-moderate spinal canal stenosis and mild-to-moderate bilateral neural foraminal narrowing. These findings were discussed by telephone on 07/26/2022 at 8:46 pm with provider Dr. Lorrin Goodell. Electronically Signed   By: Merilyn Baba M.D.   On: 07/26/2022 21:24    Pending  Labs Unresulted Labs (From admission, onward)     Start     Ordered   07/26/22 2350  Culture, blood (Routine X 2) w Reflex to ID Panel  BLOOD CULTURE X 2,   R (with TIMED occurrences)      07/26/22 2349   07/26/22 2000  CBC with Differential/Platelet  Once,   R        07/26/22 2000   07/26/22 1726  CBC with Differential  Once,   STAT        07/26/22 1729   07/26/22 1726  Vitamin B12  Once,   URGENT        07/26/22 1729   Signed and Held  HIV Antibody (routine testing w rflx)  (  HIV Antibody (Routine testing w reflex) panel)  Add-on,   R        Signed and Held            Vitals/Pain Today's Vitals   07/26/22 1657 07/26/22 1659 07/26/22 2028 07/27/22 0053  BP: (!) 157/85  (!) 174/94   Pulse: 96  71   Resp: 18  14   Temp: 98.5 F (36.9 C)  98.3 F (36.8 C) 98 F (36.7 C)  TempSrc: Oral   Oral  SpO2: 98%  99%   Weight:  59 kg    Height:  5' 7"$  (1.702 m)    PainSc:  0-No pain      Isolation Precautions No active isolations  Medications Medications  vancomycin (VANCOREADY) IVPB 1250 mg/250 mL (has no administration in time range)  piperacillin-tazobactam (ZOSYN) IVPB 3.375 g (3.375 g Intravenous New Bag/Given 07/27/22 0057)  vancomycin (VANCOREADY) IVPB 750 mg/150 mL (has no administration in time range)  piperacillin-tazobactam (ZOSYN) IVPB 3.375 g (has no administration in time range)  gadobutrol (GADAVIST) 1 MMOL/ML injection 6 mL (6 mLs Intravenous Contrast Given 07/26/22 2225)    Mobility walks with person assist     Focused Assessments     R Recommendations: See Admitting Provider Note  Report given to:   Additional Notes: Pt came in with weakness. She was at home when she was unable to stand up and felt super weak. She's being admitted for cervical stenosis of the spine. Right now she's getting Zosyn and still needs Vanc. Her MRI showed a possible epidural abscess so she's getting antibiotics. Blood cultures have already been collected and she has a 22 G  in the L hand. She's A&Ox4, has a purewick on, and her cousin is at bedside.

## 2022-07-27 NOTE — Progress Notes (Signed)
Initial Nutrition Assessment  DOCUMENTATION CODES:   Non-severe (moderate) malnutrition in context of social or environmental circumstances  INTERVENTION:   Once diet advanced recommend offer Ensure Enlive po BID, each supplement provides 350 kcal and 20 grams of protein. Discussed protein shake options for home Discussed importance of adequate nutrition intake, especially protein post operatively   Nutrition plan recommendations added to AVS   NUTRITION DIAGNOSIS:   Moderate Malnutrition related to social / environmental circumstances (ETOH use) as evidenced by mild muscle depletion, energy intake < 75% for > or equal to 1 month.   GOAL:   Patient will meet greater than or equal to 90% of their needs  MONITOR:   PO intake, Supplement acceptance  REASON FOR ASSESSMENT:   Malnutrition Screening Tool    ASSESSMENT:   Pt with no known PMH admitted with cervical stenosis after 2 week hx of worsening neck pain, bilateral arm paresthesias and intermittent paresthesias of her feet. Alcohol use reported as 6 beers per day.    Plan for cervical fusion today, pt NPO for procedure.   Pt reports no recent weight changes. Her weight varies from 127-130 lb, but she does report a poor appetite.  Until recently she was working 3rd shift. From 5 pm to 5 am. She lives alone and when she gets home she will drink several beers (per H&P she has 6) then will possibly eat a sandwich like a cheese sandwich before bed. She wakes around lunch time and has whatever she has a taste for which could be cereal or pancakes or egg and fried potatoes. She takes her dinner with her to work which is usually a bologna and cheese sandwich with chips. Her PCP prescribed her ensure to drink at home which she does sometimes but not always due to cost.  She has not had any issues with mobility.   Medications reviewed and include:  LR @ 50 ml/hr  Labs reviewed:  Vitamin B12: 921    NUTRITION - FOCUSED  PHYSICAL EXAM:  Flowsheet Row Most Recent Value  Orbital Region No depletion  Upper Arm Region No depletion  Thoracic and Lumbar Region No depletion  Buccal Region No depletion  Temple Region Mild depletion  Clavicle Bone Region No depletion  Clavicle and Acromion Bone Region No depletion  Scapular Bone Region No depletion  Dorsal Hand Mild depletion  Patellar Region Mild depletion  Anterior Thigh Region Mild depletion  Posterior Calf Region Mild depletion  Edema (RD Assessment) None  Hair Reviewed  Eyes Reviewed  Mouth Reviewed  Skin Reviewed  Nails Reviewed       Diet Order:   Diet Order             Diet NPO time specified  Diet effective midnight                   EDUCATION NEEDS:   Education needs have been addressed  Skin:  Skin Assessment: Reviewed RN Assessment  Last BM:  unknown  Height:   Ht Readings from Last 1 Encounters:  07/26/22 5' 7"$  (1.702 m)    Weight:   Wt Readings from Last 1 Encounters:  07/27/22 58.6 kg    Ideal Body Weight:     BMI:  Body mass index is 20.23 kg/m.  Estimated Nutritional Needs:   Kcal:  1700-1900  Protein:  85-100 grams  Fluid:  >1.7 L/day  Lockie Pares., RD, LDN, CNSC See AMiON for contact information

## 2022-07-27 NOTE — Progress Notes (Signed)
Orthopedic Tech Progress Note Patient Details:  Kristen Bautista 1955/06/24 [id number]  Ortho Devices Type of Ortho Device: Soft collar Ortho Device/Splint Interventions: Ordered, Application, Adjustment   Post Interventions Patient Tolerated: Well Instructions Provided: Care of device, Adjustment of device  [name] 07/27/2022, 3:25 AM

## 2022-07-27 NOTE — Progress Notes (Signed)
Pharmacy Antibiotic Note  Kristen Bautista is a 67 y.o. female admitted on 07/26/2022 with concern for epidural abscess.  Pharmacy has been consulted for vancomycin and Zosyn dosing.  Plan: Vancomycin 1263m x1 then 7539mIV every 12 hours.  Goal trough 15-20 mcg/mL. Zosyn 3.375g IV every 8 hours (4-hour infusion).  Height: 5' 7"$  (170.2 cm) Weight: 59 kg (130 lb) IBW/kg (Calculated) : 61.6  Temp (24hrs), Avg:98.4 F (36.9 C), Min:98.3 F (36.8 C), Max:98.5 F (36.9 C)  Recent Labs  Lab 07/20/22 0400 07/26/22 1811  WBC 9.8  --   CREATININE 0.70 0.84    Estimated Creatinine Clearance: 61.4 mL/min (by C-G formula based on SCr of 0.84 mg/dL).    No Known Allergies   Thank you for allowing pharmacy to be a part of this patient's care.  VeWynona NeatPharmD, BCPS  07/27/2022 12:49 AM

## 2022-07-27 NOTE — Anesthesia Preprocedure Evaluation (Signed)
Anesthesia Evaluation  Patient identified by MRN, date of birth, ID band Patient awake    Reviewed: Allergy & Precautions, NPO status , Patient's Chart, lab work & pertinent test results  Airway Mallampati: II  TM Distance: >3 FB Neck ROM: Full    Dental  (+) Dental Advisory Given, Edentulous Upper, Edentulous Lower   Pulmonary Current Smoker   Pulmonary exam normal breath sounds clear to auscultation       Cardiovascular negative cardio ROS Normal cardiovascular exam Rhythm:Regular Rate:Normal     Neuro/Psych negative neurological ROS     GI/Hepatic negative GI ROS, Neg liver ROS,,,  Endo/Other  negative endocrine ROS    Renal/GU negative Renal ROS     Musculoskeletal negative musculoskeletal ROS (+)    Abdominal   Peds  Hematology  (+) Blood dyscrasia, anemia   Anesthesia Other Findings   Reproductive/Obstetrics                             Anesthesia Physical Anesthesia Plan  ASA: 3  Anesthesia Plan: General   Post-op Pain Management: Ofirmev IV (intra-op)*   Induction: Intravenous  PONV Risk Score and Plan: 3 and Ondansetron, Dexamethasone and Treatment may vary due to age or medical condition  Airway Management Planned: Oral ETT and Video Laryngoscope Planned  Additional Equipment: Arterial line  Intra-op Plan:   Post-operative Plan: Possible Post-op intubation/ventilation  Informed Consent: I have reviewed the patients History and Physical, chart, labs and discussed the procedure including the risks, benefits and alternatives for the proposed anesthesia with the patient or authorized representative who has indicated his/her understanding and acceptance.   Patient has DNR.  Discussed DNR with patient and Suspend DNR.   Dental advisory given  Plan Discussed with: CRNA  Anesthesia Plan Comments:        Anesthesia Quick Evaluation

## 2022-07-27 NOTE — Discharge Instructions (Signed)
[facility] Stay Proper nutrition can help your body recover from illness and injury.   Foods and beverages high in protein, vitamins, and minerals help rebuild muscle loss, promote healing, & reduce fall risk.   In addition to eating healthy foods, a nutrition shake is an easy, delicious way to get the nutrition you need during and after your hospital stay  It is recommended that you continue to drink 2 bottles per day of:       Ensure Plus or Carnation Instant Breakfast for at least 1 month (30 days) after your hospital stay   Tips for adding a nutrition shake into your routine: As allowed, drink one with vitamins or medications instead of water or juice Enjoy one as a tasty mid-morning or afternoon snack Drink cold or make a milkshake out of it Drink one instead of milk with cereal or snacks Use as a coffee creamer   Available at the following grocery stores and pharmacies:           * [place] [phone]            For COUPONS visit: www.ensure.com/join or http://[email or web address]-[email or web address]/   Suggested Substitutions Ensure Plus = Boost Plus = Carnation Breakfast Essentials = Boost Compact Ensure Active Clear = Boost Breeze Glucerna Shake = Boost Glucose Control = Carnation Breakfast Essentials SUGAR FREE

## 2022-07-28 ENCOUNTER — Encounter (HOSPITAL_COMMUNITY)
Admission: EM | Disposition: A | Discharge status: Rehabilitation Facility | Payer: Self-pay | Source: Home / Self Care | Attending: Internal Medicine

## 2022-07-28 ENCOUNTER — Inpatient Hospital Stay (HOSPITAL_COMMUNITY): Payer: No Typology Code available for payment source

## 2022-07-28 ENCOUNTER — Encounter (HOSPITAL_COMMUNITY): Payer: Self-pay | Admitting: Internal Medicine

## 2022-07-28 ENCOUNTER — Inpatient Hospital Stay (HOSPITAL_COMMUNITY): Payer: No Typology Code available for payment source | Admitting: Anesthesiology

## 2022-07-28 ENCOUNTER — Other Ambulatory Visit: Payer: Self-pay

## 2022-07-28 DIAGNOSIS — D649 Anemia, unspecified: Secondary | ICD-10-CM

## 2022-07-28 DIAGNOSIS — F17210 Nicotine dependence, cigarettes, uncomplicated: Secondary | ICD-10-CM | POA: Diagnosis not present

## 2022-07-28 DIAGNOSIS — F17200 Nicotine dependence, unspecified, uncomplicated: Secondary | ICD-10-CM | POA: Diagnosis not present

## 2022-07-28 DIAGNOSIS — G959 Disease of spinal cord, unspecified: Secondary | ICD-10-CM

## 2022-07-28 DIAGNOSIS — M4712 Other spondylosis with myelopathy, cervical region: Secondary | ICD-10-CM | POA: Diagnosis not present

## 2022-07-28 DIAGNOSIS — M4802 Spinal stenosis, cervical region: Secondary | ICD-10-CM | POA: Diagnosis not present

## 2022-07-28 DIAGNOSIS — G992 Myelopathy in diseases classified elsewhere: Secondary | ICD-10-CM | POA: Diagnosis not present

## 2022-07-28 DIAGNOSIS — E440 Moderate protein-calorie malnutrition: Secondary | ICD-10-CM | POA: Insufficient documentation

## 2022-07-28 DIAGNOSIS — Z981 Arthrodesis status: Secondary | ICD-10-CM | POA: Diagnosis not present

## 2022-07-28 DIAGNOSIS — G062 Extradural and subdural abscess, unspecified: Secondary | ICD-10-CM | POA: Diagnosis not present

## 2022-07-28 HISTORY — PX: POSTERIOR CERVICAL FUSION/FORAMINOTOMY: SHX5038

## 2022-07-28 LAB — COMPREHENSIVE METABOLIC PANEL
ALT: 20 U/L (ref 0–44)
AST: 38 U/L (ref 15–41)
Albumin: 2.7 g/dL — ABNORMAL LOW (ref 3.5–5.0)
Alkaline Phosphatase: 53 U/L (ref 38–126)
Anion gap: 9 (ref 5–15)
BUN: 18 mg/dL (ref 8–23)
CO2: 25 mmol/L (ref 22–32)
Calcium: 9 mg/dL (ref 8.9–10.3)
Chloride: 103 mmol/L (ref 98–111)
Creatinine, Ser: 1.02 mg/dL — ABNORMAL HIGH (ref 0.44–1.00)
GFR, Estimated: >60 mL/min (ref >60)
Glucose, Bld: 79 mg/dL (ref 70–99)
Potassium: 3.7 mmol/L (ref 3.5–5.1)
Sodium: 137 mmol/L (ref 135–145)
Total Bilirubin: 0.5 mg/dL (ref 0.3–1.2)
Total Protein: 7.1 g/dL (ref 6.5–8.1)

## 2022-07-28 LAB — CBC
HCT: 31.9 % — ABNORMAL LOW (ref 36.0–46.0)
Hemoglobin: 10.9 g/dL — ABNORMAL LOW (ref 12.0–15.0)
MCH: 29.1 pg (ref 26.0–34.0)
MCHC: 34.2 g/dL (ref 30.0–36.0)
MCV: 85.1 fL (ref 80.0–100.0)
Platelets: 385 10*3/uL (ref 150–400)
RBC: 3.75 MIL/uL — ABNORMAL LOW (ref 3.87–5.11)
RDW: 12 % (ref 11.5–15.5)
WBC: 5.8 10*3/uL (ref 4.0–10.5)
nRBC: 0 % (ref 0.0–0.2)

## 2022-07-28 LAB — MAGNESIUM: Magnesium: 1.7 mg/dL (ref 1.7–2.4)

## 2022-07-28 SURGERY — POSTERIOR CERVICAL FUSION/FORAMINOTOMY LEVEL 3
Anesthesia: General | Site: Neck

## 2022-07-28 MED ORDER — METHOCARBAMOL 500 MG PO TABS
500.0000 mg | ORAL_TABLET | Freq: Four times a day (QID) | ORAL | Status: DC | PRN
  Administered 2022-07-29 – 2022-07-30 (×4): 500 mg via ORAL
  Filled 2022-07-28 (×4): qty 1

## 2022-07-28 MED ORDER — BUPIVACAINE HCL (PF) 0.5 % IJ SOLN
INTRAMUSCULAR | Status: DC | PRN
  Administered 2022-07-28: 5 mL

## 2022-07-28 MED ORDER — THROMBIN 5000 UNITS EX SOLR
OROMUCOSAL | Status: DC | PRN
  Administered 2022-07-28 (×2): 5 mL via TOPICAL

## 2022-07-28 MED ORDER — FENTANYL CITRATE (PF) 250 MCG/5ML IJ SOLN
INTRAMUSCULAR | Status: DC | PRN
  Administered 2022-07-28: 50 ug via INTRAVENOUS
  Administered 2022-07-28: 25 ug via INTRAVENOUS
  Administered 2022-07-28: 100 ug via INTRAVENOUS
  Administered 2022-07-28 (×3): 50 ug via INTRAVENOUS

## 2022-07-28 MED ORDER — MIDAZOLAM HCL 5 MG/5ML IJ SOLN
INTRAMUSCULAR | Status: DC | PRN
  Administered 2022-07-28: 2 mg via INTRAVENOUS

## 2022-07-28 MED ORDER — LIDOCAINE 2% (20 MG/ML) 5 ML SYRINGE
INTRAMUSCULAR | Status: AC
  Filled 2022-07-28: qty 5

## 2022-07-28 MED ORDER — ONDANSETRON HCL 4 MG/2ML IJ SOLN
INTRAMUSCULAR | Status: AC
  Filled 2022-07-28: qty 2

## 2022-07-28 MED ORDER — DEXAMETHASONE SODIUM PHOSPHATE 10 MG/ML IJ SOLN
INTRAMUSCULAR | Status: DC | PRN
  Administered 2022-07-28: 4 mg via INTRAVENOUS

## 2022-07-28 MED ORDER — FENTANYL CITRATE (PF) 250 MCG/5ML IJ SOLN
INTRAMUSCULAR | Status: AC
  Filled 2022-07-28: qty 5

## 2022-07-28 MED ORDER — OXYCODONE HCL 5 MG PO TABS
5.0000 mg | ORAL_TABLET | ORAL | Status: DC | PRN
  Administered 2022-07-28 – 2022-07-29 (×5): 5 mg via ORAL
  Filled 2022-07-28 (×5): qty 1

## 2022-07-28 MED ORDER — THROMBIN 5000 UNITS EX SOLR
CUTANEOUS | Status: AC
  Filled 2022-07-28: qty 5000

## 2022-07-28 MED ORDER — OXYCODONE HCL 5 MG PO TABS
10.0000 mg | ORAL_TABLET | ORAL | Status: DC | PRN
  Administered 2022-07-30 – 2022-08-01 (×10): 10 mg via ORAL
  Filled 2022-07-28 (×10): qty 2

## 2022-07-28 MED ORDER — PHENOL 1.4 % MT LIQD: 1.0000 | OROMUCOSAL | Status: DC | PRN

## 2022-07-28 MED ORDER — LACTATED RINGERS IV SOLN: INTRAVENOUS | Status: DC | PRN

## 2022-07-28 MED ORDER — PROMETHAZINE HCL 25 MG/ML IJ SOLN: 6.2500 mg | INTRAMUSCULAR | Status: DC | PRN

## 2022-07-28 MED ORDER — THROMBIN 20000 UNITS EX SOLR
CUTANEOUS | Status: AC
  Filled 2022-07-28: qty 20000

## 2022-07-28 MED ORDER — METHOCARBAMOL 1000 MG/10ML IJ SOLN: 500.0000 mg | Freq: Four times a day (QID) | INTRAVENOUS | Status: DC | PRN

## 2022-07-28 MED ORDER — DEXAMETHASONE SODIUM PHOSPHATE 10 MG/ML IJ SOLN
INTRAMUSCULAR | Status: AC
  Filled 2022-07-28: qty 1

## 2022-07-28 MED ORDER — PROPOFOL 10 MG/ML IV BOLUS
INTRAVENOUS | Status: AC
  Filled 2022-07-28: qty 20

## 2022-07-28 MED ORDER — HYDROMORPHONE HCL 1 MG/ML IJ SOLN: 0.2500 mg | INTRAMUSCULAR | Status: DC | PRN

## 2022-07-28 MED ORDER — VANCOMYCIN HCL 1 G IV SOLR
INTRAVENOUS | Status: DC | PRN
  Administered 2022-07-28: 1000 mg via TOPICAL

## 2022-07-28 MED ORDER — SODIUM CHLORIDE 0.9 % IV SOLN: 250.0000 mL | INTRAVENOUS | Status: DC

## 2022-07-28 MED ORDER — ROCURONIUM BROMIDE 10 MG/ML (PF) SYRINGE
PREFILLED_SYRINGE | INTRAVENOUS | Status: DC | PRN
  Administered 2022-07-28 (×2): 20 mg via INTRAVENOUS
  Administered 2022-07-28: 60 mg via INTRAVENOUS

## 2022-07-28 MED ORDER — MENTHOL 3 MG MT LOZG: 1.0000 | LOZENGE | OROMUCOSAL | Status: DC | PRN

## 2022-07-28 MED ORDER — 0.9 % SODIUM CHLORIDE (POUR BTL) OPTIME
TOPICAL | Status: DC | PRN
  Administered 2022-07-28: 1000 mL

## 2022-07-28 MED ORDER — BACITRACIN ZINC 500 UNIT/GM EX OINT
TOPICAL_OINTMENT | CUTANEOUS | Status: AC
  Filled 2022-07-28: qty 28.35

## 2022-07-28 MED ORDER — SODIUM CHLORIDE 0.9% FLUSH: 3.0000 mL | INTRAVENOUS | Status: DC | PRN

## 2022-07-28 MED ORDER — PROPOFOL 10 MG/ML IV BOLUS
INTRAVENOUS | Status: DC | PRN
  Administered 2022-07-28: 30 mg via INTRAVENOUS
  Administered 2022-07-28: 20 mg via INTRAVENOUS
  Administered 2022-07-28 (×2): 50 mg via INTRAVENOUS
  Administered 2022-07-28: 80 mg via INTRAVENOUS

## 2022-07-28 MED ORDER — BUPIVACAINE HCL (PF) 0.5 % IJ SOLN
INTRAMUSCULAR | Status: AC
  Filled 2022-07-28: qty 30

## 2022-07-28 MED ORDER — HYDRALAZINE HCL 20 MG/ML IJ SOLN: 5.0000 mg | Freq: Four times a day (QID) | INTRAMUSCULAR | Status: DC | PRN

## 2022-07-28 MED ORDER — PHENYLEPHRINE HCL (PRESSORS) 10 MG/ML IV SOLN
INTRAVENOUS | Status: DC | PRN
  Administered 2022-07-28: 80 ug via INTRAVENOUS

## 2022-07-28 MED ORDER — MEPERIDINE HCL 25 MG/ML IJ SOLN: 6.2500 mg | INTRAMUSCULAR | Status: DC | PRN

## 2022-07-28 MED ORDER — ACETAMINOPHEN 10 MG/ML IV SOLN
INTRAVENOUS | Status: DC | PRN
  Administered 2022-07-28: 1000 mg via INTRAVENOUS

## 2022-07-28 MED ORDER — MIDAZOLAM HCL 2 MG/2ML IJ SOLN
INTRAMUSCULAR | Status: AC
  Filled 2022-07-28: qty 2

## 2022-07-28 MED ORDER — SUGAMMADEX SODIUM 200 MG/2ML IV SOLN
INTRAVENOUS | Status: DC | PRN
  Administered 2022-07-28: 200 mg via INTRAVENOUS

## 2022-07-28 MED ORDER — LIDOCAINE-EPINEPHRINE 1 %-1:100000 IJ SOLN
INTRAMUSCULAR | Status: AC
  Filled 2022-07-28: qty 1

## 2022-07-28 MED ORDER — ONDANSETRON HCL 4 MG/2ML IJ SOLN
INTRAMUSCULAR | Status: DC | PRN
  Administered 2022-07-28: 4 mg via INTRAVENOUS

## 2022-07-28 MED ORDER — DOCUSATE SODIUM 100 MG PO CAPS
100.0000 mg | ORAL_CAPSULE | Freq: Two times a day (BID) | ORAL | Status: DC
  Administered 2022-07-28 – 2022-08-01 (×8): 100 mg via ORAL
  Filled 2022-07-28 (×8): qty 1

## 2022-07-28 MED ORDER — ROCURONIUM BROMIDE 10 MG/ML (PF) SYRINGE
PREFILLED_SYRINGE | INTRAVENOUS | Status: AC
  Filled 2022-07-28: qty 10

## 2022-07-28 MED ORDER — BACITRACIN ZINC 500 UNIT/GM EX OINT
TOPICAL_OINTMENT | CUTANEOUS | Status: DC | PRN
  Administered 2022-07-28: 1 via TOPICAL

## 2022-07-28 MED ORDER — AMLODIPINE BESYLATE 5 MG PO TABS
5.0000 mg | ORAL_TABLET | Freq: Every day | ORAL | Status: DC
  Administered 2022-07-28 – 2022-08-01 (×5): 5 mg via ORAL
  Filled 2022-07-28 (×5): qty 1

## 2022-07-28 MED ORDER — SODIUM CHLORIDE 0.9% FLUSH
3.0000 mL | Freq: Two times a day (BID) | INTRAVENOUS | Status: DC
  Administered 2022-07-28 – 2022-07-29 (×2): 3 mL via INTRAVENOUS

## 2022-07-28 MED ORDER — ARTIFICIAL TEARS OPHTHALMIC OINT
TOPICAL_OINTMENT | OPHTHALMIC | Status: AC
  Filled 2022-07-28: qty 3.5

## 2022-07-28 MED ORDER — LIDOCAINE 2% (20 MG/ML) 5 ML SYRINGE
INTRAMUSCULAR | Status: DC | PRN
  Administered 2022-07-28: 60 mg via INTRAVENOUS

## 2022-07-28 MED ORDER — AMISULPRIDE (ANTIEMETIC) 5 MG/2ML IV SOLN: 10.0000 mg | Freq: Once | INTRAVENOUS | Status: DC | PRN

## 2022-07-28 MED ORDER — ACETAMINOPHEN 10 MG/ML IV SOLN
INTRAVENOUS | Status: AC
  Filled 2022-07-28: qty 100

## 2022-07-28 MED ORDER — LIDOCAINE-EPINEPHRINE 1 %-1:100000 IJ SOLN
INTRAMUSCULAR | Status: DC | PRN
  Administered 2022-07-28: 5 mL via INTRADERMAL

## 2022-07-28 MED ORDER — VANCOMYCIN HCL 1000 MG IV SOLR
INTRAVENOUS | Status: AC
  Filled 2022-07-28: qty 20

## 2022-07-28 MED ORDER — CHLORHEXIDINE GLUCONATE 0.12 % MT SOLN
OROMUCOSAL | Status: AC
  Filled 2022-07-28: qty 15

## 2022-07-28 MED ORDER — THROMBIN 20000 UNITS EX SOLR
CUTANEOUS | Status: DC | PRN
  Administered 2022-07-28: 100 mL via TOPICAL

## 2022-07-28 SURGICAL SUPPLY — 82 items
ADH SKN CLS APL DERMABOND .7 (GAUZE/BANDAGES/DRESSINGS) ×1
ADH SKN CLS LQ APL DERMABOND (GAUZE/BANDAGES/DRESSINGS) ×1
BAG COUNTER SPONGE SURGICOUNT (BAG) ×1 IMPLANT
BAG SPNG CNTER NS LX DISP (BAG) ×1
BAND INSRT 18 STRL LF DISP RB (MISCELLANEOUS) ×2
BAND RUBBER #18 3X1/16 STRL (MISCELLANEOUS) ×2 IMPLANT
BIT DRILL NEURO 2X3.1 SFT TUCH (MISCELLANEOUS) ×1 IMPLANT
BNDG GAUZE DERMACEA FLUFF 4 (GAUZE/BANDAGES/DRESSINGS) ×1 IMPLANT
BNDG GZE DERMACEA 4 6PLY (GAUZE/BANDAGES/DRESSINGS) ×1
BUR CARBIDE MATCH 3.0 (BURR) IMPLANT
BUR MATCHSTICK NEURO 3.0 LAGG (BURR) IMPLANT
BUR SABER NEURO 2.5 (BURR) IMPLANT
CNTNR URN SCR LID CUP LEK RST (MISCELLANEOUS) ×1 IMPLANT
CONT SPEC 4OZ STRL OR WHT (MISCELLANEOUS) ×1
COVER BACK TABLE 60X90IN (DRAPES) ×1 IMPLANT
COVER MAYO STAND STRL (DRAPES) ×1 IMPLANT
DERMABOND ADVANCED .7 DNX12 (GAUZE/BANDAGES/DRESSINGS) ×1 IMPLANT
DERMABOND ADVANCED .7 DNX6 (GAUZE/BANDAGES/DRESSINGS) IMPLANT
DRAIN JACKSON RD 7FR 3/32 (WOUND CARE) IMPLANT
DRAPE C-ARM 42X72 X-RAY (DRAPES) ×1 IMPLANT
DRAPE LAPAROTOMY 100X72X124 (DRAPES) ×1 IMPLANT
DRAPE MICROSCOPE SLANT 54X150 (MISCELLANEOUS) ×1 IMPLANT
DRAPE SURG 17X23 STRL (DRAPES) ×1 IMPLANT
DRILL NEURO 2X3.1 SOFT TOUCH (MISCELLANEOUS) ×1
DRSG OPSITE POSTOP 4X6 (GAUZE/BANDAGES/DRESSINGS) ×1 IMPLANT
DRSG OPSITE POSTOP 4X8 (GAUZE/BANDAGES/DRESSINGS) IMPLANT
DURAPREP 26ML APPLICATOR (WOUND CARE) ×1 IMPLANT
ELECT BLADE INSULATED 4IN (ELECTROSURGICAL)
ELECT BLADE INSULATED 6.5IN (ELECTROSURGICAL)
ELECT REM PT RETURN 9FT ADLT (ELECTROSURGICAL) ×1
ELECTRODE BLADE INSULATED 4IN (ELECTROSURGICAL) IMPLANT
ELECTRODE BLDE INSULATED 6.5IN (ELECTROSURGICAL) IMPLANT
ELECTRODE REM PT RTRN 9FT ADLT (ELECTROSURGICAL) ×1 IMPLANT
GAUZE 4X4 16PLY ~~LOC~~+RFID DBL (SPONGE) IMPLANT
GAUZE SPONGE 4X4 12PLY STRL (GAUZE/BANDAGES/DRESSINGS) ×1 IMPLANT
GLOVE BIO SURGEON STRL SZ 6.5 (GLOVE) IMPLANT
GLOVE BIO SURGEON STRL SZ7 (GLOVE) IMPLANT
GLOVE BIOGEL PI IND STRL 8 (GLOVE) ×1 IMPLANT
GLOVE ECLIPSE 6.5 STRL STRAW (GLOVE) IMPLANT
GLOVE ECLIPSE 8.0 STRL XLNG CF (GLOVE) ×1 IMPLANT
GLOVE SURG ENC MOIS LTX SZ8 (GLOVE) ×2 IMPLANT
GLOVE SURG UNDER POLY LF SZ8.5 (GLOVE) ×2 IMPLANT
GOWN STRL REUS W/ TWL LRG LVL3 (GOWN DISPOSABLE) IMPLANT
GOWN STRL REUS W/ TWL XL LVL3 (GOWN DISPOSABLE) ×2 IMPLANT
GOWN STRL REUS W/TWL 2XL LVL3 (GOWN DISPOSABLE) IMPLANT
GOWN STRL REUS W/TWL LRG LVL3 (GOWN DISPOSABLE) ×3
GOWN STRL REUS W/TWL XL LVL3 (GOWN DISPOSABLE) ×1
HEMOSTAT POWDER KIT SURGIFOAM (HEMOSTASIS) ×1 IMPLANT
KIT BASIN OR (CUSTOM PROCEDURE TRAY) ×1 IMPLANT
KIT TURNOVER KIT B (KITS) ×1 IMPLANT
MARKER SKIN DUAL TIP RULER LAB (MISCELLANEOUS) ×1 IMPLANT
NDL BLUNT 18X1 FOR OR ONLY (NEEDLE) ×1 IMPLANT
NDL HYPO 25X1 1.5 SAFETY (NEEDLE) ×1 IMPLANT
NDL SPNL 18GX3.5 QUINCKE PK (NEEDLE) ×2 IMPLANT
NEEDLE BLUNT 18X1 FOR OR ONLY (NEEDLE) IMPLANT
NEEDLE HYPO 25X1 1.5 SAFETY (NEEDLE) ×1 IMPLANT
NEEDLE SPNL 18GX3.5 QUINCKE PK (NEEDLE) ×1 IMPLANT
NS IRRIG 1000ML POUR BTL (IV SOLUTION) ×1 IMPLANT
PACK LAMINECTOMY NEURO (CUSTOM PROCEDURE TRAY) ×1 IMPLANT
PAD ARMBOARD 7.5X6 YLW CONV (MISCELLANEOUS) ×3 IMPLANT
PATTIES SURGICAL .5 X1 (DISPOSABLE) IMPLANT
PUTTY BONE 100 VESUVIUS 2.5CC (Putty) IMPLANT
ROD CONTOURED YUKON 3.5X45 (Rod) IMPLANT
ROD YUKON CONTOURED O 3.5X55 (Rod) IMPLANT
SCREW SET SPINAL YUKON (Set) IMPLANT
SCREW SPIN YUKON POLY 03.5X14 (Screw) IMPLANT
SPIKE FLUID TRANSFER (MISCELLANEOUS) ×1 IMPLANT
SPONGE SURGIFOAM ABS GEL 100 (HEMOSTASIS) ×1 IMPLANT
SPONGE T-LAP 4X18 ~~LOC~~+RFID (SPONGE) IMPLANT
STAPLER VISISTAT 35W (STAPLE) IMPLANT
SUT VIC AB 0 CT1 18XCR BRD8 (SUTURE) IMPLANT
SUT VIC AB 0 CT1 27 (SUTURE) ×1
SUT VIC AB 0 CT1 27XBRD ANBCTR (SUTURE) ×1 IMPLANT
SUT VIC AB 0 CT1 8-18 (SUTURE) ×2
SUT VIC AB 2-0 CP2 18 (SUTURE) ×1 IMPLANT
SUT VIC AB 3-0 SH 8-18 (SUTURE) ×1 IMPLANT
SYR 3ML LL SCALE MARK (SYRINGE) ×1 IMPLANT
SYR BULB IRRIG 60ML STRL (SYRINGE) IMPLANT
TOWEL GREEN STERILE (TOWEL DISPOSABLE) ×1 IMPLANT
TOWEL GREEN STERILE FF (TOWEL DISPOSABLE) ×1 IMPLANT
TRAY FOLEY MTR SLVR 16FR STAT (SET/KITS/TRAYS/PACK) ×1 IMPLANT
WATER STERILE IRR 1000ML POUR (IV SOLUTION) ×1 IMPLANT

## 2022-07-28 NOTE — Consult Note (Signed)
[facility]  Total days of antibiotics 3 vanco/piptazo         Reason for Consult: cervical epidural abscess   Referring Physician: gherghe  Principal Problem:   Cervical myelopathy ([place]) Active Problems:   DNR (do not resuscitate)/DNI(Do Not Intubate)   Malnutrition of moderate degree    HPI: Kristen Bautista is a 67 y.o. female admitted on 2/15 with progressive upper and lower extremity weakness. Increasing difficulty with ambulation. MRI showed severe stenosis with cord changes to C3-C^ but also possible Epidural abscess. She was empirically started on piptazo and vancomycin. On 2/17, she was underwent cervical decompression and evacuation of epidural abscess at CD-4, but needed instrumentation from C3-C5.  Past Medical History:  Diagnosis Date   Anemia     Allergies: No Known Allergies    MEDICATIONS:  amLODipine  5 mg Oral Daily   docusate sodium  100 mg Oral BID   sodium chloride flush  3 mL Intravenous Q12H    Social History   Tobacco Use   Smoking status: Every Day    Packs/day: 0.50    Years: 16.00    Total pack years: 8.00    Types: Cigarettes    Passive exposure: Past   Smokeless tobacco: Never  Vaping Use   Vaping Use: Never used  Substance Use Topics   Alcohol use: Yes    Alcohol/week: 42.0 standard drinks of alcohol    Types: 42 Cans of beer per week    Comment: 6 pack of beer/day   Drug use: Yes    Frequency: 2.0 times per week    Types: Marijuana    Comment: occasional    Family History  Problem Relation Age of Onset   High blood pressure Mother     Review of Systems -  Numbness to hands and feet. Weakness to extremities. 12 point ros is otherwise negative  OBJECTIVE: Temp:  [97.5 F (36.4 C)-98.2 F (36.8 C)] 97.6 F (36.4 C) (02/17 1300) Pulse Rate:  [60-76] 65 (02/17 1300) Resp:  [11-20] 17 (02/17 1300) BP: (129-178)/(71-90) 178/85 (02/17 1300) SpO2:  [90 %-100 %] 92 % (02/17 1300) Weight:  [58.6 kg]  58.6 kg (02/17 0500) Physical Exam  Constitutional:  oriented to person, place, and time. appears well-developed and well-nourished. No distress.  HENT: [place]/AT, PERRLA, no scleral icterus. Aspen collar in place Mouth/Throat: Oropharynx is clear and moist. No oropharyngeal exudate.  Cardiovascular: Normal rate, regular rhythm and normal heart sounds. Exam reveals no gallop and no friction rub.  No murmur heard.  Pulmonary/Chest: Effort normal and breath sounds normal. No respiratory distress.  has no wheezes.  Neck = supple, no nuchal rigidity Abdominal: Soft. Bowel sounds are normal.  exhibits no distension. There is no tenderness.  Lymphadenopathy: no cervical adenopathy. No axillary adenopathy Neurological: alert and oriented to person, place, and time. 4/5 strength in upper extremities. Skin: Skin is warm and dry. No rash noted. No erythema.  Psychiatric: a normal mood and affect.  behavior is normal.    LABS: Results for orders placed or performed during the hospital encounter of 07/26/22 (from the past 48 hour(s))  Ammonia     Status: None   Collection Time: 07/26/22  5:27 PM  Result Value Ref Range   Ammonia 23 9 - 35 umol/L    Comment: Performed at [facility] [address], [place], [place] [address]  Comprehensive metabolic panel     Status: Abnormal   Collection  Time: 07/26/22  6:11 PM  Result Value Ref Range   Sodium 139 135 - 145 mmol/L   Potassium 3.9 3.5 - 5.1 mmol/L   Chloride 104 98 - 111 mmol/L   CO2 24 22 - 32 mmol/L   Glucose, Bld 86 70 - 99 mg/dL    Comment: Glucose reference range applies only to samples taken after fasting for at least 8 hours.   BUN 13 8 - 23 mg/dL   Creatinine, Ser 0.84 0.44 - 1.00 mg/dL   Calcium 9.6 8.9 - 10.3 mg/dL   Total Protein 7.9 6.5 - 8.1 g/dL   Albumin 3.1 (L) 3.5 - 5.0 g/dL   AST 25 15 - 41 U/L   ALT 13 0 - 44 U/L   Alkaline Phosphatase 62 38 - 126 U/L   Total Bilirubin 0.4 0.3 - 1.2 mg/dL   GFR, Estimated >60 >60  mL/min    Comment: (NOTE) Calculated using the CKD-EPI Creatinine Equation (2021)    Anion gap 11 5 - 15    Comment: Performed at [facility] [address] [place], [place] [address]  Protime-INR     Status: None   Collection Time: 07/26/22  6:11 PM  Result Value Ref Range   Prothrombin Time 13.9 11.4 - 15.2 seconds   INR 1.1 0.8 - 1.2    Comment: (NOTE) INR goal varies based on device and disease states. Performed at [facility], [place] [address], [place], [place] [address]   Magnesium     Status: None   Collection Time: 07/26/22  6:11 PM  Result Value Ref Range   Magnesium 2.0 1.7 - 2.4 mg/dL    Comment: Performed at [facility] [address] [place], [place] [address]  CK     Status: None   Collection Time: 07/26/22  6:11 PM  Result Value Ref Range   Total CK 42 38 - 234 U/L    Comment: Performed at [facility], [place] [address] [place], [place] [address]  Ethanol     Status: None   Collection Time: 07/26/22  6:11 PM  Result Value Ref Range   Alcohol, Ethyl (B) <10 <10 mg/dL    Comment: (NOTE) Lowest detectable limit for serum alcohol is 10 mg/dL.  For medical purposes only. Performed at [facility], [place] [address] [place], [place] [address]   Culture, blood (Routine X 2) w Reflex to ID Panel     Status: None (Preliminary result)   Collection Time: 07/27/22 12:22 AM   Specimen: BLOOD RIGHT ARM  Result Value Ref Range   Specimen Description BLOOD RIGHT ARM    Special Requests      BOTTLES DRAWN AEROBIC AND ANAEROBIC Blood Culture results may not be optimal due to an excessive volume of blood received in culture bottles   Culture      NO GROWTH 1 DAY Performed at [facility], [place] [address], [place], [place] [address]    Report Status PENDING   ABO/Rh     Status: None   Collection Time: 07/27/22 12:22 AM  Result Value Ref Range   ABO/RH(D)      A POS Performed at [facility], [place] [address] [place],  [place] [address]   Culture, blood (Routine X 2) w Reflex to ID Panel     Status: None (Preliminary result)   Collection Time: 07/27/22 12:24 AM   Specimen: BLOOD RIGHT HAND  Result Value Ref Range   Specimen Description BLOOD RIGHT HAND    Special Requests      BOTTLES DRAWN AEROBIC AND ANAEROBIC Blood Culture results may not be optimal due to an excessive volume of blood received in culture bottles   Culture      NO GROWTH 1 DAY Performed at [facility] [address] [place], [place] [address]    Report Status PENDING   Vitamin B12     Status: Abnormal   Collection Time: 07/27/22 12:29 AM  Result Value Ref Range   Vitamin B-12 921 (H) 180 - 914 pg/mL    Comment: (NOTE) This assay is not validated for testing neonatal or myeloproliferative syndrome specimens for Vitamin B12 levels. Performed at [facility], [place] [address] [place], [place] [address]   CBC with Differential/Platelet     Status: Abnormal   Collection Time: 07/27/22 12:29 AM  Result Value Ref Range   WBC 6.3 4.0 - 10.5 K/uL   RBC 3.97 3.87 - 5.11 MIL/uL   Hemoglobin 11.5 (L) 12.0 - 15.0 g/dL   HCT 34.1 (L) 36.0 - 46.0 %   MCV 85.9 80.0 - 100.0 fL   MCH 29.0 26.0 - 34.0 pg   MCHC 33.7 30.0 - 36.0 g/dL   RDW 12.1 11.5 - 15.5 %   Platelets 427 (H) 150 - 400 K/uL   nRBC 0.0 0.0 - 0.2 %   Neutrophils Relative % 42 %   Neutro Abs 2.6 1.7 - 7.7 K/uL   Lymphocytes Relative 45 %   Lymphs Abs 2.8 0.7 - 4.0 K/uL   Monocytes Relative 9 %   Monocytes Absolute 0.6 0.1 - 1.0 K/uL   Eosinophils Relative 3 %   Eosinophils Absolute 0.2 0.0 - 0.5 K/uL   Basophils Relative 1 %   Basophils Absolute 0.1 0.0 - 0.1 K/uL   Immature Granulocytes 0 %   Abs Immature Granulocytes 0.01 0.00 - 0.07 K/uL    Comment: Performed at [facility] [address] [place], [place] [address]  HIV Antibody (routine testing w rflx)     Status: None   Collection Time: 07/27/22  3:15 AM  Result Value Ref Range   HIV Screen 4th  Generation wRfx Non Reactive Non Reactive    Comment: Performed at [facility], [place] [address] [place], [place] [address]  MRSA Next Gen by PCR, Nasal     Status: None   Collection Time: 07/27/22  7:00 AM   Specimen: Nasal Mucosa; Nasal Swab  Result Value Ref Range   MRSA by PCR Next Gen NOT DETECTED NOT DETECTED    Comment: (NOTE) The GeneXpert MRSA Assay (FDA approved for NASAL specimens only), is one component of a comprehensive MRSA colonization surveillance program. It is not intended to diagnose MRSA infection nor to guide or monitor treatment for MRSA infections. Test performance is not FDA approved in patients less than 66 years old. Performed at [facility], [place] [address] [place], [place] [address]   Type and screen [place]     Status: None   Collection Time: 07/27/22 10:49 AM  Result Value Ref Range   ABO/RH(D) A POS    Antibody Screen NEG    Sample Expiration      07/30/2022,2359 Performed at [facility], [place] [address] [place], [place] [address]   CBC     Status: Abnormal   Collection Time: 07/28/22  2:43 AM  Result Value Ref Range   WBC 5.8 4.0 - 10.5 K/uL   RBC 3.75 (L) 3.87 - 5.11 MIL/uL   Hemoglobin 10.9 (L) 12.0 - 15.0 g/dL   HCT 31.9 (L) 36.0 - 46.0 %   MCV 85.1 80.0 - 100.0 fL   MCH 29.1 26.0 - 34.0 pg   MCHC 34.2 30.0 - 36.0 g/dL   RDW 12.0 11.5 - 15.5 %   Platelets 385 150 - 400 K/uL   nRBC 0.0 0.0 - 0.2 %    Comment: Performed at [facility], [place] [address], [place], [place] [address]  Comprehensive metabolic panel     Status: Abnormal   Collection Time: 07/28/22  2:43 AM  Result Value Ref Range   Sodium 137 135 - 145 mmol/L   Potassium 3.7 3.5 - 5.1 mmol/L   Chloride 103 98 - 111 mmol/L   CO2 25 22 - 32 mmol/L   Glucose, Bld 79 70 - 99 mg/dL    Comment: Glucose reference range applies only to samples taken after fasting for at least 8 hours.   BUN 18 8 - 23 mg/dL   Creatinine, Ser 1.02 (H)  0.44 - 1.00 mg/dL   Calcium 9.0 8.9 - 10.3 mg/dL   Total Protein 7.1 6.5 - 8.1 g/dL   Albumin 2.7 (L) 3.5 - 5.0 g/dL   AST 38 15 - 41 U/L   ALT 20 0 - 44 U/L   Alkaline Phosphatase 53 38 - 126 U/L   Total Bilirubin 0.5 0.3 - 1.2 mg/dL   GFR, Estimated >60 >60 mL/min    Comment: (NOTE) Calculated using the CKD-EPI Creatinine Equation (2021)    Anion gap 9 5 - 15    Comment: Performed at [facility] [address] [place], [place] [address]  Magnesium     Status: None   Collection Time: 07/28/22  2:43 AM  Result Value Ref Range   Magnesium 1.7 1.7 - 2.4 mg/dL    Comment: Performed at [facility] [address], [place], [place] [address]  Aerobic/Anaerobic Culture w Gram Stain (surgical/deep wound)     Status: None (Preliminary result)   Collection Time: 07/28/22  9:03 AM   Specimen: Soft Tissue, Other  Result Value Ref Range   Specimen Description TISSUE    Special Requests CERVICAL 3,4 JOINT    Gram Stain      NO ORGANISMS SEEN NO WBC SEEN Performed at [facility], [place] [address] [place], [place] [address]    Culture PENDING    Report Status PENDING     MICRO: Blood cx 2/16 - ngtd Tissue cx 2/17- no organism of gramstain IMAGING: DG Cervical Spine 2 or 3 views  Result Date: 07/28/2022 CLINICAL DATA:  Portable operative imaging provided for cervical spine fusion. EXAM: CERVICAL SPINE - 2-3 VIEW; DG C-ARM 1-60 MIN-NO REPORT Fluoro time, 4.4 seconds.  Dose: 0.25 mGy. COMPARISON:  None Available. FINDINGS: Two submitted images show placement of posterior fusion hardware at C3, C4, C5 and C6. IMPRESSION: 1. Imaging provided for cervical spine fusion. Please refer to the operative report for further details. Electronically Signed   By: Lajean Manes M.D.   On: 07/28/2022 10:08   DG C-Arm 1-60 Min-No Report  Result Date: 07/28/2022 Fluoroscopy was utilized by the requesting physician.  No radiographic interpretation.   DG C-Arm 1-60 Min-No  Report  Result Date: 07/28/2022 Fluoroscopy was utilized by the requesting physician.  No radiographic interpretation.   MR CERVICAL SPINE W CONTRAST  Result Date: 07/26/2022 CLINICAL DATA:  Further characterization of fluid collection EXAM: MRI CERVICAL SPINE WITH CONTRAST TECHNIQUE: Multiplanar, multisequence MR imaging of the cervical spine was performed following the administration of intravenous contrast. COMPARISON:  Correlation is made with 07/26/2022 MRI cervical spine without contrast FINDINGS: The previously noted fluid collection along the posterior aspect of the thecal sac from C2-C3 disc C4-C5 demonstrates enhancement. In addition, there is enhancing material circumferentially around the thecal sac from C2-C3 to C5-C6. Enhancement in the interspinous ligament and paraspinous musculature of C3-C6 (series 3, images 6-9). No abnormal spinal cord enhancement. No abnormal osseous or disc enhancement. IMPRESSION: 1. The previously noted fluid collection along the posterior aspect of the thecal sac from C2-C3 to C4-C5 demonstrates enhancement. In addition, there is enhancing material circumferentially around the thecal sac from C2-C3 to C5-C6. Findings are concerning for epidural abscess. 2. Enhancement in the interspinous ligaments and paraspinous musculature from C3-C6, which may be infectious or inflammatory. 3. No abnormal spinal cord enhancement. Imaging results were communicated on 07/26/2022 at 11:42 pm to provider Dr. Lorrin Goodell via secure text paging. Electronically Signed   By: Merilyn Baba M.D.   On: 07/26/2022 23:42   MR Cervical Spine Wo Contrast  Result Date: 07/26/2022 CLINICAL DATA:  Myelopathy EXAM: MRI CERVICAL SPINE WITHOUT CONTRAST TECHNIQUE: Multiplanar, multisequence MR imaging of the cervical spine was performed. No intravenous contrast was administered. COMPARISON:  No prior MRI, correlation is made with 09/18/2021 CT cervical spine FINDINGS: Alignment: No significant  listhesis. Vertebrae: No acute fracture or suspicious osseous lesion. Congenitally short pedicles, which narrow the AP diameter of the spinal canal. Cord: Compression of the spinal cord at C3-C4 (series 6, image 19), with increased T2 signal in the spinal cord just inferior to the disc level (series 6, images 20-21), as well as in the left aspect of the cord at C4-C5, where there is additional cord deformation. The spinal cord is otherwise normal in morphology and signal. T2 hyperintense material along the posterior aspect of the thecal sac from C2-C3 to C4-C5 (series 2, image 8-9 and series 6, image 17), which contributes to the aforementioned compression, of indeterminate etiology. There also appears to be T2 hyperintense material subjacent to the ligamentum flavum (series 6, image 18), which contributes to the compression. Posterior Fossa, vertebral arteries, paraspinal tissues: Increased T2 signal in the interspinous ligaments between C2, C3, and C4 (series 4, image 9), and to a lesser extent at C1-C2. Possible enlargement/edema of the interspinous ligament at C3-C4, possibly extending into the ligamentum flavum, contributing to the aforementioned spinal canal stenosis at C3-C4 (series 4, image 9). Disc levels: C2-C3: No significant disc bulge. Ligamentum flavum hypertrophy. Moderate spinal canal stenosis at the disc level, with moderate to severe spinal canal stenosis just below the disc level, secondary to a small collection along the left greater than right aspect of the spinal canal (series 6, image 17). No neural foraminal narrowing. C3-C4: Mild disc bulge. Ligamentum flavum hypertrophy, with a small fluid collection along the posterior aspect of the thecal sac and likely ligamentum flavum, which causes severe spinal canal stenosis and compression. The AP diameter of the spinal cord at this level is approximately 3 mm (series 6, image 19). Mild bilateral neural foraminal narrowing. C4-C5: Mild disc bulge  with superimposed right central to subarticular protrusion, which indents and deforms the right greater than left aspect of the spinal cord. Severe spinal canal stenosis. Ligamentum flavum hypertrophy.  Mild left and moderate right neural foraminal narrowing. C5-C6: Mild disc bulge. Facet and uncovertebral hypertrophy. Mild-to-moderate spinal canal stenosis. Mild-to-moderate bilateral neural foraminal narrowing. C6-C7: Mild disc bulge. Facet and uncovertebral hypertrophy. No spinal canal stenosis. No neural foraminal narrowing. C7-T1: No significant disc bulge. Facet and uncovertebral hypertrophy. Mild bilateral neural foraminal narrowing. IMPRESSION: 1. Severe spinal canal stenosis at C3-C4 and C4-C5, with cord compression at C3-C4 and increased T2 signal in the spinal cord just inferior to the C3-C4 disc level and in the left aspect of the cord at C4-C5, concerning for cord edema. 2. T2 hyperintense material along the posterior aspect of the thecal sac from C2-C3 to C4-C5, which contributes to the spinal canal stenosis at C3-C4 and C4-C5. This collection is of indeterminate etiology, and may represent a small hematoma, although a small epidural abscess is not entirely excluded. 3. Increased T2 signal in the interspinous ligaments between C2-C4, concerning for ligamentous injury. 4. C5-C6 mild-to-moderate spinal canal stenosis and mild-to-moderate bilateral neural foraminal narrowing. These findings were discussed by telephone on 07/26/2022 at 8:46 pm with provider Dr. Lorrin Goodell. Electronically Signed   By: Merilyn Baba M.D.   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 Source of infection unknown, no risk factors of IVDU.  - continue on current regimen. - will follow or cultures to narrow spectrum of abtx coverage - will check sed rate and crp - if  blood cx remain negative on 2/18, then can place picc line - anticipate 6-8 wk of iv abtx
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
## 2022-07-28 NOTE — Anesthesia Postprocedure Evaluation (Signed)
Anesthesia Post Note  Patient: Kristen Bautista  Procedure(s) Performed: POSTERIOR CERVICAL DECOMPRESSION, INSTRUMENTATION AND FUSION CERVICAL THREE-CERVICAL SIX (Neck)     Patient location during evaluation: PACU Anesthesia Type: General Level of consciousness: sedated and patient cooperative Pain management: pain level controlled Vital Signs Assessment: post-procedure vital signs reviewed and stable Respiratory status: spontaneous breathing Cardiovascular status: stable Anesthetic complications: no   No notable events documented.  Last Vitals:  Vitals:   07/28/22 1300 07/28/22 1406  BP: (!) 178/85 (!) 169/85  Pulse: 65 65  Resp: 17   Temp: 36.4 C   SpO2: 92% 95%    Last Pain:  Vitals:   07/28/22 1300  TempSrc: Oral  PainSc:                  [name]

## 2022-07-28 NOTE — Anesthesia Procedure Notes (Signed)
Procedure Name: Intubation Date/Time: 07/28/2022 8:02 AM  Performed by: Wilburn Cornelia, CRNAPre-anesthesia Checklist: Patient identified, Emergency Drugs available, Patient being monitored, Suction available and Timeout performed Patient Re-evaluated:Patient Re-evaluated prior to induction Oxygen Delivery Method: Circle system utilized Preoxygenation: Pre-oxygenation with 100% oxygen Induction Type: IV induction Ventilation: Mask ventilation without difficulty Grade View: Grade I Tube type: Oral Tube size: 7.0 mm Number of attempts: 1 Airway Equipment and Method: Video-laryngoscopy and Rigid stylet Placement Confirmation: ETT inserted through vocal cords under direct vision, positive ETCO2, CO2 detector and breath sounds checked- equal and bilateral Secured at: 21 cm Tube secured with: Tape Dental Injury: Teeth and Oropharynx as per pre-operative assessment

## 2022-07-28 NOTE — Anesthesia Procedure Notes (Signed)
Arterial Line Insertion Start/End2/17/2024 8:05 AM, 07/28/2022 8:10 AM Performed by: Kyung Rudd, CRNA, CRNA  Preanesthetic checklist: patient identified, IV checked, site marked, risks and benefits discussed, surgical consent, monitors and equipment checked and pre-op evaluation Patient sedated Left, radial was placed Catheter size: 20 G Hand hygiene performed , maximum sterile barriers used  and Seldinger technique used Allen's test indicative of satisfactory collateral circulation Attempts: 1 Procedure performed without using ultrasound guided technique. Following insertion, Biopatch and dressing applied. Post procedure assessment: normal  Patient tolerated the procedure well with no immediate complications.

## 2022-07-28 NOTE — Op Note (Signed)
Providing Compassionate, Quality Care - Together  Date of service: 07/28/2022   PREOP DIAGNOSIS:  C3-6 severe cervical stenosis with myelopathy, concern for epidural abscess  POSTOP DIAGNOSIS: 1.  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 Pieter Partridge [name], DO  ASSISTANT: Dr. Ashok Pall, MD  ANESTHESIA: General Endotracheal  EBL: 50 cc  SPECIMENS: C3-4 epidural abscess culture  DRAINS: None  COMPLICATIONS: None  CONDITION: Hemodynamically stable  HISTORY: Kristen Bautista is a 67 y.o. female with progressively worsening upper and lower extremity weakness, difficulty walking and dysesthetic changes in her hands and feet.  MRI of the cervical spine revealed severe stenosis with cord signal change at C3-6 due to spondylitic changes as well as concern for epidural abscess at C3-4.  Contrasted image confirmed likelihood of epidural abscess.  Therefore I recommended surgical intervention in the form of a posterior cervical decompression, instrumentation and fusion at C3-6.  All risks, benefits and expected outcomes were discussed and agreed upon.  Informed consent was obtained and witnessed.  PROCEDURE IN DETAIL: The patient was brought to the operating room. After induction of general anesthesia, her head was placed in a Sugita head holder, the patient was positioned on the operative table in the prone position.  All pressure points were meticulously padded. Skin incision was then marked out and prepped and draped in the usual sterile fashion. Physician driven timeout was performed.  Using 10 blade, midline incision was made over the C3-6 spinous processes.  Using Bovie electrocautery, midline dissection was performed down to the dorsal fascia.  Bilateral subperiosteal dissection was performed with Bovie electrocautery to expose the C3 lamina, C3-4 facet, C4, C5, C6 lamina, C4-5, C5-6 facets.  Lateral fluoroscopy confirmed the appropriate level.  The microscope was sterilely draped and brought into the field for microdissection.  Bilateral troughs at the lamina facet junction were performed with a high-speed drill down to the epidural space and ligamentum flavum.  Upon performing this at C3-4, there was spontaneous evacuation of purulent fluid which was cultured.  The ligamentum was disconnected with Kerrison rongeurs and microcurette's bilaterally.  The lamina and spinous processes were gently elevated and removed.  There was significant adherence of the ligamentum flavum to the dura at all levels.  Remaining ligamentum in the lateral gutters was removed with Kerrison rongeur's.  Epidural hemostasis was achieved with Surgifoam.  The thecal sac was noted to be pulsatile.  I then placed bilateral pilot holes in the lateral mass of C3, C4, C5, C6.  Using a 3.0 mm tap, a superior lateral trajectory was performed bilaterally to a depth of approximately 12 mm.  Using ball-tipped probe, there were bony borders in all directions and a bony floor.  The above listed lateral mass screws were placed with appropriate bony purchase.  Lateral fluoroscopy confirmed appropriate placement.  I then measured, selected and contoured appropriate size rods and placed bilaterally.  Setscrews were placed and final tightened to the manufacturer's recommendation.  Using a high-speed drill,  the C3-4, C4-5, C5-6 facets joints were decorticated.   The autograft was saved and placed in the lateral gutters.  Allograft was then also placed in the lateral gutters bilaterally.  Deep retractors were taken out of the wound.  Soft tissue hemostasis was achieved with monopolar cautery.  The wound was copiously irrigated and noted to be excellently hemostatic.  Vancomycin powder was placed in the epidural space, the dura was then covered with Gelfoam.  The wound was then closed in layers, muscle and fascia with 0 Vicryl suture, dermis with 2-0 and 3-0 Vicryl suture.  Skin was closed with skin glue.  At the end of the case all sponge, needle, and instrument counts were correct. The patient was then transferred to the stretcher, Sugita head holder was removed, extubated, and taken to the post-anesthesia care unit in stable hemodynamic condition.

## 2022-07-28 NOTE — Progress Notes (Signed)
   Providing Compassionate, Quality Care - Together  NEUROSURGERY PROGRESS NOTE   S: No issues overnight.   O: EXAM:  BP (!) 147/85 (BP Location: Right Arm)   Pulse 60   Temp 98.2 F (36.8 C) (Oral)   Resp 18   Ht 5' 7"$  (1.702 m)   Wt 58.6 kg   SpO2 96%   BMI 20.23 kg/m   Awake alert Oriented x 3, no acute distress, PERRLA Cranial nerves II through XII intact Bilateral upper/lower extremity 4+/5 throughout Brisk Hoffmann's bilateral upper extremities Deep tendon reflexes 3/4 in the uppers and lowers bilaterally No clonus Decreased sensation to light touch in the bilateral hands and feet   Impression/Assessment:  67 year old female with   C3-6 severe cervical stenosis with myelopathy Concern for epidural abscess   Plan:  -informed consent obtained and witnessed -OR today for PCDF C3-6    Thank you for allowing me to participate in this patient's care.  Please do not hesitate to call with questions or concerns.   Elwin Sleight, [facility]

## 2022-07-28 NOTE — Progress Notes (Signed)
Orthopedic Tech Progress Note Patient Details:  Kristen Bautista 1956-02-03 [id number]  Ortho Devices Type of Ortho Device: Aspen cervical collar Ortho Device/Splint Location: Handed to PACU RN Kat Ortho Device/Splint Interventions: Ordered   Post Interventions Patient Tolerated: Well Instructions Provided: Care of device, Adjustment of device  [name] Jeri Modena 07/28/2022, 11:24 AM

## 2022-07-28 NOTE — Transfer of Care (Signed)
Immediate Anesthesia Transfer of Care Note  Patient: Kristen Bautista  Procedure(s) Performed: POSTERIOR CERVICAL DECOMPRESSION, INSTRUMENTATION AND FUSION CERVICAL THREE-CERVICAL SIX (Neck)  Patient Location: PACU  Anesthesia Type:General  Level of Consciousness: awake and alert   Airway & Oxygen Therapy: Patient Spontanous Breathing and Patient connected to nasal cannula oxygen  Post-op Assessment: Report given to RN, Post -op Vital signs reviewed and stable, and Patient moving all extremities X 4  Post vital signs: Reviewed and stable  Last Vitals:  Vitals Value Taken Time  BP 150/78 07/28/22 1024  Temp    Pulse 71 07/28/22 1026  Resp 15 07/28/22 1026  SpO2 100 % 07/28/22 1026  Vitals shown include unvalidated device data.  Last Pain:  Vitals:   07/28/22 0736  TempSrc:   PainSc: 0-No pain      Patients Stated Pain Goal: 0 ([id number])  Complications: No notable events documented.

## 2022-07-29 ENCOUNTER — Encounter (HOSPITAL_COMMUNITY): Payer: Self-pay | Admitting: Internal Medicine

## 2022-07-29 ENCOUNTER — Other Ambulatory Visit: Payer: Self-pay

## 2022-07-29 DIAGNOSIS — G959 Disease of spinal cord, unspecified: Secondary | ICD-10-CM | POA: Diagnosis not present

## 2022-07-29 LAB — COMPREHENSIVE METABOLIC PANEL
ALT: 22 U/L (ref 0–44)
AST: 44 U/L — ABNORMAL HIGH (ref 15–41)
Albumin: 3.1 g/dL — ABNORMAL LOW (ref 3.5–5.0)
Alkaline Phosphatase: 54 U/L (ref 38–126)
Anion gap: 13 (ref 5–15)
BUN: 15 mg/dL (ref 8–23)
CO2: 23 mmol/L (ref 22–32)
Calcium: 9.4 mg/dL (ref 8.9–10.3)
Chloride: 102 mmol/L (ref 98–111)
Creatinine, Ser: 1.12 mg/dL — ABNORMAL HIGH (ref 0.44–1.00)
GFR, Estimated: 54 mL/min — ABNORMAL LOW (ref >60)
Glucose, Bld: 109 mg/dL — ABNORMAL HIGH (ref 70–99)
Potassium: 4.3 mmol/L (ref 3.5–5.1)
Sodium: 138 mmol/L (ref 135–145)
Total Bilirubin: 0.7 mg/dL (ref 0.3–1.2)
Total Protein: 7.9 g/dL (ref 6.5–8.1)

## 2022-07-29 LAB — MAGNESIUM: Magnesium: 1.7 mg/dL (ref 1.7–2.4)

## 2022-07-29 LAB — CBC
HCT: 37.2 % (ref 36.0–46.0)
Hemoglobin: 12.6 g/dL (ref 12.0–15.0)
MCH: 29.2 pg (ref 26.0–34.0)
MCHC: 33.9 g/dL (ref 30.0–36.0)
MCV: 86.3 fL (ref 80.0–100.0)
Platelets: 378 10*3/uL (ref 150–400)
RBC: 4.31 MIL/uL (ref 3.87–5.11)
RDW: 12 % (ref 11.5–15.5)
WBC: 10.5 10*3/uL (ref 4.0–10.5)
nRBC: 0 % (ref 0.0–0.2)

## 2022-07-29 LAB — VANCOMYCIN, RANDOM: Vancomycin Rm: 33 ug/mL

## 2022-07-29 LAB — VANCOMYCIN, TROUGH: Vancomycin Tr: 21 ug/mL (ref 15–20)

## 2022-07-29 MED ORDER — VANCOMYCIN HCL 500 MG/100ML IV SOLN: 500.0000 mg | Freq: Two times a day (BID) | INTRAVENOUS | Status: DC

## 2022-07-29 MED ORDER — SODIUM CHLORIDE 0.9 % IV SOLN
2.0000 g | Freq: Two times a day (BID) | INTRAVENOUS | Status: DC
  Administered 2022-07-29 – 2022-07-31 (×4): 2 g via INTRAVENOUS
  Filled 2022-07-29 (×4): qty 20

## 2022-07-29 MED ORDER — VANCOMYCIN HCL 500 MG/100ML IV SOLN
500.0000 mg | Freq: Two times a day (BID) | INTRAVENOUS | Status: DC
  Administered 2022-07-29 – 2022-08-01 (×6): 500 mg via INTRAVENOUS
  Filled 2022-07-29 (×8): qty 100

## 2022-07-29 NOTE — Progress Notes (Signed)
Pharmacy Antibiotic Note  Kristen Bautista is a 67 y.o. female admitted on 07/26/2022 with concern for epidural abscess.  Pharmacy has been consulted for vancomycin and Zosyn dosing.  ID following   Vanc trough at 21. Cultures negative so far. Will delay for a few hours and decrease dose for goal 15-20. Discussed with ID, will change piperacillin/tazobactam to ceftriaxone   Plan: Decrease vancomycin to 569m Q12h Stop Piperacillin/tazobactam  Start ceftriaxone 2g q12hr  Monitor cultures, clinical status, renal function, vancomycin level Narrow abx as able and f/u duration    Height: 5' 7"$  (170.2 cm) Weight: 58.6 kg (129 lb 3 oz) IBW/kg (Calculated) : 61.6  Temp (24hrs), Avg:98 F (36.7 C), Min:97.7 F (36.5 C), Max:98.5 F (36.9 C)  Recent Labs  Lab 07/26/22 1811 07/27/22 0029 07/28/22 0243 07/29/22 0454 07/29/22 1059  WBC  --  6.3 5.8 10.5  --   CREATININE 0.84  --  1.02* 1.12*  --   VANCOTROUGH  --   --   --   --  21*  VANCORANDOM  --   --   --  33  --     Estimated Creatinine Clearance: 45.7 mL/min (A) (by C-G formula based on SCr of 1.12 mg/dL (H)).    No Known Allergies  Antimicrobials this admission: Vanc 2/16 >> Zosyn 2/16 >>2/18 CTX 2/18 >>   Dose adjustments this admission: 2/18 VP 33, VT 21, AUC = 727; decr to 5052mQ12hr  Microbiology results: 2/16 MRSA PCR - negative 2/16 BCx - ngtd 2/17 cervical tissue ngtd  Thank you for allowing pharmacy to be a part of this patient's care.  LyBenetta SparPharmD, BCPS, BCCP Clinical Pharmacist  Please check AMION for all [facility] numbers After 10:00 PM, call [phone]

## 2022-07-29 NOTE — Plan of Care (Signed)

## 2022-07-29 NOTE — Evaluation (Signed)
Physical Therapy Evaluation Patient Details Name: Kristen Bautista MRN: [id number] DOB: [birth year] Today's Date: 07/29/2022  History of Present Illness  67 year old African-American female without significant medical history presents to the ER 07/26/22 with about a week to 2-week history of worsening neck pain, bilateral arm paresthesias and intermittent paresthesias of her feet. 2-3 days PTA with difficulty standing. 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 2/17 C3-6 PCDF.  Clinical Impression   Pt admitted secondary to problem above with deficits below. PTA patient was living alone in one level townhouse with 1+1 steps to enter.  Pt currently requires 2 person assist for transfers and gait due to ataxic pattern. Patient reports her female cousin can come and stay with her 24/7.  Anticipate patient will benefit from PT to address problems listed below.Will continue to follow acutely to maximize functional mobility independence and safety.          Recommendations for follow up therapy are one component of a multi-disciplinary discharge planning process, led by the attending physician.  Recommendations may be updated based on patient status, additional functional criteria and insurance authorization.  Follow Up Recommendations Acute inpatient rehab (3hours/day)      Assistance Recommended at Discharge Frequent or constant Supervision/Assistance  Patient can return home with the following  A little help with walking and/or transfers;A little help with bathing/dressing/bathroom;Assistance with cooking/housework;Assist for transportation;Help with stairs or ramp for entrance    Equipment Recommendations Rolling walker (2 wheels)  Recommendations for Other Services  Rehab consult    Functional Status Assessment Patient has had a recent decline in their functional status and demonstrates the ability to make significant improvements in function in a reasonable and predictable amount of time.      Precautions / Restrictions Precautions Precautions: Cervical Precaution Booklet Issued: No Precaution Comments: ok for out of brace in bed, for showering, and for short trips to bathroom Required Braces or Orthoses: Cervical Brace Cervical Brace: Hard collar;Other (comment) (when OOB)      Mobility  Bed Mobility               General bed mobility comments: up in chair    Transfers Overall transfer level: Needs assistance Equipment used: Rolling walker (2 wheels) Transfers: Sit to/from Stand Sit to Stand: Min assist, +2 physical assistance           General transfer comment: pt anxious re: pain and required physical assist to initiate; assist to control descent as she could not feel her hands on the armrests    Ambulation/Gait Ambulation/Gait assistance: Mod assist, +2 physical assistance, +2 safety/equipment Gait Distance (Feet): 11 Feet Assistive device: Rolling walker (2 wheels) Gait Pattern/deviations: Step-through pattern, Steppage, Ataxic, Scissoring, Narrow base of support   Gait velocity interpretation: <1.8 ft/sec, indicate of risk for recurrent falls   General Gait Details: pt with numbness bil feet that increases when she stood up; 2 person assist due to inability to direct legs in normal gait pattern (steadying assist and controlling RW); chair brought to pt due to fatigue  Stairs            Wheelchair Mobility    Modified Rankin (Stroke Patients Only)       Balance Overall balance assessment: Needs assistance Sitting-balance support: No upper extremity supported, Feet supported Sitting balance-Leahy Scale: Fair     Standing balance support: Bilateral upper extremity supported, Reliant on assistive device for balance Standing balance-Leahy Scale: Poor  Pertinent Vitals/Pain Pain Assessment Pain Assessment: 0-10 Pain Score: 10-Worst pain ever Pain Location: neck Pain Descriptors /  Indicators: Operative site guarding Pain Intervention(s): Limited activity within patient's tolerance, Monitored during session    Home Living Family/patient expects to be discharged to:: Private residence Living Arrangements: Alone Available Help at Discharge: Family;Available 24 hours/day (cousin) Type of Home: House (townhouse) Home Access: Stairs to enter Entrance Stairs-Rails: Psychiatric nurse of Steps: 1+1   Home Layout: One level Home Equipment: Hand held shower head      Prior Function Prior Level of Function : Independent/Modified Independent               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, newspaper   Dominant Hand: Left    Extremity/Trunk Assessment   Upper Extremity Assessment Upper Extremity Assessment: Defer to OT evaluation    Lower Extremity Assessment Lower Extremity Assessment: RLE deficits/detail;LLE deficits/detail RLE Deficits / Details: strength WFL; ataxic gait pattern with steppage gait, near-scissoring RLE Sensation: decreased light touch;decreased proprioception LLE Deficits / Details: strength WFL; ataxic gait pattern with steppage gait, near-scissoring LLE Sensation: decreased light touch;decreased proprioception    Cervical / Trunk Assessment Cervical / Trunk Assessment: Neck Surgery  Communication   Communication: No difficulties  Cognition Arousal/Alertness: Awake/alert Behavior During Therapy: Anxious (about pain; does not want to learn how to don/doff brace as she does not want to take it off) Overall Cognitive Status: Within Functional Limits for tasks assessed                                          General Comments      Exercises     Assessment/Plan    PT Assessment Patient needs continued PT services  PT Problem List Decreased activity tolerance;Decreased balance;Decreased mobility;Decreased coordination;Decreased knowledge of use of DME;Decreased safety  awareness;Decreased knowledge of precautions;Impaired sensation;Pain       PT Treatment Interventions DME instruction;Gait training;Stair training;Functional mobility training;Therapeutic activities;Balance training;Neuromuscular re-education;Patient/family education    PT Goals (Current goals can be found in the Care Plan section)  Acute Rehab PT Goals Patient Stated Goal: return home and return to work PT Goal Formulation: With patient Time For Goal Achievement: 08/12/22 Potential to Achieve Goals: Good    Frequency Min 5X/week     Co-evaluation PT/OT/SLP Co-Evaluation/Treatment: Yes Reason for Co-Treatment: Complexity of the patient's impairments (multi-system involvement);For patient/therapist safety PT goals addressed during session: Mobility/safety with mobility;Balance;Proper use of DME         AM-PAC PT "6 Clicks" Mobility  Outcome Measure Help needed turning from your back to your side while in a flat bed without using bedrails?: Total Help needed moving from lying on your back to sitting on the side of a flat bed without using bedrails?: Total Help needed moving to and from a bed to a chair (including a wheelchair)?: Total Help needed standing up from a chair using your arms (e.g., wheelchair or bedside chair)?: Total Help needed to walk in hospital room?: Total Help needed climbing 3-5 steps with a railing? : Total 6 Click Score: 6    End of Session Equipment Utilized During Treatment: Gait belt;Cervical collar Activity Tolerance: Patient limited by fatigue;Patient limited by pain Patient left: in chair;with call bell/phone within reach (up in chair by nursing and no chair alarm utilized) Nurse Communication: Mobility status PT Visit Diagnosis: Unsteadiness on feet (  R26.81);Muscle weakness (generalized) (M62.81);Ataxic gait (R26.0);Other symptoms and signs involving the nervous system (R29.898)    Time: [id number] PT Time Calculation (min) (ACUTE ONLY): 25  min   Charges:   PT Evaluation $PT Eval Low Complexity: [place], PT Acute Rehabilitation Services  Office [phone]   Rexanne Mano 07/29/2022, 12:25 PM

## 2022-07-29 NOTE — Progress Notes (Addendum)
[facility] for Infectious Disease    Date of Admission:  07/26/2022   Total days of antibiotics 3   ID: Kristen Bautista is a 67 y.o. female with  cervical epidural abscess  Principal Problem:   Cervical myelopathy ([place]) Active Problems:   DNR (do not resuscitate)/DNI(Do Not Intubate)   Malnutrition of moderate degree    Subjective: Mild neck pain still having paresthesia to hands and feet  Medications:   amLODipine  5 mg Oral Daily   docusate sodium  100 mg Oral BID   sodium chloride flush  3 mL Intravenous Q12H    Objective: Vital signs in last 24 hours: Temp:  [97.7 F (36.5 C)-98.5 F (36.9 C)] 98 F (36.7 C) (02/18 0847) Pulse Rate:  [58-73] 73 (02/18 0847) Resp:  [16-17] 16 (02/18 0332) BP: (136-169)/(70-85) 140/73 (02/18 0919) SpO2:  [94 %-97 %] 94 % (02/18 0847) Physical Exam  Constitutional:  oriented to person, place, and time. appears well-developed and well-nourished. No distress.  HENT: [place]/AT, PERRLA, no scleral icterus Mouth/Throat: Oropharynx is clear and moist. No oropharyngeal exudate. Aspen collar+ Cardiovascular: Normal rate, regular rhythm and normal heart sounds. Exam reveals no gallop and no friction rub.  No murmur heard.  Pulmonary/Chest: Effort normal and breath sounds normal. No respiratory distress.  has no wheezes.  Abdominal: Soft. Bowel sounds are normal.  exhibits no distension. There is no tenderness.  Lymphadenopathy: no cervical adenopathy. No axillary adenopathy Neurological: alert and oriented to person, place, and time. Moving all extremities Skin: Skin is warm and dry. No rash noted. No erythema.  Psychiatric: a normal mood and affect.  behavior is normal.    Lab Results Recent Labs    07/28/22 0243 07/29/22 0454  WBC 5.8 10.5  HGB 10.9* 12.6  HCT 31.9* 37.2  NA 137 138  K 3.7 4.3  CL 103 102  CO2 25 23  BUN 18 15  CREATININE 1.02* 1.12*   Liver Panel Recent Labs    07/28/22 0243 07/29/22 0454  PROT 7.1 7.9   ALBUMIN 2.7* 3.1*  AST 38 44*  ALT 20 22  ALKPHOS 53 54  BILITOT 0.5 0.7   No results found for: "ESRSEDRATE", "POCTSEDRATE"  Microbiology: reviewed Studies/Results: DG Cervical Spine 2 or 3 views  Result Date: 07/28/2022 CLINICAL DATA:  Portable operative imaging provided for cervical spine fusion. EXAM: CERVICAL SPINE - 2-3 VIEW; DG C-ARM 1-60 MIN-NO REPORT Fluoro time, 4.4 seconds.  Dose: 0.25 mGy. COMPARISON:  None Available. FINDINGS: Two submitted images show placement of posterior fusion hardware at C3, C4, C5 and C6. IMPRESSION: 1. Imaging provided for cervical spine fusion. Please refer to the operative report for further details. Electronically Signed   By: Lajean Manes M.D.   On: 07/28/2022 10:08   DG C-Arm 1-60 Min-No Report  Result Date: 07/28/2022 Fluoroscopy was utilized by the requesting physician.  No radiographic interpretation.   DG C-Arm 1-60 Min-No Report  Result Date: 07/28/2022 Fluoroscopy was utilized by the requesting physician.  No radiographic interpretation.     Assessment/Plan: Epidural abscess = cx ngtd at day 1. Plan to change abtx to vancomycin and ceftriaxone 2gm IV daily. Can place picc line tomorrow  Aki = will d/c piptazo to minimize aki  Paresthesia = appears similar to when she presented to hospital. Will continue to monitor  Neck pain =defer to primary team for management  I have personally spent 50 minutes involved in face-to-face and non-face-to-face activities for this patient on  the day of the visit. Professional time spent includes the following activities: Preparing to see the patient (review of tests), Obtaining and/or reviewing separately obtained history (admission/discharge record), Performing a medically appropriate examination and/or evaluation , Ordering medications/tests/procedures, referring and communicating with other health care professionals, Documenting clinical information in the EMR, Independently interpreting results  (not separately reported), Communicating results to the patient/family/caregiver, Counseling and educating the patient/family/caregiver and Care coordination (not separately reported).     [facility] for Infectious Diseases Pager: [phone]  07/29/2022, 1:34 PM

## 2022-07-29 NOTE — Progress Notes (Signed)
Patient ID: Kristen Bautista, female   DOB: 07/13/1955, 67 y.o.   MRN: [id number] BP (!) 140/73   Pulse 73   Temp 98 F (36.7 C) (Oral)   Resp 16   Ht 5' 7"$  (1.702 m)   Wt 58.6 kg   SpO2 94%   BMI 20.23 kg/m  Alert and oriented x 4 Speech is clear and fluent Moving all extremities, still feels pins and needles sensation. To be expected at this time Wound is clean,dry, no signs of infection.
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
## 2022-07-29 NOTE — Progress Notes (Signed)
Inpatient Rehab Admissions Coordinator:  ? ?Per therapy recommendations,  patient was screened for CIR candidacy by Dalasia Predmore, MS, CCC-SLP. At this time, Pt. Appears to be a a potential candidate for CIR. I will place   order for rehab consult per protocol for full assessment. Please contact me any with questions. ? ?Petrona Wyeth, MS, CCC-SLP ?Rehab Admissions Coordinator  ?[phone] (celll) ?[phone] (office) ? ?

## 2022-07-29 NOTE — Evaluation (Signed)
Occupational Therapy Evaluation Patient Details Name: Kristen Bautista MRN: [id number] DOB: December 02, 1955 Today's Date: 07/29/2022   History of Present Illness 67 year old F without significant medical history presents to the ER 07/26/22 with about a week to 2-week history of worsening neck pain, bilateral arm paresthesias and intermittent paresthesias of her feet. 2-3 days PTA with difficulty standing. 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 2/17 C3-6 PCDF.   Clinical Impression   Pt is typically independent in ADL and mobility without DME. 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  Today she is in no pain when seated, and jumps to a 10 in standing. She also reports that her sensation decreases in standing and her arms become more numb in standing. Pt is overall mod A for UB ADL, max A for LB ADL, transfers at min A +2 and will benefit from skilled OT in the acute setting as well as afterwards at the AIR level to maximize safety and independence in ADL and functional transfers. OT will continue to monitor function and sensation in BUE post-op as swelling decreases.      Recommendations for follow up therapy are one component of a multi-disciplinary discharge planning process, led by the attending physician.  Recommendations may be updated based on patient status, additional functional criteria and insurance authorization.   Follow Up Recommendations  Acute inpatient rehab (3hours/day)     Assistance Recommended at Discharge Frequent or constant Supervision/Assistance  Patient can return home with the following A lot of help with walking and/or transfers;A lot of help with bathing/dressing/bathroom;Assistance with cooking/housework;Assistance with feeding;Assist for transportation;Help with stairs or ramp for entrance    Functional Status Assessment  Patient has had a recent decline in their functional status and demonstrates the ability to make significant improvements in  function in a reasonable and predictable amount of time.  Equipment Recommendations  BSC/3in1;Tub/shower bench (TBA)    Recommendations for Other Services Rehab consult;PT consult     Precautions / Restrictions Precautions Precautions: Cervical Precaution Booklet Issued: No Precaution Comments: ok for out of brace in bed, for showering, and for short trips to bathroom Required Braces or Orthoses: Cervical Brace Cervical Brace: Hard collar;Other (comment) (when OOB) Restrictions Weight Bearing Restrictions: No      Mobility Bed Mobility               General bed mobility comments: up in chair    Transfers Overall transfer level: Needs assistance Equipment used: Rolling walker (2 wheels) Transfers: Sit to/from Stand Sit to Stand: Min assist, +2 physical assistance           General transfer comment: pt anxious re: pain and required physical assist to initiate; assist to control descent as she could not feel her hands on the armrests      Balance Overall balance assessment: Needs assistance Sitting-balance support: No upper extremity supported, Feet supported Sitting balance-Leahy Scale: Fair     Standing balance support: Bilateral upper extremity supported, Reliant on assistive device for balance Standing balance-Leahy Scale: Poor                             ADL either performed or assessed with clinical judgement   ADL Overall ADL's : Needs assistance/impaired Eating/Feeding: Minimal assistance;Sitting Eating/Feeding Details (indicate cue type and reason): will need to continue to be assessed for potential adaoptive tools Grooming: Minimal assistance;Sitting;Wash/dry hands Grooming Details (indicate cue type and reason): increased pain/decreased sensation  Upper Body Bathing: Moderate assistance;Sitting   Lower Body Bathing: Maximal assistance;Sitting/lateral leans   Upper Body Dressing : Minimal assistance;Sitting Upper Body Dressing Details  (indicate cue type and reason): to don extra gown like robe Lower Body Dressing: Maximal assistance   Toilet Transfer: Minimal assistance;+2 for physical assistance;+2 for safety/equipment;Ambulation;Rolling walker (2 wheels) Toilet Transfer Details (indicate cue type and reason): ataxic gait, struggles to maintain grasp on walker handle on Right Toileting- Clothing Manipulation and Hygiene: Moderate assistance;Sit to/from stand       Functional mobility during ADLs: Minimal assistance;+2 for physical assistance;+2 for safety/equipment;Rolling walker (2 wheels)       Vision Ability to See in Adequate Light: 0 Adequate Patient Visual Report: No change from baseline Vision Assessment?: No apparent visual deficits     Perception     Praxis      Pertinent Vitals/Pain Pain Assessment Pain Assessment: 0-10 Pain Score: 10-Worst pain ever Pain Location: neck Pain Descriptors / Indicators: Operative site guarding Pain Intervention(s): Limited activity within patient's tolerance, Monitored during session, Repositioned     Hand Dominance Left   Extremity/Trunk Assessment Upper Extremity Assessment Upper Extremity Assessment: RUE deficits/detail;LUE deficits/detail RUE Deficits / Details: overall 4/5 MMT, reports tingling when seated, and increased numbness with standing. Decreased proprioception, decreased control with pronation RUE Sensation: decreased light touch;decreased proprioception RUE Coordination: decreased fine motor;decreased gross motor LUE Deficits / Details: overall 4/5 MMT, reports tingling when seated, and increased numbness with standing. Decreased proprioception, LUE Sensation: decreased light touch;decreased proprioception LUE Coordination: decreased fine motor;decreased gross motor   Lower Extremity Assessment Lower Extremity Assessment: Defer to PT evaluation RLE Deficits / Details: strength WFL; ataxic gait pattern with steppage gait, near-scissoring RLE  Sensation: decreased light touch;decreased proprioception LLE Deficits / Details: strength WFL; ataxic gait pattern with steppage gait, near-scissoring LLE Sensation: decreased light touch;decreased proprioception   Cervical / Trunk Assessment Cervical / Trunk Assessment: Neck Surgery   Communication Communication Communication: No difficulties   Cognition Arousal/Alertness: Awake/alert Behavior During Therapy: Anxious (about pain; does not want to learn how to don/doff brace as she does not want to take it off) Overall Cognitive Status: Within Functional Limits for tasks assessed                                 General Comments: Pt very motivated, able to answer all background questions and while anxious about pain and neck seems overall WFL - continue to monitor     General Comments  VSS throughout session    Exercises     Shoulder Instructions      Home Living Family/patient expects to be discharged to:: Private residence Living Arrangements: Alone Available Help at Discharge: Family;Available 24 hours/day (cousin) Type of Home: House (townhouse) Home Access: Stairs to enter Technical brewer of Steps: 1+1 Entrance Stairs-Rails: Right;Left Home Layout: One level     Bathroom Shower/Tub: Corporate investment banker: Standard Bathroom Accessibility: Yes   Home Equipment: Hand held shower head   Additional Comments: 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[employer], does not drive      Prior Functioning/Environment Prior Level of Function : Independent/Modified Independent               ADLs Comments: 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)        OT Problem List: Decreased strength;Decreased range of motion;Impaired balance (sitting and/or standing);Decreased coordination;Decreased knowledge of use of DME or AE;Decreased knowledge  of precautions;Impaired sensation;Impaired UE functional use;Pain;Increased edema      OT  Treatment/Interventions: Self-care/ADL training;Therapeutic exercise;Neuromuscular education;Energy conservation;DME and/or AE instruction;Therapeutic activities;Patient/family education;Balance training    OT Goals(Current goals can be found in the care plan section) Acute Rehab OT Goals Patient Stated Goal: get back to independent and work OT Goal Formulation: With patient Time For Goal Achievement: 08/12/22 Potential to Achieve Goals: Good ADL Goals Pt Will Perform Eating: with modified independence;sitting Pt Will Perform Grooming: with modified independence;sitting Pt Will Perform Upper Body Dressing: with modified independence;sitting Pt Will Perform Lower Body Dressing: with supervision;sit to/from stand Pt Will Transfer to Toilet: with supervision;ambulating Pt Will Perform Toileting - Clothing Manipulation and hygiene: with supervision;sit to/from stand Additional ADL Goal #1: Pt will demonstrate don/doff of cervical brace at mod I level  OT Frequency: Min 2X/week    Co-evaluation PT/OT/SLP Co-Evaluation/Treatment: Yes Reason for Co-Treatment: Complexity of the patient's impairments (multi-system involvement);For patient/therapist safety PT goals addressed during session: Mobility/safety with mobility;Balance;Proper use of DME OT goals addressed during session: ADL's and self-care;Proper use of Adaptive equipment and DME;Strengthening/ROM      AM-PAC OT "6 Clicks" Daily Activity     Outcome Measure Help from another person eating meals?: A Little Help from another person taking care of personal grooming?: A Little Help from another person toileting, which includes using toliet, bedpan, or urinal?: A Lot Help from another person bathing (including washing, rinsing, drying)?: A Lot Help from another person to put on and taking off regular upper body clothing?: A Lot Help from another person to put on and taking off regular lower body clothing?: A Lot 6 Click Score: 14   End  of Session Equipment Utilized During Treatment: Gait belt;Rolling walker (2 wheels);Cervical collar Nurse Communication: Mobility status;Precautions  Activity Tolerance: Patient tolerated treatment well Patient left: in chair;with chair alarm set;with call bell/phone within reach  OT Visit Diagnosis: Unsteadiness on feet (R26.81);Other abnormalities of gait and mobility (R26.89);Muscle weakness (generalized) (M62.81);Feeding difficulties (R63.3);Other symptoms and signs involving the nervous system (R29.898)                Time: [id number] OT Time Calculation (min): 24 min Charges:  OT General Charges $OT Visit: 1 Visit OT Evaluation $OT Eval Moderate Complexity: [place] OTR/L Acute Rehabilitation Services Office: [place] 07/29/2022, 3:28 PM

## 2022-07-30 ENCOUNTER — Encounter (HOSPITAL_COMMUNITY): Payer: Self-pay | Admitting: Neurological Surgery

## 2022-07-30 ENCOUNTER — Inpatient Hospital Stay: Payer: Self-pay

## 2022-07-30 DIAGNOSIS — M4802 Spinal stenosis, cervical region: Secondary | ICD-10-CM

## 2022-07-30 DIAGNOSIS — M792 Neuralgia and neuritis, unspecified: Secondary | ICD-10-CM | POA: Diagnosis not present

## 2022-07-30 DIAGNOSIS — G062 Extradural and subdural abscess, unspecified: Secondary | ICD-10-CM | POA: Diagnosis not present

## 2022-07-30 DIAGNOSIS — N179 Acute kidney failure, unspecified: Secondary | ICD-10-CM

## 2022-07-30 DIAGNOSIS — G959 Disease of spinal cord, unspecified: Secondary | ICD-10-CM | POA: Diagnosis not present

## 2022-07-30 MED ORDER — CHLORHEXIDINE GLUCONATE CLOTH 2 % EX PADS
6.0000 | MEDICATED_PAD | Freq: Every day | CUTANEOUS | Status: DC
  Administered 2022-07-31 – 2022-08-01 (×2): 6 via TOPICAL

## 2022-07-30 MED ORDER — SODIUM CHLORIDE 0.9% FLUSH: 10.0000 mL | INTRAVENOUS | Status: DC | PRN

## 2022-07-30 MED ORDER — SODIUM CHLORIDE 0.9% FLUSH
10.0000 mL | Freq: Two times a day (BID) | INTRAVENOUS | Status: DC
  Administered 2022-07-30: 20 mL
  Administered 2022-07-31 – 2022-08-01 (×2): 10 mL

## 2022-07-30 MED FILL — Thrombin For Soln 5000 Unit: CUTANEOUS | Qty: 5000 | Status: AC

## 2022-07-30 NOTE — Progress Notes (Signed)
Inpatient Rehab Admissions Coordinator:   Attempted to reach pt by phone to discuss CIR recommendations but no answer.  Will continue efforts.   Shann Medal, PT, DPT Admissions Coordinator [phone] 07/30/22  1:14 PM

## 2022-07-30 NOTE — TOC Progression Note (Signed)
Transition of Care [facility]) - Progression Note    Patient Details  Name: Kristen Bautista MRN: [id number] Date of Birth: June 30, 1955  [facility]) CM/SW Contact  Loletha Grayer Beverely Pace, RN Phone Number: 07/30/2022, 11:09 AM  Clinical Narrative:    Patient is 67 yr old female s/p C3-6 Cervical decompression and instrumentation on 07/27/22. To get PICC for IV antibiotics. Case Manager contacted Carolynn Sayers with [employer] and requested she follow for possible home IV antibiotics. CIR following.   Expected Discharge Plan:  (To be determined) Barriers to Discharge: Continued Medical Work up  Expected Discharge Plan and Services       Living arrangements for the past 2 months: Apartment                                       Social Determinants of Health (SDOH) Interventions SDOH Screenings   Food Insecurity: No Food Insecurity (07/29/2022)  Housing: Low Risk  (07/29/2022)  Transportation Needs: No Transportation Needs (07/29/2022)  Utilities: Not At Risk (07/29/2022)  Tobacco Use: High Risk (07/29/2022)    Readmission Risk Interventions     No data to display

## 2022-07-30 NOTE — Progress Notes (Signed)
[place] for Infectious Disease  Date of Admission:  07/26/2022           Reason for visit: Follow up on epidural abscess  Current antibiotics: Vancomycin Ceftriaxone  ASSESSMENT:    67 y.o. female admitted with:  Cervical stenosis, myelopathy: Status post OR 07/28/2022 with neurosurgery with C3-6 posterior cervical decompression, instrumented fusion.  Concern intraoperatively for epidural abscess and operative cultures no growth x 2 days. Acute kidney injury: Creatinine improved.  RECOMMENDATIONS:    Continue vancomycin and ceftriaxone Place PICC line Monitor OR cultures Following   Principal Problem:   Cervical myelopathy (HCC) Active Problems:   DNR (do not resuscitate)/DNI(Do Not Intubate)   Malnutrition of moderate degree    MEDICATIONS:    Scheduled Meds:  amLODipine  5 mg Oral Daily   docusate sodium  100 mg Oral BID   Continuous Infusions:  cefTRIAXone (ROCEPHIN)  IV Stopped (07/30/22 0237)   methocarbamol (ROBAXIN) IV     vancomycin Stopped (07/30/22 0420)   PRN Meds:.acetaminophen **OR** acetaminophen, hydrALAZINE, menthol-cetylpyridinium **OR** phenol, methocarbamol **OR** methocarbamol (ROBAXIN) IV, ondansetron **OR** ondansetron (ZOFRAN) IV, mouth rinse, oxyCODONE, oxyCODONE  SUBJECTIVE:   24 hour events:  No acute events noted Afebrile, Tmax 98.2 Hypertensive OR cultures no growth No new labs this morning   Patient seen this morning.  She is sitting up in the chair eating breakfast.  She has no acute complaints.  Review of Systems  All other systems reviewed and are negative.     OBJECTIVE:   Blood pressure (!) 183/90, pulse 91, temperature 98.1 F (36.7 C), temperature source Oral, resp. rate 20, height 5' 7"$  (1.702 m), weight 59 kg, SpO2 96 %. Body mass index is 20.37 kg/m.  Physical Exam Constitutional:      Appearance: Normal appearance.  HENT:     Head: Normocephalic and atraumatic.  Eyes:     Extraocular  Movements: Extraocular movements intact.     Conjunctiva/sclera: Conjunctivae normal.  Neck:     Comments: She is in a cervical collar Pulmonary:     Effort: Pulmonary effort is normal. No respiratory distress.  Abdominal:     General: There is no distension.     Palpations: Abdomen is soft.  Musculoskeletal:        General: Normal range of motion.  Skin:    General: Skin is warm and dry.     Findings: No rash.  Neurological:     General: No focal deficit present.     Mental Status: She is alert and oriented to person, place, and time.  Psychiatric:        Mood and Affect: Mood normal.        Behavior: Behavior normal.      Lab Results: Lab Results  Component Value Date   WBC 10.5 07/29/2022   HGB 12.6 07/29/2022   HCT 37.2 07/29/2022   MCV 86.3 07/29/2022   PLT 378 07/29/2022    Lab Results  Component Value Date   NA 138 07/29/2022   K 4.3 07/29/2022   CO2 23 07/29/2022   GLUCOSE 109 (H) 07/29/2022   BUN 15 07/29/2022   CREATININE 1.12 (H) 07/29/2022   CALCIUM 9.4 07/29/2022   GFRNONAA 54 (L) 07/29/2022   GFRAA 50 (L) 06/20/2015    Lab Results  Component Value Date   ALT 22 07/29/2022   AST 44 (H) 07/29/2022   ALKPHOS 54 07/29/2022   BILITOT 0.7 07/29/2022    No results found  for: "CRP"  No results found for: "ESRSEDRATE"   I have reviewed the micro and lab results in Epic.  Imaging: [place] EKG SITE RITE  Result Date: 07/30/2022 If Site Rite image not attached, placement could not be confirmed due to current cardiac rhythm.    Imaging independently reviewed in Epic.    Raynelle Highland for Infectious Disease [facility] [phone] pager 07/30/2022, 11:52 AM

## 2022-07-30 NOTE — Progress Notes (Signed)
Physical Therapy Treatment Patient Details Name: Kristen Bautista MRN: [id number] DOB: April 20, 1956 Today's Date: 07/30/2022   History of Present Illness 67 year old F without significant medical history presents to the ER 07/26/22 with about a week to 2-week history of worsening neck pain, bilateral arm paresthesias and intermittent paresthesias of her feet. 2-3 days PTA with difficulty standing. 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 2/17 C3-6 PCDF.    PT Comments    Patient continues with 10/10 pain when moving despite premedication 1 hour prior to session (much less when in supported sitting or lying down).  Continues with ataxic-like gait due to decr sensation and motor control of bil LEs. Today was able to widen BOS without scissoring with cues. Highly motivated to improve and become independent. Remains excellent rehab candidate with good family support (cousin).     Recommendations for follow up therapy are one component of a multi-disciplinary discharge planning process, led by the attending physician.  Recommendations may be updated based on patient status, additional functional criteria and insurance authorization.  Follow Up Recommendations  Acute inpatient rehab (3hours/day)     Assistance Recommended at Discharge Frequent or constant Supervision/Assistance  Patient can return home with the following A little help with walking and/or transfers;A little help with bathing/dressing/bathroom;Assistance with cooking/housework;Assist for transportation;Help with stairs or ramp for entrance   Equipment Recommendations  Rolling walker (2 wheels)    Recommendations for Other Services       Precautions / Restrictions Precautions Precautions: Cervical Precaution Booklet Issued: No Precaution Comments: ok for out of brace in bed, for showering, and for short trips to bathroom Required Braces or Orthoses: Cervical Brace Cervical Brace: Hard collar;Other (comment) (when  OOB) Restrictions Weight Bearing Restrictions: No     Mobility  Bed Mobility Overal bed mobility: Needs Assistance Bed Mobility: Rolling, Sidelying to Sit Rolling: Min assist Sidelying to sit: Mod assist       General bed mobility comments: vc for sequencing and assist at lower torso to complete rolling; assist to move legs over the EOB and raise torso    Transfers Overall transfer level: Needs assistance Equipment used: Rolling walker (2 wheels) Transfers: Sit to/from Stand Sit to Stand: Min assist, +2 physical assistance           General transfer comment: pt anxious re: pain and required physical assist to initiate; assist to control descent as she could not feel her hands on the armrests    Ambulation/Gait Ambulation/Gait assistance: Mod assist, +2 physical assistance, +2 safety/equipment Gait Distance (Feet): 17 Feet Assistive device: Rolling walker (2 wheels) Gait Pattern/deviations: Step-through pattern, Steppage, Ataxic, Wide base of support   Gait velocity interpretation: <1.8 ft/sec, indicate of risk for recurrent falls   General Gait Details: pt with numbness bil feet; 2 person assist due to inability to direct legs in normal gait pattern although improved from 2/18 without scissoring (steadying assist and controlling RW); chair brought to pt due to fatigue   Stairs             Wheelchair Mobility    Modified Rankin (Stroke Patients Only)       Balance Overall balance assessment: Needs assistance Sitting-balance support: No upper extremity supported, Feet supported Sitting balance-Leahy Scale: Fair     Standing balance support: Bilateral upper extremity supported, Reliant on assistive device for balance Standing balance-Leahy Scale: Poor  Cognition Arousal/Alertness: Awake/alert Behavior During Therapy: Anxious (about pain; does not want to learn how to don/doff brace as she does not want to take it  off) Overall Cognitive Status: Within Functional Limits for tasks assessed                                 General Comments: Pt very motivated, able to answer all background questions and while anxious about pain and neck seems overall WFL - continue to monitor        Exercises      General Comments        Pertinent Vitals/Pain Pain Assessment Pain Assessment: 0-10 Pain Score: 10-Worst pain ever Pain Location: neck Pain Descriptors / Indicators: Operative site guarding Pain Intervention(s): Limited activity within patient's tolerance, Monitored during session, Premedicated before session, Repositioned    Home Living                          Prior Function            PT Goals (current goals can now be found in the care plan section) Acute Rehab PT Goals Patient Stated Goal: return home and return to work Time For Goal Achievement: 08/12/22 Potential to Achieve Goals: Good Progress towards PT goals: Progressing toward goals    Frequency    Min 5X/week      PT Plan Current plan remains appropriate    Co-evaluation              AM-PAC PT "6 Clicks" Mobility   Outcome Measure  Help needed turning from your back to your side while in a flat bed without using bedrails?: A Little Help needed moving from lying on your back to sitting on the side of a flat bed without using bedrails?: A Lot Help needed moving to and from a bed to a chair (including a wheelchair)?: Total Help needed standing up from a chair using your arms (e.g., wheelchair or bedside chair)?: Total Help needed to walk in hospital room?: Total Help needed climbing 3-5 steps with a railing? : Total 6 Click Score: 9    End of Session Equipment Utilized During Treatment: Gait belt;Cervical collar Activity Tolerance: Patient limited by fatigue;Patient limited by pain Patient left: in chair;with call bell/phone within reach;with chair alarm set Nurse Communication:  Mobility status PT Visit Diagnosis: Unsteadiness on feet (R26.81);Muscle weakness (generalized) (M62.81);Ataxic gait (R26.0);Other symptoms and signs involving the nervous system [id number])     Time: [id number] PT Time Calculation (min) (ACUTE ONLY): 22 min  Charges:  $Gait Training: 8-22 mins                      [place]  Office [phone]    Rexanne Mano 07/30/2022, 9:36 AM

## 2022-07-30 NOTE — Consult Note (Signed)
Physical Medicine and Rehabilitation Consult Reason for Consult:gait difficulties, sensory loss and pain Referring Physician: Cruzita Lederer   HPI: Kristen Bautista is a 67 y.o. female without a significant medical history who presented to emergency room at [facility] with a 2-week history of worsening neck and back pain as well as paresthesias in the hands and feet.  She denied any fevers or chills.  She says she does drink about 6 beers per day and smokes half pack daily.  She denies any bowel or bladder changes.  Symptoms usually were worse when she extends her neck.  Patient was seen by neurosurgery.  MRI revealed severe stenosis with cord changes at C3-C4 and questioned epidural abscess.  Patient was started on IV antibiotics.  On 07/28/2022 she underwent PCDF C3-6 and evacuation of epidural abscess at C3-C4.  Patient currently is on IV vancomycin and ceftriaxone for coverage.  Cultures are pending.  Patient mobilized with therapy this morning and pain was limiting at times.  Pain predominantly in her neck with some radiation into the arms when she moves her head.  Patient requires min assist for sit to stand transfers and is able to walk 17 feet mod assist with +2 physical assistance.  Patient reports to me that she finds it difficult to balance with the sensory loss in her feet.   Review of Systems  Constitutional:  Negative for malaise/fatigue.  HENT:  Negative for hearing loss.   Eyes:  Negative for blurred vision.  Respiratory: Negative.    Cardiovascular:  Negative for chest pain.  Gastrointestinal:  Negative for constipation, diarrhea, nausea and vomiting.  Genitourinary:  Negative for dysuria and urgency.  Musculoskeletal:  Positive for back pain, myalgias and neck pain.  Skin:  Negative for itching.  Neurological:  Positive for sensory change, focal weakness and headaches.  Psychiatric/Behavioral:  Negative for depression and suicidal ideas.    Past Medical History:   Diagnosis Date   Alcohol abuse    Anemia    Aortic atherosclerosis (HCC)    Cannabis abuse    Centrilobular emphysema (HCC)    Cervical myelopathy (HCC)    with BUE paresthesias   Hepatitis-C    HLD (hyperlipidemia)    HTN (hypertension)    Nicotine addiction    Tobacco abuse    Past Surgical History:  Procedure Laterality Date   ABDOMINAL HERNIA REPAIR     Family History  Problem Relation Age of Onset   High blood pressure Mother    Social History:  reports that she has been smoking cigarettes. She has a 8.00 pack-year smoking history. She has been exposed to tobacco smoke. She has never used smokeless tobacco. She reports current alcohol use of about 42.0 standard drinks of alcohol per week. She reports current drug use. Frequency: 2.00 times per week. Drug: Marijuana. Allergies: No Known Allergies No medications prior to admission.    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[employer], does not drive  Functional History: Prior Function Prior Level of Function : Independent/Modified Independent ADLs Comments: 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) Functional Status:  Mobility: Bed Mobility  Overal bed mobility: Needs Assistance Bed Mobility: Rolling, Sidelying to Sit Rolling: Min assist Sidelying to sit: Mod assist General bed mobility comments: vc for sequencing and assist at lower torso to complete rolling; assist to move legs over the EOB and raise torso Transfers Overall transfer  level: Needs assistance Equipment used: Rolling walker (2 wheels) Transfers: Sit to/from Stand Sit to Stand: Min assist, +2 physical assistance General transfer comment: pt anxious re: pain and required physical assist to initiate; assist to control descent as she could not feel her hands on the armrests Ambulation/Gait Ambulation/Gait assistance: Mod assist, +2 physical assistance, +2 safety/equipment Gait Distance (Feet): 17 Feet Assistive device: Rolling walker (2 wheels) Gait Pattern/deviations: Step-through pattern, Steppage, Ataxic, Wide base of support General Gait Details: pt with numbness bil feet; 2 person assist due to inability to direct legs in normal gait pattern although improved from 2/18 without scissoring (steadying assist and controlling RW); chair brought to pt due to fatigue Gait velocity interpretation: <1.8 ft/sec, indicate of risk for recurrent falls    ADL: ADL Overall ADL's : Needs assistance/impaired Eating/Feeding: Minimal assistance, Sitting Eating/Feeding Details (indicate cue type and reason): will need to continue to be assessed for potential adaoptive tools Grooming: Minimal assistance, Sitting, Wash/dry hands Grooming Details (indicate cue type and reason): increased pain/decreased sensation Upper Body Bathing: Moderate assistance, Sitting Lower Body Bathing: Maximal assistance, Sitting/lateral leans Upper Body Dressing : Minimal assistance, Sitting Upper Body Dressing Details (indicate cue type and reason): to don extra gown like robe Lower Body Dressing: Maximal assistance Toilet Transfer: Minimal assistance, +2 for physical assistance, +2 for safety/equipment, Ambulation, Rolling walker (2 wheels) Toilet Transfer Details (indicate cue type and reason): ataxic gait, struggles to maintain grasp on walker handle on Right Toileting- Clothing Manipulation and Hygiene: Moderate assistance, Sit to/from stand Functional mobility during ADLs: Minimal  assistance, +2 for physical assistance, +2 for safety/equipment, Rolling walker (2 wheels)  Cognition: Cognition Overall Cognitive Status: Within Functional Limits for tasks assessed Orientation Level: Oriented X4 Cognition Arousal/Alertness: Awake/alert Behavior During Therapy: Anxious (about pain; does not want to learn how to don/doff brace as she does not want to take it off) Overall Cognitive Status: Within Functional Limits for tasks assessed General Comments: Pt very motivated, able to answer all background questions and while anxious about pain and neck seems overall WFL - continue to monitor  Blood pressure (!) 152/89, pulse (!) 111, temperature 98.1 F (36.7 C), temperature source Oral, resp. rate 20, height 5' 7"$  (1.702 m), weight 59 kg, SpO2 98 %. Physical Exam Constitutional:      General: She is not in acute distress.    Appearance: She is not ill-appearing.  HENT:     Head: Atraumatic.     Nose: Nose normal.     Mouth/Throat:     Mouth: Mucous membranes are moist.  Eyes:     Extraocular Movements: Extraocular movements intact.     Pupils: Pupils are equal, round, and reactive to light.  Neck:     Comments: Cervical collar in place Cardiovascular:     Rate and Rhythm: Tachycardia present.  Pulmonary:     Effort: Pulmonary effort is normal.  Abdominal:     Palpations: Abdomen is soft.  Musculoskeletal:     Comments: Patient reports neck pain with any movements of her head today.  Skin:    General: Skin is warm.     Comments: Neck incision is dressed  Neurological:     Mental Status: She is alert.  Comments: Alert and oriented x 3. Normal insight and awareness. Intact Memory. Normal language and speech. Cranial nerve exam unremarkable. UE 3+ to 4/5 prox to 3- to 3/5 distal. LE 3+ to 4/5 prox to distal.  Patient with decreased sensation to light touch and pinprick as well as proprioception in the bilateral feet up to the ankles as well as in the hands up to the  wrist.  No abnormal resting tone.  No clonus.     No results found for this or any previous visit (from the past 24 hour(s)). [place] EKG SITE RITE  Result Date: 07/30/2022 If Site Rite image not attached, placement could not be confirmed due to current cardiac rhythm.   Assessment/Plan: Diagnosis: C3-C4 cervical epidural abscess with myelopathy.  Patient status post C3-C6 ACDF Does the need for close, 24 hr/day medical supervision in concert with the patient's rehab needs make it unreasonable for this patient to be served in a less intensive setting? Yes Co-Morbidities requiring supervision/potential complications:  -pain mgt -IV abx/ID considerations -wound care Due to bladder management, bowel management, safety, skin/wound care, disease management, medication administration, pain management, and patient education, does the patient require 24 hr/day rehab nursing? Yes Does the patient require coordinated care of a physician, rehab nurse, therapy disciplines of PT, OT to address physical and functional deficits in the context of the above medical diagnosis(es)? Yes Addressing deficits in the following areas: balance, endurance, locomotion, strength, transferring, bowel/bladder control, bathing, dressing, feeding, grooming, toileting, and psychosocial support Can the patient actively participate in an intensive therapy program of at least 3 hrs of therapy per day at least 5 days per week? Yes The potential for patient to make measurable gains while on inpatient rehab is excellent Anticipated functional outcomes upon discharge from inpatient rehab are modified independent and supervision  with PT, modified independent and supervision with OT, n/a with SLP. Estimated rehab length of stay to reach the above functional goals is: 13-17 days Anticipated discharge destination: Home Overall Rehab/Functional Prognosis: excellent  POST ACUTE RECOMMENDATIONS: This patient's condition is appropriate for  continued rehabilitative care in the following setting: CIR Patient has agreed to participate in recommended program. Yes Note that insurance prior authorization may be required for reimbursement for recommended care.  Comment: Rehab Admissions Coordinator to follow up    MEDICAL RECOMMENDATIONS: SQ Lovenox 59m q24 hours for DVT prophylaxis Consider gabapentin or lyrica for neuropathic pain   I have personally performed a face to face diagnostic evaluation of this patient. Additionally, I have examined the patient's medical record including any pertinent labs and radiographic images. If the physician assistant has documented in this note, I have reviewed and edited or otherwise concur with the physician assistant's documentation.  Thanks,  ZMeredith Staggers MD 07/30/2022

## 2022-07-30 NOTE — Progress Notes (Signed)
   Providing Compassionate, Quality Care - Together  NEUROSURGERY PROGRESS NOTE   S: No issues overnight.   O: EXAM:  BP (!) 175/92 (BP Location: Right Arm)   Pulse 81   Temp 98.2 F (36.8 C) (Oral)   Resp 20   Ht 5' 7"$  (1.702 m)   Wt 59 kg   SpO2 95%   BMI 20.37 kg/m   Awake, alert, oriented x3 PERRL Speech fluent, appropriate  CNs grossly intact  4+/5 BUE/BLE  Incision clean dry and intact  ASSESSMENT:  67 y.o. 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  Please do not hesitate to call with questions or concerns.   Elwin Sleight, [facility] Cell: [phone]

## 2022-07-30 NOTE — Progress Notes (Signed)
Peripherally Inserted Central Catheter Placement  The IV Nurse has discussed with the patient and/or persons authorized to consent for the patient, the purpose of this procedure and the potential benefits and risks involved with this procedure.  The benefits include less needle sticks, lab draws from the catheter, and the patient may be discharged home with the catheter. Risks include, but not limited to, infection, bleeding, blood clot (thrombus formation), and puncture of an artery; nerve damage and irregular heartbeat and possibility to perform a PICC exchange if needed/ordered by physician.  Alternatives to this procedure were also discussed.  Bard Power PICC patient education guide, fact sheet on infection prevention and patient information card has been provided to patient /or left at bedside.    PICC Placement Documentation  PICC Single Lumen [id number] Right Basilic 38 cm 0 cm (Active)  Indication for Insertion or Continuance of Line Home intravenous therapies (PICC only);Prolonged intravenous therapies 07/30/22 2115  Exposed Catheter (cm) 0 cm 07/30/22 2115  Site Assessment Clean, Dry, Intact 07/30/22 2115  Line Status Flushed;Saline locked;Blood return noted 07/30/22 2115  Dressing Type Transparent;Securing device 07/30/22 2115  Dressing Status Antimicrobial disc in place;Clean, Dry, Intact 07/30/22 2115  Safety Lock Not Applicable [id number]  Line Care Connections checked and tightened 07/30/22 2115  Dressing Intervention New dressing 07/30/22 2115  Dressing Change Due 08/06/22 07/30/22 2115       Shon Hale 07/30/2022, 9:58 PM
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
## 2022-07-31 DIAGNOSIS — M4802 Spinal stenosis, cervical region: Secondary | ICD-10-CM | POA: Diagnosis not present

## 2022-07-31 DIAGNOSIS — G062 Extradural and subdural abscess, unspecified: Secondary | ICD-10-CM | POA: Diagnosis not present

## 2022-07-31 DIAGNOSIS — G959 Disease of spinal cord, unspecified: Secondary | ICD-10-CM | POA: Diagnosis not present

## 2022-07-31 LAB — SEDIMENTATION RATE: Sed Rate: 104 mm/hr — ABNORMAL HIGH (ref 0–22)

## 2022-07-31 LAB — C-REACTIVE PROTEIN: CRP: 11.4 mg/dL — ABNORMAL HIGH (ref <1.0)

## 2022-07-31 MED ORDER — SODIUM CHLORIDE 0.9 % IV SOLN
2.0000 g | INTRAVENOUS | Status: DC
  Administered 2022-08-01: 2 g via INTRAVENOUS
  Filled 2022-07-31: qty 20

## 2022-07-31 MED ORDER — ENSURE ENLIVE PO LIQD
237.0000 mL | Freq: Two times a day (BID) | ORAL | Status: DC
  Administered 2022-07-31 – 2022-08-01 (×4): 237 mL via ORAL

## 2022-07-31 MED ORDER — HEPARIN SODIUM (PORCINE) 5000 UNIT/ML IJ SOLN
5000.0000 [IU] | Freq: Three times a day (TID) | INTRAMUSCULAR | Status: DC
  Filled 2022-07-31 (×2): qty 1

## 2022-07-31 NOTE — Progress Notes (Signed)
Occupational Therapy Treatment Patient Details Name: Kristen Bautista MRN: [id number] DOB: 1956/02/19 Today's Date: 07/31/2022   History of present illness 67 year old F without significant medical history presents to the ER 07/26/22 with about a week to 2-week history of worsening neck pain, bilateral arm paresthesias and intermittent paresthesias of her feet. 2-3 days PTA with difficulty standing. 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 2/17 C3-6 PCDF.   OT comments  Pt making limited progress towards OT goals this session, slightly limited by pain but Pt pushes through and worked hard. Pt demonstrating transfers at mod A +2 increased posterior lean in sitting initially. Pt continues to struggle to hold onto grips of RW with R hand. Also focused session on providing red foam handles to build up utensils for ease of self-feeding. Pt reports improved confidence and ability to hold onto utensils, she will use and report back next OT session. OT will continue to work with Pt acutely and AIR remains appropriate DC at this time.    Recommendations for follow up therapy are one component of a multi-disciplinary discharge planning process, led by the attending physician.  Recommendations may be updated based on patient status, additional functional criteria and insurance authorization.    Follow Up Recommendations  Acute inpatient rehab (3hours/day)     Assistance Recommended at Discharge Frequent or constant Supervision/Assistance  Patient can return home with the following  A lot of help with walking and/or transfers;A lot of help with bathing/dressing/bathroom;Assistance with cooking/housework;Assistance with feeding;Assist for transportation;Help with stairs or ramp for entrance   Equipment Recommendations  BSC/3in1;Tub/shower bench    Recommendations for Other Services Rehab consult;PT consult    Precautions / Restrictions Precautions Precautions: Cervical Precaution Booklet Issued:  No Precaution Comments: ok for out of brace in bed, for showering, and for short trips to bathroom Required Braces or Orthoses: Cervical Brace Cervical Brace: Hard collar;Other (comment) (when OOB) Restrictions Weight Bearing Restrictions: No       Mobility Bed Mobility Overal bed mobility: Needs Assistance Bed Mobility: Rolling, Sidelying to Sit Rolling: Supervision Sidelying to sit: Mod assist, +2 for safety/equipment       General bed mobility comments: vc for sequencing and raise torso    Transfers Overall transfer level: Needs assistance Equipment used: Rolling walker (2 wheels) Transfers: Sit to/from Stand Sit to Stand: Min assist, +2 physical assistance           General transfer comment: pt anxious re: pain and required physical assist to initiate and then to stabilize upon standing; assist to control descent as she could not feel her hands on the armrests     Balance Overall balance assessment: Needs assistance Sitting-balance support: No upper extremity supported, Feet supported Sitting balance-Leahy Scale: Fair Sitting balance - Comments: initial sitting balance poor with pt wanting to lean back against therapist due to neck pain when sitting upright; once support behind her no longer there, she was able to hold herself upright without UE support   Standing balance support: Bilateral upper extremity supported, Reliant on assistive device for balance Standing balance-Leahy Scale: Poor Standing balance comment: initial lean anteriorly and to the right                           ADL either performed or assessed with clinical judgement   ADL Overall ADL's : Needs assistance/impaired Eating/Feeding: Minimal assistance;With adaptive utensils;Sitting Eating/Feeding Details (indicate cue type and reason): provided red foam handles for self-feeding and education  on how to use Grooming: Minimal assistance;Sitting;Wash/dry hands Grooming Details (indicate  cue type and reason): increased pain/decreased sensation         Upper Body Dressing : Minimal assistance;Sitting Upper Body Dressing Details (indicate cue type and reason): to don extra gown like robe     Toilet Transfer: +2 for physical assistance;+2 for safety/equipment;Ambulation;Rolling walker (2 wheels);Moderate assistance Toilet Transfer Details (indicate cue type and reason): ataxic gait, struggles to maintain grasp on walker handle on Right         Functional mobility during ADLs: Minimal assistance;+2 for physical assistance;+2 for safety/equipment;Rolling walker (2 wheels) General ADL Comments: decreased sensation and grasp impacting ADL and transfers most today after pain    Extremity/Trunk Assessment Upper Extremity Assessment Upper Extremity Assessment:  (same as last session)            Vision   Vision Assessment?: No apparent visual deficits   Perception     Praxis      Cognition Arousal/Alertness: Awake/alert Behavior During Therapy: Anxious (re: pain) Overall Cognitive Status: Within Functional Limits for tasks assessed                                 General Comments: Pt very motivated, but anxious re: movement causing increased pain in the back of her neck        Exercises      Shoulder Instructions       General Comments      Pertinent Vitals/ Pain       Pain Assessment Pain Assessment: 0-10 Pain Score: 10-Worst pain ever Pain Location: neck Pain Descriptors / Indicators: Operative site guarding Pain Intervention(s): Limited activity within patient's tolerance, Monitored during session, Repositioned, Other (comment) (brace adjusted)  Home Living                                          Prior Functioning/Environment              Frequency  Min 2X/week        Progress Toward Goals  OT Goals(current goals can now be found in the care plan section)  Progress towards OT goals: Progressing  toward goals (limited progress due to pain today)  Acute Rehab OT Goals Patient Stated Goal: get back to independent OT Goal Formulation: With patient Time For Goal Achievement: 08/12/22 Potential to Achieve Goals: Good  Plan Discharge plan remains appropriate    Co-evaluation    PT/OT/SLP Co-Evaluation/Treatment: Yes Reason for Co-Treatment: Complexity of the patient's impairments (multi-system involvement);For patient/therapist safety PT goals addressed during session: Mobility/safety with mobility;Balance;Proper use of DME OT goals addressed during session: ADL's and self-care;Proper use of Adaptive equipment and DME;Strengthening/ROM      AM-PAC OT "6 Clicks" Daily Activity     Outcome Measure   Help from another person eating meals?: A Little Help from another person taking care of personal grooming?: A Little Help from another person toileting, which includes using toliet, bedpan, or urinal?: A Lot Help from another person bathing (including washing, rinsing, drying)?: A Lot Help from another person to put on and taking off regular upper body clothing?: A Lot Help from another person to put on and taking off regular lower body clothing?: A Lot 6 Click Score: 14    End of Session Equipment Utilized During Treatment: Gait  belt;Rolling walker (2 wheels);Cervical collar  OT Visit Diagnosis: Unsteadiness on feet (R26.81);Other abnormalities of gait and mobility (R26.89);Muscle weakness (generalized) (M62.81);Feeding difficulties (R63.3);Other symptoms and signs involving the nervous system (R29.898)   Activity Tolerance Patient limited by pain   Patient Left in chair;with chair alarm set;with call bell/phone within reach   Nurse Communication Mobility status;Precautions        Time: [id number] OT Time Calculation (min): 33 min  Charges: OT General Charges $OT Visit: 1 Visit OT Treatments $Self Care/Home Management : 8-22 mins Jesse Sans OTR/L Acute Rehabilitation  Services Office: [place] 07/31/2022, 1:31 PM

## 2022-07-31 NOTE — PMR Pre-admission (Signed)
PMR Admission Coordinator Pre-Admission Assessment  Patient: Kristen Bautista is an 67 y.o., female MRN: [id number] DOB: [birth year] Height: 5\' 7"  (170.2 cm) Weight: 60.4 kg              Insurance Information HMO: yes    PPO:      PCP:      IPA:      80/20:      OTHER:  PRIMARY: [employer]      Policy#: [id number]      Subscriber: pt CM Name: ***      Phone#: ***     Fax#: *** Pre-Cert#: [id number] auth for CIR from *** with updates due to *** at fax listed above on ***      Employer:  Benefits:  Phone #:      Name:  Eff. Date: 07/12/22     Deduct: $0      Out of Pocket Max: [phone] ($145 met)      Life Max: n/a  CIR: $395/day for days 1-5      SNF: 20 full days Outpatient:      Co-Pay: $40/visit Home Health: 100%      Co-Pay:  DME: 80%     Co-Pay: 20% Providers:  SECONDARY:       Policy#:       Phone#:   Artist:       Phone#:   The Engineer, materials Information Summary" for patients in Inpatient Rehabilitation Facilities with attached "Privacy Act Statement-Health Care Records" was provided and verbally reviewed with: Patient  Emergency Contact Information Contact Information     Name Relation Home Work Mobile   Kristen Bautista Son [phone]     Kristen Bautista   [phone]   Kristen Bautista,Kristen Bautista   [phone]      Current Medical History  Patient Admitting Diagnosis: 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 female without a significant medical history who presented to emergency room at [facility] on 07/26/22 with a 2-week history of worsening neck and back pain as well as paresthesias in the hands and feet.  She denied any fevers or chills.  She says she does drink about 6 beers per day and smokes half pack daily.  She denies any bowel or bladder changes.  Symptoms usually were worse when she extends her neck.  Patient was seen by neurosurgery.  MRI revealed severe stenosis with  cord changes at C3-C4 and questioned epidural abscess.  Patient was started on IV antibiotics.  On 07/28/2022 she underwent PCDF C3-6 and evacuation of epidural abscess at C3-C4.  Patient currently is on IV vancomycin and ceftriaxone for coverage.  Cultures are pending with [facility].  ID recommending vancomycin and ceftriaxone.  Possibility of daptomycin at discharge.  Pain predominantly in her neck with some radiation into the arms when she moves her head.     Glasgow Coma Scale Score: 15  Patient's medical record from [facility] has been reviewed by the rehabilitation admission coordinator and physician.  Past Medical History  Past Medical History:  Diagnosis Date   Alcohol abuse    Anemia    Aortic atherosclerosis (HCC)    Cannabis abuse    Centrilobular emphysema (HCC)    Cervical myelopathy (HCC)    with BUE paresthesias   Hepatitis-C    HLD (hyperlipidemia)    HTN (hypertension)    Nicotine addiction    Tobacco abuse  Has the patient had major surgery during 100 days prior to admission? Yes  Family History  family history includes High blood pressure in her mother.   Current Medications   Current Facility-Administered Medications:    acetaminophen (TYLENOL) tablet 650 mg, 650 mg, Oral, Q6H PRN, 650 mg at 07/29/22 1845 **OR** acetaminophen (TYLENOL) suppository 650 mg, 650 mg, Rectal, Q6H PRN, Carollee Herter, DO   amLODipine (NORVASC) tablet 5 mg, 5 mg, Oral, Daily, Elvera Lennox, Costin M, MD, 5 mg at 07/31/22 0850   [START ON 08/01/2022] cefTRIAXone (ROCEPHIN) 2 g in sodium chloride 0.9 % 100 mL IVPB, 2 g, Intravenous, Q24H, Ann Held, [facility]   Chlorhexidine Gluconate Cloth 2 % PADS 6 each, 6 each, Topical, Daily, Leatha Gilding, MD, 6 each at 07/31/22 0851   docusate sodium (COLACE) capsule 100 mg, 100 mg, Oral, BID, Dawley, Troy C, DO, 100 mg at 07/31/22 0850   feeding supplement (ENSURE ENLIVE / ENSURE PLUS) liquid 237 mL, 237 mL, Oral, BID BM, Elvera Lennox, Costin M, MD, 237  mL at 07/31/22 1219   heparin injection 5,000 Units, 5,000 Units, Subcutaneous, Q8H, Dawley, Troy C, DO   hydrALAZINE (APRESOLINE) injection 5 mg, 5 mg, Intravenous, Q6H PRN, Gherghe, Costin M, MD   menthol-cetylpyridinium (CEPACOL) lozenge 3 mg, 1 lozenge, Oral, PRN **OR** phenol (CHLORASEPTIC) mouth spray 1 spray, 1 spray, Mouth/Throat, PRN, Dawley, Troy C, DO   methocarbamol (ROBAXIN) tablet 500 mg, 500 mg, Oral, Q6H PRN, 500 mg at 07/30/22 1452 **OR** methocarbamol (ROBAXIN) 500 mg in dextrose 5 % 50 mL IVPB, 500 mg, Intravenous, Q6H PRN, Dawley, Troy C, DO   ondansetron (ZOFRAN) tablet 4 mg, 4 mg, Oral, Q6H PRN **OR** ondansetron (ZOFRAN) injection 4 mg, 4 mg, Intravenous, Q6H PRN, Carollee Herter, DO   Oral care mouth rinse, 15 mL, Mouth Rinse, PRN, Carollee Herter, DO   oxyCODONE (Oxy IR/ROXICODONE) immediate release tablet 10 mg, 10 mg, Oral, Q3H PRN, Dawley, Troy C, DO, 10 mg at 07/31/22 1217   oxyCODONE (Oxy IR/ROXICODONE) immediate release tablet 5 mg, 5 mg, Oral, Q3H PRN, Dawley, Troy C, DO, 5 mg at 07/29/22 1845   sodium chloride flush (NS) 0.9 % injection 10-40 mL, 10-40 mL, Intracatheter, Q12H, Gherghe, Costin M, MD, 20 mL at 07/30/22 2332   sodium chloride flush (NS) 0.9 % injection 10-40 mL, 10-40 mL, Intracatheter, PRN, Elvera Lennox, Costin M, MD   vancomycin (VANCOREADY) IVPB 500 mg/100 mL, 500 mg, Intravenous, Q12H, Leander Rams, RPH, Last Rate: 100 mL/hr at 07/31/22 0412, 500 mg at 07/31/22 1610  Patients Current Diet:  Diet Order             Diet regular Room service appropriate? Yes; Fluid consistency: Thin  Diet effective now                   Precautions / Restrictions Precautions Precautions: Cervical Precaution Booklet Issued: No Precaution Comments: ok for out of brace in bed, for showering, and for short trips to bathroom Cervical Brace: Hard collar, Other (comment) (when OOB) Restrictions Weight Bearing Restrictions: No   Has the patient had 2 or more falls or a  fall with injury in the past year?Yes  Prior Activity Level Limited Community (1-2x/wk): independent prior to admit, no DME used, not driving, working PT at the [employer] in [employer]  Prior Functional Level Prior Function Prior Level of Function : Independent/Modified Independent ADLs Comments: 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  Independent  Indoor Mobility: Did the patient need assistance with walking from room to room (with or without device)? Independent  Stairs: Did the patient need assistance with internal or external stairs (with or without device)? Independent  Functional Cognition: Did the patient need help planning regular tasks such as shopping or remembering to take medications? Independent  Patient Information Are you of Hispanic, Latino/a,or Spanish origin?: A. No, not of Hispanic, Latino/a, or Spanish origin What is your race?: B. Black or African American Do you need or want an interpreter to communicate with a doctor or health care staff?: 0. No  Patient's Response To:  Health Literacy and Transportation Is the patient able to respond to health literacy and transportation needs?: Yes Health Literacy - How often do you need to have someone help you when you read instructions, pamphlets, or other written material from your doctor or pharmacy?: Never In the past 12 months, has lack of transportation kept you from medical appointments or from getting medications?: No In the past 12 months, has lack of transportation kept you from meetings, work, or from getting things needed for daily living?: No  Journalist, newspaper / Equipment Home Assistive Devices/Equipment: None Home Equipment: Hand held shower head  Prior Device Use: Indicate devices/aids used by the patient prior to current illness, exacerbation or injury? None of the above  Current Functional Level Cognition   Overall Cognitive Status: Within Functional Limits for tasks assessed Orientation Level: Oriented X4 General Comments: Pt very motivated, but anxious re: movement causing increased pain in the back of her neck    Extremity Assessment (includes Sensation/Coordination)  Upper Extremity Assessment:  (same as last session) RUE Deficits / Details: overall 4/5 MMT, reports tingling when seated, and increased numbness with standing. Decreased proprioception, decreased control with pronation RUE Sensation: decreased light touch, decreased proprioception RUE Coordination: decreased fine motor, decreased gross motor LUE Deficits / Details: overall 4/5 MMT, reports tingling when seated, and increased numbness with standing. Decreased proprioception, LUE Sensation: decreased light touch, decreased proprioception LUE Coordination: decreased fine motor, decreased gross motor  Lower Extremity Assessment: Defer to PT evaluation RLE Deficits / Details: strength WFL; ataxic gait pattern with steppage gait, near-scissoring RLE Sensation: decreased light touch, decreased proprioception LLE Deficits / Details: strength WFL; ataxic gait pattern with steppage gait, near-scissoring LLE Sensation: decreased light touch, decreased proprioception    ADLs  Overall ADL's : Needs assistance/impaired Eating/Feeding: Minimal assistance, With adaptive utensils, Sitting Eating/Feeding Details (indicate cue type and reason): provided red foam handles for self-feeding and education on how to use Grooming: Minimal assistance, Sitting, Wash/dry hands Grooming Details (indicate cue type and reason): increased pain/decreased sensation Upper Body Bathing: Moderate assistance, Sitting Lower Body Bathing: Maximal assistance, Sitting/lateral leans Upper Body Dressing : Minimal assistance, Sitting Upper Body Dressing Details (indicate cue type and reason): to don extra gown like robe Lower Body Dressing: Maximal assistance Toilet  Transfer: +2 for physical assistance, +2 for safety/equipment, Ambulation, Rolling walker (2 wheels), Moderate assistance Toilet Transfer Details (indicate cue type and reason): ataxic gait, struggles to maintain grasp on walker handle on Right Toileting- Clothing Manipulation and Hygiene: Moderate assistance, Sit to/from stand Functional mobility during ADLs: Minimal assistance, +2 for physical assistance, +2 for safety/equipment, Rolling walker (2 wheels) General ADL Comments: decreased sensation and grasp impacting ADL and transfers most today after pain    Mobility  Overal bed mobility: Needs Assistance Bed Mobility: Rolling, Sidelying to Sit Rolling: Supervision Sidelying to sit:  Mod assist, +2 for safety/equipment General bed mobility comments: vc for sequencing and raise torso    Transfers  Overall transfer level: Needs assistance Equipment used: Rolling walker (2 wheels) Transfers: Sit to/from Stand Sit to Stand: Min assist, +2 physical assistance General transfer comment: pt anxious re: pain and required physical assist to initiate and then to stabilize upon standing; assist to control descent as she could not feel her hands on the armrests    Ambulation / Gait / Stairs / Wheelchair Mobility  Ambulation/Gait Ambulation/Gait assistance: Mod assist, +2 physical assistance, +2 safety/equipment Gait Distance (Feet): 12 Feet Assistive device: Rolling walker (2 wheels) Gait Pattern/deviations: Step-through pattern, Steppage, Ataxic, Wide base of support General Gait Details: pt with numbness bil feet; 2 person assist due to inability to direct legs in normal gait pattern, Rt hand coming off RW and needed assist to direct RW; chair brought to pt due to fatigue and incr pain Gait velocity interpretation: 1.31 - 2.62 ft/sec, indicative of limited community ambulator    Posture / Balance Dynamic Sitting Balance Sitting balance - Comments: initial sitting balance poor with pt wanting to  lean back against therapist due to neck pain when sitting upright; once support behind her no longer there, she was able to hold herself upright without UE support Balance Overall balance assessment: Needs assistance Sitting-balance support: No upper extremity supported, Feet supported Sitting balance-Leahy Scale: Fair Sitting balance - Comments: initial sitting balance poor with pt wanting to lean back against therapist due to neck pain when sitting upright; once support behind her no longer there, she was able to hold herself upright without UE support Standing balance support: Bilateral upper extremity supported, Reliant on assistive device for balance Standing balance-Leahy Scale: Poor Standing balance comment: initial lean anteriorly and to the right    Special needs/care consideration Skin surgical incision to neck     Previous Home Environment (from acute therapy documentation) Living Arrangements: Alone Available Help at Discharge: Family, Available 24 hours/day (cousin) Type of Home: House (townhouse) Home Layout: One level Home Access: Stairs to enter Entrance Stairs-Rails: Right, Left Entrance Stairs-Number of Steps: 1+1 Bathroom Shower/Tub: Tub/shower unit, Engineer, building services: Pharmacist, community: Yes Home Care Services: No Additional Comments: 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[employer], does not drive  Discharge Living Setting Plans for Discharge Living Setting: Patient's home, Lives with (comment) (cousin has come to stay with patient) Type of Home at Discharge: Apartment Discharge Home Layout: One level Discharge Home Access: Stairs to enter Entrance Stairs-Rails: Right, Left Entrance Stairs-Number of Steps: 1+1 Discharge Bathroom Shower/Tub: Tub/shower unit (pt bird bathes at baseline) Discharge Bathroom Toilet: Standard Discharge Bathroom Accessibility: Yes How Accessible: Accessible via walker Does the patient have any problems  obtaining your medications?: No  Social/Family/Support Systems Anticipated Caregiver: cousin, Kandis Nab Fidelis) Anticipated Caregiver's Contact Information: [phone] Ability/Limitations of Caregiver: none reported Caregiver Availability: 24/7 Discharge Plan Discussed with Primary Caregiver: Yes Is Caregiver In Agreement with Plan?: Yes Does Caregiver/Family have Issues with Lodging/Transportation while Pt is in Rehab?: No   Goals Patient/Family Goal for Rehab: PT/OT supervision to mod I, SLP n/a Expected length of stay: 13-17 days Additional Information: Discharge plan: cousin Ardath Sax to stay with patient (as he was PTA) and can provide assist if needed for ADLs and mobility Pt/Family Agrees to Admission and willing to participate: Yes Program Orientation Provided & Reviewed with Pt/Caregiver Including Roles  & Responsibilities: Yes  Barriers to Discharge: Insurance for SNF coverage   Decrease burden  of Care through IP rehab admission: n/a   Possible need for SNF placement upon discharge:Not anticipated.  Pt with support from her cousin Ardath Sax) at discharge.  He will be with her 24/7 and is comfortable providing assist with ADls if needed.    Patient Condition: {PATIENT'S CONDITION:22832}  Preadmission Screen Completed By:  Stephania Fragmin, PT, 07/31/2022 4:21 PM ______________________________________________________________________   Discussed status with Dr. Marland Kitchenon***at *** and received approval for admission today.  Admission Coordinator:  Stephania Fragmin, time***/Date***

## 2022-07-31 NOTE — Progress Notes (Signed)
Physical Therapy Treatment Patient Details Name: Kristen Bautista MRN: [id number] DOB: [birth year] Today's Date: 07/31/2022   History of Present Illness 67 year old F without significant medical history presents to the ER 07/26/22 with about a week to 2-week history of worsening neck pain, bilateral arm paresthesias and intermittent paresthesias of her feet. 2-3 days PTA with difficulty standing. 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 2/17 C3-6 PCDF.    PT Comments    Patient more painful this date, requiring incr rest breaks (in supported sitting to decr neck pain). Noted pt ~30 minutes until next dose of pain medication. Patient able to push through to stand and ambulate with +2 mod assist due to ataxic-like gait due to decr bil LE sensation and poor ability to grip and direct RW.     Recommendations for follow up therapy are one component of a multi-disciplinary discharge planning process, led by the attending physician.  Recommendations may be updated based on patient status, additional functional criteria and insurance authorization.  Follow Up Recommendations  Acute inpatient rehab (3hours/day)     Assistance Recommended at Discharge Frequent or constant Supervision/Assistance  Patient can return home with the following A little help with walking and/or transfers;A little help with bathing/dressing/bathroom;Assistance with cooking/housework;Assist for transportation;Help with stairs or ramp for entrance   Equipment Recommendations  Rolling walker (2 wheels)    Recommendations for Other Services       Precautions / Restrictions Precautions Precautions: Cervical Precaution Booklet Issued: No Precaution Comments: ok for out of brace in bed, for showering, and for short trips to bathroom Required Braces or Orthoses: Cervical Brace Cervical Brace: Hard collar;Other (comment) (when OOB) Restrictions Weight Bearing Restrictions: No     Mobility  Bed Mobility Overal bed  mobility: Needs Assistance Bed Mobility: Rolling, Sidelying to Sit Rolling: Supervision Sidelying to sit: Mod assist       General bed mobility comments: vc for sequencing and raise torso    Transfers Overall transfer level: Needs assistance Equipment used: Rolling walker (2 wheels) Transfers: Sit to/from Stand Sit to Stand: Min assist, +2 physical assistance           General transfer comment: pt anxious re: pain and required physical assist to initiate and then to stabilize upon standing; assist to control descent as she could not feel her hands on the armrests    Ambulation/Gait Ambulation/Gait assistance: Mod assist, +2 physical assistance, +2 safety/equipment Gait Distance (Feet): 12 Feet Assistive device: Rolling walker (2 wheels) Gait Pattern/deviations: Step-through pattern, Steppage, Ataxic, Wide base of support   Gait velocity interpretation: 1.31 - 2.62 ft/sec, indicative of limited community ambulator   General Gait Details: pt with numbness bil feet; 2 person assist due to inability to direct legs in normal gait pattern, Rt hand coming off RW and needed assist to direct RW; chair brought to pt due to fatigue and incr pain   Stairs             Wheelchair Mobility    Modified Rankin (Stroke Patients Only)       Balance Overall balance assessment: Needs assistance Sitting-balance support: No upper extremity supported, Feet supported Sitting balance-Leahy Scale: Fair Sitting balance - Comments: initial sitting balance poor with pt wanting to lean back against therapist due to neck pain when sitting upright; once support behind her no longer there, she was able to hold herself upright without UE support   Standing balance support: Bilateral upper extremity supported, Reliant on assistive device for balance Standing balance-Leahy  Scale: Poor Standing balance comment: initial lean anteriorly and to the right                             Cognition Arousal/Alertness: Awake/alert Behavior During Therapy: Anxious (re: pain) Overall Cognitive Status: Within Functional Limits for tasks assessed                                 General Comments: Pt very motivated, but anxious re: movement causing increased pain in the back of her neck        Exercises General Exercises - Lower Extremity Long Arc Quad: AROM, Both, 10 reps Hip Flexion/Marching: AROM, Both, 10 reps, Seated Other Exercises Other Exercises: seated heel to shin bil LEs x 3 each; greater difficulty with LLE, but decr bil    General Comments        Pertinent Vitals/Pain Pain Assessment Pain Assessment: 0-10 Pain Score: 10-Worst pain ever Pain Location: neck Pain Descriptors / Indicators: Operative site guarding Pain Intervention(s): Limited activity within patient's tolerance, Monitored during session, Repositioned    Home Living                          Prior Function            PT Goals (current goals can now be found in the care plan section) Acute Rehab PT Goals Patient Stated Goal: return home and return to work Time For Goal Achievement: 08/12/22 Potential to Achieve Goals: Good Progress towards PT goals: Progressing toward goals (slowly due to incr pain)    Frequency    Min 5X/week      PT Plan Current plan remains appropriate    Co-evaluation PT/OT/SLP Co-Evaluation/Treatment: Yes Reason for Co-Treatment: Complexity of the patient's impairments (multi-system involvement);For patient/therapist safety PT goals addressed during session: Mobility/safety with mobility;Balance;Proper use of DME OT goals addressed during session: ADL's and self-care;Proper use of Adaptive equipment and DME;Strengthening/ROM      AM-PAC PT "6 Clicks" Mobility   Outcome Measure  Help needed turning from your back to your side while in a flat bed without using bedrails?: A Little Help needed moving from lying on your back to  sitting on the side of a flat bed without using bedrails?: A Lot Help needed moving to and from a bed to a chair (including a wheelchair)?: Total Help needed standing up from a chair using your arms (e.g., wheelchair or bedside chair)?: Total Help needed to walk in hospital room?: Total Help needed climbing 3-5 steps with a railing? : Total 6 Click Score: 9    End of Session Equipment Utilized During Treatment: Gait belt;Cervical collar Activity Tolerance: Patient limited by fatigue;Patient limited by pain Patient left: in chair;with call bell/phone within reach;with chair alarm set Nurse Communication: Mobility status;Other (comment) (no purewick in place) PT Visit Diagnosis: Unsteadiness on feet (R26.81);Muscle weakness (generalized) (M62.81);Ataxic gait (R26.0);Other symptoms and signs involving the nervous system (R29.898)     Time: [id number] PT Time Calculation (min) (ACUTE ONLY): 32 min  Charges:  $Gait Training: 8-22 mins                      Lynd  Office [phone]    Rexanne Mano 07/31/2022, 12:00 PM

## 2022-07-31 NOTE — Progress Notes (Addendum)
[facility]  Date of Admission:  07/26/2022           Reason for visit: Follow up on epidural abscess  Current antibiotics: Vancomycin Ceftriaxone    ASSESSMENT:    67 y.o. female admitted with:  Cervical stenosis, myelopathy: Status post OR 07/28/2022 with neurosurgery with C3-6 posterior cervical decompression, instrumented fusion.  Concern intraoperatively for epidural abscess and operative cultures no growth x 3 days.   RECOMMENDATIONS:    Continue current antibiotics with vancomycin and ceftriaxone PICC line is in place Following OR cultures Currently working with home health to determine coverage for daptomycin versus vancomycin.  Final antibiotic plan will be determined once that determination has been made Following   Principal Problem:   Cervical myelopathy ([place]) Active Problems:   DNR (do not resuscitate)/DNI(Do Not Intubate)   Malnutrition of moderate degree   Cervical stenosis of spine   Epidural abscess   Acute renal failure (HCC)    MEDICATIONS:    Scheduled Meds: . amLODipine  5 mg Oral Daily  . Chlorhexidine Gluconate Cloth  6 each Topical Daily  . docusate sodium  100 mg Oral BID  . feeding supplement  237 mL Oral BID BM  . sodium chloride flush  10-40 mL Intracatheter Q12H   Continuous Infusions: . cefTRIAXone (ROCEPHIN)  IV 2 g (07/31/22 0306)  . methocarbamol (ROBAXIN) IV    . vancomycin 500 mg (07/31/22 0412)   PRN Meds:.acetaminophen **OR** acetaminophen, hydrALAZINE, menthol-cetylpyridinium **OR** phenol, methocarbamol **OR** methocarbamol (ROBAXIN) IV, ondansetron **OR** ondansetron (ZOFRAN) IV, mouth rinse, oxyCODONE, oxyCODONE, sodium chloride flush  SUBJECTIVE:   24 hour events:  No acute events Therapy recommending CIR PICC line placed today Baseline ESR 104, CRP 11.4 Cultures remain no growth   No new complaints.  Review of Systems  All other systems reviewed and are negative.      OBJECTIVE:   Blood pressure (!) 155/75, pulse 90, temperature 98.4 F (36.9 C), temperature source Oral, resp. rate 18, height 5' 7"$  (1.702 m), weight 60.4 kg, SpO2 99 %. Body mass index is 20.86 kg/m.  Physical Exam Constitutional:      General: She is not in acute distress.    Appearance: Normal appearance.  HENT:     Head: Normocephalic and atraumatic.  Neck:     Comments: Cervical collar in place.  Pulmonary:     Effort: Pulmonary effort is normal. No respiratory distress.  Abdominal:     General: There is no distension.     Palpations: Abdomen is soft.  Musculoskeletal:     Comments: Moving her upper extremities.  PICC line in place.   Neurological:     General: No focal deficit present.     Mental Status: She is alert and oriented to person, place, and time.  Psychiatric:        Mood and Affect: Mood normal.        Behavior: Behavior normal.      Lab Results: Lab Results  Component Value Date   WBC 10.5 07/29/2022   HGB 12.6 07/29/2022   HCT 37.2 07/29/2022   MCV 86.3 07/29/2022   PLT 378 07/29/2022    Lab Results  Component Value Date   NA 138 07/29/2022   K 4.3 07/29/2022   CO2 23 07/29/2022   GLUCOSE 109 (H) 07/29/2022   BUN 15 07/29/2022   CREATININE 1.12 (H) 07/29/2022   CALCIUM 9.4 07/29/2022   GFRNONAA 54 (L) 07/29/2022  GFRAA 50 (L) 06/20/2015    Lab Results  Component Value Date   ALT 22 07/29/2022   AST 44 (H) 07/29/2022   ALKPHOS 54 07/29/2022   BILITOT 0.7 07/29/2022       Component Value Date/Time   CRP 11.4 (H) 07/31/2022 0434       Component Value Date/Time   ESRSEDRATE 104 (H) 07/31/2022 0434     I have reviewed the micro and lab results in Epic.  Imaging: [place] EKG SITE RITE  Result Date: 07/30/2022 If Site Rite image not attached, placement could not be confirmed due to current cardiac rhythm.    Imaging  independently reviewed in Epic.    [facility] [phone] pager 07/31/2022, 8:53 AM

## 2022-07-31 NOTE — Progress Notes (Signed)
Pt is disregarding treatment plan in regards to aspen neck collar. Pulling chin out when laying down. and taking off completely when sitting up. Teaching done with continued disregaurd. Ptt would like to speak with treatment team in regards to this matter.

## 2022-07-31 NOTE — Progress Notes (Addendum)
Inpatient Rehab Coordinator Note:  I met with patient at bedside to discuss CIR recommendations and goals/expectations of CIR stay.  We reviewed 3 hrs/day of therapy, physician follow up, and average length of stay 2 weeks (dependent upon progress) with goals of supervision to mod I.  We discussed caregiver support and she reports her cousin, Pryor Ochoa") has come to stay with her prior to admission 2/2 weakness.  He's on his way to the hospital and I will verify with him what assist he is comfortable with.  We discussed insurance auth and I will start that today.    12:45: Met with pt and Webbie at bedside.  Phone [phone].  He has recently come to stay with patient, can provide 24/7 assist if needed.  We discussed likely goals of mod I with supervision for some tasks (? Bathing/dressing) and he is able to provide this.  Will await insurance determination.   Shann Medal, PT, DPT Admissions Coordinator [phone] 07/31/22  11:38 AM
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
## 2022-08-01 ENCOUNTER — Other Ambulatory Visit: Payer: Self-pay

## 2022-08-01 ENCOUNTER — Inpatient Hospital Stay (HOSPITAL_COMMUNITY): Payer: No Typology Code available for payment source

## 2022-08-01 ENCOUNTER — Inpatient Hospital Stay (HOSPITAL_COMMUNITY)
Admission: RE | Admit: 2022-08-01 | Discharge: 2022-08-23 | DRG: 560 | Disposition: A | Discharge status: Home / Self Care | Payer: No Typology Code available for payment source | Source: Intra-hospital | Attending: Physical Medicine and Rehabilitation | Admitting: Physical Medicine and Rehabilitation

## 2022-08-01 ENCOUNTER — Encounter (HOSPITAL_COMMUNITY): Payer: Self-pay | Admitting: Physical Medicine and Rehabilitation

## 2022-08-01 DIAGNOSIS — R200 Anesthesia of skin: Secondary | ICD-10-CM | POA: Diagnosis not present

## 2022-08-01 DIAGNOSIS — R32 Unspecified urinary incontinence: Secondary | ICD-10-CM | POA: Diagnosis present

## 2022-08-01 DIAGNOSIS — Z1621 Resistance to vancomycin: Secondary | ICD-10-CM | POA: Diagnosis not present

## 2022-08-01 DIAGNOSIS — B952 Enterococcus as the cause of diseases classified elsewhere: Secondary | ICD-10-CM | POA: Diagnosis present

## 2022-08-01 DIAGNOSIS — F1990 Other psychoactive substance use, unspecified, uncomplicated: Secondary | ICD-10-CM

## 2022-08-01 DIAGNOSIS — Z8619 Personal history of other infectious and parasitic diseases: Secondary | ICD-10-CM

## 2022-08-01 DIAGNOSIS — Z79899 Other long term (current) drug therapy: Secondary | ICD-10-CM | POA: Diagnosis not present

## 2022-08-01 DIAGNOSIS — Z87898 Personal history of other specified conditions: Secondary | ICD-10-CM

## 2022-08-01 DIAGNOSIS — K592 Neurogenic bowel, not elsewhere classified: Secondary | ICD-10-CM | POA: Diagnosis present

## 2022-08-01 DIAGNOSIS — G061 Intraspinal abscess and granuloma: Secondary | ICD-10-CM | POA: Diagnosis not present

## 2022-08-01 DIAGNOSIS — I10 Essential (primary) hypertension: Secondary | ICD-10-CM | POA: Diagnosis present

## 2022-08-01 DIAGNOSIS — K5901 Slow transit constipation: Secondary | ICD-10-CM | POA: Diagnosis not present

## 2022-08-01 DIAGNOSIS — F17210 Nicotine dependence, cigarettes, uncomplicated: Secondary | ICD-10-CM | POA: Diagnosis present

## 2022-08-01 DIAGNOSIS — G959 Disease of spinal cord, unspecified: Secondary | ICD-10-CM | POA: Diagnosis present

## 2022-08-01 DIAGNOSIS — D62 Acute posthemorrhagic anemia: Secondary | ICD-10-CM | POA: Diagnosis present

## 2022-08-01 DIAGNOSIS — R202 Paresthesia of skin: Secondary | ICD-10-CM | POA: Diagnosis present

## 2022-08-01 DIAGNOSIS — K5900 Constipation, unspecified: Secondary | ICD-10-CM | POA: Diagnosis not present

## 2022-08-01 DIAGNOSIS — Z8661 Personal history of infections of the central nervous system: Secondary | ICD-10-CM

## 2022-08-01 DIAGNOSIS — J432 Centrilobular emphysema: Secondary | ICD-10-CM | POA: Diagnosis present

## 2022-08-01 DIAGNOSIS — R5381 Other malaise: Secondary | ICD-10-CM | POA: Diagnosis present

## 2022-08-01 DIAGNOSIS — D75839 Thrombocytosis, unspecified: Secondary | ICD-10-CM | POA: Diagnosis present

## 2022-08-01 DIAGNOSIS — Z4789 Encounter for other orthopedic aftercare: Secondary | ICD-10-CM | POA: Diagnosis not present

## 2022-08-01 DIAGNOSIS — G062 Extradural and subdural abscess, unspecified: Secondary | ICD-10-CM | POA: Diagnosis not present

## 2022-08-01 DIAGNOSIS — R3915 Urgency of urination: Secondary | ICD-10-CM | POA: Diagnosis present

## 2022-08-01 DIAGNOSIS — E785 Hyperlipidemia, unspecified: Secondary | ICD-10-CM | POA: Diagnosis present

## 2022-08-01 DIAGNOSIS — Z66 Do not resuscitate: Secondary | ICD-10-CM | POA: Diagnosis present

## 2022-08-01 DIAGNOSIS — I700 Atherosclerosis of aorta: Secondary | ICD-10-CM | POA: Diagnosis present

## 2022-08-01 DIAGNOSIS — Z681 Body mass index (BMI) 19 or less, adult: Secondary | ICD-10-CM

## 2022-08-01 DIAGNOSIS — M7989 Other specified soft tissue disorders: Secondary | ICD-10-CM

## 2022-08-01 DIAGNOSIS — F1910 Other psychoactive substance abuse, uncomplicated: Secondary | ICD-10-CM

## 2022-08-01 DIAGNOSIS — E440 Moderate protein-calorie malnutrition: Secondary | ICD-10-CM | POA: Diagnosis not present

## 2022-08-01 LAB — CULTURE, BLOOD (ROUTINE X 2)
Culture: NO GROWTH
Culture: NO GROWTH

## 2022-08-01 LAB — CBC
HCT: 31.1 % — ABNORMAL LOW (ref 36.0–46.0)
Hemoglobin: 10.5 g/dL — ABNORMAL LOW (ref 12.0–15.0)
MCH: 29.2 pg (ref 26.0–34.0)
MCHC: 33.8 g/dL (ref 30.0–36.0)
MCV: 86.6 fL (ref 80.0–100.0)
Platelets: 348 10*3/uL (ref 150–400)
RBC: 3.59 MIL/uL — ABNORMAL LOW (ref 3.87–5.11)
RDW: 12.3 % (ref 11.5–15.5)
WBC: 8.9 10*3/uL (ref 4.0–10.5)
nRBC: 0 % (ref 0.0–0.2)

## 2022-08-01 LAB — BASIC METABOLIC PANEL
Anion gap: 9 (ref 5–15)
BUN: 16 mg/dL (ref 8–23)
CO2: 27 mmol/L (ref 22–32)
Calcium: 9.5 mg/dL (ref 8.9–10.3)
Chloride: 101 mmol/L (ref 98–111)
Creatinine, Ser: 0.8 mg/dL (ref 0.44–1.00)
GFR, Estimated: >60 mL/min (ref >60)
Glucose, Bld: 96 mg/dL (ref 70–99)
Potassium: 3.8 mmol/L (ref 3.5–5.1)
Sodium: 137 mmol/L (ref 135–145)

## 2022-08-01 LAB — VANCOMYCIN, TROUGH: Vancomycin Tr: 13 ug/mL — ABNORMAL LOW (ref 15–20)

## 2022-08-01 LAB — CREATININE, SERUM
Creatinine, Ser: 0.71 mg/dL (ref 0.44–1.00)
GFR, Estimated: >60 mL/min (ref >60)

## 2022-08-01 MED ORDER — DOCUSATE SODIUM 100 MG PO CAPS: 100.0000 mg | ORAL_CAPSULE | Freq: Two times a day (BID) | ORAL | 0 refills | Status: DC

## 2022-08-01 MED ORDER — VANCOMYCIN HCL 500 MG/100ML IV SOLN
500.0000 mg | Freq: Two times a day (BID) | INTRAVENOUS | Status: DC
  Administered 2022-08-02 – 2022-08-15 (×27): 500 mg via INTRAVENOUS
  Filled 2022-08-01 (×31): qty 100

## 2022-08-01 MED ORDER — HEPARIN SODIUM (PORCINE) 5000 UNIT/ML IJ SOLN: 5000.0000 [IU] | Freq: Three times a day (TID) | INTRAMUSCULAR | Status: DC

## 2022-08-01 MED ORDER — SODIUM CHLORIDE 0.9% FLUSH
10.0000 mL | INTRAVENOUS | Status: DC | PRN
  Administered 2022-08-17: 10 mL

## 2022-08-01 MED ORDER — AMLODIPINE BESYLATE 5 MG PO TABS
5.0000 mg | ORAL_TABLET | Freq: Every day | ORAL | Status: DC
  Administered 2022-08-02 – 2022-08-23 (×22): 5 mg via ORAL
  Filled 2022-08-01 (×22): qty 1

## 2022-08-01 MED ORDER — SORBITOL 70 % SOLN
30.0000 mL | Freq: Every day | Status: DC | PRN
  Administered 2022-08-04: 30 mL via ORAL
  Filled 2022-08-01: qty 30

## 2022-08-01 MED ORDER — METHOCARBAMOL 500 MG PO TABS: 500.0000 mg | ORAL_TABLET | Freq: Four times a day (QID) | ORAL | Status: DC | PRN

## 2022-08-01 MED ORDER — POLYETHYLENE GLYCOL 3350 17 G PO PACK
17.0000 g | PACK | Freq: Every day | ORAL | Status: DC
  Administered 2022-08-01 – 2022-08-04 (×4): 17 g via ORAL
  Filled 2022-08-01 (×4): qty 1

## 2022-08-01 MED ORDER — OXYCODONE HCL 5 MG PO TABS: 5.0000 mg | ORAL_TABLET | ORAL | Status: DC | PRN

## 2022-08-01 MED ORDER — ENSURE ENLIVE PO LIQD: 237.0000 mL | Freq: Two times a day (BID) | ORAL | 12 refills | Status: DC

## 2022-08-01 MED ORDER — CHLORHEXIDINE GLUCONATE CLOTH 2 % EX PADS
6.0000 | MEDICATED_PAD | Freq: Two times a day (BID) | CUTANEOUS | Status: DC
  Administered 2022-08-01 – 2022-08-23 (×43): 6 via TOPICAL

## 2022-08-01 MED ORDER — ONDANSETRON HCL 4 MG PO TABS: 4.0000 mg | ORAL_TABLET | Freq: Four times a day (QID) | ORAL | Status: DC | PRN

## 2022-08-01 MED ORDER — ENSURE ENLIVE PO LIQD
237.0000 mL | Freq: Two times a day (BID) | ORAL | Status: DC
  Administered 2022-08-04 – 2022-08-23 (×38): 237 mL via ORAL

## 2022-08-01 MED ORDER — SODIUM CHLORIDE 0.9 % IV SOLN
2.0000 g | INTRAVENOUS | Status: DC
  Administered 2022-08-02 – 2022-08-04 (×3): 2 g via INTRAVENOUS
  Filled 2022-08-01 (×4): qty 20

## 2022-08-01 MED ORDER — OXYCODONE HCL 5 MG PO TABS
10.0000 mg | ORAL_TABLET | ORAL | Status: DC | PRN
  Administered 2022-08-01 – 2022-08-08 (×21): 10 mg via ORAL
  Filled 2022-08-01 (×23): qty 2

## 2022-08-01 MED ORDER — CEFTRIAXONE IV (FOR PTA / DISCHARGE USE ONLY): 2.0000 g | INTRAVENOUS | 0 refills | Status: DC

## 2022-08-01 MED ORDER — DOCUSATE SODIUM 100 MG PO CAPS
100.0000 mg | ORAL_CAPSULE | Freq: Two times a day (BID) | ORAL | Status: DC
  Filled 2022-08-01 (×2): qty 1

## 2022-08-01 MED ORDER — HEPARIN SODIUM (PORCINE) 5000 UNIT/ML IJ SOLN
5000.0000 [IU] | Freq: Three times a day (TID) | INTRAMUSCULAR | Status: DC
  Filled 2022-08-01 (×2): qty 1

## 2022-08-01 MED ORDER — OXYCODONE HCL 5 MG PO TABS: 5.0000 mg | ORAL_TABLET | ORAL | 0 refills | Status: DC | PRN

## 2022-08-01 MED ORDER — ONDANSETRON HCL 4 MG/2ML IJ SOLN: 4.0000 mg | Freq: Four times a day (QID) | INTRAMUSCULAR | Status: DC | PRN

## 2022-08-01 MED ORDER — ACETAMINOPHEN 325 MG PO TABS
650.0000 mg | ORAL_TABLET | Freq: Four times a day (QID) | ORAL | Status: DC | PRN
  Administered 2022-08-02 – 2022-08-21 (×7): 650 mg via ORAL
  Filled 2022-08-01 (×7): qty 2

## 2022-08-01 MED ORDER — ACETAMINOPHEN 325 MG PO TABS: 650.0000 mg | ORAL_TABLET | Freq: Four times a day (QID) | ORAL | Status: DC | PRN

## 2022-08-01 MED ORDER — METHOCARBAMOL 1000 MG/10ML IJ SOLN: 500.0000 mg | Freq: Four times a day (QID) | INTRAVENOUS | Status: DC | PRN

## 2022-08-01 MED ORDER — AMLODIPINE BESYLATE 5 MG PO TABS: 5.0000 mg | ORAL_TABLET | Freq: Every day | ORAL | Status: DC

## 2022-08-01 MED ORDER — VANCOMYCIN IV (FOR PTA / DISCHARGE USE ONLY): 500.0000 mg | Freq: Two times a day (BID) | INTRAVENOUS | 0 refills | Status: DC

## 2022-08-01 MED ORDER — OXYCODONE HCL 10 MG PO TABS: 10.0000 mg | ORAL_TABLET | ORAL | 0 refills | Status: DC | PRN

## 2022-08-01 MED ORDER — METHOCARBAMOL 500 MG PO TABS
500.0000 mg | ORAL_TABLET | Freq: Four times a day (QID) | ORAL | Status: DC | PRN
  Administered 2022-08-02 – 2022-08-13 (×4): 500 mg via ORAL
  Filled 2022-08-01 (×4): qty 1

## 2022-08-01 MED ORDER — ACETAMINOPHEN 650 MG RE SUPP: 650.0000 mg | Freq: Four times a day (QID) | RECTAL | Status: DC | PRN
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
## 2022-08-01 NOTE — Progress Notes (Signed)
PHARMACY CONSULT NOTE FOR:  OUTPATIENT  PARENTERAL ANTIBIOTIC THERAPY (OPAT)  Indication: Cervical epidural abscess Regimen: Rocephin 2g IV every 24 hours and Vancomycin 500 mg IV every 12 hours  End date: 09/08/22 (6 weeks from OR 07/28/22)  Noted plans for CIR - home health RN still inquiring on cost difference with insurance for Daptomycin compared to Vancomycin. Will continue to follow in CIR to determine need to adjust regimen at discharge.   IV antibiotic discharge orders are pended. To discharging provider:  please sign these orders via discharge navigator,  Select New Orders & click on the button choice - Manage This Unsigned Work.     Thank you for allowing pharmacy to be a part of this patient's care.  Alycia Rossetti, PharmD, BCPS Infectious Diseases Clinical Pharmacist 08/01/2022 10:38 AM   **Pharmacist phone directory can now be found on [email or web address] ([email or web address]).  Listed under [place].

## 2022-08-01 NOTE — Progress Notes (Signed)
Met with patient. Family at bedside. Oriented to rehab. Discussed educational binder. Educated on HTN. Patient reports that doesn't have high blood. Discussed that is taking Norvasc and HCTZ. Reports that doesn't take any medications at home other than vitamins. Reports that doesn't like taking medications because of the side effects. Encouraged medications as she has reported HTN, HLD (no labs and not taking medication), drinks large volumes of alcohol and smokes.  All of these factors increase her risk for other co-morbidities. Also A1C not available. Reports that has no issues with voiding but has not had a BM since she was admitted. Informed her that will work on it. Discouraged smoking and educated that may effect the healing process if continues to smoke. Reports that has not wanted to smoke. Reports that is drinking fluids and eating fruit.  Doesn't like to food. Suggested other to bring food from home because needs protein for healing. Vascular at bedside.
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
## 2022-08-01 NOTE — Progress Notes (Signed)
Physical Medicine and Rehabilitation Consult Reason for Consult:gait difficulties, sensory loss and pain Referring Physician: Cruzita Lederer     HPI: Kristen Bautista is a 67 y.o. female without a significant medical history who presented to emergency room at [facility] with a 2-week history of worsening neck and back pain as well as paresthesias in the hands and feet.  She denied any fevers or chills.  She says she does drink about 6 beers per day and smokes half pack daily.  She denies any bowel or bladder changes.  Symptoms usually were worse when she extends her neck.  Patient was seen by neurosurgery.  MRI revealed severe stenosis with cord changes at C3-C4 and questioned epidural abscess.  Patient was started on IV antibiotics.  On 07/28/2022 she underwent PCDF C3-6 and evacuation of epidural abscess at C3-C4.  Patient currently is on IV vancomycin and ceftriaxone for coverage.  Cultures are pending.  Patient mobilized with therapy this morning and pain was limiting at times.  Pain predominantly in her neck with some radiation into the arms when she moves her head.  Patient requires min assist for sit to stand transfers and is able to walk 17 feet mod assist with +2 physical assistance.  Patient reports to me that she finds it difficult to balance with the sensory loss in her feet.     Review of Systems  Constitutional:  Negative for malaise/fatigue.  HENT:  Negative for hearing loss.   Eyes:  Negative for blurred vision.  Respiratory: Negative.    Cardiovascular:  Negative for chest pain.  Gastrointestinal:  Negative for constipation, diarrhea, nausea and vomiting.  Genitourinary:  Negative for dysuria and urgency.  Musculoskeletal:  Positive for back pain, myalgias and neck pain.  Skin:  Negative for itching.  Neurological:  Positive for sensory change, focal weakness and headaches.  Psychiatric/Behavioral:  Negative for depression and suicidal ideas.         Past Medical  History:  Diagnosis Date   Alcohol abuse     Anemia     Aortic atherosclerosis (HCC)     Cannabis abuse     Centrilobular emphysema (HCC)     Cervical myelopathy (HCC)      with BUE paresthesias   Hepatitis-C     HLD (hyperlipidemia)     HTN (hypertension)     Nicotine addiction     Tobacco abuse           Past Surgical History:  Procedure Laterality Date   ABDOMINAL HERNIA REPAIR             Family History  Problem Relation Age of Onset   High blood pressure Mother      Social History:  reports that she has been smoking cigarettes. She has a 8.00 pack-year smoking history. She has been exposed to tobacco smoke. She has never used smokeless tobacco. She reports current alcohol use of about 42.0 standard drinks of alcohol per week. She reports current drug use. Frequency: 2.00 times per week. Drug: Marijuana. Allergies: No Known Allergies No medications prior to admission.      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[employer], does not drive  Functional History: Prior Function Prior Level of Function : Independent/Modified Independent ADLs Comments: 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) Functional Status:  Mobility: Bed Mobility Overal bed mobility: Needs Assistance Bed Mobility: Rolling, Sidelying to Sit Rolling: Min assist Sidelying to sit: Mod assist General bed mobility comments: vc for sequencing and assist at lower torso to complete rolling; assist to move legs over the EOB and  raise torso Transfers Overall transfer level: Needs assistance Equipment used: Rolling walker (2 wheels) Transfers: Sit to/from Stand Sit to Stand: Min assist, +2 physical assistance General transfer comment: pt anxious re: pain and required physical assist to initiate; assist to control descent as she could not feel her hands on the armrests Ambulation/Gait Ambulation/Gait assistance: Mod assist, +2 physical assistance, +2 safety/equipment Gait Distance (Feet): 17 Feet Assistive device: Rolling walker (2 wheels) Gait Pattern/deviations: Step-through pattern, Steppage, Ataxic, Wide base of support General Gait Details: pt with numbness bil feet; 2 person assist due to inability to direct legs in normal gait pattern although improved from 2/18 without scissoring (steadying assist and controlling RW); chair brought to pt due to fatigue Gait velocity interpretation: <1.8 ft/sec, indicate of risk for recurrent falls   ADL: ADL Overall ADL's : Needs assistance/impaired Eating/Feeding: Minimal assistance, Sitting Eating/Feeding Details (indicate cue type and reason): will need to continue to be assessed for potential adaoptive tools Grooming: Minimal assistance, Sitting, Wash/dry hands Grooming Details (indicate cue type and reason): increased pain/decreased sensation Upper Body Bathing: Moderate assistance, Sitting Lower Body Bathing: Maximal assistance, Sitting/lateral leans Upper Body Dressing : Minimal assistance, Sitting Upper Body Dressing Details (indicate cue type and reason): to don extra gown like robe Lower Body Dressing: Maximal assistance Toilet Transfer: Minimal assistance, +2 for physical assistance, +2 for safety/equipment, Ambulation, Rolling walker (2 wheels) Toilet Transfer Details (indicate cue type and reason): ataxic gait, struggles to maintain grasp on walker handle on Right Toileting- Clothing Manipulation and Hygiene: Moderate assistance, Sit to/from stand Functional  mobility during ADLs: Minimal assistance, +2 for physical assistance, +2 for safety/equipment, Rolling walker (2 wheels)   Cognition: Cognition Overall Cognitive Status: Within Functional Limits for tasks assessed Orientation Level: Oriented X4 Cognition Arousal/Alertness: Awake/alert Behavior During Therapy: Anxious (about pain; does not want to learn how to don/doff brace as she does not want to take it off) Overall Cognitive Status: Within Functional Limits for tasks assessed General Comments: Pt very motivated, able to answer all background questions and while anxious about pain and neck seems overall WFL - continue to monitor   Blood pressure (!) 152/89, pulse (!) 111, temperature 98.1 F (36.7 C), temperature source Oral, resp. rate 20, height 5' 7"$  (1.702 m), weight 59 kg, SpO2 98 %. Physical Exam Constitutional:      General: She is not in acute distress.    Appearance: She is not ill-appearing.  HENT:     Head: Atraumatic.     Nose: Nose normal.     Mouth/Throat:     Mouth: Mucous membranes are moist.  Eyes:     Extraocular Movements: Extraocular movements intact.     Pupils: Pupils are equal, round, and reactive to light.  Neck:     Comments: Cervical collar in place Cardiovascular:     Rate and Rhythm: Tachycardia present.  Pulmonary:     Effort: Pulmonary effort is normal.  Abdominal:     Palpations: Abdomen is soft.  Musculoskeletal:  Comments: Patient reports neck pain with any movements of her head today.  Skin:    General: Skin is warm.     Comments: Neck incision is dressed  Neurological:     Mental Status: She is alert.     Comments: Alert and oriented x 3. Normal insight and awareness. Intact Memory. Normal language and speech. Cranial nerve exam unremarkable. UE 3+ to 4/5 prox to 3- to 3/5 distal. LE 3+ to 4/5 prox to distal.  Patient with decreased sensation to light touch and pinprick as well as proprioception in the bilateral feet up to the ankles as  well as in the hands up to the wrist.  No abnormal resting tone.  No clonus.        Lab Results Last 24 Hours  No results found for this or any previous visit (from the past 24 hour(s)).    Imaging Results (Last 48 hours)  [place] EKG SITE RITE   Result Date: 07/30/2022 If Site Rite image not attached, placement could not be confirmed due to current cardiac rhythm.      Assessment/Plan: Diagnosis: C3-C4 cervical epidural abscess with myelopathy.  Patient status post C3-C6 ACDF Does the need for close, 24 hr/day medical supervision in concert with the patient's rehab needs make it unreasonable for this patient to be served in a less intensive setting? Yes Co-Morbidities requiring supervision/potential complications:  -pain mgt -IV abx/ID considerations -wound care Due to bladder management, bowel management, safety, skin/wound care, disease management, medication administration, pain management, and patient education, does the patient require 24 hr/day rehab nursing? Yes Does the patient require coordinated care of a physician, rehab nurse, therapy disciplines of PT, OT to address physical and functional deficits in the context of the above medical diagnosis(es)? Yes Addressing deficits in the following areas: balance, endurance, locomotion, strength, transferring, bowel/bladder control, bathing, dressing, feeding, grooming, toileting, and psychosocial support Can the patient actively participate in an intensive therapy program of at least 3 hrs of therapy per day at least 5 days per week? Yes The potential for patient to make measurable gains while on inpatient rehab is excellent Anticipated functional outcomes upon discharge from inpatient rehab are modified independent and supervision  with PT, modified independent and supervision with OT, n/a with SLP. Estimated rehab length of stay to reach the above functional goals is: 13-17 days Anticipated discharge destination: Home Overall  Rehab/Functional Prognosis: excellent   POST ACUTE RECOMMENDATIONS: This patient's condition is appropriate for continued rehabilitative care in the following setting: CIR Patient has agreed to participate in recommended program. Yes Note that insurance prior authorization may be required for reimbursement for recommended care.   Comment: Rehab Admissions Coordinator to follow up      MEDICAL RECOMMENDATIONS: SQ Lovenox 24m q24 hours for DVT prophylaxis Consider gabapentin or lyrica for neuropathic pain     I have personally performed a face to face diagnostic evaluation of this patient. Additionally, I have examined the patient's medical record including any pertinent labs and radiographic images. If the physician assistant has documented in this note, I have reviewed and edited or otherwise concur with the physician assistant's documentation.   Thanks,   ZMeredith Staggers MD 07/30/2022

## 2022-08-01 NOTE — Progress Notes (Signed)
Patient ID: Kristen Bautista, female   DOB: 1955-09-12, 67 y.o.   MRN: [id number]  INPATIENT REHABILITATION ADMISSION NOTE   Arrival Method: Recliner     Mental Orientation: x4   Assessment: see flowsheet   Skin: incision to neck   IV'S: RUE PICC   Pain: None reported   Tubes and Drains: n/a   Safety Measures: in place   Vital Signs: see flowsheet   Height and Weight: see flowsheet   Rehab Orientation: completed   Family: at bedside

## 2022-08-01 NOTE — Progress Notes (Signed)
PMR Admission Coordinator Pre-Admission Assessment   Patient: Kristen Bautista is an 67 y.o., female MRN: [id number] DOB: April 10, 1956 Height: 5' 7"$  (170.2 cm) Weight: 59.6 kg                                                                                                                                                  Insurance Information HMO: yes    PPO:      PCP:      IPA:      80/20:      OTHER:  PRIMARY: [place]      Policy#: [id number]      Subscriber: pt CM Name: Jonelle Sidle      Phone#: [phone] ext [phone]     Fax#: [phone] Pre-Cert#: [id number] [place] for CIR from [place] with updates due to concurrent review RN at fax listed above on 2/28      Employer:  Benefits:  Phone #:      Name:  Eff. Date: 07/12/22     Deduct: $0      Out of Pocket Max: [id number] ($145 met)      Life Max: n/a  CIR: $395/day for days 1-5      SNF: 20 full days Outpatient:      Co-Pay: $40/visit Home Health: 100%      Co-Pay:  DME: 80%     Co-Pay: 20% Providers:  SECONDARY:       Policy#:       Phone#:    Development worker, community:       Phone#:    The Therapist, art Information Summary" for patients in Inpatient Rehabilitation Facilities with attached "Privacy Act [place] Records" was provided and verbally reviewed with: Patient   Emergency Contact Information Contact Information       Name Relation Home Work Mobile    [place] Son [phone]        Javier Docker     [phone]    [place]     [phone]         Current Medical History  Patient Admitting Diagnosis: 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 female without a significant medical history who presented to emergency room at [facility] on 07/26/22 with a 2-week history of worsening neck and back pain as well as paresthesias in the hands and feet.  She denied any fevers or chills.  She says she does drink about 6 beers  per day and smokes half pack daily.  She denies any bowel or bladder changes.  Symptoms usually were worse when she extends her neck.  Patient was seen by neurosurgery.  MRI revealed severe stenosis with cord changes at C3-C4 and questioned epidural abscess.  Patient was started on IV antibiotics.  On 07/28/2022 she underwent  PCDF C3-6 and evacuation of epidural abscess at C3-C4.  Patient currently is on IV vancomycin and ceftriaxone for coverage.  Cultures are pending with NGTD.  ID recommending vancomycin and ceftriaxone.  Possibility of daptomycin at discharge.  Pain predominantly in her neck with some radiation into the arms when she moves her head.    Glasgow Coma Scale Score: 15   Patient's medical record from [facility] has been reviewed by the rehabilitation admission coordinator and physician.   Past Medical History      Past Medical History:  Diagnosis Date   Alcohol abuse     Anemia     Aortic atherosclerosis ([place])     Cannabis abuse     Centrilobular emphysema (HCC)     Cervical myelopathy (HCC)      with BUE paresthesias   Hepatitis-C     HLD (hyperlipidemia)     HTN (hypertension)     Nicotine addiction     Tobacco abuse        Has the patient had major surgery during 100 days prior to admission? Yes   Family History  family history includes High blood pressure in her mother.     Current Medications    Current Facility-Administered Medications:    acetaminophen (TYLENOL) tablet 650 mg, 650 mg, Oral, Q6H PRN, 650 mg at 07/29/22 1845 **OR** acetaminophen (TYLENOL) suppository 650 mg, 650 mg, Rectal, Q6H PRN, Kristopher Oppenheim, DO   amLODipine (NORVASC) tablet 5 mg, 5 mg, Oral, Daily, Cruzita Lederer, Costin M, MD, 5 mg at 08/01/22 0931   cefTRIAXone (ROCEPHIN) 2 g in sodium chloride 0.9 % 100 mL IVPB, 2 g, Intravenous, Q24H, Rolla Flatten, [facility], Last Rate: 200 mL/hr at 08/01/22 0933, 2 g at 08/01/22 [id number]   Chlorhexidine Gluconate Cloth 2 % PADS 6 each, 6 each, Topical, Daily,  Caren Griffins, MD, 6 each at 08/01/22 0931   docusate sodium (COLACE) capsule 100 mg, 100 mg, Oral, BID, Dawley, Troy C, DO, 100 mg at 08/01/22 0931   feeding supplement (ENSURE ENLIVE / ENSURE PLUS) liquid 237 mL, 237 mL, Oral, BID BM, Cruzita Lederer, Costin M, MD, 237 mL at 08/01/22 0931   heparin injection 5,000 Units, 5,000 Units, Subcutaneous, Q8H, Dawley, Troy C, DO   hydrALAZINE (APRESOLINE) injection 5 mg, 5 mg, Intravenous, Q6H PRN, Gherghe, Costin M, MD   menthol-cetylpyridinium (CEPACOL) lozenge 3 mg, 1 lozenge, Oral, PRN **OR** phenol (CHLORASEPTIC) mouth spray 1 spray, 1 spray, Mouth/Throat, PRN, Dawley, Troy C, DO   methocarbamol (ROBAXIN) tablet 500 mg, 500 mg, Oral, Q6H PRN, 500 mg at 07/30/22 1452 **OR** methocarbamol (ROBAXIN) 500 mg in dextrose 5 % 50 mL IVPB, 500 mg, Intravenous, Q6H PRN, Dawley, Troy C, DO   ondansetron (ZOFRAN) tablet 4 mg, 4 mg, Oral, Q6H PRN **OR** ondansetron (ZOFRAN) injection 4 mg, 4 mg, Intravenous, Q6H PRN, Kristopher Oppenheim, DO   Oral care mouth rinse, 15 mL, Mouth Rinse, PRN, Kristopher Oppenheim, DO   oxyCODONE (Oxy IR/ROXICODONE) immediate release tablet 10 mg, 10 mg, Oral, Q3H PRN, Dawley, Troy C, DO, 10 mg at 07/31/22 2119   oxyCODONE (Oxy IR/ROXICODONE) immediate release tablet 5 mg, 5 mg, Oral, Q3H PRN, Dawley, Troy C, DO, 5 mg at 07/29/22 1845   sodium chloride flush (NS) 0.9 % injection 10-40 mL, 10-40 mL, Intracatheter, Q12H, Gherghe, Costin M, MD, 10 mL at 08/01/22 0931   sodium chloride flush (NS) 0.9 % injection 10-40 mL, 10-40 mL, Intracatheter, PRN, Cruzita Lederer, Vella Redhead, MD   vancomycin Alcus Dad)  IVPB 500 mg/100 mL, 500 mg, Intravenous, Q12H, Donnamae Jude, [facility], Last Rate: 100 mL/hr at 08/01/22 0456, 500 mg at 08/01/22 0456   Patients Current Diet:  Diet Order                  Diet regular Room service appropriate? Yes; Fluid consistency: Thin  Diet effective now                         Precautions / Restrictions Precautions Precautions:  Cervical Precaution Booklet Issued: No Precaution Comments: ok for out of brace in bed, for showering, and for short trips to bathroom Cervical Brace: Hard collar, Other (comment) (when OOB) Restrictions Weight Bearing Restrictions: No    Has the patient had 2 or more falls or a fall with injury in the past year?Yes   Prior Activity Level Limited Community (1-2x/wk): independent prior to admit, no DME used, not driving, working PT at the [employer] in [employer]   Prior Functional Level Prior Function Prior Level of Function : Independent/Modified Independent ADLs Comments: 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  Independent   Indoor Mobility: Did the patient need assistance with walking from room to room (with or without device)? Independent   Stairs: Did the patient need assistance with internal or external stairs (with or without device)? Independent   Functional Cognition: Did the patient need help planning regular tasks such as shopping or remembering to take medications? Independent   Patient Information Are you of Hispanic, Latino/a,or Spanish origin?: A. No, not of Hispanic, Latino/a, or Spanish origin What is your race?: B. Black or African American Do you need or want an interpreter to communicate with a doctor or health care staff?: 0. No   Patient's Response To:  Health Literacy and Transportation Is the patient able to respond to health literacy and transportation needs?: Yes Health Literacy - How often do you need to have someone help you when you read instructions, pamphlets, or other written material from your doctor or pharmacy?: Never In the past 12 months, has lack of transportation kept you from medical appointments or from getting medications?: No In the past 12 months, has lack of transportation kept you from meetings, work, or from getting things needed for daily living?:  No   Development worker, international aid / [place] Devices/Equipment: None Home Equipment: Hand held shower head   Prior Device Use: Indicate devices/aids used by the patient prior to current illness, exacerbation or injury? None of the above   Current Functional Level Cognition   Overall Cognitive Status: Within Functional Limits for tasks assessed Orientation Level: Oriented X4 General Comments: Pt very motivated, but anxious re: movement causing increased pain in the back of her neck    Extremity Assessment (includes Sensation/Coordination)   Upper Extremity Assessment:  (same as last session) RUE Deficits / Details: overall 4/5 MMT, reports tingling when seated, and increased numbness with standing. Decreased proprioception, decreased control with pronation RUE Sensation: decreased light touch, decreased proprioception RUE Coordination: decreased fine motor, decreased gross motor LUE Deficits / Details: overall 4/5 MMT, reports tingling when seated, and increased numbness with standing. Decreased proprioception, LUE Sensation: decreased light touch, decreased proprioception LUE Coordination: decreased fine motor, decreased gross motor  Lower Extremity Assessment: Defer to PT evaluation RLE Deficits / Details: strength WFL; ataxic gait pattern with steppage gait, near-scissoring RLE Sensation: decreased light  touch, decreased proprioception LLE Deficits / Details: strength WFL; ataxic gait pattern with steppage gait, near-scissoring LLE Sensation: decreased light touch, decreased proprioception     ADLs   Overall ADL's : Needs assistance/impaired Eating/Feeding: Minimal assistance, With adaptive utensils, Sitting Eating/Feeding Details (indicate cue type and reason): provided red foam handles for self-feeding and education on how to use Grooming: Minimal assistance, Sitting, Wash/dry hands Grooming Details (indicate cue type and reason): increased pain/decreased  sensation Upper Body Bathing: Moderate assistance, Sitting Lower Body Bathing: Maximal assistance, Sitting/lateral leans Upper Body Dressing : Minimal assistance, Sitting Upper Body Dressing Details (indicate cue type and reason): to don extra gown like robe Lower Body Dressing: Maximal assistance Toilet Transfer: +2 for physical assistance, +2 for safety/equipment, Ambulation, Rolling walker (2 wheels), Moderate assistance Toilet Transfer Details (indicate cue type and reason): ataxic gait, struggles to maintain grasp on walker handle on Right Toileting- Clothing Manipulation and Hygiene: Moderate assistance, Sit to/from stand Functional mobility during ADLs: Minimal assistance, +2 for physical assistance, +2 for safety/equipment, Rolling walker (2 wheels) General ADL Comments: decreased sensation and grasp impacting ADL and transfers most today after pain     Mobility   Overal bed mobility: Needs Assistance Bed Mobility: Rolling, Sidelying to Sit Rolling: Supervision Sidelying to sit: Mod assist, +2 for safety/equipment General bed mobility comments: vc for sequencing and raise torso     Transfers   Overall transfer level: Needs assistance Equipment used: Rolling walker (2 wheels) Transfers: Sit to/from Stand Sit to Stand: Min assist, +2 physical assistance General transfer comment: pt anxious re: pain and required physical assist to initiate and then to stabilize upon standing; assist to control descent as she could not feel her hands on the armrests     Ambulation / Gait / Stairs / Wheelchair Mobility   Ambulation/Gait Ambulation/Gait assistance: Mod assist, +2 physical assistance, +2 safety/equipment Gait Distance (Feet): 12 Feet Assistive device: Rolling walker (2 wheels) Gait Pattern/deviations: Step-through pattern, Steppage, Ataxic, Wide base of support General Gait Details: pt with numbness bil feet; 2 person assist due to inability to direct legs in normal gait pattern, Rt  hand coming off RW and needed assist to direct RW; chair brought to pt due to fatigue and incr pain Gait velocity interpretation: 1.31 - 2.62 ft/sec, indicative of limited community ambulator     Posture / Balance Dynamic Sitting Balance Sitting balance - Comments: initial sitting balance poor with pt wanting to lean back against therapist due to neck pain when sitting upright; once support behind her no longer there, she was able to hold herself upright without UE support Balance Overall balance assessment: Needs assistance Sitting-balance support: No upper extremity supported, Feet supported Sitting balance-Leahy Scale: Fair Sitting balance - Comments: initial sitting balance poor with pt wanting to lean back against therapist due to neck pain when sitting upright; once support behind her no longer there, she was able to hold herself upright without UE support Standing balance support: Bilateral upper extremity supported, Reliant on assistive device for balance Standing balance-Leahy Scale: Poor Standing balance comment: initial lean anteriorly and to the right     Special needs/care consideration Skin surgical incision to neck        Previous Home Environment (from acute therapy documentation) Living Arrangements: Alone Available Help at Discharge: Family, Available 24 hours/day (cousin) Type of Home: House (townhouse) Home Layout: One level Home Access: Stairs to enter Entrance Stairs-Rails: Right, Left Entrance Stairs-Number of Steps: 1+1 Bathroom Shower/Tub: Tub/shower unit, Architectural technologist: Standard  Bathroom Accessibility: Yes Home Care Services: No Additional Comments: 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[employer], does not drive   Discharge Living Setting Plans for Discharge Living Setting: Patient's home, Lives with (comment) (cousin has come to stay with patient) Type of Home at Discharge: Apartment Discharge Home Layout: One level Discharge Home Access:  Stairs to enter Entrance Stairs-Rails: Right, Left Entrance Stairs-Number of Steps: 1+1 Discharge Bathroom Shower/Tub: Tub/shower unit (pt bird bathes at baseline) Discharge Bathroom Toilet: Standard Discharge Bathroom Accessibility: Yes How Accessible: Accessible via walker Does the patient have any problems obtaining your medications?: No   Social/Family/Support Systems Anticipated Caregiver: cousin, Winn Jock Hutchinson) Anticipated Caregiver's Contact Information: [phone] Ability/Limitations of Caregiver: none reported Caregiver Availability: 24/7 Discharge Plan Discussed with Primary Caregiver: Yes Is Caregiver In Agreement with Plan?: Yes Does Caregiver/Family have Issues with Lodging/Transportation while Pt is in Rehab?: No     Goals Patient/Family Goal for Rehab: PT/OT supervision to mod I, SLP n/a Expected length of stay: 13-17 days Additional Information: Discharge plan: cousin Loel Lofty to stay with patient (as he was PTA) and can provide assist if needed for ADLs and mobility Pt/Family Agrees to Admission and willing to participate: Yes Program Orientation Provided & Reviewed with Pt/Caregiver Including Roles  & Responsibilities: Yes  Barriers to Discharge: Insurance for SNF coverage     Decrease burden of Care through IP rehab admission: n/a     Possible need for SNF placement upon discharge:Not anticipated.  Pt with support from her cousin Loel Lofty) at discharge.  He will be with her 24/7 and is comfortable providing assist with ADls if needed.      Patient Condition: This patient's condition remains as documented in the consult dated  2/19, in which the Rehabilitation Physician determined and documented that the patient's condition is appropriate for intensive rehabilitative care in an inpatient rehabilitation facility. Will admit to inpatient rehab today.   Preadmission Screen Completed By:  Michel Santee, PT, 08/01/2022 10:34  AM ______________________________________________________________________   Discussed status with Dr. Curlene Dolphin on 08/01/22 at 10:35 AM  and received approval for admission today.   Admission Coordinator:  Michel Santee, PT, DPT time 10:35 AM /Date02/21/24

## 2022-08-01 NOTE — Progress Notes (Signed)
Patient refusing VTE medication of heparin this evening. Not wanting an injection in her abdomen. Explained could use the arm if preferred. Explained about the prophylactic and therapeutic purpose of the medication being an antithrombin to reduce risk of clotting issues. Continued to refuse medication. Patient expressed that has no issues with "clots" and wanting to talk to the Doctor about medication for this. Also, refusing colace medication. Explained to patient the therapeutic purposes of the medication being an emollient laxative and aids with absorption of water in her stools. Discussed with her about bowel history. Endorses during hospital stay decrease output and per flowsheets been having mainly smears. Patient does endorse always having a low appetite and talked about only "eating grill cheeses" in the morning. Also, made aware that decrease in bowel output could potentially be symptom related to current treatment issues. Denies any urge to have bowel movement or any GI issues at this time. Is alert and oriented x 4. On delirium precautions per shift change report. Update accordingly.

## 2022-08-01 NOTE — Progress Notes (Addendum)
Inpatient Rehabilitation Admission Medication Review by a Pharmacist  A complete drug regimen review was completed for this patient to identify any potential clinically significant medication issues.  High Risk Drug Classes Is patient taking? Indication by Medication  Antipsychotic No   Anticoagulant Yes Sq heparin - VTE ppx  Antibiotic Yes, as an intravenous medication Ceftriaxone / Vancomycin until 09/08/22 for cervical epidural abscess  Opioid Yes Oxycodone - prn pain  Antiplatelet No   Hypoglycemics/insulin No   Vasoactive Medication Yes Amlodipine - BP  Chemotherapy No   Other Yes Methocarbamol - prn spasms Zofran - prn N/V     Type of Medication Issue Identified Description of Issue Recommendation(s)  Drug Interaction(s) (clinically significant)     Duplicate Therapy     Allergy     No Medication Administration End Date     Incorrect Dose     Additional Drug Therapy Needed     Significant med changes from prior encounter (inform family/care partners about these prior to discharge).    Other       Clinically significant medication issues were identified that warrant physician communication and completion of prescribed/recommended actions by midnight of the next day:  No  Pharmacist comments: None  Time spent performing this drug regimen review (minutes):  20 minutes  Thank you Anette Guarneri, PharmD

## 2022-08-01 NOTE — Progress Notes (Signed)
Pharmacy Antibiotic Note  Kristen Bautista is a 67 y.o. female admitted on 07/26/2022 with epidural abscess.  Pharmacy has been consulted for vancomycin.  Patient is also on Rocephin.  Renal function stable.  Vancomycin trough is 13 mcg/mL >> estimated AUC ~480 (goal 400-550).    Plan: Continue vanc 531m IV Q12H Continue Rocephin 2gm IV Q24H Monitor renal fxn, clinical progress, weekly vanc levels Possible streamlining abx per ID   Height: 5' 7"$  (170.2 cm) Weight: 59.6 kg (131 lb 6.3 oz) IBW/kg (Calculated) : 61.6  Temp (24hrs), Avg:98.4 F (36.9 C), Min:98.2 F (36.8 C), Max:98.8 F (37.1 C)  Recent Labs  Lab 07/26/22 1811 07/27/22 0029 07/28/22 0243 07/29/22 0454 07/29/22 1059 08/01/22 0322  WBC  --  6.3 5.8 10.5  --   --   CREATININE 0.84  --  1.02* 1.12*  --  0.80  VANCOTROUGH  --   --   --   --  21* 13*  VANCORANDOM  --   --   --  33  --   --      Estimated Creatinine Clearance: 65.1 mL/min (by C-G formula based on SCr of 0.8 mg/dL).    No Known Allergies  Vanc 2/16 >> Zosyn 2/16 >>2/18 CTX 2/18 >>    2/18 VP/VT 33/21 on [id number] >> decr [id number] 2/21 VT 13 (estimated AUC 480) >> no change   2/16 MRSA PCR - negative 2/16 BCx - negative 2/17 cervical tissue - ngtd  Bohdan Macho D. DanMina MarbleharmD, BCPS, [place]21/2024, 10:51 AM
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
## 2022-08-01 NOTE — Progress Notes (Signed)
Inpatient Rehab Admissions Coordinator:    I have insurance approval and a bed available for pt to admit to CIR today. Dr. Eliseo Squires in agreement.  Working on Psychologist, forensic from [employer].  Will let pt/family and TOC team know.   Shann Medal, PT, DPT Admissions Coordinator [phone] 08/01/22  10:33 AM

## 2022-08-01 NOTE — Progress Notes (Signed)
Physical Therapy Treatment Patient Details Name: Kristen Bautista MRN: [id number] DOB: 06-10-1956 Today's Date: 08/01/2022   History of Present Illness 67 year old F without significant medical history presents to the ER 07/26/22 with about a week to 2-week history of worsening neck pain, bilateral arm paresthesias and intermittent paresthesias of her feet. 2-3 days PTA with difficulty standing. 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 2/17 C3-6 PCDF.    PT Comments    Patient continues with severe pain that limits her mobility (in combination with decr safety due to ataxic gait and difficulty grasping RW). Per RN, pt had refused pain meds twice this morning. She told PT she was waiting until I arrived to take pain medication. Discussed that she needs to be taking the pain medication regularly to help minimize her pain. RN made aware that she now wants pain medication. Unable to ambulate as far today due to lack of 2nd person to assist.    Recommendations for follow up therapy are one component of a multi-disciplinary discharge planning process, led by the attending physician.  Recommendations may be updated based on patient status, additional functional criteria and insurance authorization.  Follow Up Recommendations  Acute inpatient rehab (3hours/day)     Assistance Recommended at Discharge Frequent or constant Supervision/Assistance  Patient can return home with the following A little help with walking and/or transfers;A little help with bathing/dressing/bathroom;Assistance with cooking/housework;Assist for transportation;Help with stairs or ramp for entrance   Equipment Recommendations  Rolling walker (2 wheels)    Recommendations for Other Services       Precautions / Restrictions Precautions Precautions: Cervical Precaution Booklet Issued: No Precaution Comments: ok for out of brace in bed, for showering, and for short trips to bathroom Required Braces or Orthoses: Cervical  Brace Cervical Brace: Hard collar;Other (comment) (when OOB) Restrictions Weight Bearing Restrictions: No     Mobility  Bed Mobility Overal bed mobility: Needs Assistance Bed Mobility: Rolling, Sidelying to Sit Rolling: Supervision Sidelying to sit: Mod assist, HOB elevated       General bed mobility comments: vc for sequencing and raise torso; repeated come to sit and return to supported sitting with legs off the bed ~5x due to pt's severe pain when in unsupported sitting    Transfers Overall transfer level: Needs assistance Equipment used: Rolling walker (2 wheels) Transfers: Sit to/from Stand Sit to Stand: Min assist           General transfer comment: pt anxious re: pain and required physical assist to initiate and then to stabilize upon standing; assist to control descent as she could not feel her hands on the armrests    Ambulation/Gait Ambulation/Gait assistance: Mod assist Gait Distance (Feet): 3 Feet Assistive device: Rolling walker (2 wheels) Gait Pattern/deviations: Step-through pattern, Steppage, Ataxic, Wide base of support       General Gait Details: pt with numbness bil feet with  inability to direct legs in normal gait pattern, Rt hand coming off RW and needed assist to direct RW; could not progress gait this date due to lack of 2nd person to assist (only bed to chair for safety)   Stairs             Wheelchair Mobility    Modified Rankin (Stroke Patients Only)       Balance Overall balance assessment: Needs assistance Sitting-balance support: No upper extremity supported, Feet supported Sitting balance-Leahy Scale: Fair Sitting balance - Comments: initial sitting balance poor with pt wanting to lean back against therapist  due to neck pain when sitting upright; once support behind her no longer there, she was able to hold herself upright without UE support   Standing balance support: Bilateral upper extremity supported, Reliant on  assistive device for balance Standing balance-Leahy Scale: Poor Standing balance comment: initial lean anteriorly and to the right                            Cognition Arousal/Alertness: Awake/alert Behavior During Therapy: Anxious (re: pain) Overall Cognitive Status: Impaired/Different from baseline                                 General Comments: Patient reporting RN left her pain medication in a cup and she was waiting to take it when PT arrived. RN reported it was her BP meds and colace and that pt had repeated that back to him, but is now confused.        Exercises General Exercises - Lower Extremity Ankle Circles/Pumps: AROM, Both, 10 reps Long Arc Quad: AROM, Both, 10 reps Hip ABduction/ADduction: AROM, Both, 10 reps, Seated    General Comments        Pertinent Vitals/Pain Pain Assessment Pain Assessment: 0-10 Pain Score: 10-Worst pain ever Pain Location: neck Pain Descriptors / Indicators: Operative site guarding Pain Intervention(s): Limited activity within patient's tolerance, Monitored during session, Patient requesting pain meds-RN notified    Home Living                          Prior Function            PT Goals (current goals can now be found in the care plan section) Acute Rehab PT Goals Patient Stated Goal: return home and return to work Time For Goal Achievement: 08/12/22 Potential to Achieve Goals: Good Progress towards PT goals: Not progressing toward goals - comment (limited by severe pain and lack of +2 assist)    Frequency    Min 5X/week      PT Plan Current plan remains appropriate    Co-evaluation              AM-PAC PT "6 Clicks" Mobility   Outcome Measure  Help needed turning from your back to your side while in a flat bed without using bedrails?: A Little Help needed moving from lying on your back to sitting on the side of a flat bed without using bedrails?: A Lot Help needed moving  to and from a bed to a chair (including a wheelchair)?: A Lot Help needed standing up from a chair using your arms (e.g., wheelchair or bedside chair)?: A Little Help needed to walk in hospital room?: Total Help needed climbing 3-5 steps with a railing? : Total 6 Click Score: 12    End of Session Equipment Utilized During Treatment: Gait belt;Cervical collar Activity Tolerance: Patient limited by pain Patient left: in chair;with call bell/phone within reach;with chair alarm set Nurse Communication: Mobility status;Patient requests pain meds PT Visit Diagnosis: Unsteadiness on feet (R26.81);Muscle weakness (generalized) (M62.81);Ataxic gait (R26.0);Other symptoms and signs involving the nervous system (R29.898)     Time: [id number] PT Time Calculation (min) (ACUTE ONLY): 25 min  Charges:  $Gait Training: 8-22 mins $Therapeutic Exercise: 8-22 mins                      Jeani Hawking  Chauncey Cruel, PT Acute Rehabilitation Services  Office [phone]    Rexanne Mano 08/01/2022, 11:27 AM

## 2022-08-01 NOTE — Progress Notes (Signed)
Bilateral lower extremity venous duplex has been completed. Preliminary results can be found in CV Proc through chart review.   08/01/22 4:30 PM Kristen Bautista RVT

## 2022-08-01 NOTE — TOC Transition Note (Signed)
Transition of Care (TOC) - CM/SW Discharge Note Marvetta Gibbons RN,BSN Transitions of Care Unit 4NP (Non Trauma)- RN Case Manager See Treatment Team for direct Phone #   Patient Details  Name: ZOA FONDREN MRN: [id number] Date of Birth: June 02, 1956  Transition of Care [facility]) CM/SW Contact:  Dawayne Patricia, RN Phone Number: 08/01/2022, 4:30 PM   Clinical Narrative:    CM has been notified by CIR liaison that pt has insurance approval and bed available today. Attending MD to follow up w/ ID for IV abx recommendations. Pt will need longterm abx.   CM reached out to [facility] with [employer] who has been following for home IV abx needs- Pam will continue to follow pt in CIR for abx needs when ready to transition home from rehab.   Pt has been cleared to transition to [place] [facility] at [facility] later today.    Final next level of care: IP Rehab Facility Barriers to Discharge: Barriers Resolved   Patient Goals and CMS Choice CMS Medicare.gov Compare Post Acute Care list provided to:: Patient Choice offered to / list presented to : Patient  Discharge Placement                 [facility]        Discharge Plan and Services Additional resources added to the After Visit Summary for       Post Acute Care Choice: IP Rehab          DME Arranged: N/A DME Agency: NA       HH Arranged: NA HH Agency: NA        Social Determinants of Health (SDOH) Interventions SDOH Screenings   Food Insecurity: No Food Insecurity (07/29/2022)  Housing: Low Risk  (07/29/2022)  Transportation Needs: No Transportation Needs (07/29/2022)  Utilities: Not At Risk (07/29/2022)  Tobacco Use: High Risk (08/01/2022)     Readmission Risk Interventions     No data to display
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
## 2022-08-01 NOTE — Progress Notes (Incomplete)
OPAT - insurance

## 2022-08-02 DIAGNOSIS — G061 Intraspinal abscess and granuloma: Secondary | ICD-10-CM | POA: Diagnosis not present

## 2022-08-02 DIAGNOSIS — K5901 Slow transit constipation: Secondary | ICD-10-CM

## 2022-08-02 DIAGNOSIS — G062 Extradural and subdural abscess, unspecified: Secondary | ICD-10-CM

## 2022-08-02 DIAGNOSIS — I10 Essential (primary) hypertension: Secondary | ICD-10-CM | POA: Diagnosis not present

## 2022-08-02 DIAGNOSIS — G959 Disease of spinal cord, unspecified: Secondary | ICD-10-CM | POA: Diagnosis not present

## 2022-08-02 LAB — COMPREHENSIVE METABOLIC PANEL
ALT: 28 U/L (ref 0–44)
AST: 41 U/L (ref 15–41)
Albumin: 2.6 g/dL — ABNORMAL LOW (ref 3.5–5.0)
Alkaline Phosphatase: 68 U/L (ref 38–126)
Anion gap: 9 (ref 5–15)
BUN: 16 mg/dL (ref 8–23)
CO2: 27 mmol/L (ref 22–32)
Calcium: 9.5 mg/dL (ref 8.9–10.3)
Chloride: 103 mmol/L (ref 98–111)
Creatinine, Ser: 0.71 mg/dL (ref 0.44–1.00)
GFR, Estimated: >60 mL/min (ref >60)
Glucose, Bld: 101 mg/dL — ABNORMAL HIGH (ref 70–99)
Potassium: 3.5 mmol/L (ref 3.5–5.1)
Sodium: 139 mmol/L (ref 135–145)
Total Bilirubin: 0.8 mg/dL (ref 0.3–1.2)
Total Protein: 7.4 g/dL (ref 6.5–8.1)

## 2022-08-02 LAB — CBC WITH DIFFERENTIAL/PLATELET
Abs Immature Granulocytes: 0.03 10*3/uL (ref 0.00–0.07)
Basophils Absolute: 0 10*3/uL (ref 0.0–0.1)
Basophils Relative: 0 %
Eosinophils Absolute: 0.2 10*3/uL (ref 0.0–0.5)
Eosinophils Relative: 2 %
HCT: 32 % — ABNORMAL LOW (ref 36.0–46.0)
Hemoglobin: 10.3 g/dL — ABNORMAL LOW (ref 12.0–15.0)
Immature Granulocytes: 0 %
Lymphocytes Relative: 26 %
Lymphs Abs: 2.2 10*3/uL (ref 0.7–4.0)
MCH: 28.5 pg (ref 26.0–34.0)
MCHC: 32.2 g/dL (ref 30.0–36.0)
MCV: 88.4 fL (ref 80.0–100.0)
Monocytes Absolute: 0.9 10*3/uL (ref 0.1–1.0)
Monocytes Relative: 11 %
Neutro Abs: 5.2 10*3/uL (ref 1.7–7.7)
Neutrophils Relative %: 61 %
Platelets: 372 10*3/uL (ref 150–400)
RBC: 3.62 MIL/uL — ABNORMAL LOW (ref 3.87–5.11)
RDW: 12.2 % (ref 11.5–15.5)
WBC: 8.5 10*3/uL (ref 4.0–10.5)
nRBC: 0 % (ref 0.0–0.2)

## 2022-08-02 LAB — AEROBIC/ANAEROBIC CULTURE W GRAM STAIN (SURGICAL/DEEP WOUND)
Culture: NO GROWTH
Gram Stain: NONE SEEN

## 2022-08-02 MED ORDER — ENSURE MAX PROTEIN PO LIQD
11.0000 [oz_av] | Freq: Every day | ORAL | Status: DC
  Administered 2022-08-02 – 2022-08-23 (×20): 11 [oz_av] via ORAL

## 2022-08-02 MED ORDER — ENOXAPARIN SODIUM 40 MG/0.4ML IJ SOSY
40.0000 mg | PREFILLED_SYRINGE | INTRAMUSCULAR | Status: DC
  Administered 2022-08-02 – 2022-08-23 (×22): 40 mg via SUBCUTANEOUS
  Filled 2022-08-02 (×22): qty 0.4

## 2022-08-02 MED ORDER — SENNOSIDES-DOCUSATE SODIUM 8.6-50 MG PO TABS
2.0000 | ORAL_TABLET | Freq: Every day | ORAL | Status: DC
  Administered 2022-08-03 – 2022-08-22 (×18): 2 via ORAL
  Filled 2022-08-02 (×20): qty 2

## 2022-08-02 MED ORDER — SODIUM CHLORIDE 0.9 % IV SOLN: INTRAVENOUS | Status: DC | PRN

## 2022-08-02 NOTE — Progress Notes (Signed)
Inpatient Rehabilitation  Patient information reviewed and entered into eRehab system by My Rinke M. Matayah Reyburn, M.A., CCC/SLP, PPS Coordinator.  Information including medical coding, functional ability and quality indicators will be reviewed and updated through discharge.    

## 2022-08-02 NOTE — Progress Notes (Signed)
Patient ID: Kristen Bautista, female   DOB: 1956-01-01, 67 y.o.   MRN: [id number]  SW went by pt room to complete assessment, but pt sleeping. Continued efforts will be made to complete.   [id number] left message for pt cousin Winn Jock [phone]) to introduce self, explain role, and discuss discharge process. SW waiting on follow-up.    Loralee Pacas, MSW, [place] Office: [phone] Cell: [phone] Fax: [phone]

## 2022-08-02 NOTE — Progress Notes (Signed)
PHARMACY CONSULT NOTE FOR:  OUTPATIENT  PARENTERAL ANTIBIOTIC THERAPY (OPAT)  Indication: Cervical epidural abscess Regimen: Rocephin 2g IV every 24 hours and Vancomycin 500 mg IV every 12 hours  End date: 09/08/22 (6 weeks from OR 07/28/22)  IV antibiotic discharge orders are pended. To discharging provider:  please sign these orders via discharge navigator,  Select New Orders & click on the button choice - Manage This Unsigned Work.     Thank you for allowing pharmacy to be a part of this patient's care.  Alycia Rossetti, PharmD, BCPS Infectious Diseases Clinical Pharmacist 08/03/2022 1:10 PM   **Pharmacist phone directory can now be found on [email or web address] ([email or web address]).  Listed under [place].

## 2022-08-02 NOTE — Plan of Care (Signed)
  Problem: RH Balance Goal: LTG Patient will maintain dynamic standing with ADLs (OT) Description: LTG:  Patient will maintain dynamic standing balance with assist during activities of daily living (OT)  Flowsheets (Taken 08/02/2022 1524) LTG: Pt will maintain dynamic standing balance during ADLs with: Supervision/Verbal cueing   Problem: Sit to Stand Goal: LTG:  Patient will perform sit to stand in prep for activites of daily living with assistance level (OT) Description: LTG:  Patient will perform sit to stand in prep for activites of daily living with assistance level (OT) Flowsheets (Taken 08/02/2022 1524) LTG: PT will perform sit to stand in prep for activites of daily living with assistance level: Supervision/Verbal cueing   Problem: RH Eating Goal: LTG Patient will perform eating w/assist, cues/equip (OT) Description: LTG: Patient will perform eating with assist, with/without cues using equipment (OT) Flowsheets (Taken 08/02/2022 1524) LTG: Pt will perform eating with assistance level of: Supervision/Verbal cueing   Problem: RH Grooming Goal: LTG Patient will perform grooming w/assist,cues/equip (OT) Description: LTG: Patient will perform grooming with assist, with/without cues using equipment (OT) Flowsheets (Taken 08/02/2022 1524) LTG: Pt will perform grooming with assistance level of: Supervision/Verbal cueing   Problem: RH Bathing Goal: LTG Patient will bathe all body parts with assist levels (OT) Description: LTG: Patient will bathe all body parts with assist levels (OT) Flowsheets (Taken 08/02/2022 1524) LTG: Pt will perform bathing with assistance level/cueing: Supervision/Verbal cueing   Problem: RH Dressing Goal: LTG Patient will perform upper body dressing (OT) Description: LTG Patient will perform upper body dressing with assist, with/without cues (OT). Flowsheets (Taken 08/02/2022 1524) LTG: Pt will perform upper body dressing with assistance level of:  Supervision/Verbal cueing Goal: LTG Patient will perform lower body dressing w/assist (OT) Description: LTG: Patient will perform lower body dressing with assist, with/without cues in positioning using equipment (OT) Flowsheets (Taken 08/02/2022 1524) LTG: Pt will perform lower body dressing with assistance level of: Supervision/Verbal cueing   Problem: RH Toileting Goal: LTG Patient will perform toileting task (3/3 steps) with assistance level (OT) Description: LTG: Patient will perform toileting task (3/3 steps) with assistance level (OT)  Flowsheets (Taken 08/02/2022 1524) LTG: Pt will perform toileting task (3/3 steps) with assistance level: Minimal Assistance - Patient > 75%   Problem: RH Toilet Transfers Goal: LTG Patient will perform toilet transfers w/assist (OT) Description: LTG: Patient will perform toilet transfers with assist, with/without cues using equipment (OT) Flowsheets (Taken 08/02/2022 1524) LTG: Pt will perform toilet transfers with assistance level of: Supervision/Verbal cueing   Problem: RH Tub/Shower Transfers Goal: LTG Patient will perform tub/shower transfers w/assist (OT) Description: LTG: Patient will perform tub/shower transfers with assist, with/without cues using equipment (OT) Flowsheets (Taken 08/02/2022 1524) LTG: Pt will perform tub/shower stall transfers with assistance level of: Minimal Assistance - Patient > 75%
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
## 2022-08-02 NOTE — Progress Notes (Signed)
Physical Therapy Session Note  Patient Details  Name: Kristen Bautista MRN: [id number] Date of Birth: [birth year]  Today's Date: 08/02/2022 PT Individual Time: [id number] PT Individual Time Calculation (min): 85 min   Short Term Goals: Week 1:  PT Short Term Goal 1 (Week 1): pt will sit EOB with CGA or better PT Short Term Goal 2 (Week 1): Pt will perform STS with mod A consistently PT Short Term Goal 3 (Week 1): Pt will tolerate sitting OOB between sessions for improved activity tolerance  Skilled Therapeutic Interventions/Progress Updates:  Pt received in recliner and agreeable to therapy.  Pt reports 7/10 pain, premedicated. Rest and positioning provided as needed. Limited participation d/t pain. Pt requesting to toilet, so therapist set up [place]. Pt took several attempts to maintain sitting upright at edge of recliner long enough to attempt stand. Pt able to stand with min to power up but max-tot for balance with RW, and unable to follow cues for pushing from arm of chair. Max A for balance while performing Stand pivot transfer to [facility], tot A for clothing and hygiene. No void, even with extended time.  Stand pivot transfer back to recliner with RW. Pt requires max-tot A for balance with RW and is able to step but limited by extensor tone and balance. Provided education on these topics and strategies for improvement during rest breaks. Pt practiced scooting forward and back in recliner using lateral leans but declined help from therapist. Pt able to sit upright without back support up to 30 sec before needing to lean back, pt reports pain more limiting than fatigue. After discussion with RN about safety plan, therapist returned to room to trial stedy. Pt able to stand to stedy with +2 assist for balance. Dependent transfer to bed with +2 stedy. Max A for cueing and BLE management sit>supine. Pt remained in bed and was left with all needs in reach and alarm active.   Therapy Documentation Precautions:   Precautions Precautions: Cervical Precaution Comments: ok for out of brace in bed, for showering, and for short trips to bathroom Required Braces or Orthoses: Cervical Brace Cervical Brace: Hard collar, Other (comment) Restrictions Weight Bearing Restrictions: No General:   Vital Signs:  Pain: Pain Assessment Pain Scale: 0-10 Pain Score: Asleep Pain Type: Acute pain;Surgical pain Pain Location: Neck Pain Orientation: Posterior Pain Descriptors / Indicators: Aching;Crying;Discomfort Pain Frequency: Constant Pain Onset: On-going Patients Stated Pain Goal: 3 Pain Intervention(s): Medication (See eMAR) Mobility: Bed Mobility Bed Mobility: Supine to Sit;Sit to Supine Supine to Sit: Maximal Assistance - Patient - Patient 25-49% Sit to Supine: Maximal Assistance - Patient 25-49% Transfers Transfers: Sit to Stand;Stand to Sit;Stand Pivot Transfers Sit to Stand: Maximal Assistance - Patient 25-49% Stand to Sit: Moderate Assistance - Patient 50-74% Stand Pivot Transfers: Maximal Assistance - Patient 25 - 49% Transfer (Assistive device): Rolling walker Locomotion : Gait Ambulation: No Gait Gait: No Stairs / Additional Locomotion Stairs: No Wheelchair Mobility Wheelchair Mobility: No  Trunk/Postural Assessment : Cervical Assessment Cervical Assessment: Exceptions to [facility] (cervical collar) Thoracic Assessment Thoracic Assessment: Within Functional Limits Lumbar Assessment Lumbar Assessment: Within Functional Limits Postural Control Postural Control: Deficits on evaluation Trunk Control: poor; strong posterior lean requiring max A to maintain balance ; also impacted by signicant pain Righting Reactions: significantly delayed to abset  Balance: Balance Balance Assessed: Yes Static Sitting Balance Static Sitting - Balance Support: Feet supported Static Sitting - Level of Assistance: 3: Mod assist Dynamic Sitting Balance Dynamic Sitting - Balance Support: During  functional activity Dynamic Sitting - Level of Assistance: 3: Mod assist Static Standing Balance Static Standing - Balance Support: During functional activity Static Standing - Level of Assistance: 3: Mod assist;2: Max assist Dynamic Standing Balance Dynamic Standing - Level of Assistance: 2: Max assist Exercises:   Other Treatments:      Therapy/Group: Individual Therapy  Mickel Fuchs 08/02/2022, 2:22 PM

## 2022-08-02 NOTE — Progress Notes (Signed)
[place] for Infectious Disease  Date of Admission:  08/01/2022           Reason for visit: Follow up on epidural abscess  Current antibiotics: Vancomycin Ceftriaxone   ASSESSMENT:    67 y.o. female admitted with:  Cervical stenosis, myelopathy: Status post OR 07/28/2022 with neurosurgery for C3-6 posterior cervical decompression, instrumented fusion.  Concern intraoperatively for epidural abscess and operative cultures finalized as no growth.  RECOMMENDATIONS:    Continue antibiotics and planning for 6 weeks IV therapy via PICC line from date of her surgery End date of IV antibiotics = 09/08/22 Working with insurance on daptomycin approvals Currently on vancomycin and ceftriaxone  Following    Principal Problem:   Cervical myelopathy ([place])    MEDICATIONS:    Scheduled Meds:  amLODipine  5 mg Oral Daily   Chlorhexidine Gluconate Cloth  6 each Topical Q12H   docusate sodium  100 mg Oral BID   feeding supplement  237 mL Oral BID BM   heparin injection (subcutaneous)  5,000 Units Subcutaneous Q8H   polyethylene glycol  17 g Oral Daily   Continuous Infusions:  cefTRIAXone (ROCEPHIN)  IV     methocarbamol (ROBAXIN) IV     vancomycin Stopped (08/01/22 2211)   PRN Meds:.acetaminophen **OR** acetaminophen, methocarbamol **OR** methocarbamol (ROBAXIN) IV, ondansetron **OR** ondansetron (ZOFRAN) IV, oxyCODONE, oxyCODONE, sodium chloride flush, sorbitol  SUBJECTIVE:   24 hour events:  Patient admitted to CIR yesterday Her operative cultures remain no growth Blood cultures finalized as negative  She complains of neck pain when using the bathroom or trying to get out of bed.   Review of Systems  All other systems reviewed and are negative.     OBJECTIVE:   Blood pressure 131/77, pulse (!) 104, temperature 98.9 F (37.2 C), temperature source Oral, resp. rate 18, height 5' 7"$  (1.702 m), weight 55.7 kg, SpO2 100 %. Body mass index is 19.23  kg/m.  Physical Exam Constitutional:      General: She is not in acute distress.    Appearance: Normal appearance.  HENT:     Head: Normocephalic and atraumatic.  Eyes:     Extraocular Movements: Extraocular movements intact.     Conjunctiva/sclera: Conjunctivae normal.  Neck:     Comments: She is in a cervical collar.  Abdominal:     General: There is no distension.     Palpations: Abdomen is soft.  Skin:    General: Skin is warm and dry.  Neurological:     General: No focal deficit present.     Mental Status: She is alert and oriented to person, place, and time.  Psychiatric:        Mood and Affect: Mood normal.        Behavior: Behavior normal.      Lab Results: Lab Results  Component Value Date   WBC 8.5 08/02/2022   HGB 10.3 (L) 08/02/2022   HCT 32.0 (L) 08/02/2022   MCV 88.4 08/02/2022   PLT 372 08/02/2022    Lab Results  Component Value Date   NA 139 08/02/2022   K 3.5 08/02/2022   CO2 27 08/02/2022   GLUCOSE 101 (H) 08/02/2022   BUN 16 08/02/2022   CREATININE 0.71 08/02/2022   CALCIUM 9.5 08/02/2022   GFRNONAA >60 08/02/2022   GFRAA 50 (L) 06/20/2015    Lab Results  Component Value Date   ALT 28 08/02/2022   AST 41 08/02/2022   ALKPHOS 68  08/02/2022   BILITOT 0.8 08/02/2022       Component Value Date/Time   CRP 11.4 (H) 07/31/2022 0434       Component Value Date/Time   ESRSEDRATE 104 (H) 07/31/2022 0434     I have reviewed the micro and lab results in Epic.  Imaging: VAS [place] LOWER EXTREMITY VENOUS (DVT)  Result Date: 08/01/2022  Lower Venous DVT Study Patient Name:  Kristen Bautista  Date of Exam:   08/01/2022 Medical Rec #: [id number]       Accession #:    [id number] Date of Birth: May 01, 1956       Patient Gender: F Patient Age:   70 years Exam Location[facility]  [facility] Procedure:      VAS [place] LOWER EXTREMITY VENOUS (DVT) Referring Phys: Lauraine Rinne --------------------------------------------------------------------------------   Indications: Swelling.  Risk Factors: None identified. Comparison Study: No prior studies. Performing Technologist: Oliver Hum RVT  Examination Guidelines: A complete evaluation includes B-mode imaging, spectral Doppler, color Doppler, and power Doppler as needed of all accessible portions of each vessel. Bilateral testing is considered an integral part of a complete examination. Limited examinations for reoccurring indications may be performed as noted. The reflux portion of the exam is performed with the patient in reverse Trendelenburg.  +---------+---------------+---------+-----------+----------+--------------+ RIGHT    CompressibilityPhasicitySpontaneityPropertiesThrombus Aging +---------+---------------+---------+-----------+----------+--------------+ CFV      Full           Yes      Yes                                 +---------+---------------+---------+-----------+----------+--------------+ SFJ      Full                                                        +---------+---------------+---------+-----------+----------+--------------+ FV Prox  Full                                                        +---------+---------------+---------+-----------+----------+--------------+ FV Mid   Full                                                        +---------+---------------+---------+-----------+----------+--------------+ FV DistalFull                                                        +---------+---------------+---------+-----------+----------+--------------+ PFV      Full                                                        +---------+---------------+---------+-----------+----------+--------------+ POP      Full  Yes      Yes                                 +---------+---------------+---------+-----------+----------+--------------+ PTV      Full                                                         +---------+---------------+---------+-----------+----------+--------------+ PERO     Full                                                        +---------+---------------+---------+-----------+----------+--------------+   +---------+---------------+---------+-----------+----------+--------------+ LEFT     CompressibilityPhasicitySpontaneityPropertiesThrombus Aging +---------+---------------+---------+-----------+----------+--------------+ CFV      Full           Yes      Yes                                 +---------+---------------+---------+-----------+----------+--------------+ SFJ      Full                                                        +---------+---------------+---------+-----------+----------+--------------+ FV Prox  Full                                                        +---------+---------------+---------+-----------+----------+--------------+ FV Mid   Full                                                        +---------+---------------+---------+-----------+----------+--------------+ FV DistalFull                                                        +---------+---------------+---------+-----------+----------+--------------+ PFV      Full                                                        +---------+---------------+---------+-----------+----------+--------------+ POP      Full           Yes      Yes                                 +---------+---------------+---------+-----------+----------+--------------+ PTV  Full                                                        +---------+---------------+---------+-----------+----------+--------------+ PERO     Full                                                        +---------+---------------+---------+-----------+----------+--------------+     Summary: RIGHT: - There is no evidence of deep vein thrombosis in the lower extremity.  - No cystic structure found in  the popliteal fossa.  LEFT: - There is no evidence of deep vein thrombosis in the lower extremity.  - No cystic structure found in the popliteal fossa.  *See table(s) above for measurements and observations. Electronically signed by Jamelle Haring on 08/01/2022 at 10:07:53 PM.    Final      Imaging independently reviewed in Epic.    Raynelle Highland for Infectious Disease [facility] [phone] pager 08/02/2022, 8:42 AM
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
## 2022-08-02 NOTE — Discharge Instructions (Addendum)
Inpatient Rehab Discharge Instructions  Kristen Bautista Discharge date and time: No discharge date for patient encounter.   Activities/Precautions/ Functional Status: Activity:  Cervical collar as directed when out of bed Diet: regular diet Wound Care: Routine skin checks Functional status:  ___ No restrictions     ___ Walk up steps independently ___ 24/7 supervision/assistance   ___ Walk up steps with assistance ___ Intermittent supervision/assistance  ___ Bathe/dress independently ___ Walk with walker     _x__ Bathe/dress with assistance ___ Walk Independently    ___ Shower independently ___ Walk with assistance    ___ Shower with assistance ___ No alcohol     ___ Return to work/school ________  Special Instructions: No driving smoking or alcohol  Continue intravenous Rocephin 2 g daily as well as vancomycin 500 mg every 12 hours through 09/08/2022 and stop  COMMUNITY REFERRALS UPON DISCHARGE:    Home Health:   PT      OT                      Agency:[facility]    [phone] *Please expect follow-up within 7-10 business days to schedule your appointment. If you have not received follow-up, be sure to contact the site directly.*    Medical Equipment/Items Ordered:rollator and 3in1 bedside commode                                                 Agency/Supplier:[employer] # [phone]     My questions have been answered and I understand these instructions. I will adhere to these goals and the provided educational materials after my discharge from the hospital.  Patient/Caregiver Signature _______________________________ Date __________  Clinician Signature _______________________________________ Date __________  Please bring this form and your medication list with you to all your follow-up doctor's appointments.

## 2022-08-02 NOTE — Progress Notes (Signed)
PROGRESS NOTE   Subjective/Complaints: C/o neck pain. Asked why she needs "shots for blood clots if I don't have one?" Doesn't want them in abdomen either. Says she hasn't moved bowels since she's been here yet is refusing colace too. Doesn't like meats. "Maybe I am a vegetarian."   ROS: Patient denies fever, rash, sore throat, blurred vision, dizziness, nausea, vomiting, diarrhea, cough, shortness of breath or chest pain,  headache, or mood change.    Objective:   VAS [place] LOWER EXTREMITY VENOUS (DVT)  Result Date: 08/01/2022  Lower Venous DVT Study Patient Name:  Kristen Bautista  Date of Exam:   08/01/2022 Medical Rec #: [id number]       Accession #:    [id number] Date of Birth: Sep 01, 1955       Patient Gender: F Patient Age:   67 years Exam Location:  [facility] Procedure:      VAS [place] LOWER EXTREMITY VENOUS (DVT) Referring Phys: Lauraine Rinne --------------------------------------------------------------------------------  Indications: Swelling.  Risk Factors: None identified. Comparison Study: No prior studies. Performing Technologist: Oliver Hum RVT  Examination Guidelines: A complete evaluation includes B-mode imaging, spectral Doppler, color Doppler, and power Doppler as needed of all accessible portions of each vessel. Bilateral testing is considered an integral part of a complete examination. Limited examinations for reoccurring indications may be performed as noted. The reflux portion of the exam is performed with the patient in reverse Trendelenburg.  +---------+---------------+---------+-----------+----------+--------------+ RIGHT    CompressibilityPhasicitySpontaneityPropertiesThrombus Aging +---------+---------------+---------+-----------+----------+--------------+ CFV      Full           Yes      Yes                                 +---------+---------------+---------+-----------+----------+--------------+  SFJ      Full                                                        +---------+---------------+---------+-----------+----------+--------------+ FV Prox  Full                                                        +---------+---------------+---------+-----------+----------+--------------+ FV Mid   Full                                                        +---------+---------------+---------+-----------+----------+--------------+ FV DistalFull                                                        +---------+---------------+---------+-----------+----------+--------------+  PFV      Full                                                        +---------+---------------+---------+-----------+----------+--------------+ POP      Full           Yes      Yes                                 +---------+---------------+---------+-----------+----------+--------------+ PTV      Full                                                        +---------+---------------+---------+-----------+----------+--------------+ PERO     Full                                                        +---------+---------------+---------+-----------+----------+--------------+   +---------+---------------+---------+-----------+----------+--------------+ LEFT     CompressibilityPhasicitySpontaneityPropertiesThrombus Aging +---------+---------------+---------+-----------+----------+--------------+ CFV      Full           Yes      Yes                                 +---------+---------------+---------+-----------+----------+--------------+ SFJ      Full                                                        +---------+---------------+---------+-----------+----------+--------------+ FV Prox  Full                                                        +---------+---------------+---------+-----------+----------+--------------+ FV Mid   Full                                                         +---------+---------------+---------+-----------+----------+--------------+ FV DistalFull                                                        +---------+---------------+---------+-----------+----------+--------------+ PFV      Full                                                        +---------+---------------+---------+-----------+----------+--------------+  POP      Full           Yes      Yes                                 +---------+---------------+---------+-----------+----------+--------------+ PTV      Full                                                        +---------+---------------+---------+-----------+----------+--------------+ PERO     Full                                                        +---------+---------------+---------+-----------+----------+--------------+     Summary: RIGHT: - There is no evidence of deep vein thrombosis in the lower extremity.  - No cystic structure found in the popliteal fossa.  LEFT: - There is no evidence of deep vein thrombosis in the lower extremity.  - No cystic structure found in the popliteal fossa.  *See table(s) above for measurements and observations. Electronically signed by Jamelle Haring on 08/01/2022 at 10:07:53 PM.    Final    Recent Labs    08/01/22 2108 08/02/22 0448  WBC 8.9 8.5  HGB 10.5* 10.3*  HCT 31.1* 32.0*  PLT 348 372   Recent Labs    08/01/22 0322 08/01/22 2108 08/02/22 0448  NA 137  --  139  K 3.8  --  3.5  CL 101  --  103  CO2 27  --  27  GLUCOSE 96  --  101*  BUN 16  --  16  CREATININE 0.80 0.71 0.71  CALCIUM 9.5  --  9.5    Intake/Output Summary (Last 24 hours) at 08/02/2022 0842 Last data filed at 08/02/2022 0723 Gross per 24 hour  Intake 347.42 ml  Output --  Net 347.42 ml        Physical Exam: Vital Signs Blood pressure 131/77, pulse (!) 104, temperature 98.9 F (37.2 C), temperature source Oral, resp. rate 18, height 5' 7"$  (1.702 m),  weight 55.7 kg, SpO2 100 %.  General: Alert and oriented x 3, No apparent distress HEENT: Head is normocephalic, atraumatic, PERRLA, EOMI, sclera anicteric, oral mucosa pink and moist, dentition intact, ext ear canals clear,  Neck: in cervical collar.  Heart: Reg rate and rhythm. No murmurs rubs or gallops Chest: CTA bilaterally without wheezes, rales, or rhonchi; no distress Abdomen: Soft, non-tender, non-distended, bowel sounds positive. Extremities: No clubbing, cyanosis, or edema. Pulses are 2+ Psych: Pt's affect is appropriate. Pt is cooperative Skin: Clean and intact without signs of breakdown. Wound cdi Neuro:  Alert and oriented x 3. Fair insight and awareness. Functional Memory. Normal language and speech. Cranial nerve exam unremarkable. UE motor 3 prox to 4/5 distally with decreased LT at finger tips bilaterally. LE 3+ prox to 4/5 distally with decreased LT below ankle both feet.  Musculoskeletal: neck tender even in collar with basic head movement and bed mobility    Assessment/Plan: 1. Functional deficits which require 3+ hours per day of interdisciplinary therapy in a comprehensive inpatient rehab setting. Physiatrist  is providing close team supervision and 24 hour management of active medical problems listed below. Physiatrist and rehab team continue to assess barriers to discharge/monitor patient progress toward functional and medical goals  Care Tool:  Bathing              Bathing assist       Upper Body Dressing/Undressing Upper body dressing        Upper body assist      Lower Body Dressing/Undressing Lower body dressing            Lower body assist       Toileting Toileting    Toileting assist       Transfers Chair/bed transfer  Transfers assist           Locomotion Ambulation   Ambulation assist              Walk 10 feet activity   Assist           Walk 50 feet activity   Assist           Walk 150 feet  activity   Assist           Walk 10 feet on uneven surface  activity   Assist           Wheelchair     Assist               Wheelchair 50 feet with 2 turns activity    Assist            Wheelchair 150 feet activity     Assist          Blood pressure 131/77, pulse (!) 104, temperature 98.9 F (37.2 C), temperature source Oral, resp. rate 18, height 5' 7"$  (1.702 m), weight 55.7 kg, SpO2 100 %.  Medical Problem List and Plan: 1. Functional deficits secondary to severe cervical stenosis with myelopathy/epidural abscess.  Status post open posterior cervical lateral instrumentation posterior lateral fusion C3-4 4-5 C5-6 as well as laminotomies evacuation of epidural abscess C3-4 07/28/2022.  Cervical collar when out of bed.             -patient may not shower             -ELOS/Goals: 13 to 17 days, modified independent and supervision with PT and OT             --Patient is beginning CIR therapies today including PT and OT             2.  Antithrombotics: -DVT/anticoagulation:  Pharmaceutical: change heparin to sq lovenox 45m daily to decreased sticks             -antiplatelet therapy: N/A 3. Pain Management: Oxycodone/Robaxin as needed 4. Mood/Behavior/Sleep: Provide emotional support             -antipsychotic agents: N/A 5. Neuropsych/cognition: This patient is capable of making decisions on her own behalf. 6. Skin/Wound Care: Routine skin checks 7. Fluids/Electrolytes/Nutrition: encourage po -add protein supplement. 8.  ID.  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  Currently on vancomycin and ceftriaxone until 09/08/2022.   9.  Hypertension.  Norvasc 5 mg daily.     -controlled 10.  History of alcohol/tobacco/cannabis use.  Provide counseling, monitor for withdrawal 11.  Slow transit Constipation/neurogenic bowel: pt has flatus, intake minimal over last few days.  -encouraged po intake. Will ask RD to see  -change colace to senna-s at bedtime              -  continue daily miralax - sorbitol as needed 12.  Aortic atherosclerosis             -Consider resume aspirin and atorvastatin on discharge    LOS: 1 days A FACE TO FACE EVALUATION WAS PERFORMED  Meredith Staggers 08/02/2022, 8:42 AM

## 2022-08-03 MED ORDER — MEGESTROL ACETATE 400 MG/10ML PO SUSP
400.0000 mg | Freq: Two times a day (BID) | ORAL | Status: DC
  Administered 2022-08-03 – 2022-08-23 (×41): 400 mg via ORAL
  Filled 2022-08-03 (×43): qty 10

## 2022-08-03 MED ORDER — ADULT MULTIVITAMIN W/MINERALS CH
1.0000 | ORAL_TABLET | Freq: Every day | ORAL | Status: DC
  Administered 2022-08-03 – 2022-08-23 (×21): 1 via ORAL
  Filled 2022-08-03 (×21): qty 1

## 2022-08-03 NOTE — IPOC Note (Signed)
Overall Plan of Care [facility]) Patient Details Name: Kristen Bautista MRN: [id number] DOB: [birth year]  Admitting Diagnosis: Cervical myelopathy [facility])  Hospital Problems: Principal Problem:   Cervical myelopathy ([place])     Functional Problem List: Nursing Pain, Bladder, Bowel, Safety, Sensory, Endurance, Medication Management, Motor  PT Balance, Pain, Safety, Endurance, Sensory, Motor, Skin Integrity  OT Balance, Sensory, Skin Integrity, Endurance, Motor, Pain, Perception  SLP    TR         Basic ADL's: OT Eating, Grooming, Bathing, Dressing, Toileting     Advanced  ADL's: OT       Transfers: PT Bed Mobility, Bed to Chair, Car, Manufacturing systems engineer, Metallurgist: PT Ambulation, Emergency planning/management officer, Stairs     Additional Impairments: OT Fuctional Use of Upper Extremity  SLP        TR      Anticipated Outcomes Item Anticipated Outcome  Self Feeding supervision  Swallowing      Basic self-care  supervision  Toileting  supervision   Bathroom Transfers supervision  Bowel/Bladder  continent B/B  Transfers  supervision with LRAD  Locomotion  min A with LRAD  Communication     Cognition     Pain  less than 4  Safety/Judgment  incision remain CDI   Therapy Plan: PT Intensity: Minimum of 1-2 x/day ,45 to 90 minutes PT Frequency: 5 out of 7 days PT Duration Estimated Length of Stay: 2-3 weeks OT Intensity: Minimum of 1-2 x/day, 45 to 90 minutes OT Frequency: 5 out of 7 days OT Duration/Estimated Length of Stay: ~14-20 days     Team Interventions: Nursing Interventions Patient/Family Education, Pain Management, Bladder Management, Medication Management, Discharge Planning, Bowel Management, Skin Care/Wound Management, Psychosocial Support  PT interventions Ambulation/gait training, Discharge planning, DME/adaptive equipment instruction, Functional mobility training, Pain management, Psychosocial support, Splinting/orthotics, Therapeutic  Activities, UE/LE Strength taining/ROM, UE/LE Coordination activities, Wheelchair propulsion/positioning, Therapeutic Exercise, Stair training, Patient/family education, Neuromuscular re-education, Disease management/prevention, Academic librarian, Training and development officer  OT Interventions Training and development officer, Disease mangement/prevention, Neuromuscular re-education, Self Care/advanced ADL retraining, Therapeutic Exercise, DME/adaptive equipment instruction, Pain management, Skin care/wound managment, UE/LE Strength taining/ROM, Community reintegration, Barrister's clerk education, Splinting/orthotics, UE/LE Coordination activities, Discharge planning, Functional mobility training, Psychosocial support, Therapeutic Activities  SLP Interventions    TR Interventions    SW/CM Interventions Discharge Planning, Psychosocial Support, Patient/Family Education   Barriers to Discharge MD  Medical stability  Nursing Decreased caregiver support, Home environment access/layout, IV antibiotics, Incontinence, Neurogenic Bowel & Bladder 1 level apt with 1 + 1 ste R/L rail  PT Inaccessible home environment, Decreased caregiver support, Home environment access/layout, Neurogenic Bowel & Bladder, Wound Care    OT      SLP      SW       Team Discharge Planning: Destination: PT-Home ,OT- Home , SLP-  Projected Follow-up: PT-Home health PT, OT-  Outpatient OT, SLP-  Projected Equipment Needs: PT-To be determined, OT- To be determined, SLP-  Equipment Details: PT-likley RW and w/c, OT-  Patient/family involved in discharge planning: PT- Patient,  OT-Patient, SLP-   MD ELOS: 14-20 days Medical Rehab Prognosis:  Excellent Assessment: The patient has been admitted for CIR therapies with the diagnosis of cervical myelopathy d/t epidural abscess. The team will be addressing functional mobility, strength, stamina, balance, safety, adaptive techniques and equipment, self-care, bowel and bladder mgt,  patient and caregiver education, NMR, pain mgt, post-operative precautions, community reentry. Goals have been set at supervision for self-care and  supervision to min assist with mobility and transfers. Anticipated discharge destination is home with family.        See Team Conference Notes for weekly updates to the plan of care

## 2022-08-03 NOTE — Progress Notes (Signed)
Physical Therapy Session Note  Patient Details  Name: Kristen Bautista MRN: [id number] Date of Birth: May 14, 1956  Today's Date: 08/03/2022 PT Individual Time: 1100-1204 PT Individual Time Calculation (min): 64 min   Short Term Goals: Week 1:  PT Short Term Goal 1 (Week 1): pt will sit EOB with CGA or better PT Short Term Goal 2 (Week 1): Pt will perform STS with mod A consistently PT Short Term Goal 3 (Week 1): Pt will tolerate sitting OOB between sessions for improved activity tolerance  Skilled Therapeutic Interventions/Progress Updates:    Pt seated in w/c on arrival and agreeable to therapy. Pt reports 10/10 pain with mobility, RN provided pain medication during session, extended rest breaks and slow speed for pain management throughout. Pt requesting to use BSC before leaving room. Sit to stand with Min a to power up and max-tot for balance d/t extensor tone. Tot a for Stand pivot transfer with RW as pt's feet became crossed. Pt has extreme difficulty coordinating anterior weight shift and bending at hips and knees to sit, but is resistant to facilitation. Pt had continent Bladder void with extended time. Documented in flow sheet. Pt then performed max A stand pivot back to w/c.  Pt transported to therapy gym for time management and energy conservation. Pt stood to standing frame x 2 with mod A for balance while leaning on standing frame, but limited by extreme pain in neck. Pt with elevated pain levels that did not come back down by end of session. Pt remained in w/c, was left with all needs in reach and alarm active.   Therapy Documentation Precautions:  Precautions Precautions: Cervical Precaution Comments: ok for out of brace in bed, for showering, and for short trips to bathroom Required Braces or Orthoses: Cervical Brace Cervical Brace: Hard collar, Other (comment) Restrictions Weight Bearing Restrictions: No General:       Therapy/Group: Individual Therapy  Mickel Fuchs 08/03/2022, 11:17 AM

## 2022-08-03 NOTE — Care Management (Signed)
Inpatient [facility] Individual Statement of Services  Patient Name:  Kristen Bautista  Date:  08/03/2022  Welcome to the [facility].  Our goal is to provide you with an individualized program based on your diagnosis and situation, designed to meet your specific needs.  With this comprehensive rehabilitation program, you will be expected to participate in at least 3 hours of rehabilitation therapies Monday-Friday, with modified therapy programming on the weekends.  Your rehabilitation program will include the following services:  Physical Therapy (PT), Occupational Therapy (OT), 24 hour per day rehabilitation nursing, Therapeutic Recreaction (TR), Psychology, Neuropsychology, Care Coordinator, Rehabilitation Medicine, [place], and Other  Weekly team conferences will be held on Tuesdays to discuss your progress.  Your Inpatient Rehabilitation Care Coordinator will talk with you frequently to get your input and to update you on team discussions.  Team conferences with you and your family in attendance may also be held.  Expected length of stay: 14-21 days    Overall anticipated outcome: Supervision  Depending on your progress and recovery, your program may change. Your Inpatient Rehabilitation Care Coordinator will coordinate services and will keep you informed of any changes. Your Inpatient Rehabilitation Care Coordinator's name and contact numbers are listed  below.  The following services may also be recommended but are not provided by the [facility] will be made to provide these services after discharge if needed.  Arrangements include referral to agencies that provide these services.  Your insurance has been verified to be:  [facility] primary doctor is:  [name]  Pertinent information will be shared with your doctor and your insurance company.  Inpatient Rehabilitation Care Coordinator:  Cathleen Corti [id number] or ([phone]  Information discussed with and copy given to patient by: Rana Snare, 08/03/2022, 9:03 AM

## 2022-08-03 NOTE — Progress Notes (Signed)
Physical Therapy Session Note  Patient Details  Name: Kristen Bautista MRN: [id number] Date of Birth: 03-21-1956  Today's Date: 08/03/2022 PT Individual Time: [id number] PT Individual Time Calculation (min): 42 min   Short Term Goals: Week 1:  PT Short Term Goal 1 (Week 1): pt will sit EOB with CGA or better PT Short Term Goal 2 (Week 1): Pt will perform STS with mod A consistently PT Short Term Goal 3 (Week 1): Pt will tolerate sitting OOB between sessions for improved activity tolerance  Skilled Therapeutic Interventions/Progress Updates: Pt presents sitting in w/c and reluctantly agreeable to therapy.  Pt states pain and agreeable to return to bed and perform LLE there ex.  Pt performs sit to stand w/ mod A and split hand placement.  Pt requires hand over hand or cueing for proper hand placement, pt asking "do I have it" 2/2 numbness in UES.  Pt performed step-pivot w/ RW and mod to min A w/c > bed.  Pt again requires assurance that she is gripping hand rail of bed, although not a strong grip.  Pt requires max > mod A for log roll transfer sit > sidelying and then to supine.  Pt scoot LES onto bed and max A for UB positioning.  Pt attempts bridging but unable to perform.  Pt performs HS, abd/add, SAQ w/ verbal cues for control of LE s L > right.  Ppt requires rest breaks 2/2 fatigue and encouragement to perform, pt falling asleep.  Bed alarm on and all needs in reach.     Therapy Documentation Precautions:  Precautions Precautions: Cervical Precaution Comments: ok for out of brace in bed, for showering, and for short trips to bathroom Required Braces or Orthoses: Cervical Brace Cervical Brace: Hard collar, Other (comment) Restrictions Weight Bearing Restrictions: No General:   Vital Signs:  Pain:unable to quantify. Pain Assessment Pain Scale: 0-10 Pain Score: 6  Pain Type: Acute pain Pain Location: Neck Pain Descriptors / Indicators: Aching Pain Frequency: Constant Pain Onset:  Progressive Patients Stated Pain Goal: 4 Pain Intervention(s): Medication (See eMAR)    Therapy/Group: Individual Therapy  Ladoris Gene 08/03/2022, 3:02 PM

## 2022-08-03 NOTE — Progress Notes (Addendum)
Patient ID: Kristen Bautista, female   DOB: 12-03-1955, 66 y.o.   MRN: [id number]  SW left message for pt cousin Winn Jock [phone]) to inform on ELOS, confirm support at discharge, and will follow-up with updates after team conference.   [id number] spoke with pt son Linton Rump to introduce self, explain role, discuss discharge process, and inform on ELOS. He confirms their cousin Loel Lofty is supposed to assist with care at discharge. SW shared unable to connect with him, and asked if he could ask Loel Lofty to call to confirm d/c plan.  SW also shared there will be family education once a d/c date is set. SW will follow-up with updates after team conference.  *SW received return phone call from pt cousin Loel Lofty to confirm he will be staying with her. States he will stay with her until another cousin is able to take over. They will make sure she has 24/7 care. SW informed will follow-up with updates after team conference.   Loralee Pacas, MSW, [place] Office: [phone] Cell: [phone] Fax: [phone]

## 2022-08-03 NOTE — Progress Notes (Signed)
Physical Therapy Session Note  Patient Details  Name: Kristen Bautista MRN: [id number] Date of Birth: 1956/05/03  Today's Date: 08/03/2022 PT Individual Time: [id number] PT Individual Time Calculation (min): 42 min   Short Term Goals: Week 1:  PT Short Term Goal 1 (Week 1): pt will sit EOB with CGA or better PT Short Term Goal 2 (Week 1): Pt will perform STS with mod A consistently PT Short Term Goal 3 (Week 1): Pt will tolerate sitting OOB between sessions for improved activity tolerance  Skilled Therapeutic Interventions/Progress Updates:   Received pt sitting in [facility] with cervical collar donned and CSW present at bedside going through questions. Pt agreeable to PT treatment and reported pain in neck increases with mobility but denied any pain at rest. Worked on blocked practice sit<>stands from [facility] with RW and mod/max A x 3 trials (Trials 1 & 2 with mod A and trial 3 with max A). In standing, worked on alternating marches x10 with min A for balance with emphasis on coordination and control. Upon standing on 2nd trial, extensor tone kicked in and pt unable to come forward to straighten knees and required max A to lower back into WC. Upon sitting, pt stated "I can't do no more" and began sliding forward in WC. Required max A to scoot hips back in WC due to extensor tone. Pt required increased time to initiate movement and very cautious to move due to anticipation of pain. Decided to take phone call during session - therefore performed seated LAQ x10 bilaterally and hip flexion x10 bilaterally while finishing phone call. Concluded session with pt sitting in WC, needs within reach, and seatbelt alarm on.   Therapy Documentation Precautions:  Precautions Precautions: Cervical Precaution Comments: ok for out of brace in bed, for showering, and for short trips to bathroom Required Braces or Orthoses: Cervical Brace Cervical Brace: Hard collar, Other (comment) Restrictions Weight Bearing Restrictions:  No  Therapy/Group: Individual Therapy Alfonse Alpers PT, DPT  08/03/2022, 7:05 AM

## 2022-08-03 NOTE — Progress Notes (Addendum)
PROGRESS NOTE   Subjective/Complaints: Pt without new complaints. Was able to sleep last night. She wanted purewick so that she didn't have to get up to void at night (pain). Still not eating much. Tray was in front of her this morning but she could not reach. She start eating sausage when I moved tray for her. Hands and feet still feel numb.   ROS: Patient denies fever, rash, sore throat, blurred vision, dizziness, nausea, vomiting, diarrhea, cough, shortness of breath or chest pain, joint or back/neck pain, headache, or mood change.    Objective:   VAS [place] LOWER EXTREMITY VENOUS (DVT)  Result Date: 08/01/2022  Lower Venous DVT Study Patient Name:  SAFIRE BOLASH  Date of Exam:   08/01/2022 Medical Rec #: [id number]       Accession #:    [id number] Date of Birth: 1955/09/02       Patient Gender: F Patient Age:   67 years Exam Location:  [facility] Procedure:      VAS [place] LOWER EXTREMITY VENOUS (DVT) Referring Phys: Lauraine Rinne --------------------------------------------------------------------------------  Indications: Swelling.  Risk Factors: None identified. Comparison Study: No prior studies. Performing Technologist: Oliver Hum RVT  Examination Guidelines: A complete evaluation includes B-mode imaging, spectral Doppler, color Doppler, and power Doppler as needed of all accessible portions of each vessel. Bilateral testing is considered an integral part of a complete examination. Limited examinations for reoccurring indications may be performed as noted. The reflux portion of the exam is performed with the patient in reverse Trendelenburg.  +---------+---------------+---------+-----------+----------+--------------+ RIGHT    CompressibilityPhasicitySpontaneityPropertiesThrombus Aging +---------+---------------+---------+-----------+----------+--------------+ CFV      Full           Yes      Yes                                  +---------+---------------+---------+-----------+----------+--------------+ SFJ      Full                                                        +---------+---------------+---------+-----------+----------+--------------+ FV Prox  Full                                                        +---------+---------------+---------+-----------+----------+--------------+ FV Mid   Full                                                        +---------+---------------+---------+-----------+----------+--------------+ FV DistalFull                                                        +---------+---------------+---------+-----------+----------+--------------+  PFV      Full                                                        +---------+---------------+---------+-----------+----------+--------------+ POP      Full           Yes      Yes                                 +---------+---------------+---------+-----------+----------+--------------+ PTV      Full                                                        +---------+---------------+---------+-----------+----------+--------------+ PERO     Full                                                        +---------+---------------+---------+-----------+----------+--------------+   +---------+---------------+---------+-----------+----------+--------------+ LEFT     CompressibilityPhasicitySpontaneityPropertiesThrombus Aging +---------+---------------+---------+-----------+----------+--------------+ CFV      Full           Yes      Yes                                 +---------+---------------+---------+-----------+----------+--------------+ SFJ      Full                                                        +---------+---------------+---------+-----------+----------+--------------+ FV Prox  Full                                                         +---------+---------------+---------+-----------+----------+--------------+ FV Mid   Full                                                        +---------+---------------+---------+-----------+----------+--------------+ FV DistalFull                                                        +---------+---------------+---------+-----------+----------+--------------+ PFV      Full                                                        +---------+---------------+---------+-----------+----------+--------------+  POP      Full           Yes      Yes                                 +---------+---------------+---------+-----------+----------+--------------+ PTV      Full                                                        +---------+---------------+---------+-----------+----------+--------------+ PERO     Full                                                        +---------+---------------+---------+-----------+----------+--------------+     Summary: RIGHT: - There is no evidence of deep vein thrombosis in the lower extremity.  - No cystic structure found in the popliteal fossa.  LEFT: - There is no evidence of deep vein thrombosis in the lower extremity.  - No cystic structure found in the popliteal fossa.  *See table(s) above for measurements and observations. Electronically signed by Jamelle Haring on 08/01/2022 at 10:07:53 PM.    Final    Recent Labs    08/01/22 2108 08/02/22 0448  WBC 8.9 8.5  HGB 10.5* 10.3*  HCT 31.1* 32.0*  PLT 348 372   Recent Labs    08/01/22 0322 08/01/22 2108 08/02/22 0448  NA 137  --  139  K 3.8  --  3.5  CL 101  --  103  CO2 27  --  27  GLUCOSE 96  --  101*  BUN 16  --  16  CREATININE 0.80 0.71 0.71  CALCIUM 9.5  --  9.5    Intake/Output Summary (Last 24 hours) at 08/03/2022 0924 Last data filed at 08/03/2022 0732 Gross per 24 hour  Intake 420.71 ml  Output 175 ml  Net 245.71 ml        Physical Exam: Vital Signs Blood  pressure (!) 144/92, pulse (!) 105, temperature 98.4 F (36.9 C), temperature source Oral, resp. rate 18, height 5' 7"$  (1.702 m), weight 55.7 kg, SpO2 99 %.  Constitutional: No distress . Vital signs reviewed. HEENT: NCAT, EOMI, oral membranes moist Neck: cervical collar Cardiovascular: RRR without murmur. No JVD    Respiratory/Chest: CTA Bilaterally without wheezes or rales. Normal effort    GI/Abdomen: BS +, non-tender, non-distended Ext: no clubbing, cyanosis, or edema Psych: pleasant and cooperative  Skin: Clean and intact without signs of breakdown. Wound cdi Neuro:  Alert and oriented x 3. Fair insight and awareness. Functional Memory. Normal language and speech. Cranial nerve exam unremarkable. UE motor 3 prox to 4/5 distally with decreased LT at finger tips bilaterally. LE 3+ prox to 4/5 distally with decreased LT below ankle both feet.  Musculoskeletal: neck tender even in collar with basic head movement    Assessment/Plan: 1. Functional deficits which require 3+ hours per day of interdisciplinary therapy in a comprehensive inpatient rehab setting. Physiatrist is providing close team supervision and 24 hour management of active medical problems listed below. Physiatrist and rehab team continue to assess barriers to discharge/monitor patient progress  toward functional and medical goals  Care Tool:  Bathing        Body parts bathed by helper: Right arm, Left arm, Chest, Abdomen, Front perineal area, Buttocks, Right upper leg, Left upper leg, Right lower leg, Left lower leg, Face     Bathing assist Assist Level: Total Assistance - Patient < 25%     Upper Body Dressing/Undressing Upper body dressing   What is the patient wearing?: Hospital gown only    Upper body assist Assist Level: Total Assistance - Patient < 25%    Lower Body Dressing/Undressing Lower body dressing      What is the patient wearing?: Underwear/pull up, Pants     Lower body assist Assist for  lower body dressing: Total Assistance - Patient < 25%     Toileting Toileting    Toileting assist Assist for toileting: Total Assistance - Patient < 25%     Transfers Chair/bed transfer  Transfers assist     Chair/bed transfer assist level: Maximal Assistance - Patient 25 - 49%     Locomotion Ambulation   Ambulation assist   Ambulation activity did not occur: Safety/medical concerns          Walk 10 feet activity   Assist  Walk 10 feet activity did not occur: Safety/medical concerns        Walk 50 feet activity   Assist Walk 50 feet with 2 turns activity did not occur: Safety/medical concerns         Walk 150 feet activity   Assist Walk 150 feet activity did not occur: Safety/medical concerns         Walk 10 feet on uneven surface  activity   Assist Walk 10 feet on uneven surfaces activity did not occur: Safety/medical concerns         Wheelchair     Assist Is the patient using a wheelchair?: Yes (Pt will use chair, but unable to get to chair during eval d/t pain)   Wheelchair activity did not occur: Safety/medical concerns         Wheelchair 50 feet with 2 turns activity    Assist    Wheelchair 50 feet with 2 turns activity did not occur: Safety/medical concerns       Wheelchair 150 feet activity     Assist  Wheelchair 150 feet activity did not occur: Safety/medical concerns       Blood pressure (!) 144/92, pulse (!) 105, temperature 98.4 F (36.9 C), temperature source Oral, resp. rate 18, height 5' 7"$  (1.702 m), weight 55.7 kg, SpO2 99 %.  Medical Problem List and Plan: 1. Functional deficits secondary to severe cervical stenosis with myelopathy/epidural abscess.  Status post open posterior cervical lateral instrumentation posterior lateral fusion C3-4 4-5 C5-6 as well as laminotomies evacuation of epidural abscess C3-4 07/28/2022.  Cervical collar when out of bed.             -patient may not shower              -ELOS/Goals: 13 to 17 days, modified independent and supervision with PT and OT             -Continue CIR therapies including PT, OT             2.  Antithrombotics: -DVT/anticoagulation:  Pharmaceutical: changed heparin to sq lovenox 28m daily to decrease sticks             -antiplatelet therapy: N/A 3. Pain Management: Oxycodone/Robaxin as needed  -  is taking prior to therapies with good results.  4. Mood/Behavior/Sleep: Provide emotional support             -antipsychotic agents: N/A 5. Neuropsych/cognition: This patient is capable of making decisions on her own behalf. 6. Skin/Wound Care: Routine skin checks 7. Fluids/Electrolytes/Nutrition: encourage po -added protein supplement. -consulted RD 2/22 -can have food from home -begin megace trial 8.  ID.  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  Currently on vancomycin and ceftriaxone until 09/08/2022.   9.  Hypertension.  Norvasc 5 mg daily.     -borderline 2/23--obsv today 10.  History of alcohol/tobacco/cannabis use.  Provide counseling, monitor for withdrawal 11.  Slow transit Constipation/neurogenic bowel: pt has flatus, intake minimal over last few days.  -encouraged po intake.    -can have food from home  -change colace to senna-s at bedtime             -continue daily miralax - sorbitol as needed 12.  Aortic atherosclerosis             -Consider resume aspirin and atorvastatin on discharge    LOS: 2 days A FACE TO FACE EVALUATION WAS PERFORMED  Meredith Staggers 08/03/2022, 9:24 AM

## 2022-08-03 NOTE — Progress Notes (Signed)
Occupational Therapy Session Note  Patient Details  Name: Kristen Bautista MRN: [id number] Date of Birth: [birth year]  Today's Date: 08/03/2022 OT Individual Time: [id number] Individual Time Calculation (min): 57 min    Short Term Goals: Week 1:  OT Short Term Goal 1 (Week 1): Pt will be able to sit EOB with min A in prep for UB dressing OT Short Term Goal 2 (Week 1): Pt will be able to come forward in prep for transfer with supervision OT Short Term Goal 3 (Week 1): Pt will transfer to toilet/BSC with mod A consistently OT Short Term Goal 4 (Week 1): Pt will thread LB clothing with min A  Skilled Therapeutic Interventions/Progress Updates:  Pt greeted supine in bed finishing breakfast tray. Pt using LUE mostly for self feeding finger foods, did remove muffin tin with pt able to self feed with only supervision and increased time as hand to mouth pattern noted to be sporadic and uncoordinated.   Pt stating " I feel like your rushing me" education provided on OT POC and wanting to work on ADLs such as self feeding and dressing as pt asking if therapist could don pants while pt finished breakfast. Pt also wanting time to use purewick however encouraged pt to attempt bladder voids OOB on BSC however pt reports "its already in there." Pt needed increased time to attempt to void from bed level with no success.   Pt agreeable to transition to EOB with overall CGA and step by step cues for log roll, pt wanting to have +2 assist for transfer OOB. Pt completed sit>stand with Rw and MODA, +2 MOD A to pull up pants and remove pure wick in standing. Pt able to pivot to w/c to R side with RW and MODA +2.   Ended session with pt seated in w/c with alarm belt activated and all needs within reach.   Therapy Documentation Precautions:  Precautions Precautions: Cervical Precaution Comments: ok for out of brace in bed, for showering, and for short trips to bathroom Required Braces or Orthoses: Cervical  Brace Cervical Brace: Hard collar, Other (comment) Restrictions Weight Bearing Restrictions: No   Pain: unrated pain reported in neck, rest breaks and repositioning provided as needed.     Therapy/Group: Individual Therapy  Corinne Ports [facility] 08/03/2022, 12:04 PM

## 2022-08-03 NOTE — Final Progress Note (Signed)
Diagnosis: Cervical epidural abscess  Culture Result: Negative  No Known Allergies  OPAT Orders Discharge antibiotics to be given via PICC line Discharge antibiotics: Rocephin 2g IV every 24 hours  Vancomycin 500 mg IV every 12 hours   Aim for Vancomycin trough 15-20 or AUC 400-550 (unless otherwise indicated)  Duration: 6 weeks  End Date: 09/08/22  [facility] Care Per Protocol:  Home health RN for IV administration and teaching; PICC line care and labs.    Labs weekly while on IV antibiotics: _xxx_ CBC with differential __ BMP _xxx_ CMP _xxx_ CRP _xxx_ ESR _xxx_ Vancomycin trough __ CK  _xxx_ Please pull PIC at completion of IV antibiotics __ Please leave PIC in place until doctor has seen patient or been notified  Fax weekly labs to [phone]  Clinic Follow Up Appt: 08/29/22 at 4pm with Dr Juleen China

## 2022-08-03 NOTE — Progress Notes (Addendum)
Inpatient Rehabilitation Care Coordinator Assessment and Plan Patient Details  Name: Kristen Bautista MRN: [id number] Date of Birth: February 22, 1956  Today's Date: 08/03/2022  Hospital Problems: Principal Problem:   Cervical myelopathy [facility])  Past Medical History:  Past Medical History:  Diagnosis Date   Alcohol abuse    Anemia    Aortic atherosclerosis ([place])    Cannabis abuse    Centrilobular emphysema ([place])    Cervical myelopathy ([place])    with BUE paresthesias   Hepatitis-C    HLD (hyperlipidemia)    HTN (hypertension)    Nicotine addiction    Tobacco abuse    Past Surgical History:  Past Surgical History:  Procedure Laterality Date   ABDOMINAL HERNIA REPAIR     POSTERIOR CERVICAL FUSION/FORAMINOTOMY N/A 07/28/2022   Procedure: POSTERIOR CERVICAL DECOMPRESSION, INSTRUMENTATION AND FUSION CERVICAL THREE-CERVICAL SIX;  Surgeon: Karsten Ro, DO;  Location: [place];  Service: Neurosurgery;  Laterality: N/A;   Social History:  reports that she has been smoking cigarettes. She has a 8.00 pack-year smoking history. She has been exposed to tobacco smoke. She has never used smokeless tobacco. She reports current alcohol use of about 42.0 standard drinks of alcohol per week. She reports current drug use. Frequency: 2.00 times per week. Drug: Marijuana.  Family / Support Systems Marital Status: Divorced How Long?: atleast 24 yrs Patient Roles: Parent Spouse/Significant Other: Divorced Children: 3 sons- Linton Rump (lives in [place]), Delfino Lovett (lives in [place], [place]), and Abbe Amsterdam (lives in [place]) Other Supports: cousin [name] and Cornelia Copa (will travel here 09/10/22 to help assist) Anticipated Caregiver: [name] Ability/Limitations of Caregiver: No concerns reported by [employer]. Reports he will be moving in with her to make sure she has 24/7 care. Caregiver Availability: 24/7 Family Dynamics: Pt lives alone.  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 Education: 11th grade Health Literacy - How often do you need to have someone help you when you read instructions, pamphlets, or other written material from your doctor or pharmacy?: Never Writes: Yes Employment Status: Retired Date Retired/Disabled/Unemployed: [place] Age Retired: 77 Public relations account executive Issues: pt reports a DWI 20+ yrs ago Guardian/Conservator: N/A   Abuse/Neglect Abuse/Neglect Assessment Can Be Completed: Yes Physical Abuse: Denies Verbal Abuse: Denies Sexual Abuse: Denies Exploitation of patient/patient's resources: Denies Self-Neglect: Denies  Patient response to: Social Isolation - How often do you feel lonely or isolated from those around you?: Never  Emotional Status Pt's affect, behavior and adjustment status: Pt in good spirits at time of visit. Recent Psychosocial Issues: Denies Psychiatric History: Denies Substance Abuse History: Pt admits that she smokes 1/2pk of cigarettes per week; drinks 6 beers daily; and marijuana use on the weekends.  Patient / Family Perceptions, Expectations & Goals Pt/Family understanding of illness & functional limitations: Pt and family have a general understanding of care needs Premorbid pt/family roles/activities: Independent Anticipated changes in roles/activities/participation: Assistance with ADLs.IADLs Pt/family expectations/goals: Pt goal is to "work on getting my feelings back in my hands and legs and resolving neck pain."  [employer]: None Premorbid Home Care/DME Agencies: None Transportation available at discharge: Daphene Jaeger Is the patient able to respond to transportation needs?: Yes In the past 12 months, has lack of transportation kept you from medical appointments or from getting medications?: No In the past 12 months, has lack of transportation kept you from meetings, work, or from getting  things needed for  daily living?: No Resource referrals recommended: Neuropsychology  Discharge Planning Living Arrangements: Alone Support Systems: Other relatives Type of Residence: Private residence Insurance Resources: Multimedia programmer (specify) ([place]) Financial Resources: Employment, Radio broadcast assistant Screen Referred: No Living Expenses: Rent Money Management: Patient Does the patient have any problems obtaining your medications?: No Home Management: Pt managed all home care needs Patient/Family Preliminary Plans: TBD Care Coordinator Barriers to Discharge: Decreased caregiver support, Lack of/limited family support, IV antibiotics Care Coordinator Anticipated Follow Up Needs: HH/OP Expected length of stay: 14-21 days  Clinical Impression  SW met with pt in room to introduce self, explain role,and discuss discharge process. 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 No HCPOA. No DME.   SW discussed referral with Pam Chandler/[facility] for IV abx. Reports not in network with insurance plan. Reports plan in in network with[facility] based in [place]. Reports IV abx cost is expensive. SW will explore options during course of stay here in rehab.   SW spoke with Tammy/[facility] [phone]) who reported to speak with Intake to get more insight, [phone]. SW spoke with Tia. Reports insurance ran meds- ceftiraxone ($494.80/7 days), vancomycin ($287.25/7 days, and Daptomycin QR:[id number]/7 days). SW discussed other options such as payment plans, and financial assistance. Reports primary CM is Daniesha. States she will inform her on request, and form will be sent to their Director of Operations for financial hardship consideration for initial request. States once approved, referral will be emailed or mailed to patient.   Per EMR, ID note on 2/23; d/c antibiotics are Rocephin and Vancomyocin.  *1551- SW received phone call from [place] [phone]) reporting  the following costs:  Weekly total for medications $782.05: Ceftiraxone $494.80/7 days vancomycin $287.25/7 days  Per Diem cost per medication for heparin and saline $175: ceftiraxone $105 vancomycin $75  Total weekly cost for meds and per diem $992.00  If RN is needed $120 per visit unless pt using another HHA.   Cassandria Anger reports that the director of operations has to approve financial assistance needed after speaking with pt and to determine if they can manage a payment plan. SW shared will follow-up after speaking with pt.   SW sent out tentative [facility] referral for HHPT/OT/SN to see options.   Yasamin Karel A Azaliah Carrero 08/03/2022, 3:11 PM

## 2022-08-03 NOTE — Progress Notes (Signed)
Initial Nutrition Assessment  DOCUMENTATION CODES:   Non-severe (moderate) malnutrition in context of chronic illness  INTERVENTION:  - Continue Ensure Max po q day, each supplement provides 150 kcal and 30 grams of protein.   - Continue Ensure Enlive po BID, each supplement provides 350 kcal and 20 grams of protein.  -Add MVI q day.   - Will add "Needs assistance with meals"   NUTRITION DIAGNOSIS:   Moderate Malnutrition related to chronic illness as evidenced by energy intake < or equal to 75% for > or equal to 1 month, mild muscle depletion, mild fat depletion.  GOAL:   Patient will meet greater than or equal to 90% of their needs  MONITOR:   PO intake, Supplement acceptance  REASON FOR ASSESSMENT:   Consult Poor PO  ASSESSMENT:   67 y.o. female admits to CIR related to functional deficits secondary to severe cervical stenosis with myelopathy/epidural abscess. PMH includes: alcohol abuse, anemia, aortic atherosclerosis, cannabis abuse, centrilobular emphysema, cervical myelopathy, Hep-C, HLD, HTN.  Meds reviewed: miralax, senokot. Labs reviewed: WDL.   The pt reports that she has been eating her "normal" amount. She states that she is not a very big eater. Wts stable per record. Pt currently has Ensure supplements ordered. She states that she likes the Ensure and has been drinking it. She also reports that she does not like some of the meals she has received. Pt also states that she needs some assistance with meals due to difficulty getting utensil to her mouth.   NUTRITION - FOCUSED PHYSICAL EXAM:  Flowsheet Row Most Recent Value  Orbital Region Mild depletion  Upper Arm Region Mild depletion  Thoracic and Lumbar Region Unable to assess  Buccal Region Mild depletion  Temple Region Mild depletion  Clavicle Bone Region Mild depletion  Clavicle and Acromion Bone Region Mild depletion  Scapular Bone Region Unable to assess  Dorsal Hand Mild depletion  Patellar  Region Mild depletion  Anterior Thigh Region Mild depletion  Posterior Calf Region Mild depletion  Edema (RD Assessment) None  Hair Reviewed  Eyes Reviewed  Mouth Reviewed  Skin Reviewed  Nails Reviewed       Diet Order:   Diet Order             Diet regular Room service appropriate? Yes; Fluid consistency: Thin  Diet effective now                   EDUCATION NEEDS:   Not appropriate for education at this time  Skin:  Skin Assessment: Skin Integrity Issues: Skin Integrity Issues:: Incisions Incisions: Neck  Last BM:  2/21  Height:   Ht Readings from Last 1 Encounters:  08/01/22 '5\' 7"'$  (1.702 m)    Weight:   Wt Readings from Last 1 Encounters:  08/01/22 55.7 kg    Ideal Body Weight:     BMI:  Body mass index is 19.23 kg/m.  Estimated Nutritional Needs:   Kcal:  1670-1950 kcals  Protein:  80-100 gm  Fluid:  >/= 1.6 L  Kristen Bautista, RD, LDN, CNSC.

## 2022-08-04 DIAGNOSIS — K5901 Slow transit constipation: Secondary | ICD-10-CM | POA: Diagnosis not present

## 2022-08-04 DIAGNOSIS — G959 Disease of spinal cord, unspecified: Secondary | ICD-10-CM | POA: Diagnosis not present

## 2022-08-04 MED ORDER — POLYETHYLENE GLYCOL 3350 17 G PO PACK
17.0000 g | PACK | Freq: Two times a day (BID) | ORAL | Status: DC
  Administered 2022-08-04 – 2022-08-05 (×2): 17 g via ORAL
  Filled 2022-08-04 (×2): qty 1

## 2022-08-04 MED ORDER — DOCUSATE SODIUM 100 MG PO CAPS
100.0000 mg | ORAL_CAPSULE | Freq: Every day | ORAL | Status: DC
  Administered 2022-08-04 – 2022-08-23 (×20): 100 mg via ORAL
  Filled 2022-08-04 (×20): qty 1

## 2022-08-04 NOTE — Progress Notes (Signed)
Pharmacy Antibiotic Note  Kristen Bautista is a 67 y.o. female admitted on 08/01/2022 with cervical epidural abscess.  Pharmacy has been consulted for vancomycin dosing.  Patient is also on Rocephin.  Last Scr (2/22) was stable, WBC WNL, patient is afebrile. Most recent vancomycin trough was 13 mcg/mL >> estimated AUC ~480 (goal 400-550). No changes to antibiotics needed at this time.  Plan: Continue vanc '500mg'$  IV Q12H Continue Rocephin 2gm IV Q24H per MD Monitor renal fxn, clinical progress, weekly vanc levels Per ID, plan for 6 weeks of abx from OR on 07/28/22 (End date: 09/08/22)  Height: '5\' 7"'$  (170.2 cm) Weight: 55.7 kg (122 lb 12.7 oz) IBW/kg (Calculated) : 61.6  Temp (24hrs), Avg:98.3 F (36.8 C), Min:98 F (36.7 C), Max:98.6 F (37 C)  Recent Labs  Lab 07/29/22 0454 07/29/22 1059 08/01/22 0322 08/01/22 2108 08/02/22 0448  WBC 10.5  --   --  8.9 8.5  CREATININE 1.12*  --  0.80 0.71 0.71  VANCOTROUGH  --  21* 13*  --   --   VANCORANDOM 33  --   --   --   --      Estimated Creatinine Clearance: 60.8 mL/min (by C-G formula based on SCr of 0.71 mg/dL).    No Known Allergies  Antimicrobials: Vanc 2/16 >> 09/08/22 Zosyn 2/16 >>2/18 CTX 2/18 >> 09/08/22  Monitoring: 2/18 VP/VT 33/21 on '750mg'$  q12 >> decr '500mg'$  q12 2/21 VT 13 (estimated AUC 480) >> no change  Microbiology Results: 2/16 MRSA PCR - negative 2/16 BCx - negative 2/17 cervical tissue - ngtd  Louanne Belton, PharmD, [facility] PGY1 Pharmacy Resident 08/04/2022 8:15 AM

## 2022-08-04 NOTE — Progress Notes (Signed)
PROGRESS NOTE   Subjective/Complaints: Pt slept ok, urinating well, hasn't had a BM since she was hospitalized (although LBM documented as 08/01/22). Pain in neck mostly with activity but pain meds help. Still has paresthesias in hands/feet. Otherwise, no complaints.   ROS: +neck pain-improving, +paresthesias, +Constipation. Denies fevers, chills, CP, SOB, abd pain, N/V/D, new/worsening paresthesias/weakness, or any other complaints at this time.     Objective:   No results found. Recent Labs    08/01/22 2108 08/02/22 0448  WBC 8.9 8.5  HGB 10.5* 10.3*  HCT 31.1* 32.0*  PLT 348 372   Recent Labs    08/01/22 2108 08/02/22 0448  NA  --  139  K  --  3.5  CL  --  103  CO2  --  27  GLUCOSE  --  101*  BUN  --  16  CREATININE 0.71 0.71  CALCIUM  --  9.5    Intake/Output Summary (Last 24 hours) at 08/04/2022 1218 Last data filed at 08/04/2022 0848 Gross per 24 hour  Intake 277 ml  Output 1425 ml  Net -1148 ml        Physical Exam: Vital Signs Blood pressure (!) 147/64, pulse 76, temperature 98.6 F (37 C), temperature source Oral, resp. rate 16, height '5\' 7"'$  (1.702 m), weight 55.7 kg, SpO2 97 %.  Constitutional: No distress . Vital signs reviewed. Laying in bed HEENT: NCAT, EOMI, oral membranes moist Neck: cervical collar in place Cardiovascular: RRR without murmur. No JVD    Respiratory/Chest: CTA Bilaterally without wheezes or rales. Normal effort    GI/Abdomen: BS +, non-tender, non-distended Ext: no clubbing, cyanosis, or edema Psych: cooperative, slightly odd affect.   PRIOR EXAM: Skin: Clean and intact without signs of breakdown. Wound cdi Neuro:  Alert and oriented x 3. Fair insight and awareness. Functional Memory. Normal language and speech. Cranial nerve exam unremarkable. UE motor 3 prox to 4/5 distally with decreased LT at finger tips bilaterally. LE 3+ prox to 4/5 distally with decreased LT below  ankle both feet.  Musculoskeletal: neck tender even in collar with basic head movement    Assessment/Plan: 1. Functional deficits which require 3+ hours per day of interdisciplinary therapy in a comprehensive inpatient rehab setting. Physiatrist is providing close team supervision and 24 hour management of active medical problems listed below. Physiatrist and rehab team continue to assess barriers to discharge/monitor patient progress toward functional and medical goals  Care Tool:  Bathing        Body parts bathed by helper: Right arm, Left arm, Chest, Abdomen, Front perineal area, Buttocks, Right upper leg, Left upper leg, Right lower leg, Left lower leg, Face     Bathing assist Assist Level: Total Assistance - Patient < 25%     Upper Body Dressing/Undressing Upper body dressing   What is the patient wearing?: Hospital gown only    Upper body assist Assist Level: Total Assistance - Patient < 25%    Lower Body Dressing/Undressing Lower body dressing      What is the patient wearing?: Underwear/pull up, Pants     Lower body assist Assist for lower body dressing: Total Assistance - Patient < 25%  Toileting Toileting    Toileting assist Assist for toileting: Total Assistance - Patient < 25%     Transfers Chair/bed transfer  Transfers assist     Chair/bed transfer assist level: Moderate Assistance - Patient 50 - 74%     Locomotion Ambulation   Ambulation assist   Ambulation activity did not occur: Safety/medical concerns          Walk 10 feet activity   Assist  Walk 10 feet activity did not occur: Safety/medical concerns        Walk 50 feet activity   Assist Walk 50 feet with 2 turns activity did not occur: Safety/medical concerns         Walk 150 feet activity   Assist Walk 150 feet activity did not occur: Safety/medical concerns         Walk 10 feet on uneven surface  activity   Assist Walk 10 feet on uneven surfaces  activity did not occur: Safety/medical concerns         Wheelchair     Assist Is the patient using a wheelchair?: Yes (Pt will use chair, but unable to get to chair during eval d/t pain)   Wheelchair activity did not occur: Safety/medical concerns         Wheelchair 50 feet with 2 turns activity    Assist    Wheelchair 50 feet with 2 turns activity did not occur: Safety/medical concerns       Wheelchair 150 feet activity     Assist  Wheelchair 150 feet activity did not occur: Safety/medical concerns       Blood pressure (!) 147/64, pulse 76, temperature 98.6 F (37 C), temperature source Oral, resp. rate 16, height '5\' 7"'$  (1.702 m), weight 55.7 kg, SpO2 97 %.  Medical Problem List and Plan: 1. Functional deficits secondary to severe cervical stenosis with myelopathy/epidural abscess.  Status post open posterior cervical lateral instrumentation posterior lateral fusion C3-4 4-5 C5-6 as well as laminotomies evacuation of epidural abscess C3-4 07/28/2022.  Cervical collar when out of bed.             -patient may not shower -ELOS/Goals: 13 to 17 days, modified independent and supervision with PT and OT             -Continue CIR therapies including PT, OT             2.  Antithrombotics: -DVT/anticoagulation:  Pharmaceutical: changed heparin to sq lovenox '40mg'$  daily to decrease sticks             -antiplatelet therapy: N/A 3. Pain Management: Oxycodone 5/'10mg'$  q3h PRN, Robaxin '500mg'$  q6h as needed  -is taking prior to therapies with good results.  4. Mood/Behavior/Sleep: Provide emotional support             -antipsychotic agents: N/A 5. Neuropsych/cognition: This patient is capable of making decisions on her own behalf. 6. Skin/Wound Care: Routine skin checks 7. Fluids/Electrolytes/Nutrition: encourage po. Weekly labs starting 08/06/22 -added protein supplement. Continue vitamins -consulted RD 2/22 -can have food from home -begin megace trial '400mg'$  BID 8.  ID.   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  Currently on vancomycin and ceftriaxone until 09/08/2022.   9.  Hypertension.  Norvasc 5 mg daily.     -borderline 2/23--obsv today  -08/04/22 some fluctuations but overall stable, monitor for now Vitals:   08/01/22 1509 08/01/22 2007 08/02/22 1508 08/02/22 2000  BP: (!) 134/59 131/77 119/72 (!) 157/74   08/03/22 0259 08/03/22 1957  08/04/22 0426  BP: (!) 144/92 128/70 (!) 147/64    10.  History of alcohol/tobacco/cannabis use.  Provide counseling, monitor for withdrawal 11.  Slow transit Constipation/neurogenic bowel: pt has flatus, intake minimal over last few days.  -encouraged po intake.    -can have food from home  -change colace to senna-s 2 tabs at bedtime             -continue daily miralax 17g - sorbitol 62m as needed -08/04/22 still no BM in several days, increase miralax to BID, restart colace '100mg'$  QD for now 12.  Aortic atherosclerosis             -Consider resume aspirin and atorvastatin on discharge    LOS: 3 days A [place]ACE TO [place]2/24/2024, 12:18 PM

## 2022-08-05 MED ORDER — POLYETHYLENE GLYCOL 3350 17 G PO PACK
17.0000 g | PACK | Freq: Every day | ORAL | Status: DC
  Administered 2022-08-06 – 2022-08-23 (×18): 17 g via ORAL
  Filled 2022-08-05 (×18): qty 1

## 2022-08-05 MED ORDER — SODIUM CHLORIDE 0.9 % IV SOLN
2.0000 g | INTRAVENOUS | Status: DC
  Administered 2022-08-05 – 2022-08-23 (×19): 2 g via INTRAVENOUS
  Filled 2022-08-05 (×20): qty 20

## 2022-08-05 NOTE — Progress Notes (Signed)
Physical Therapy Session Note  Patient Details  Name: Kristen Bautista MRN: [id number] Date of Birth: [birth year]  Today's Date: 08/05/2022 PT Individual Time: 1015-1116 PT Individual Time Calculation (min): 61 min   Short Term Goals: Week 1:  PT Short Term Goal 1 (Week 1): pt will sit EOB with CGA or better PT Short Term Goal 2 (Week 1): Pt will perform STS with mod A consistently PT Short Term Goal 3 (Week 1): Pt will tolerate sitting OOB between sessions for improved activity tolerance  Skilled Therapeutic Interventions/Progress Updates:  Pt was seen bedside in the am sitting up in w/c. During treatment pt was medicated by nursing secondary to cervical pain, 10/10. Treatment initially focused on LE strengthening, utilizing 1.5 and 2.5 ls for increased proprioceptive input, 3 sets x 10 reps each. Pt transferred sit to stand x 3 with min to mod A and verbal cues. While standing worked on upright posture and weight shifting. Pt transferred w/c to edge of bed stand pivot with mod A and verbal cues. Pt transitioned to supine with max A and verbal cues. Pt left sitting up in bed with bed alarm on and all needs within reach.   Therapy Documentation Precautions:  Precautions Precautions: Cervical Precaution Comments: ok for out of brace in bed, for showering, and for short trips to bathroom Required Braces or Orthoses: Cervical Brace Cervical Brace: Hard collar, Other (comment) Restrictions Weight Bearing Restrictions: No General:   Vital Signs:   Pain:  Pt c/o 10/10 cervical pain, medicated by nursing during treatment. Pt states if she is perfectly still and doesn't move she doesn't have any pain.     Therapy/Group: Individual Therapy  Dub Amis 08/05/2022, 1:59 PM

## 2022-08-05 NOTE — Progress Notes (Signed)
Occupational Therapy Session Note  Patient Details  Name: Kristen Bautista MRN: [id number] Date of Birth: [birth year]  Session 1: Today's Date: 08/05/2022 OT Individual Time: [id number] OT Individual Time Calculation (min): 71 min   Session 2: OT Individual Time: [id number] OT Individual Time Calculation (min): 56 min  Short Term Goals: Week 1:  OT Short Term Goal 1 (Week 1): Pt will be able to sit EOB with min A in prep for UB dressing OT Short Term Goal 2 (Week 1): Pt will be able to come forward in prep for transfer with supervision OT Short Term Goal 3 (Week 1): Pt will transfer to toilet/BSC with mod A consistently OT Short Term Goal 4 (Week 1): Pt will thread LB clothing with min A  Skilled Therapeutic Interventions/Progress Updates:    Session 1: Patient agreeable to participate in OT session. Reports 0/10 pain at rest; in bed. Once completing bed mobility and functional transfers, pt verbalizes and demonstrated posterior neck pain and bilateral shoulder pain. Pt states that she experiences pain every time she leans forward to initiate sit to stand which is why is typically goes into extensor tone when standing. Pt received pain meds prior to therapy session. Monitored pain during session.    Patient participated in skilled OT session focusing on functional transfers, LB dressing utilizing AE, and education regarding . Therapist educated pt on use of reacher to assist with LB dressing in order to promote improved functional performance and participation with LB dressing. Pt provided with Max assist, VC to use her vision to know if she's holding onto the reacher and the pants. Total assist provided for change of hospital gown. Aspen collar pads were removed, washed, and clean pads placed. Pt complete all functional transfer bed to [facility] and then [facility] to wheelchair with Min A using stand pvt technique. VC provided for hand placement and sequencing.   Session 2: Patient agreeable to participate  in OT session. Reports 0/10 pain level while resting in bed. Lunch tray has just arrived and nursing is providing set-up of tray for pt to complete self feeding.   Patient participated in skilled OT session focusing on ADL re-training during self-feeding. Therapist assessed pt's ability to complete self feeding with use of built up foam utensils in order to improve increased independence during meal time. Pt provided with 2 additional foam pieces to use with spoon and toothbrush when needed. Pt was able to complete self feeding with SBA when provided set-up of tray. - myofascial release completed to bilateral upper trapezius and scapularis region to decrease fascial restrictions and pain level when completing functional transfers.  - P/ROM, BUE, shoulder flexion, seated, 10X - A/AROM, BUE shoulder punches, 10X - Isometric strengthening, shoulder abduction, elbow extension, gross grasp squeeze, BUE, 5x5"    Therapy Documentation Precautions:  Precautions Precautions: Cervical Precaution Comments: ok for out of brace in bed, for showering, and for short trips to bathroom Required Braces or Orthoses: Cervical Brace Cervical Brace: Hard collar, Other (comment) Restrictions Weight Bearing Restrictions: No   Therapy/Group: Individual Therapy  Ailene Ravel, OTR/L,CBIS  Supplemental OT - [facility] and [facility] Secure Chat Preferred   08/05/2022, 7:48 AM

## 2022-08-05 NOTE — Progress Notes (Signed)
PROGRESS NOTE   Subjective/Complaints: Doing well today, slept well overnight, pain doing ok, had 2 BMs yesterday which were a little oversoftened. Urinating well. No other complaints.   ROS: +neck pain-improving, +paresthesias, +Constipation-resolved. Denies fevers, chills, CP, SOB, abd pain, N/V/D, new/worsening paresthesias/weakness, or any other complaints at this time.     Objective:   No results found. No results for input(s): "WBC", "HGB", "HCT", "PLT" in the last 72 hours.  No results for input(s): "NA", "K", "CL", "CO2", "GLUCOSE", "BUN", "CREATININE", "CALCIUM" in the last 72 hours.   Intake/Output Summary (Last 24 hours) at 08/05/2022 0701 Last data filed at 08/05/2022 0500 Gross per 24 hour  Intake 649 ml  Output 925 ml  Net -276 ml         Physical Exam: Vital Signs Blood pressure 130/63, pulse 73, temperature 98.9 F (37.2 C), temperature source Oral, resp. rate 16, height '5\' 7"'$  (1.702 m), weight 55.7 kg, SpO2 98 %.  Constitutional: No distress . Vital signs reviewed. Sitting at edge of bed HEENT: NCAT, EOMI, oral membranes moist Neck: cervical collar in place Cardiovascular: RRR without murmur. No JVD    Respiratory/Chest: CTA Bilaterally without wheezes or rales. Normal effort    GI/Abdomen: BS +, non-tender, non-distended Ext: no clubbing, cyanosis, or edema Psych: cooperative, slightly odd affect.   PRIOR EXAM: Skin: Clean and intact without signs of breakdown. Wound cdi Neuro:  Alert and oriented x 3. Fair insight and awareness. Functional Memory. Normal language and speech. Cranial nerve exam unremarkable. UE motor 3 prox to 4/5 distally with decreased LT at finger tips bilaterally. LE 3+ prox to 4/5 distally with decreased LT below ankle both feet.  Musculoskeletal: neck tender even in collar with basic head movement    Assessment/Plan: 1. Functional deficits which require 3+ hours per day of  interdisciplinary therapy in a comprehensive inpatient rehab setting. Physiatrist is providing close team supervision and 24 hour management of active medical problems listed below. Physiatrist and rehab team continue to assess barriers to discharge/monitor patient progress toward functional and medical goals  Care Tool:  Bathing        Body parts bathed by helper: Right arm, Left arm, Chest, Abdomen, Front perineal area, Buttocks, Right upper leg, Left upper leg, Right lower leg, Left lower leg, Face     Bathing assist Assist Level: Total Assistance - Patient < 25%     Upper Body Dressing/Undressing Upper body dressing   What is the patient wearing?: Hospital gown only    Upper body assist Assist Level: Total Assistance - Patient < 25%    Lower Body Dressing/Undressing Lower body dressing      What is the patient wearing?: Underwear/pull up, Pants     Lower body assist Assist for lower body dressing: Total Assistance - Patient < 25%     Toileting Toileting    Toileting assist Assist for toileting: Total Assistance - Patient < 25%     Transfers Chair/bed transfer  Transfers assist     Chair/bed transfer assist level: Moderate Assistance - Patient 50 - 74%     Locomotion Ambulation   Ambulation assist   Ambulation activity did not occur: Safety/medical  concerns          Walk 10 feet activity   Assist  Walk 10 feet activity did not occur: Safety/medical concerns        Walk 50 feet activity   Assist Walk 50 feet with 2 turns activity did not occur: Safety/medical concerns         Walk 150 feet activity   Assist Walk 150 feet activity did not occur: Safety/medical concerns         Walk 10 feet on uneven surface  activity   Assist Walk 10 feet on uneven surfaces activity did not occur: Safety/medical concerns         Wheelchair     Assist Is the patient using a wheelchair?: Yes (Pt will use chair, but unable to get to  chair during eval d/t pain)   Wheelchair activity did not occur: Safety/medical concerns         Wheelchair 50 feet with 2 turns activity    Assist    Wheelchair 50 feet with 2 turns activity did not occur: Safety/medical concerns       Wheelchair 150 feet activity     Assist  Wheelchair 150 feet activity did not occur: Safety/medical concerns       Blood pressure 130/63, pulse 73, temperature 98.9 F (37.2 C), temperature source Oral, resp. rate 16, height '5\' 7"'$  (1.702 m), weight 55.7 kg, SpO2 98 %.  Medical Problem List and Plan: 1. Functional deficits secondary to severe cervical stenosis with myelopathy/epidural abscess.  Status post open posterior cervical lateral instrumentation posterior lateral fusion C3-4 4-5 C5-6 as well as laminotomies evacuation of epidural abscess C3-4 07/28/2022.  Cervical collar when out of bed.             -patient may not shower -ELOS/Goals: 13 to 17 days, modified independent and supervision with PT and OT             -Continue CIR therapies including PT, OT             2.  Antithrombotics: -DVT/anticoagulation:  Pharmaceutical: changed heparin to sq lovenox '40mg'$  daily to decrease sticks             -antiplatelet therapy: N/A 3. Pain Management: Oxycodone 5/'10mg'$  q3h PRN, Robaxin '500mg'$  q6h as needed  -is taking prior to therapies with good results.  4. Mood/Behavior/Sleep: Provide emotional support             -antipsychotic agents: N/A 5. Neuropsych/cognition: This patient is capable of making decisions on her own behalf. 6. Skin/Wound Care: Routine skin checks 7. Fluids/Electrolytes/Nutrition: encourage po. Weekly labs starting 08/06/22 -added protein supplement. Continue vitamins -consulted RD 2/22 -can have food from home -begin megace trial '400mg'$  BID 8.  ID.  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  Currently on vancomycin and ceftriaxone until 09/08/2022.   9.  Hypertension.  Norvasc 5 mg daily.     -borderline 2/23--obsv  today  -08/04/22 some fluctuations but overall stable, monitor for now  -08/05/22 stabilizing BP, monitor Vitals:   08/01/22 1509 08/01/22 2007 08/02/22 1508 08/02/22 2000  BP: (!) 134/59 131/77 119/72 (!) 157/74   08/03/22 0259 08/03/22 1957 08/04/22 0426 08/04/22 1503  BP: (!) 144/92 128/70 (!) 147/64 (!) 127/57   08/04/22 1938 08/05/22 0513  BP: 136/68 130/63    10.  History of alcohol/tobacco/cannabis use.  Provide counseling, monitor for withdrawal 11.  Slow transit Constipation/neurogenic bowel: pt has flatus, intake minimal over last few days.  -encouraged  po intake.    -can have food from home  -change colace to senna-s 2 tabs at bedtime             -continue daily miralax 17g - sorbitol 2m as needed -08/04/22 still no BM in several days, increase miralax to BID, restart colace '100mg'$  QD for now -08/05/22 BM x2 overnight, oversoftened, will back down miralax to QD, monitor 12.  Aortic atherosclerosis             -Consider resume aspirin and atorvastatin on discharge    LOS: 4 days A [place]ACE TO [place]2/25/2024, 7:01 AM

## 2022-08-06 DIAGNOSIS — I10 Essential (primary) hypertension: Secondary | ICD-10-CM | POA: Diagnosis not present

## 2022-08-06 DIAGNOSIS — G959 Disease of spinal cord, unspecified: Secondary | ICD-10-CM | POA: Diagnosis not present

## 2022-08-06 DIAGNOSIS — D75839 Thrombocytosis, unspecified: Secondary | ICD-10-CM | POA: Diagnosis not present

## 2022-08-06 DIAGNOSIS — D62 Acute posthemorrhagic anemia: Secondary | ICD-10-CM

## 2022-08-06 DIAGNOSIS — K5900 Constipation, unspecified: Secondary | ICD-10-CM | POA: Diagnosis not present

## 2022-08-06 LAB — CBC
HCT: 28.1 % — ABNORMAL LOW (ref 36.0–46.0)
Hemoglobin: 9.6 g/dL — ABNORMAL LOW (ref 12.0–15.0)
MCH: 29.4 pg (ref 26.0–34.0)
MCHC: 34.2 g/dL (ref 30.0–36.0)
MCV: 85.9 fL (ref 80.0–100.0)
Platelets: 403 10*3/uL — ABNORMAL HIGH (ref 150–400)
RBC: 3.27 MIL/uL — ABNORMAL LOW (ref 3.87–5.11)
RDW: 12.4 % (ref 11.5–15.5)
WBC: 8.7 10*3/uL (ref 4.0–10.5)
nRBC: 0 % (ref 0.0–0.2)

## 2022-08-06 LAB — GLUCOSE, CAPILLARY: Glucose-Capillary: 127 mg/dL — ABNORMAL HIGH (ref 70–99)

## 2022-08-06 LAB — BASIC METABOLIC PANEL
Anion gap: 11 (ref 5–15)
BUN: 15 mg/dL (ref 8–23)
CO2: 27 mmol/L (ref 22–32)
Calcium: 9.4 mg/dL (ref 8.9–10.3)
Chloride: 100 mmol/L (ref 98–111)
Creatinine, Ser: 0.84 mg/dL (ref 0.44–1.00)
GFR, Estimated: >60 mL/min (ref >60)
Glucose, Bld: 113 mg/dL — ABNORMAL HIGH (ref 70–99)
Potassium: 4 mmol/L (ref 3.5–5.1)
Sodium: 138 mmol/L (ref 135–145)

## 2022-08-06 NOTE — Progress Notes (Signed)
PROGRESS NOTE   Subjective/Complaints: Occasional pain in neck with therapy- controlled with medications.   ROS: +neck pain-improving, +paresthesias, +Constipation-resolved. Denies fevers, chills, CP, SOB, abd pain, N/V/D, HA, new/worsening paresthesias/weakness, or any other complaints at this time.     Objective:   No results found. Recent Labs    08/06/22 0520  WBC 8.7  HGB 9.6*  HCT 28.1*  PLT 403*    Recent Labs    08/06/22 0520  NA 138  K 4.0  CL 100  CO2 27  GLUCOSE 113*  BUN 15  CREATININE 0.84  CALCIUM 9.4     Intake/Output Summary (Last 24 hours) at 08/06/2022 1340 Last data filed at 08/06/2022 0800 Gross per 24 hour  Intake 500 ml  Output 1050 ml  Net -550 ml         Physical Exam: Vital Signs Blood pressure (!) 124/56, pulse 77, temperature 98.6 F (37 C), temperature source Oral, resp. rate 18, height '5\' 7"'$  (1.702 m), weight 55.7 kg, SpO2 97 %.  Constitutional: No distress . Vital signs reviewed. Lying in bed HEENT: NCAT, Conjugate gaze, oral membranes moist Neck: cervical collar in place Cardiovascular: RRR without murmur. No JVD    Respiratory/Chest: CTA Bilaterally without wheezes or rales. Normal effort    GI/Abdomen: soft, BS +, non-tender, non-distended Ext: no clubbing, cyanosis, or edema Psych: cooperative, a little flat   PRIOR EXAM: Skin: Clean and intact without signs of breakdown. Wound cdi Neuro:  Alert and oriented x 3. Fair insight and awareness. Functional Memory. Normal language and speech. Cranial nerve exam unremarkable. UE motor 3 prox to 4/5 distally with decreased LT at finger tips bilaterally. LE 3+ prox to 4/5 distally with decreased LT below ankle both feet.  Musculoskeletal: neck tender even in collar with basic head movement    Assessment/Plan: 1. Functional deficits which require 3+ hours per day of interdisciplinary therapy in a comprehensive inpatient  rehab setting. Physiatrist is providing close team supervision and 24 hour management of active medical problems listed below. Physiatrist and rehab team continue to assess barriers to discharge/monitor patient progress toward functional and medical goals  Care Tool:  Bathing        Body parts bathed by helper: Right arm, Left arm, Chest, Abdomen, Front perineal area, Buttocks, Right upper leg, Left upper leg, Right lower leg, Left lower leg, Face     Bathing assist Assist Level: Total Assistance - Patient < 25%     Upper Body Dressing/Undressing Upper body dressing   What is the patient wearing?: Hospital gown only    Upper body assist Assist Level: Total Assistance - Patient < 25%    Lower Body Dressing/Undressing Lower body dressing      What is the patient wearing?: Underwear/pull up, Pants     Lower body assist Assist for lower body dressing: Total Assistance - Patient < 25%     Toileting Toileting    Toileting assist Assist for toileting: Total Assistance - Patient < 25%     Transfers Chair/bed transfer  Transfers assist     Chair/bed transfer assist level: Moderate Assistance - Patient 50 - 74%     Locomotion Ambulation  Ambulation assist   Ambulation activity did not occur: Safety/medical concerns          Walk 10 feet activity   Assist  Walk 10 feet activity did not occur: Safety/medical concerns        Walk 50 feet activity   Assist Walk 50 feet with 2 turns activity did not occur: Safety/medical concerns         Walk 150 feet activity   Assist Walk 150 feet activity did not occur: Safety/medical concerns         Walk 10 feet on uneven surface  activity   Assist Walk 10 feet on uneven surfaces activity did not occur: Safety/medical concerns         Wheelchair     Assist Is the patient using a wheelchair?: Yes (Pt will use chair, but unable to get to chair during eval d/t pain) Type of Wheelchair:  Manual Wheelchair activity did not occur: Safety/medical concerns         Wheelchair 50 feet with 2 turns activity    Assist    Wheelchair 50 feet with 2 turns activity did not occur: Safety/medical concerns       Wheelchair 150 feet activity     Assist  Wheelchair 150 feet activity did not occur: Safety/medical concerns       Blood pressure (!) 124/56, pulse 77, temperature 98.6 F (37 C), temperature source Oral, resp. rate 18, height '5\' 7"'$  (1.702 m), weight 55.7 kg, SpO2 97 %.  Medical Problem List and Plan: 1. Functional deficits secondary to severe cervical stenosis with myelopathy/epidural abscess.  Status post open posterior cervical lateral instrumentation posterior lateral fusion C3-4 4-5 C5-6 as well as laminotomies evacuation of epidural abscess C3-4 07/28/2022.  Cervical collar when out of bed.             -patient may not shower -ELOS/Goals: 13 to 17 days, modified independent and supervision with PT and OT             -Continue CIR therapies including PT, OT   -Team conference tomorrow            2.  Antithrombotics: -DVT/anticoagulation:  Pharmaceutical: changed heparin to sq lovenox '40mg'$  daily to decrease sticks             -antiplatelet therapy: N/A 3. Pain Management: Oxycodone 5/'10mg'$  q3h PRN, Robaxin '500mg'$  q6h as needed  -is taking prior to therapies with good results.  4. Mood/Behavior/Sleep: Provide emotional support             -antipsychotic agents: N/A 5. Neuropsych/cognition: This patient is capable of making decisions on her own behalf. 6. Skin/Wound Care: Routine skin checks 7. Fluids/Electrolytes/Nutrition: encourage po. Weekly labs starting 08/06/22 -added protein supplement. Continue vitamins -consulted RD 2/22 -can have food from home -begin megace trial '400mg'$  BID 8.  ID.  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  Currently on vancomycin and ceftriaxone until 09/08/2022.   9.  Hypertension.  Norvasc 5 mg daily.     -borderline 2/23--obsv  today  -2/26 controlled overall, continue to monitor Vitals:   08/01/22 1509 08/01/22 2007 08/02/22 1508 08/02/22 2000  BP: (!) 134/59 131/77 119/72 (!) 157/74   08/03/22 0259 08/03/22 1957 08/04/22 0426 08/04/22 1503  BP: (!) 144/92 128/70 (!) 147/64 (!) 127/57   08/04/22 1938 08/05/22 0513 08/05/22 1938 08/06/22 0442  BP: 136/68 130/63 125/66 (!) 124/56    10.  History of alcohol/tobacco/cannabis use.  Provide counseling, monitor for withdrawal 11.  Slow transit Constipation/neurogenic bowel: pt has flatus, intake minimal over last few days.  -encouraged po intake.    -can have food from home  -change colace to senna-s 2 tabs at bedtime             -continue daily miralax 17g - sorbitol 36m as needed -08/04/22 still no BM in several days, increase miralax to BID, restart colace '100mg'$  QD for now -08/05/22 BM x2 overnight, oversoftened, will back down miralax to QD, monitor -2/26 LBM night before last, continue to monitor, has been continent even when loose stool 12.  Aortic atherosclerosis             -Consider resume aspirin and atorvastatin on discharge 13. ABLA   -2/26 stable at HGB 9.6 14. Thrombocytosis  -Likely reactive, no intervention at this time    LOS: 5 days A FACE TO FACE EVALUATION WAS PERFORMED  YJennye Boroughs2/26/2024, 1:40 PM

## 2022-08-06 NOTE — Progress Notes (Signed)
Physical Therapy Session Note  Patient Details  Name: Kristen Bautista MRN: [id number] Date of Birth: [birth year]  Today's Date: 08/06/2022 PT Missed Time: 75 Minutes Missed Time Reason: Patient fatigue;Pain;Patient unwilling to participate  Attempted to see patient for scheduled PT treatment session, however patient reported too much pain and fatigue in order to participate in therapy this afternoon. Therapist provided education and encouragement, however patient continued to refuse any OOB activity. Patient left supine in bed with call bell within reach and all needs met. Patient had missed 75 minutes of PT session at this time.   [place] 08/06/2022, 8:00 AM

## 2022-08-06 NOTE — Progress Notes (Signed)
Physical Therapy Session Note  Patient Details  Name: Kristen Bautista MRN: [id number] Date of Birth: 1955/11/07  Today's Date: 08/06/2022 PT Individual Time: 0832-0929 PT Individual Time Calculation (min): 57 min   Short Term Goals: Week 1:  PT Short Term Goal 1 (Week 1): pt will sit EOB with CGA or better PT Short Term Goal 2 (Week 1): Pt will perform STS with mod A consistently PT Short Term Goal 3 (Week 1): Pt will tolerate sitting OOB between sessions for improved activity tolerance  Skilled Therapeutic Interventions/Progress Updates:     Pt received sitting in bed with HOB fully elevated. Agrees to therapy and reports pain in neck, but states she took pain meds 30 minutes prior to session. PT provides education regarding body mechanics, rest breaks, and mobility to manage pain. Pt performs supine to sit slowly with bed features and cues for positioning at EOB, but does not require physical assistance. Pt provided with increased time sitting at EOB to acclimate to upright positioning. Pt performs x20 total alternating LAQs prior to attempting mobility. Pt performs sit to stand with minA and cues for initiation and sequencing, then completes stand step transfer to [facility] with modA due to consistent R lateral lean and ataxic movements, with slight extensor tone. WC transport to gym. Following rest break, pt stands again with minA, then ambulates x10' with modA and cues for step sequencing, as well as manual facilitation of lateral weight shifting and progression of gait. Pt reports slight increase in neck pain following ambulation but does not seem debilitating. Pt ambulates from mat to WC without AD, with modA increasing to maxA due to lateral LOB. WC transport back to room. Stand step to bed with modA. MinA for sit to supine. Left with all needs within reach.   Therapy Documentation Precautions:  Precautions Precautions: Cervical Precaution Comments: ok for out of brace in bed, for showering, and  for short trips to bathroom Required Braces or Orthoses: Cervical Brace Cervical Brace: Hard collar, Other (comment) Restrictions Weight Bearing Restrictions: No  Therapy/Group: Individual Therapy  Breck Coons, PT, DPT 08/06/2022, 3:51 PM

## 2022-08-06 NOTE — Progress Notes (Signed)
Physical Therapy Session Note  Patient Details  Name: Kristen Bautista MRN: [id number] Date of Birth: 04-11-1956  Today's Date: 08/06/2022 PT Individual Time: 1255-1350 PT Individual Time Calculation (min): 55 min   Short Term Goals: Week 1:  PT Short Term Goal 1 (Week 1): pt will sit EOB with CGA or better PT Short Term Goal 2 (Week 1): Pt will perform STS with mod A consistently PT Short Term Goal 3 (Week 1): Pt will tolerate sitting OOB between sessions for improved activity tolerance  Skilled Therapeutic Interventions/Progress Updates:    Chart reviewed and pt agreeable to therapy. Pt received semi-reclined in bed with high c/o pain in neck that was not quantified. Session focused on functional transfers to promote safe home access. Pt initiated session with slow transfer to EOB using CGA + bed features. Of note, pt determined to reach EOB without support despite significant pn during movement. At EOB, pt c/o fatigue, but agreeable to sit to stand practice. Pt c/o heavy feeling of legs and stated she did not feel comfortable with RW. Pt agreeable to use STEDY as practice for sit to stand technique. PT retrieved STEDY and pt practiced mutliple sit to stands in STEDY with MinA progressing to [place]. Pt noted to have challenge with forward flexion during stand, with PT reminding pt not to pull significantly to stand. Pt able to stand but c/o pain when standing. Pt began to cry at EOB about pn. Pt then returned to bed with MinA. Pt required ModA for R roll and minA for L roll for positioning. RN entered room for bladder scan. Pt then rolled again with same assist level for brief change. At end of session, pt was left semi-reclined in bed with alarm engaged, four rails up per request, nurse call bell and all needs in reach.     Therapy Documentation Precautions:  Precautions Precautions: Cervical Precaution Comments: ok for out of brace in bed, for showering, and for short trips to bathroom Required  Braces or Orthoses: Cervical Brace Cervical Brace: Hard collar, Other (comment) Restrictions Weight Bearing Restrictions: No      Therapy/Group: Individual Therapy  Marquette Old, PT, DPT 08/06/2022, 2:36 PM

## 2022-08-07 DIAGNOSIS — G959 Disease of spinal cord, unspecified: Secondary | ICD-10-CM | POA: Diagnosis not present

## 2022-08-07 NOTE — Patient Care Conference (Signed)
Inpatient RehabilitationTeam Conference and Plan of Care Update Date: 08/07/2022   Time: 11:02 AM    Patient Name: Kristen Bautista      Medical Record Number: [id number]  Date of Birth: Nov 23, 1955 Sex: Female         Room/Bed: 4M02C/4M02C-01 Payor Info: Payor: [place] / Plan: [place] / Product Type: *No Product type* /    Admit Date/Time:  08/01/2022  3:08 PM  Primary Diagnosis:  Cervical myelopathy [facility])  Hospital Problems: Principal Problem:   Cervical myelopathy ([place]) Active Problems:   Moderate malnutrition [facility])    Expected Discharge Date: Expected Discharge Date: 08/23/22  Team Members Present: Physician leading conference: Dr. Courtney Heys Social Worker Present: Loralee Pacas, [place] Nurse Present: Tacy Learn, RN PT Present: Verl Dicker, PT OT Present: Willeen Cass, OT PPS Coordinator present : Gunnar Fusi, SLP     Current Status/Progress Goal Weekly Team Focus  Bowel/Bladder   Incontinent of bladder and continent of bowel. LBM 2./24   Continent of Bowel and Blader   Assess  toileting q shift and as needed    Swallow/Nutrition/ Hydration               ADL's   max A- total A for ADLs, min to mod A for transfers and  sit to stands; abl eto self feed wtih setup and built up utensil   min A overall   pain management with movement, sit to stands, getting up to [facility] and tolerating transfers, standign balance and ADL retraining    Mobility   bed min, min STS, mod SPT, gait mod x 10 ft no AD   Supervision transfers, min A gait  pain managmenet, transfers, gait, coordination/balance    Communication                Safety/Cognition/ Behavioral Observations               Pain   Rates pain 6 out 10. prn pain medications: oxy 5-'10mg'$  q 3hrs as needed and prn tylenol   pain < or =3   Assess pain q shift and as needed    Skin   Surgical incision posterior neck, aspen collar   Remain free of infection and surgical  incision remain clean, dry intact and continue healing  Assess skin q shift and as needed      Discharge Planning:  D/c to home with her cousin Kristen Bautista who will provide 24/7 care. Challenges with obtaining Iv abx due to cost; pt on IV abx until 3/30 (ceftriaxone and vanc). SW will  confirm there are no barriers to discharge.   Team Discussion: Cervical myelopathy. Incontinent B/B. Okay for purwick at HS. Working on managing pain. Incision is without drainage. IV ABT ends 03/30. Megace added due to poor PO intake. Therapies limited by pain. Does not have pain in supine position.  Patient on target to meet rehab goals: yes, working towards goals  *See Care Plan and progress notes for long and short-term goals.   Revisions to Treatment Plan:  Medication adjustments, pain control, purwick at HS  Teaching Needs: Medications, safety, self care, gait/transfer training, skin care, etc.   Current Barriers to Discharge: Decreased caregiver support, IV antibiotics, Incontinence, Wound care, Lack of/limited family support, Insurance for SNF coverage, and Medication compliance  Possible Resolutions to Barriers: Family education, nursing education, order recommended DME     Medical Summary Current Status: only has pian wiht movement- tears down face- extensor tone as  well- smokes and uses canniabis- 18 beers/day-? min-mod A for transfers-  Barriers to Discharge: Complicated Wound;Behavior/Mood;Spasticity;Self-care education;Medical stability;Uncontrolled Pain;Inadequate Nutritional Intake;Incontinence  Barriers to Discharge Comments: mod A gait without AD- going home wiht cousin Kristen Bautista- 24/7 until other cousin comes- insurance very expensive to cover- and cannot find nursing to get to care for IV ABX 3/30 last day- Possible Resolutions to [employer] Focus: main barriers- pain; now inconitnent- retaining, but not enough to cath- 08/23/22 for now-   Continued Need for Acute Rehabilitation Level  of Care: The patient requires daily medical management by a physician with specialized training in physical medicine and rehabilitation for the following reasons: Direction of a multidisciplinary physical rehabilitation program to maximize functional independence : Yes Medical management of patient stability for increased activity during participation in an intensive rehabilitation regime.: Yes Analysis of laboratory values and/or radiology reports with any subsequent need for medication adjustment and/or medical intervention. : Yes   I attest that I was present, lead the team conference, and concur with the assessment and plan of the team.   Ernest Pine 08/07/2022, 3:18 PM

## 2022-08-07 NOTE — Progress Notes (Signed)
Physical Therapy Session Note  Patient Details  Name: Kristen Bautista MRN: [id number] Date of Birth: 05/09/1956  Today's Date: 08/07/2022 PT Individual Time: 0806-0852 PT Individual Time Calculation (min): 46 min   Short Term Goals: Week 1:  PT Short Term Goal 1 (Week 1): pt will sit EOB with CGA or better PT Short Term Goal 2 (Week 1): Pt will perform STS with mod A consistently PT Short Term Goal 3 (Week 1): Pt will tolerate sitting OOB between sessions for improved activity tolerance   Skilled Therapeutic Interventions/Progress Updates:  Patient supine in bed with HOB at upright angle on entrance to room. Hard neck brace donned. Patient alert and agreeable to PT session, but is finishing breakfast. RN present and providing morning meds as well as setting up IV abx treatment.   Patient with no pain complaint at start of session. But does relate that BLE feels heavy and glutes feel like "jelly". +2 required throughout session for safety, IV pole mgmt, w/c follow.   Therapeutic Activity: Bed Mobility: Pt performed supine --> sit with decreased control of effort to bring BLE to EOB, Rolls to R side with use of bed rail and pushes self to upright seated position on EOB all with supervision/ light, intermittent CGA. No cueing for technique, min cueing for initiation of effort.  At end of session, pt is able to bring each LE to bed surface in two efforts. Maintains upright position of UB and good neck posture. Requires MaxA +2 for positioning toward HOB.  Transfers: Pt performed sit<>stand transfers with and stand pivot transfers throughout session with ***. Provided verbal cues for***.  Gait Training:  Pt ambulated *** ft using *** with ***. Demonstrated ***. Provided vc/ tc for ***.  Patient *** at end of session with brakes locked, *** alarm set, and all needs within reach.   Therapy Documentation Precautions:  Precautions Precautions: Cervical Precaution Comments: ok for out of  brace in bed, for showering, and for short trips to bathroom Required Braces or Orthoses: Cervical Brace Cervical Brace: Hard collar, Other (comment) Restrictions Weight Bearing Restrictions: No General:   Vital Signs:   Pain: Pain Assessment Pain Scale: 0-10 Pain Score: 8  Pain Type: Acute pain Pain Location: Neck Pain Descriptors / Indicators: Aching Pain Frequency: Constant Pain Onset: On-going Pain Intervention(s): Medication (See eMAR)  Therapy/Group: Individual Therapy  Alger Simons PT, DPT, CSRS 08/07/2022, 10:12 AM

## 2022-08-07 NOTE — Progress Notes (Signed)
PROGRESS NOTE   Subjective/Complaints:  Pt reports having some pain in neck but meds usually help.  Getting PICC line dressing changed by IV nurse.  Wants purewick- upset that "cannot have it"- we discussed that she will unlikely have at home, and so therefore it trains her to void without getting up.   LBM 2 days ago per pt- denies constipation.   ROS:   Pt denies SOB, abd pain, CP, N/V/C/D, and vision changes   Objective:   No results found. Recent Labs    08/06/22 0520  WBC 8.7  HGB 9.6*  HCT 28.1*  PLT 403*   Recent Labs    08/06/22 0520  NA 138  K 4.0  CL 100  CO2 27  GLUCOSE 113*  BUN 15  CREATININE 0.84  CALCIUM 9.4    Intake/Output Summary (Last 24 hours) at 08/07/2022 0841 Last data filed at 08/06/2022 1439 Gross per 24 hour  Intake 240 ml  Output 425 ml  Net -185 ml        Physical Exam: Vital Signs Blood pressure 128/61, pulse 87, temperature 98 F (36.7 C), temperature source Oral, resp. rate 18, height '5\' 7"'$  (1.702 m), weight 55.7 kg, SpO2 100 %.    General: awake, alert, appropriate, sitting up in bed; upset about purewick, in spite of having one right now; initially seen with IV nurse changing PICC dressing,  NAD HENT: conjugate gaze; oropharynx moist CV: regular rate and rhythm; no JVD Pulmonary: CTA B/L; no W/R/R- good air movement GI: soft, NT, ND, (+)BS- slightly hypoactive  Psychiatric: irritable and upset about Purewick  Neurological: Ox3-  PRIOR EXAM: Skin: Clean and intact without signs of breakdown. Wound cdi Neuro:  Alert and oriented x 3. Fair insight and awareness. Functional Memory. Normal language and speech. Cranial nerve exam unremarkable. UE motor 3 prox to 4/5 distally with decreased LT at finger tips bilaterally. LE 3+ prox to 4/5 distally with decreased LT below ankle both feet.  Musculoskeletal: neck tender even in collar with basic head movement     Assessment/Plan: 1. Functional deficits which require 3+ hours per day of interdisciplinary therapy in a comprehensive inpatient rehab setting. Physiatrist is providing close team supervision and 24 hour management of active medical problems listed below. Physiatrist and rehab team continue to assess barriers to discharge/monitor patient progress toward functional and medical goals  Care Tool:  Bathing        Body parts bathed by helper: Right arm, Left arm, Chest, Abdomen, Front perineal area, Buttocks, Right upper leg, Left upper leg, Right lower leg, Left lower leg, Face     Bathing assist Assist Level: Total Assistance - Patient < 25%     Upper Body Dressing/Undressing Upper body dressing   What is the patient wearing?: Hospital gown only    Upper body assist Assist Level: Total Assistance - Patient < 25%    Lower Body Dressing/Undressing Lower body dressing      What is the patient wearing?: Underwear/pull up, Pants     Lower body assist Assist for lower body dressing: Total Assistance - Patient < 25%     Toileting Toileting    Toileting assist  Assist for toileting: Total Assistance - Patient < 25%     Transfers Chair/bed transfer  Transfers assist     Chair/bed transfer assist level: Moderate Assistance - Patient 50 - 74%     Locomotion Ambulation   Ambulation assist   Ambulation activity did not occur: Safety/medical concerns          Walk 10 feet activity   Assist  Walk 10 feet activity did not occur: Safety/medical concerns        Walk 50 feet activity   Assist Walk 50 feet with 2 turns activity did not occur: Safety/medical concerns         Walk 150 feet activity   Assist Walk 150 feet activity did not occur: Safety/medical concerns         Walk 10 feet on uneven surface  activity   Assist Walk 10 feet on uneven surfaces activity did not occur: Safety/medical concerns         Wheelchair     Assist Is  the patient using a wheelchair?: Yes (Pt will use chair, but unable to get to chair during eval d/t pain) Type of Wheelchair: Manual Wheelchair activity did not occur: Safety/medical concerns         Wheelchair 50 feet with 2 turns activity    Assist    Wheelchair 50 feet with 2 turns activity did not occur: Safety/medical concerns       Wheelchair 150 feet activity     Assist  Wheelchair 150 feet activity did not occur: Safety/medical concerns       Blood pressure 128/61, pulse 87, temperature 98 F (36.7 C), temperature source Oral, resp. rate 18, height '5\' 7"'$  (1.702 m), weight 55.7 kg, SpO2 100 %.  Medical Problem List and Plan: 1. Functional deficits secondary to severe cervical stenosis with myelopathy/epidural abscess.  Status post open posterior cervical lateral instrumentation posterior lateral fusion C3-4 4-5 C5-6 as well as laminotomies evacuation of epidural abscess C3-4 07/28/2022.  Cervical collar when out of bed.             -patient may not shower -ELOS/Goals: 13 to 17 days, modified independent and supervision with PT and OT           Con't CIR PT and OT- team conference today to determine length of stay 2.  Antithrombotics: -DVT/anticoagulation:  Pharmaceutical: changed heparin to sq lovenox '40mg'$  daily to decrease sticks             -antiplatelet therapy: N/A 3. Pain Management: Oxycodone 5/'10mg'$  q3h PRN, Robaxin '500mg'$  q6h as needed  -is taking prior to therapies with good results.   2/27- pain controlled- no complaints- con't regimen 4. Mood/Behavior/Sleep: Provide emotional support             -antipsychotic agents: N/A 5. Neuropsych/cognition: This patient is capable of making decisions on her own behalf. 6. Skin/Wound Care: Routine skin checks 7. Fluids/Electrolytes/Nutrition: encourage po. Weekly labs starting 08/06/22 -added protein supplement. Continue vitamins -consulted RD 2/22 -can have food from home -begin megace trial '400mg'$  BID 8.  ID.   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  Currently on vancomycin and ceftriaxone until 09/08/2022.    2/27- last trough 13- per pharmacy 9.  Hypertension.  Norvasc 5 mg daily.     -borderline 2/23--obsv today  -2/27- BP overall controlled- con't regimen Vitals:   08/02/22 2000 08/03/22 0259 08/03/22 1957 08/04/22 0426  BP: (!) 157/74 (!) 144/92 128/70 (!) 147/64   08/04/22 1503 08/04/22 1938  08/05/22 0513 08/05/22 1938  BP: (!) 127/57 136/68 130/63 125/66   08/06/22 0442 08/06/22 1651 08/06/22 2100 08/07/22 0559  BP: (!) 124/56 (!) 120/54 120/62 128/61    10.  History of alcohol/tobacco/cannabis use.  Provide counseling, monitor for withdrawal 11.  Slow transit Constipation/neurogenic bowel: pt has flatus, intake minimal over last few days.  -encouraged po intake.    -can have food from home  -change colace to senna-s 2 tabs at bedtime             -continue daily miralax 17g - sorbitol 75m as needed -08/04/22 still no BM in several days, increase miralax to BID, restart colace '100mg'$  QD for now -08/05/22 BM x2 overnight, oversoftened, will back down miralax to QD, monitor -2/26 LBM night before last, continue to monitor, has been continent even when loose stool 2/27- says LBM 2 days ago- denies constipation 12.  Aortic atherosclerosis             -Consider resume aspirin and atorvastatin on discharge 13. ABLA   -2/26 stable at HGB 9.6 14. Thrombocytosis  -Likely reactive, no intervention at this time 15. Urinary incontinence  2/27- pt insistent to use Purewick- had one last night and said she doesn't want to get wet- since has difficulty holding her urine- will order Purewick for now- d/w nursing   I spent a total of  41   minutes on total care today- >50% coordination of care- due to  D/w team about Purewick; prolonged time in room due to needing to void; also team conference to determine length of stay    LOS: 6 days A FACE TO FACE EVALUATION WAS PERFORMED  Ermin Parisien 08/07/2022, 8:41 AM

## 2022-08-07 NOTE — Progress Notes (Signed)
Occupational Therapy Session Note  Patient Details  Name: Kristen Bautista MRN: [id number] Date of Birth: [birth year]  Today's Date: 08/07/2022 OT Individual Time: 1050-1120 OT Individual Time Calculation (min): 30 min    Short Term Goals: Week 1:  OT Short Term Goal 1 (Week 1): Pt will be able to sit EOB with min A in prep for UB dressing OT Short Term Goal 2 (Week 1): Pt will be able to come forward in prep for transfer with supervision OT Short Term Goal 3 (Week 1): Pt will transfer to toilet/BSC with mod A consistently OT Short Term Goal 4 (Week 1): Pt will thread LB clothing with min A  Skilled Therapeutic Interventions/Progress Updates:    Pt received sitting up with no c/o pain, agreeable to OT session focused on BUE [facility] and functional reaching. She was provided with a peg board task with large pegs and instructed to copy a pattern. Min facilitation provided at the R elbow when reaching forward and requiring elbow extension. Her RUE grasp was uncoordinated and effortful but overall effective in placing 20+ pegs. Education provided and cueing to reduce trap compensation on the R. Ended with facilitated reaching forward and focus on elbow extension, requiring mod facilitation overall and cueing for reducing lateral lean. Pt left sitting up with all needs met.    Therapy Documentation Precautions:  Precautions Precautions: Cervical Precaution Comments: ok for out of brace in bed, for showering, and for short trips to bathroom Required Braces or Orthoses: Cervical Brace Cervical Brace: Hard collar, Other (comment) Restrictions Weight Bearing Restrictions: No   Therapy/Group: Individual Therapy  [name] 08/07/2022, 7:59 AM

## 2022-08-07 NOTE — Progress Notes (Addendum)
Patient ID: Kristen Bautista, female   DOB: August 08, 1955, 67 y.o.   MRN: [id number]  SW met with pt in room to provide updates from team conference, and discuss cost of IV abx. Pt reports she is unable to provide weekly cost of medication. SW explained will follow-up with infusion company to discuss financial assistance. She is aware SW will follow-up with her son and cousin.   1208-SW spoke with Kristen Bautista/[employer](p: [phone]) to discuss financial assistance. Reports her supervisor is out today, however, will submit request. SW shared end date for IV abx and medications needed.  *She requested clinical or order. SW shared will work on getting order, however, will fax over recent ID note.    1213-SW spoke with [name] and cousin Kristen Bautista separately to share above details. [name] is ware SW will f/u about scheduling family edu next week. [name] would like to know if pt can be switched to oral IV abx. SW shared will pass along to medical team to get an update, as well, his other medical questions as well. Both are aware there will be updates on plan of care.   Per ID, will continue to follow and will determine if able to switch to oral abx.   Declined HHAs Ashley/[facility]- f/u next week to see if any updates Cheryl/[facility]- not in network Amy/[facility]- out of network Angie/[facility]- not in network Calvin/[facility]- due to meds Kelly/[facility]- out of network Cory/[facility]- declined due to nursing needs  Loralee Pacas, MSW, [place] Office: [phone] Cell: [phone] Fax: [phone]

## 2022-08-07 NOTE — Progress Notes (Signed)
Physical Therapy Session Note  Patient Details  Name: Kristen Bautista MRN: [id number] Date of Birth: 1955/11/30  Today's Date: 08/07/2022 PT Individual Time: [id number] PT Individual Time Calculation (min): 59 min   Short Term Goals: Week 1:  PT Short Term Goal 1 (Week 1): pt will sit EOB with CGA or better PT Short Term Goal 2 (Week 1): Pt will perform STS with mod A consistently PT Short Term Goal 3 (Week 1): Pt will tolerate sitting OOB between sessions for improved activity tolerance  Skilled Therapeutic Interventions/Progress Updates:     Pt received seated in [facility] and agrees to therapy. Reports pain in neck but also reports having pain meds shortly prior to therapy session. PT provides rest breaks as needed to manage pain. WC transport to gym. Pt performs x20 alternating LAQs prior to mobility. PT cues for slow concentric/eccentric control of movement. Pt stands with minA and cues for hand placement, body mechanics, and sequencing. Immediately upon standing pt requires modA due posterior-lateral LOB. PT provides manual cues for midline orientation, then pt ambulates x60' with modA and cues for posture and lateral weight shifting. Pt initially has very short, ataxic strides, but improves stride length with time and cueing. Following rest break, pt stands and ambulates x175' with same cues and assistance level. PT adds 4lb ankle weights to provide additional NM feedback as well as increased strengthening. Pt completes x20 LAQs prior to ambulation to acclimate to additional weight. Pt stands with modA and cues for hand placement and initiation. Pt ambulates x175' without AD, with 4lb ankle weights and minA/modA. Pt's gait pattern appears significantly less ataxic with ankle weights. Following rest break, pt ambulates final bout of x175' with same assistance and cues provided. WC transport back to room. Stand step to bed with minA. NT notified of pt's mobility status and encouraged not to place  purewick, as pt is capable of transferring to toilet. Pt left seated at EOB with NT present.   Therapy Documentation Precautions:  Precautions Precautions: Cervical Precaution Comments: ok for out of brace in bed, for showering, and for short trips to bathroom Required Braces or Orthoses: Cervical Brace Cervical Brace: Hard collar, Other (comment) Restrictions Weight Bearing Restrictions: No    Therapy/Group: Individual Therapy  Breck Coons, PT, DPT 08/07/2022, 2:27 PM

## 2022-08-07 NOTE — Progress Notes (Signed)
Pharmacy Antibiotic Note  Kristen Bautista is a 67 y.o. female admitted on 08/01/2022 with cervical epidural abscess.  Pharmacy has been consulted for vancomycin dosing.  Patient is also on Rocephin.  Last Scr (2/26) 0.84 mg/dL  Plan: Continue vanc '500mg'$  IV Q12H Continue Rocephin 2gm IV Q24H per MD Monitor renal fxn, clinical progress, weekly vanc levels (next due 08/08/2022 w/ AM dose) Per ID, plan for 6 weeks of abx from OR on 07/28/22 (End date: 09/08/22)  Height: '5\' 7"'$  (170.2 cm) Weight: 55.7 kg (122 lb 12.7 oz) IBW/kg (Calculated) : 61.6  Temp (24hrs), Avg:98.6 F (37 C), Min:98 F (36.7 C), Max:99 F (37.2 C)  Recent Labs  Lab 08/01/22 0322 08/01/22 2108 08/02/22 0448 08/06/22 0520  WBC  --  8.9 8.5 8.7  CREATININE 0.80 0.71 0.71 0.84  VANCOTROUGH 13*  --   --   --      Estimated Creatinine Clearance: 57.9 mL/min (by C-G formula based on SCr of 0.84 mg/dL).    No Known Allergies  Antimicrobials: Vanc 2/16 >> 09/08/22 Zosyn 2/16 >>2/18 CTX 2/18 >> 09/08/22  Monitoring: 2/18 VP/VT 33/21 on '750mg'$  q12 >> decr '500mg'$  q12 2/21 VT 13 (estimated AUC 480) >> no change 2/28 VT --  Microbiology Results: 2/16 MRSA PCR - negative 2/16 BCx - negative 2/17 cervical tissue - ngtd  Vaughan Basta BS, PharmD, BCPS Clinical Pharmacist 08/07/2022 7:02 AM  Contact: [phone] after 3 PM  "Be curious, not judgmental..." -Jamal Maes

## 2022-08-07 NOTE — Plan of Care (Signed)
  Problem: SCI BLADDER ELIMINATION Goal: RH STG MANAGE BLADDER WITH ASSISTANCE Description: STG Manage Bladder With min Assistance Outcome: Not Progressing; requesting foe purewick at hs

## 2022-08-07 NOTE — Progress Notes (Signed)
Occupational Therapy Session Note  Patient Details  Name: Kristen Bautista MRN: [id number] Date of Birth: 1956-01-18  Today's Date: 08/07/2022 OT Individual Time: 0920-1000 OT Individual Time Calculation (min): 40 min    Short Term Goals: Week 1:  OT Short Term Goal 1 (Week 1): Pt will be able to sit EOB with min A in prep for UB dressing OT Short Term Goal 2 (Week 1): Pt will be able to come forward in prep for transfer with supervision OT Short Term Goal 3 (Week 1): Pt will transfer to toilet/BSC with mod A consistently OT Short Term Goal 4 (Week 1): Pt will thread LB clothing with min A  Skilled Therapeutic Interventions/Progress Updates:  Pt received eating breakfast at bed-level for skilled OT session with focus on BADL retraining. Pt agreeable to interventions, demonstrating overall pleasant mood. Pt reported 2/10 pain, stating "I'm having a blessed day" in reference to pain. OT offering intermediate rest breaks and positioning suggestions throughout session to address pain/fatigue and maximize participation/safety in session.   Pt manages breakfast utensils and containers with supervision+ integration of BUEs. Pt then dependent for gown change due to time constraints, and patient moving through remainder of breakfast slowly. Using stedy, pt performs x3 STS transfers with cuing for forward flexion, patient stating "I'm moving in a way that won't hurt my back." Purewick left in place per patient request.   Pt remained sitting in [facility] with all immediate needs met at end of session. Pt continues to be appropriate for skilled OT intervention to promote further functional independence.   Therapy Documentation Precautions:  Precautions Precautions: Cervical Precaution Comments: ok for out of brace in bed, for showering, and for short trips to bathroom Required Braces or Orthoses: Cervical Brace Cervical Brace: Hard collar, Other (comment) Restrictions Weight Bearing Restrictions:  No   Therapy/Group: Individual Therapy  Maudie Mercury, OTR/L, MSOT  08/07/2022, 8:01 AM

## 2022-08-08 ENCOUNTER — Other Ambulatory Visit (HOSPITAL_COMMUNITY): Payer: Self-pay

## 2022-08-08 DIAGNOSIS — Z87898 Personal history of other specified conditions: Secondary | ICD-10-CM | POA: Diagnosis not present

## 2022-08-08 DIAGNOSIS — G959 Disease of spinal cord, unspecified: Secondary | ICD-10-CM | POA: Diagnosis not present

## 2022-08-08 DIAGNOSIS — F1990 Other psychoactive substance use, unspecified, uncomplicated: Secondary | ICD-10-CM | POA: Diagnosis not present

## 2022-08-08 LAB — VANCOMYCIN, TROUGH: Vancomycin Tr: 12 ug/mL — ABNORMAL LOW (ref 15–20)

## 2022-08-08 MED ORDER — OXYCODONE HCL 5 MG PO TABS
10.0000 mg | ORAL_TABLET | ORAL | Status: DC | PRN
  Administered 2022-08-09 – 2022-08-11 (×4): 10 mg via ORAL
  Filled 2022-08-08 (×6): qty 2

## 2022-08-08 MED ORDER — SORBITOL 70 % SOLN
30.0000 mL | Freq: Once | Status: AC
  Administered 2022-08-08: 30 mL via ORAL
  Filled 2022-08-08: qty 30

## 2022-08-08 MED ORDER — OXYCODONE HCL 5 MG PO TABS
15.0000 mg | ORAL_TABLET | ORAL | Status: DC | PRN
  Administered 2022-08-08 – 2022-08-14 (×3): 15 mg via ORAL
  Filled 2022-08-08 (×2): qty 3

## 2022-08-08 NOTE — Progress Notes (Signed)
PROGRESS NOTE   Subjective/Complaints:  Pt  reports not sleepy from pain meds Pain is still her biggest issue- esp when moving with therapy.   LBM 3-4 days ago- denies constipation, but admits that doesn't "wants to go because took 2 days to get cleaned out last time".   Per son, had abscess on side of face/head in last year- thought could be cause of epidural abscess.    ROS:   Pt denies SOB, abd pain, CP, N/V/ (+)C/D, and vision changes Except for HPI  Objective:   No results found. Recent Labs    08/06/22 0520  WBC 8.7  HGB 9.6*  HCT 28.1*  PLT 403*   Recent Labs    08/06/22 0520  NA 138  K 4.0  CL 100  CO2 27  GLUCOSE 113*  BUN 15  CREATININE 0.84  CALCIUM 9.4    Intake/Output Summary (Last 24 hours) at 08/08/2022 0818 Last data filed at 08/08/2022 0500 Gross per 24 hour  Intake 476 ml  Output 2250 ml  Net -1774 ml        Physical Exam: Vital Signs Blood pressure (!) 123/58, pulse 75, temperature 98.4 F (36.9 C), resp. rate 18, height '5\' 7"'$  (1.702 m), weight 55.7 kg, SpO2 98 %.     General: awake, alert, appropriate, sitting up in bed; wearing Cervical collar;  NAD HENT: conjugate gaze; oropharynx moist- wearing collar CV: regular rate and rhythm; no JVD Pulmonary: CTA B/L; no W/R/R- good air movement GI: soft, NT, ND, (+)BS Psychiatric: appropriate- more jovial today Neurological: Ox3  decreased to light touch C5 downwards PRIOR EXAM: Skin: Clean and intact without signs of breakdown. Wound cdi Neuro:  Alert and oriented x 3. Fair insight and awareness. Functional Memory. Normal language and speech. Cranial nerve exam unremarkable. UE motor 3 prox to 4/5 distally with decreased LT at finger tips bilaterally. LE 3+ prox to 4/5 distally with decreased LT below ankle both feet.  Musculoskeletal: neck tender even in collar with basic head movement    Assessment/Plan: 1. Functional  deficits which require 3+ hours per day of interdisciplinary therapy in a comprehensive inpatient rehab setting. Physiatrist is providing close team supervision and 24 hour management of active medical problems listed below. Physiatrist and rehab team continue to assess barriers to discharge/monitor patient progress toward functional and medical goals  Care Tool:  Bathing        Body parts bathed by helper: Right arm, Left arm, Chest, Abdomen, Front perineal area, Buttocks, Right upper leg, Left upper leg, Right lower leg, Left lower leg, Face     Bathing assist Assist Level: Total Assistance - Patient < 25%     Upper Body Dressing/Undressing Upper body dressing   What is the patient wearing?: Hospital gown only    Upper body assist Assist Level: Total Assistance - Patient < 25%    Lower Body Dressing/Undressing Lower body dressing      What is the patient wearing?: Underwear/pull up, Pants     Lower body assist Assist for lower body dressing: Total Assistance - Patient < 25%     Toileting Toileting    Toileting assist Assist for toileting:  Total Assistance - Patient < 25%     Transfers Chair/bed transfer  Transfers assist     Chair/bed transfer assist level: Moderate Assistance - Patient 50 - 74%     Locomotion Ambulation   Ambulation assist   Ambulation activity did not occur: Safety/medical concerns          Walk 10 feet activity   Assist  Walk 10 feet activity did not occur: Safety/medical concerns        Walk 50 feet activity   Assist Walk 50 feet with 2 turns activity did not occur: Safety/medical concerns         Walk 150 feet activity   Assist Walk 150 feet activity did not occur: Safety/medical concerns         Walk 10 feet on uneven surface  activity   Assist Walk 10 feet on uneven surfaces activity did not occur: Safety/medical concerns         Wheelchair     Assist Is the patient using a wheelchair?: Yes  (Pt will use chair, but unable to get to chair during eval d/t pain) Type of Wheelchair: Manual Wheelchair activity did not occur: Safety/medical concerns         Wheelchair 50 feet with 2 turns activity    Assist    Wheelchair 50 feet with 2 turns activity did not occur: Safety/medical concerns       Wheelchair 150 feet activity     Assist  Wheelchair 150 feet activity did not occur: Safety/medical concerns       Blood pressure (!) 123/58, pulse 75, temperature 98.4 F (36.9 C), resp. rate 18, height '5\' 7"'$  (1.702 m), weight 55.7 kg, SpO2 98 %.  Medical Problem List and Plan: 1. Functional deficits secondary to severe cervical stenosis with myelopathy/epidural abscess.  Status post open posterior cervical lateral instrumentation posterior lateral fusion C3-4 4-5 C5-6 as well as laminotomies evacuation of epidural abscess C3-4 07/28/2022.  Cervical collar when out of bed.             -patient may not shower -ELOS/Goals: 13 to 17 days, modified independent and supervision with PT and OT           D/c 08/23/22  Con't CIR PT and OT 2.  Antithrombotics: -DVT/anticoagulation:  Pharmaceutical: changed heparin to sq lovenox '40mg'$  daily to decrease sticks             -antiplatelet therapy: N/A 3. Pain Management: Oxycodone 5/'10mg'$  q3h PRN, Robaxin '500mg'$  q6h as needed  -is taking prior to therapies with good results.   2/27- pain controlled- no complaints- con't regimen 4. Mood/Behavior/Sleep: Provide emotional support             -antipsychotic agents: N/A 5. Neuropsych/cognition: This patient is capable of making decisions on her own behalf. 6. Skin/Wound Care: Routine skin checks 7. Fluids/Electrolytes/Nutrition: encourage po. Weekly labs starting 08/06/22 -added protein supplement. Continue vitamins -consulted RD 2/22 -can have food from home -begin megace trial '400mg'$  BID 2/28- Will check CMP next Monday 8.  ID.  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  Currently on  vancomycin and ceftriaxone until 09/08/2022.    2/27- last trough 13- per pharmacy 9.  Hypertension.  Norvasc 5 mg daily.     -borderline 2/23--obsv today  2/28- BP controlled- con't regimen Vitals:   08/04/22 0426 08/04/22 1503 08/04/22 1938 08/05/22 0513  BP: (!) 147/64 (!) 127/57 136/68 130/63   08/05/22 1938 08/06/22 0442 08/06/22 1651 08/06/22 2100  BP:  125/66 (!) 124/56 (!) 120/54 120/62   08/07/22 0559 08/07/22 1358 08/07/22 1743 08/08/22 0546  BP: 128/61 117/62 129/72 (!) 123/58    10.  History of alcohol/tobacco/cannabis use.  Provide counseling, monitor for withdrawal 11.  Slow transit Constipation/neurogenic bowel: pt has flatus, intake minimal over last few days.  -encouraged po intake.    -can have food from home  -change colace to senna-s 2 tabs at bedtime             -continue daily miralax 17g - sorbitol 43m as needed -08/04/22 still no BM in several days, increase miralax to BID, restart colace '100mg'$  QD for now -08/05/22 BM x2 overnight, oversoftened, will back down miralax to QD, monitor -2/26 LBM night before last, continue to monitor, has been continent even when loose stool 2/27- says LBM 2 days ago- denies constipation  2/28- no BM in actually 4 days- will order Sorbitol 30cc after therapy today.   12.  Aortic atherosclerosis             -Consider resume aspirin and atorvastatin on discharge 13. ABLA   -2/26 stable at HGB 9.6 14. Thrombocytosis  -Likely reactive, no intervention at this time 15. Urinary incontinence  2/27- pt insistent to use Purewick- had one last night and said she doesn't want to get wet- since has difficulty holding her urine- will order Purewick for now- d/w nursing   I spent a total of  54  minutes on total care today- >50% coordination of care- due to  D/w nursing about bowels as well as pain regimen- also prolonged time with pt about pain regimen. Also called son and discussed medical issues- per son, had a abscess off side of  head/face removed in last year- could be source of infection.    LOS: 7 days A FACE TO FACE EVALUATION WAS PERFORMED  Kristen Bautista 08/08/2022, 8:18 AM

## 2022-08-08 NOTE — Progress Notes (Signed)
Occupational Therapy Session Note  Patient Details  Name: ASHLIEGH FACEY MRN: [id number] Date of Birth: 11-16-1955  Today's Date: 08/08/2022 OT Individual Time: 1100-1200 1st Session; 1300-1345 2nd Session  OT Individual Time Calculation (min): 60 min, 45 min    Short Term Goals: Week 1:  OT Short Term Goal 1 (Week 1): Pt will be able to sit EOB with min A in prep for UB dressing OT Short Term Goal 2 (Week 1): Pt will be able to come forward in prep for transfer with supervision OT Short Term Goal 3 (Week 1): Pt will transfer to toilet/BSC with mod A consistently OT Short Term Goal 4 (Week 1): Pt will thread LB clothing with min A  Skilled Therapeutic Interventions/Progress Updates:   1st Session:  Pt up in w/c with abx running via IV. Pt disapproving that the STEDY was left in her bathroom and OT checked safety plan and it is a SPT to toilet or BSC thus no STEDY needed in room. OT wiped down and removed to storage space on unit. Pt had infectious disease staff come in briefly while OT wrote out UE HEP and pt pleased with their report she may be able to be discharged with oral abx if medically warranted and pt pleased. Pt requested toileting and was able to perform sit to stand with CGA and cues for hand placement on RW, amb with CGA to and from bathroom to 3 in 1 placed over toilet and transfer with cues and CGA. OT managed IV pole and grip of R hand on RW handle. Will wrap with COBAN for next session. Pt able to complete peri hygiene with set up. Amb back to recliner for lunch with CGA. OT elevated LE's and placed pillows for comfort. Pain 4/10 overall reported in neck. OT training with simple breathing, foam cube grasp cube and beige (light) theraputty for grip, rolling, pinching and pressing. Written HEP left in room for carryover between visits and for d/c home. Left pt with chair alarm active, needs and nurse call button in reach.    2nd Session:  Pt continuing to self feed luch meal  with red tubing on fork for build up grip upon OT arrival. Cousin present bedside. Pt reports it takes her longer for meals post-op. OT added COBAN wrapping on RW handles for increased grip. Pt reported comfort in recliner. OT assisted transport of IV pole transferring out/in to recliner with CGA for access to bathroom via RW. Pt able to void and perform peri hygiene with close S. OT training with reacher use for LB clothing mngt and items out of reach. Cousin reported he would purchase. OT issued and trained in use of LH sponge for next sponge bathing ink side and placed in reach for next visit. Hand off to PT for next session.     Therapy Documentation Precautions:  Precautions Precautions: Cervical Precaution Comments: ok for out of brace in bed, for showering, and for short trips to bathroom Required Braces or Orthoses: Cervical Brace Cervical Brace: Hard collar, Other (comment) Restrictions Weight Bearing Restrictions: No    Therapy/Group: Individual Therapy  Barnabas Lister 08/08/2022, 7:55 AM

## 2022-08-08 NOTE — Progress Notes (Signed)
Patient ID: Kristen Bautista, female   DOB: July 14, 1955, 67 y.o.   MRN: [id number]  SW spoke with Erika/Central intake with [facility] (p:[phone]) reporting that all home health referrals go through [facility] services [phone]). SW spoke with representative at [facility] reporting that an order for clinicals for any service must be faxed in first, and then they will assist with finding the necessary service needed. This process is specific to certain agencies, and it is not required for all agencies.   SW spoke with Karen/Central Intake with [facility] and [facility]  [phone]) and informed they are not servicing [place] at this time.   SW faxed HHPT/OT/SN order to [facility].   Loralee Pacas, MSW, [place] Office: [phone] Cell: [phone] Fax: [phone]

## 2022-08-08 NOTE — Progress Notes (Signed)
Pharmacy Antibiotic Note  Kristen Bautista is a 67 y.o. female admitted on 08/01/2022 with cervical epidural abscess.  Pharmacy has been consulted for vancomycin dosing.  Patient is also on Rocephin.  Last Scr (2/26) 0.84 mg/dL  Plan: Continue vanc '500mg'$  IV Q12H Continue Rocephin 2gm IV Q24H per MD Monitor renal fxn, clinical progress, weekly vanc levels Per ID, plan for 6 weeks of abx from OR on 07/28/22 (End date: 09/08/22)  Height: '5\' 7"'$  (170.2 cm) Weight: 55.7 kg (122 lb 12.7 oz) IBW/kg (Calculated) : 61.6  Temp (24hrs), Avg:98.1 F (36.7 C), Min:97.8 F (36.6 C), Max:98.4 F (36.9 C)  Recent Labs  Lab 08/01/22 2108 08/02/22 0448 08/06/22 0520 08/08/22 0953  WBC 8.9 8.5 8.7  --   CREATININE 0.71 0.71 0.84  --   VANCOTROUGH  --   --   --  12*     Estimated Creatinine Clearance: 57.9 mL/min (by C-G formula based on SCr of 0.84 mg/dL).    No Known Allergies  Antimicrobials: Vanc 2/16 >> 09/08/22 Zosyn 2/16 >>2/18 CTX 2/18 >> 09/08/22  Monitoring: 2/18 VP/VT 33/21 on '750mg'$  q12 >> decr '500mg'$  q12 2/21 VT 13 (estimated AUC 480) >> no change 2/28 VT 12 drawn ~ 12 hours >> no changes at this time  Microbiology Results: 2/16 MRSA PCR - negative 2/16 BCx - negative 2/17 cervical tissue - ngtd  Vaughan Basta BS, PharmD, BCPS Clinical Pharmacist 08/08/2022 12:24 PM  Contact: [phone] after 3 PM  "Be curious, not judgmental..." -Jamal Maes

## 2022-08-08 NOTE — Progress Notes (Signed)
[place] for Infectious Disease  Date of Admission:  08/01/2022      Total days of antibiotics 11  Vancomycin 2/17   Ceftriaxone 2/17          ASSESSMENT: Kristen Bautista is a 67 y.o. female admitted with cervical stenosis, meyelopathy d/t epidural abscess s/p OR 07/28/22 for C3-6 posterior cervical decompression and instrument fusion. Operative cultures finalized @ no growth. She has been undergoing treatment with vancomycin + ceftriaxone for this infection and recovering well. She is now 11d post op.   Cost Concerns - Notified by LCSW team yesterday re: concerns about financial costs a/w home IV antibiotics and challenges with arranging this. Awaiting to hear about possible home assistance but she prefers pills after discharge from rehab (3/14 projected D/C date). We have investigated several oral options and Doxycycline BID + Levaquin QD are most reasonable option for her infection. Will plan to discharge with 60mof both on 3/14. She will continue on current IV antibiotics during her hospital stay. This will in total provide 7 weeks of treatment. Baseline CRP 11/4 ESR 104 --> will repeat for therapeutic monitoring at follow up in clinic on 3/20.    Therapeutic monitoring - doing well on vancomycin + ceftriaxone. Creatine stable. PICC in place and clean. No s/e to current therapy.    PLAN: Continue IV vancomycin + ceftriaxone while in rehab Home with Doxycycline 100 mg BID with food AND levaquin 750 mg QD x 30d supply.  Clinic FU on March 20 as scheduled with Dr. WJuleen China    Principal Problem:   Cervical myelopathy ([facility] Active Problems:   Moderate malnutrition (HCC)    amLODipine  5 mg Oral Daily   Chlorhexidine Gluconate Cloth  6 each Topical Q12H   docusate sodium  100 mg Oral Daily   enoxaparin (LOVENOX) injection  40 mg Subcutaneous Q24H   feeding supplement  237 mL Oral BID BM   megestrol  400 mg Oral BID   multivitamin with minerals  1 tablet Oral  Daily   polyethylene glycol  17 g Oral Daily   Ensure Max Protein  11 oz Oral Daily   senna-docusate  2 tablet Oral QHS   sorbitol  30 mL Oral Once    SUBJECTIVE: Feeling better and better and making good progress. Having some trouble with cervical collar sometimes where night staff don't make it tight enough but overall feeling well and tolerating IV abx well.      Review of Systems: Review of Systems  Constitutional:  Negative for chills and fever.  Gastrointestinal: Negative.   Genitourinary: Negative.   Musculoskeletal:  Neck pain: expected but improving post op pain/discomfort.     No Known Allergies  OBJECTIVE: Vitals:   08/07/22 0559 08/07/22 1358 08/07/22 1743 08/08/22 0546  BP: 128/61 117/62 129/72 (!) 123/58  Pulse: 87 97 100 75  Resp: '18 17 17 18  '$ Temp: 98 F (36.7 C) 97.8 F (36.6 C) 98.2 F (36.8 C) 98.4 F (36.9 C)  TempSrc: Oral Oral    SpO2: 100% 97% 98% 98%  Weight:      Height:       Body mass index is 19.23 kg/m.  Physical Exam Constitutional:      Appearance: Normal appearance. She is not ill-appearing.  Neck:     Comments: C spine collar in place.  Cardiovascular:     Rate and Rhythm: Normal rate and regular rhythm.  Pulmonary:  Effort: Pulmonary effort is normal.     Breath sounds: Normal breath sounds.  Skin:    General: Skin is warm and dry.     Comments: RUE PICC clean and dry   Neurological:     Mental Status: She is alert and oriented to person, place, and time.     Lab Results Lab Results  Component Value Date   WBC 8.7 08/06/2022   HGB 9.6 (L) 08/06/2022   HCT 28.1 (L) 08/06/2022   MCV 85.9 08/06/2022   PLT 403 (H) 08/06/2022    Lab Results  Component Value Date   CREATININE 0.84 08/06/2022   BUN 15 08/06/2022   NA 138 08/06/2022   K 4.0 08/06/2022   CL 100 08/06/2022   CO2 27 08/06/2022    Lab Results  Component Value Date   ALT 28 08/02/2022   AST 41 08/02/2022   ALKPHOS 68 08/02/2022   BILITOT 0.8  08/02/2022     Microbiology: No results found for this or any previous visit (from the past 240 hour(s)).  Janene Madeira, MSN, NP-C [facility] for Infectious [place] Pager: [phone]  08/08/2022  12:32 PM

## 2022-08-08 NOTE — TOC Benefit Eligibility Note (Addendum)
Patient Teacher, English as a foreign language completed.    The patient is currently admitted and upon discharge could be taking Nuzyra 150 mg tablets.  The current 30 day co-pay is $3,317.83.   The patient is currently admitted and upon discharge could be taking doxycylcine 100 mg capsules.  The current 30 day co-pay is $0.00.   The patient is currently admitted and upon discharge could be taking levofloxacin 750 mg tablets.  The current 30 day co-pay is $0.00.   The patient is currently admitted and upon discharge could be taking linezolid (Zyvox) 600 mg tablets.  The current 30 day co-pay is $68.36.   The patient is insured through [place], [place] Patient Advocate Specialist [place] Patient Advocate Team Direct Number: [phone]  Fax: [phone]

## 2022-08-08 NOTE — Progress Notes (Signed)
Physical Therapy Session Note  Patient Details  Name: Kristen Bautista MRN: [id number] Date of Birth: 04-12-1956  Today's Date: 08/08/2022 PT Individual Time: [id number], [id number]  PT Individual Time Calculation (min): 56 min , 30 min   Short Term Goals: Week 1:  PT Short Term Goal 1 (Week 1): pt will sit EOB with CGA or better PT Short Term Goal 2 (Week 1): Pt will perform STS with mod A consistently PT Short Term Goal 3 (Week 1): Pt will tolerate sitting OOB between sessions for improved activity tolerance  Skilled Therapeutic Interventions/Progress Updates:      Therapy Documentation Precautions:  Precautions Precautions: Cervical Precaution Comments: ok for out of brace in bed, for showering, and for short trips to bathroom Required Braces or Orthoses: Cervical Brace Cervical Brace: Hard collar, Other (comment) Restrictions Weight Bearing Restrictions: No  Treatment Session 1:   Pt received semi-reclined in bed, agreeable to PT session. Pt incontinent of bladder on PT arrival and with pure wick in place. Pt requires min A with rolling and total A for peri-care and upper/lower body dressing for time management and energy conservation. Pt with unrated neck pain, provided repositioning and rest breaks for relief. Pt supervision for safety with supine to sit with use of bed features (HOB elevated). Pt requires HHA for stand pivot with no AD to w/c and transported total A for time management to main gym. PT donned bilateral LE 4# ankle weights and pt performed seated marches 1 x 10 and LAQ 1 x 10 as warm-up. Pt transitioned to gait training and ambulated with ankle weights ~200 ft with min R HHA. Pt requires verbal and tactile cues to encouraged shoulder depression and retraction with gait. Pt ambulated ~additional 200 ft without ankle weights and demonstrates positive carry over and continued to require min R HHA. Pt left seated in w/c at bedside with all needs in reach and chair alarm  on.    Treatment Session 2:   Pt received in care of OT, PT present for handoff. Pt without reports of pain in session and transported total A for time management in w/c to main gym. Pt requested to work on fine motor UE tasks. Pt requires CGA/min A for dynamic standing balance while wearing 1.5# ankle weights to bilateral wrist and engaging in contralateral and ipsilateral reaching of checker pieces. Pt able to tolerate standing ~10 minutes and requires verbal cues for anterior pelvic tilt as pt with tendency to engage in truncal extension and posterior pelvic tilt. Pt transported to room and left seated in recliner with all needs in reach and alarm on.   Therapy/Group: Individual Therapy  Verl Dicker Verl Dicker PT, DPT  08/08/2022, 7:48 AM

## 2022-08-09 ENCOUNTER — Inpatient Hospital Stay (HOSPITAL_COMMUNITY): Payer: No Typology Code available for payment source

## 2022-08-09 DIAGNOSIS — G959 Disease of spinal cord, unspecified: Secondary | ICD-10-CM | POA: Diagnosis not present

## 2022-08-09 DIAGNOSIS — Z87898 Personal history of other specified conditions: Secondary | ICD-10-CM

## 2022-08-09 DIAGNOSIS — K5900 Constipation, unspecified: Secondary | ICD-10-CM | POA: Diagnosis not present
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
## 2022-08-09 NOTE — Progress Notes (Signed)
Physical Therapy Session Note  Patient Details  Name: Kristen Bautista MRN: [id number] Date of Birth: 1956-05-17  Today's Date: 08/09/2022 PT Individual Time: [id number] PT Individual Time Calculation (min): 74 min   Short Term Goals: Week 1:  PT Short Term Goal 1 (Week 1): pt will sit EOB with CGA or better PT Short Term Goal 2 (Week 1): Pt will perform STS with mod A consistently PT Short Term Goal 3 (Week 1): Pt will tolerate sitting OOB between sessions for improved activity tolerance  Skilled Therapeutic Interventions/Progress Updates:    pt received in bed and agreeable to therapy. Pt reports pain controlled on medication this session. Bed mobility with supervision and increased time with bed features. Pt requested to use bathroom. ambulatory transfer with min-mod A with RW and therapist managing IV pole.  Clothing management with min A and hygiene with min A for balance in standing. Hand hygiene in standing with min A. Pt then ambulated to gym with RW and min-mod A for balance and ataxic gait pattern. X1 during session, pt's R hand slipped off RW. Educated on stopping if she needs to adjust hand d/t poor sensation. Session focused on gait and Sit to stand training with 4lb ankle weights for improved proprioception and decr ataxia. Pt performed Sit to stand x 5 and gait x 90 ft, x 4 bouts with short rest breaks. Cued for decr UE support and decr LE support against table. Pt performed with CGA overall throughout. Gait with ankle weights pt demoes improved ataxia, cues for increased cadence for improved stability and gait mechanics. Noted carryover while ambulating back to room in same manner. Pt returned to room and to bed after session and was left with all needs in reach and alarm active.   Therapy Documentation Precautions:  Precautions Precautions: Cervical Precaution Comments: ok for out of brace in bed, for showering, and for short trips to bathroom Required Braces or Orthoses: Cervical  Brace Cervical Brace: Hard collar, Other (comment) Restrictions Weight Bearing Restrictions: No General:       Therapy/Group: Individual Therapy  Mickel Fuchs 08/09/2022, 8:17 AM

## 2022-08-09 NOTE — Progress Notes (Addendum)
Patient ID: Kristen Bautista, female   DOB: [birth year], 67 y.o.   MRN: [id number]  SW spoke with Kristen Bautista/rep with [facility] [phone] ext [phone]) reporting referral received, and will need IV abx order as well. SW will work on getting this. States she will send referral to [facility].   Per EMR, ID to switch to at time of discharge.   SW spoke with Kristen Bautista/[facility](p: [phone]) to inform on above. SW left message for Kristen Bautista/[facility] to inform on above as well. SW asked if she can follow-up about finding HHPT/OT only.   SW spoke with [name] to inform on above.  He confirms spoke with attending. He is happy to hear the new changes. SW will continue to provide updates.   HHPT/OT referral accepted by [facility].   *SW returned phone call and left message informing Kristen Bautista/[facility] on above.   Loralee Pacas, MSW, [place] Office: [phone] Cell: [phone] Fax: [phone]

## 2022-08-09 NOTE — Plan of Care (Signed)
  Problem: Consults Goal: RH SPINAL CORD INJURY PATIENT EDUCATION Description:  See Patient Education module for education specifics.  Outcome: Progressing   Problem: SCI BOWEL ELIMINATION Goal: RH STG MANAGE BOWEL WITH ASSISTANCE Description: STG Manage Bowel with min Assistance. Outcome: Progressing Goal: RH STG SCI MANAGE BOWEL WITH MEDICATION WITH ASSISTANCE Description: STG SCI Manage bowel with medication with min assistance. Outcome: Progressing Goal: RH STG SCI MANAGE BOWEL PROGRAM W/ASSIST OR AS APPROPRIATE Description: STG SCI Manage bowel program w/mod assist or as appropriate. Outcome: Progressing   Problem: SCI BLADDER ELIMINATION Goal: RH STG MANAGE BLADDER WITH ASSISTANCE Description: STG Manage Bladder With min Assistance Outcome: Progressing Goal: RH STG SCI MANAGE BLADDER PROGRAM W/ASSISTANCE Description: Manage bladder program with min assist Outcome: Progressing   Problem: RH SKIN INTEGRITY Goal: RH STG SKIN FREE OF INFECTION/BREAKDOWN Description: Monitor skin for changes, infection, and breakdown with min assist Outcome: Progressing Goal: RH STG MAINTAIN SKIN INTEGRITY WITH ASSISTANCE Description: STG Maintain Skin Integrity With min Assistance. Outcome: Progressing Goal: RH STG ABLE TO PERFORM INCISION/WOUND CARE W/ASSISTANCE Description: STG Able To Perform Incision/Wound Care With min Assistance. Outcome: Progressing   Problem: RH SAFETY Goal: RH STG ADHERE TO SAFETY PRECAUTIONS W/ASSISTANCE/DEVICE Description: STG Adhere to Safety Precautions With cueing Assistance/Device. Outcome: Progressing   Problem: RH PAIN MANAGEMENT Goal: RH STG PAIN MANAGED AT OR BELOW PT'S PAIN GOAL Description: Pain will be managed 4 out of 10 on pain scale with PRN medications min assist Outcome: Progressing   Problem: RH KNOWLEDGE DEFICIT SCI Goal: RH STG INCREASE KNOWLEDGE OF SELF CARE AFTER SCI Description: Patient/caregiver will be able to managed medications and  self care related to SCI from nursing education and nursing handouts independently Outcome: Progressing   Problem: Education: Goal: Ability to verbalize activity precautions or restrictions will improve Outcome: Progressing Goal: Knowledge of the prescribed therapeutic regimen will improve Outcome: Progressing Goal: Understanding of discharge needs will improve Outcome: Progressing   Problem: Activity: Goal: Ability to avoid complications of mobility impairment will improve Outcome: Progressing Goal: Ability to tolerate increased activity will improve Outcome: Progressing Goal: Will remain free from falls Outcome: Progressing   Problem: Bowel/Gastric: Goal: Gastrointestinal status for postoperative course will improve Outcome: Progressing   Problem: Clinical Measurements: Goal: Ability to maintain clinical measurements within normal limits will improve Outcome: Progressing Goal: Postoperative complications will be avoided or minimized Outcome: Progressing Goal: Diagnostic test results will improve Outcome: Progressing   Problem: Pain Management: Goal: Pain level will decrease Outcome: Progressing   Problem: Skin Integrity: Goal: Will show signs of wound healing Outcome: Progressing   Problem: Health Behavior/Discharge Planning: Goal: Identification of resources available to assist in meeting health care needs will improve Outcome: Progressing   Problem: Bladder/Genitourinary: Goal: Urinary functional status for postoperative course will improve Outcome: Progressing

## 2022-08-09 NOTE — Progress Notes (Signed)
PROGRESS NOTE   Subjective/Complaints:  Pt reports pain doing much better yesterday with therapy with increase in pain meds.  Says has urinary urgency- and needs to go to bathroom asap.   Got up early last night, so up in bedside chair.    ROS:   Pt denies SOB, abd pain, CP, N/V/C/D, and vision changes  Except for HPI  Objective:   No results found. No results for input(s): "WBC", "HGB", "HCT", "PLT" in the last 72 hours.  No results for input(s): "NA", "K", "CL", "CO2", "GLUCOSE", "BUN", "CREATININE", "CALCIUM" in the last 72 hours.   Intake/Output Summary (Last 24 hours) at 08/09/2022 0808 Last data filed at 08/09/2022 0230 Gross per 24 hour  Intake 480 ml  Output 1100 ml  Net -620 ml        Physical Exam: Vital Signs Blood pressure 125/73, pulse 73, temperature 98.2 F (36.8 C), temperature source Oral, resp. rate 17, height '5\' 7"'$  (1.702 m), weight 55.7 kg, SpO2 98 %.     General: awake, alert, appropriate, supine in bedside chair; covers over head; asks who I am daily; NAD HENT: conjugate gaze; oropharynx moist CV: regular rhythm and rate; no JVD Pulmonary: CTA B/L; no W/R/R- good air movement GI: soft, NT, somewhat distended, (+)BS- hypoactive Psychiatric: appropriate- more interactive Neurological: Ox3  decreased to light touch C5 downwards PRIOR EXAM: Skin: Clean and intact without signs of breakdown. Wound cdi Neuro:  Alert and oriented x 3. Fair insight and awareness. Functional Memory. Normal language and speech. Cranial nerve exam unremarkable. UE motor 3 prox to 4/5 distally with decreased LT at finger tips bilaterally. LE 3+ prox to 4/5 distally with decreased LT below ankle both feet.  Musculoskeletal: neck tender even in collar with basic head movement    Assessment/Plan: 1. Functional deficits which require 3+ hours per day of interdisciplinary therapy in a comprehensive inpatient rehab  setting. Physiatrist is providing close team supervision and 24 hour management of active medical problems listed below. Physiatrist and rehab team continue to assess barriers to discharge/monitor patient progress toward functional and medical goals  Care Tool:  Bathing        Body parts bathed by helper: Right arm, Left arm, Chest, Abdomen, Front perineal area, Buttocks, Right upper leg, Left upper leg, Right lower leg, Left lower leg, Face     Bathing assist Assist Level: Total Assistance - Patient < 25%     Upper Body Dressing/Undressing Upper body dressing   What is the patient wearing?: Hospital gown only    Upper body assist Assist Level: Total Assistance - Patient < 25%    Lower Body Dressing/Undressing Lower body dressing      What is the patient wearing?: Underwear/pull up, Pants     Lower body assist Assist for lower body dressing: Total Assistance - Patient < 25%     Toileting Toileting    Toileting assist Assist for toileting: Moderate Assistance - Patient 50 - 74%     Transfers Chair/bed transfer  Transfers assist     Chair/bed transfer assist level: Moderate Assistance - Patient 50 - 74%     Locomotion Ambulation   Ambulation assist  Ambulation activity did not occur: Safety/medical concerns          Walk 10 feet activity   Assist  Walk 10 feet activity did not occur: Safety/medical concerns        Walk 50 feet activity   Assist Walk 50 feet with 2 turns activity did not occur: Safety/medical concerns         Walk 150 feet activity   Assist Walk 150 feet activity did not occur: Safety/medical concerns         Walk 10 feet on uneven surface  activity   Assist Walk 10 feet on uneven surfaces activity did not occur: Safety/medical concerns         Wheelchair     Assist Is the patient using a wheelchair?: Yes (Pt will use chair, but unable to get to chair during eval d/t pain) Type of Wheelchair:  Manual Wheelchair activity did not occur: Safety/medical concerns         Wheelchair 50 feet with 2 turns activity    Assist    Wheelchair 50 feet with 2 turns activity did not occur: Safety/medical concerns       Wheelchair 150 feet activity     Assist  Wheelchair 150 feet activity did not occur: Safety/medical concerns       Blood pressure 125/73, pulse 73, temperature 98.2 F (36.8 C), temperature source Oral, resp. rate 17, height '5\' 7"'$  (1.702 m), weight 55.7 kg, SpO2 98 %.  Medical Problem List and Plan: 1. Functional deficits secondary to severe cervical stenosis with myelopathy/epidural abscess.  Status post open posterior cervical lateral instrumentation posterior lateral fusion C3-4 4-5 C5-6 as well as laminotomies evacuation of epidural abscess C3-4 07/28/2022.  Cervical collar when out of bed.             -patient may not shower- due to PICC -ELOS/Goals: 13 to 17 days, modified independent and supervision with PT and OT           D/c 08/23/22  Con't CIR PT and OT 2.  Antithrombotics: -DVT/anticoagulation:  Pharmaceutical: changed heparin to sq lovenox '40mg'$  daily to decrease sticks             -antiplatelet therapy: N/A 3. Pain Management: Oxycodone 5/'10mg'$  q3h PRN, Robaxin '500mg'$  q6h as needed  -is taking prior to therapies with good results.   2/29- increased pain meds yesterday to 10-15 mg q 3 hours prn form 5-10 mg q3 hours prn- has substantially helped per pt.  4. Mood/Behavior/Sleep: Provide emotional support             -antipsychotic agents: N/A 5. Neuropsych/cognition: This patient is capable of making decisions on her own behalf. 6. Skin/Wound Care: Routine skin checks 7. Fluids/Electrolytes/Nutrition: encourage po. Weekly labs starting 08/06/22 -added protein supplement. Continue vitamins -consulted RD 2/22 -can have food from home -begin megace trial '400mg'$  BID 2/28- Will check CMP next Monday 8.  ID.  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   Currently on vancomycin and ceftriaxone until 09/08/2022.    2/27- last trough 13- per pharmacy 9.  Hypertension.  Norvasc 5 mg daily.     -borderline 2/23--obsv today  2/28- BP controlled- con't regimen Vitals:   08/05/22 0513 08/05/22 1938 08/06/22 0442 08/06/22 1651  BP: 130/63 125/66 (!) 124/56 (!) 120/54   08/06/22 2100 08/07/22 0559 08/07/22 1358 08/07/22 1743  BP: 120/62 128/61 117/62 129/72   08/08/22 0546 08/08/22 1537 08/08/22 1939 08/09/22 0426  BP: (!) 123/58 123/67 126/70 125/73  10.  History of alcohol/tobacco/cannabis use.  Provide counseling, monitor for withdrawal 11.  Slow transit Constipation/neurogenic bowel: pt has flatus, intake minimal over last few days.  -encouraged po intake.    -can have food from home  -change colace to senna-s 2 tabs at bedtime             -continue daily miralax 17g - sorbitol 74m as needed -08/04/22 still no BM in several days, increase miralax to BID, restart colace '100mg'$  QD for now -08/05/22 BM x2 overnight, oversoftened, will back down miralax to QD, monitor -2/26 LBM night before last, continue to monitor, has been continent even when loose stool 2/27- says LBM 2 days ago- denies constipation  2/28- no BM in actually 4 days- will order Sorbitol 30cc after therapy today.  2/29- no BM documented yet- will give soap suds enema after therapy today since no results with sorbitol-Sorbitol given 6:30pm yesterday- Will also check KUB to make sure on ileus or obstruction 12.  Aortic atherosclerosis             -Consider resume aspirin and atorvastatin on discharge 13. ABLA   -2/26 stable at HGB 9.6 14. Thrombocytosis  -Likely reactive, no intervention at this time 15. Urinary incontinence  2/27- pt insistent to use Purewick- had one last night and said she doesn't want to get wet- since has difficulty holding her urine- will order Purewick for now- d/w nursing  2/29- when pt needs to go, has urgency- let nursing know this AM   I spent a  total of 36   minutes on total care today- >50% coordination of care- due to  D/w PA about KUB; also d/w nursing about soap suds enema  LOS: 8 days A FACE TO FACE EVALUATION WAS PERFORMED  [place] 08/09/2022, 8:08 AM

## 2022-08-09 NOTE — Progress Notes (Signed)
Occupational Therapy Session Note  Patient Details  Name: SINDEL LEY MRN: [id number] Date of Birth: Mar 20, 1956  Today's Date: 08/09/2022 OT Individual Time: [id number] Individual Time Calculation (min): 75 min    Short Term Goals: Week 1:  OT Short Term Goal 1 (Week 1): Pt will be able to sit EOB with min A in prep for UB dressing OT Short Term Goal 2 (Week 1): Pt will be able to come forward in prep for transfer with supervision OT Short Term Goal 3 (Week 1): Pt will transfer to toilet/BSC with mod A consistently OT Short Term Goal 4 (Week 1): Pt will thread LB clothing with min A  Skilled Therapeutic Interventions/Progress Updates:    OT intervention with focus on self feeding and discharge planning. Pt states her cousin will be staying with her at discharge. Pt using foam tubing on utensils to facilitate improved grasp for self feeding. Pt requires rest breaks during task. Pt issued HEP for theraputty to facilitate improved FMC/GMC and strengthening. Pt remained in recliner with belt alarm activated. All needs within reach.   Therapy Documentation Precautions:  Precautions Precautions: Cervical Precaution Comments: ok for out of brace in bed, for showering, and for short trips to bathroom Required Braces or Orthoses: Cervical Brace Cervical Brace: Hard collar, Other (comment) Restrictions Weight Bearing Restrictions: No   Pain:  Pt c/o 3/10 pain in neck; meds admin by RN at beginning of session   Therapy/Group: Individual Therapy  [name] 08/09/2022, 9:18 AM

## 2022-08-09 NOTE — Progress Notes (Signed)
Nutrition Follow-up  DOCUMENTATION CODES:   Non-severe (moderate) malnutrition in context of chronic illness  INTERVENTION:  - Continue Ensure Max po q day, each supplement provides 150 kcal and 30 grams of protein.    - Continue Ensure Enlive po BID, each supplement provides 350 kcal and 20 grams of protein.  NUTRITION DIAGNOSIS:   Moderate Malnutrition related to chronic illness as evidenced by energy intake < or equal to 75% for > or equal to 1 month, mild muscle depletion, mild fat depletion. - Improving  GOAL:   Patient will meet greater than or equal to 90% of their needs - Improving  MONITOR:   PO intake, Supplement acceptance  REASON FOR ASSESSMENT:   Consult Poor PO  ASSESSMENT:   67 y.o. female admits to CIR related to functional deficits secondary to severe cervical stenosis with myelopathy/epidural abscess. PMH includes: alcohol abuse, anemia, aortic atherosclerosis, cannabis abuse, centrilobular emphysema, cervical myelopathy, Hep-C, HLD, HTN.  Meds reviewed: colace, megace, MVI, miralax, senokot. Labs reviewed.   Pt with improving intakes. Per record, pt has eaten mostly 50-100% of her meals over the past 7 days. RD will continue to monitor PO intakes.   Diet Order:   Diet Order             Diet regular Room service appropriate? Yes; Fluid consistency: Thin  Diet effective now                   EDUCATION NEEDS:   Not appropriate for education at this time  Skin:  Skin Assessment: Skin Integrity Issues: Skin Integrity Issues:: Incisions Incisions: Neck  Last BM:  2/25 - pt with bowel regimen ordered.   Height:   Ht Readings from Last 1 Encounters:  08/01/22 '5\' 7"'$  (1.702 m)    Weight:   Wt Readings from Last 1 Encounters:  08/01/22 55.7 kg    Ideal Body Weight:     BMI:  Body mass index is 19.23 kg/m.  Estimated Nutritional Needs:   Kcal:  1670-1950 kcals  Protein:  80-100 gm  Fluid:  >/= 1.6 L  Thalia Bloodgood, RD, LDN,  CNSC.

## 2022-08-09 NOTE — Progress Notes (Signed)
Occupational Therapy Session Note  Patient Details  Name: Kristen Bautista MRN: [id number] Date of Birth: 1955-12-08  Today's Date: 08/09/2022 OT Individual Time: 1330-1400 OT Individual Time Calculation (min): 30 min    Short Term Goals: Week 1:  OT Short Term Goal 1 (Week 1): Pt will be able to sit EOB with min A in prep for UB dressing OT Short Term Goal 2 (Week 1): Pt will be able to come forward in prep for transfer with supervision OT Short Term Goal 3 (Week 1): Pt will transfer to toilet/BSC with mod A consistently OT Short Term Goal 4 (Week 1): Pt will thread LB clothing with min A  Skilled Therapeutic Interventions/Progress Updates:    Pt resting in bed upon arrival. Pt still eating lunch. OT intervention with focus on BUE strengthening and [place]. Pt issued yellow theraputty and instructed on use. Pt returned demonstrated tasks/activities. Pt remained in bed. All needs within reach.   Therapy Documentation Precautions:  Precautions Precautions: Cervical Precaution Comments: ok for out of brace in bed, for showering, and for short trips to bathroom Required Braces or Orthoses: Cervical Brace Cervical Brace: Hard collar, Other (comment) Restrictions Weight Bearing Restrictions: No   Pain:  PT denies pain this afternoon  Therapy/Group: Individual Therapy  [name] 08/09/2022, 2:01 PM

## 2022-08-10 DIAGNOSIS — F1990 Other psychoactive substance use, unspecified, uncomplicated: Secondary | ICD-10-CM

## 2022-08-10 NOTE — Progress Notes (Signed)
Occupational Therapy Session Note  Patient Details  Name: Kristen Bautista MRN: [id number] Date of Birth: 15-Nov-1955  Today's Date: 08/10/2022 OT Individual Time: [id number] OT Individual Time Calculation (min): 72 min    Short Term Goals: Week 2:  OT Short Term Goal 1 (Week 2): Pt will thread LB clothing with min A OT Short Term Goal 2 (Week 2): Pt will perform toilet transfers with min A OT Short Term Goal 3 (Week 2): Pt will perfomr toileting tasks with mod A OT Short Term Goal 4 (Week 2): Pt will complete UB dressing tasks with mod A  Skilled Therapeutic Interventions/Progress Updates:    Pt seated EOB upon arrival. Pt declined bathing and donning clothing. Pt agreeable to changing hospital gowns. Dependent for doffing/donning hospital gowns. Pt expressed interest in BUE therex/improved function. Pt amb with RW to recliner with supervision. Pt remarks, "don't touch me" during transfer. Pt sat in recliner to review BUE exercises and tasks. Pt issued yellow theraputty with 10 beads embedded. Pt used BUE/hands to remove small beads. Pt dropped one bead on floor but otherwise she was able to pick up all beads and remove from putty. Pt remained in recliner. Belt alarm activated. All needs within reach.   Therapy Documentation Precautions:  Precautions Precautions: Cervical Precaution Comments: ok for out of brace in bed, for showering, and for short trips to bathroom Required Braces or Orthoses: Cervical Brace Cervical Brace: Hard collar, Other (comment) Restrictions Weight Bearing Restrictions: No General:   Vital Signs:   Pain:  Pt denies pain but reports "stiffness" in LUE around PICC site; RN aware    Therapy/Group: Individual Therapy  [name] 08/10/2022, 9:21 AM
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
## 2022-08-10 NOTE — Progress Notes (Signed)
PROGRESS NOTE   Subjective/Complaints:  Pt reports reports I woke her up this AM- didn't want to speak with me initially when first saw.  Came back a second time- initially did say only sleeping 3-4 hours/night, but second time (when didn't recognize me again) said "sleeping fine" said pain controlled in bed- not clear, won't elucidate if pain still an issue with therapy.   ROS:   Pt denies SOB, abd pain, CP, N/V/C/D, and vision changes  Except for HPI  Objective:   DG Abd 1 View  Result Date: 08/09/2022 CLINICAL DATA:  Constipation over the last 5 days EXAM: ABDOMEN - 1 VIEW COMPARISON:  None Available. FINDINGS: Prominent stool throughout the colon favors constipation. This extends down to the rectum. No dilated small bowel. No significant abnormal calcifications or findings of organomegaly. Tubing compatible with a small caliber central line projects over the lower SVC. IMPRESSION: 1. Prominent stool throughout the colon favors constipation. Electronically Signed   By: Van Clines M.D.   On: 08/09/2022 10:07   No results for input(s): "WBC", "HGB", "HCT", "PLT" in the last 72 hours.  No results for input(s): "NA", "K", "CL", "CO2", "GLUCOSE", "BUN", "CREATININE", "CALCIUM" in the last 72 hours.   Intake/Output Summary (Last 24 hours) at 08/10/2022 0753 Last data filed at 08/09/2022 1826 Gross per 24 hour  Intake 715 ml  Output --  Net 715 ml        Physical Exam: Vital Signs Blood pressure 134/72, pulse 97, temperature 97.9 F (36.6 C), temperature source Oral, resp. rate 17, height '5\' 7"'$  (1.702 m), weight 55.7 kg, SpO2 100 %.      General: awake, alert, appropriate, sitting EOB; very frustrated; NAD HENT: conjugate gaze; oropharynx moist CV: regular rhythm; borderline tachycardic rate; no JVD Pulmonary: CTA B/L; no W/R/R- good air movement GI: soft, NT, ND, (+)BS- more normoactive Psychiatric:  inappropriate, frustrated, irritable- will not interact Neurological: Ox3   decreased to light touch C5 downwards PRIOR EXAM: Skin: Clean and intact without signs of breakdown. Wound cdi Neuro:  Alert and oriented x 3. Fair insight and awareness. Functional Memory. Normal language and speech. Cranial nerve exam unremarkable. UE motor 3 prox to 4/5 distally with decreased LT at finger tips bilaterally. LE 3+ prox to 4/5 distally with decreased LT below ankle both feet.  Musculoskeletal: neck tender even in collar with basic head movement    Assessment/Plan: 1. Functional deficits which require 3+ hours per day of interdisciplinary therapy in a comprehensive inpatient rehab setting. Physiatrist is providing close team supervision and 24 hour management of active medical problems listed below. Physiatrist and rehab team continue to assess barriers to discharge/monitor patient progress toward functional and medical goals  Care Tool:  Bathing        Body parts bathed by helper: Right arm, Left arm, Chest, Abdomen, Front perineal area, Buttocks, Right upper leg, Left upper leg, Right lower leg, Left lower leg, Face     Bathing assist Assist Level: Total Assistance - Patient < 25%     Upper Body Dressing/Undressing Upper body dressing   What is the patient wearing?: Hospital gown only    Upper body assist Assist  Level: Total Assistance - Patient < 25%    Lower Body Dressing/Undressing Lower body dressing      What is the patient wearing?: Underwear/pull up, Pants     Lower body assist Assist for lower body dressing: Total Assistance - Patient < 25%     Toileting Toileting    Toileting assist Assist for toileting: Moderate Assistance - Patient 50 - 74%     Transfers Chair/bed transfer  Transfers assist     Chair/bed transfer assist level: Moderate Assistance - Patient 50 - 74%     Locomotion Ambulation   Ambulation assist   Ambulation activity did not occur:  Safety/medical concerns          Walk 10 feet activity   Assist  Walk 10 feet activity did not occur: Safety/medical concerns        Walk 50 feet activity   Assist Walk 50 feet with 2 turns activity did not occur: Safety/medical concerns         Walk 150 feet activity   Assist Walk 150 feet activity did not occur: Safety/medical concerns         Walk 10 feet on uneven surface  activity   Assist Walk 10 feet on uneven surfaces activity did not occur: Safety/medical concerns         Wheelchair     Assist Is the patient using a wheelchair?: Yes (Pt will use chair, but unable to get to chair during eval d/t pain) Type of Wheelchair: Manual Wheelchair activity did not occur: Safety/medical concerns         Wheelchair 50 feet with 2 turns activity    Assist    Wheelchair 50 feet with 2 turns activity did not occur: Safety/medical concerns       Wheelchair 150 feet activity     Assist  Wheelchair 150 feet activity did not occur: Safety/medical concerns       Blood pressure 134/72, pulse 97, temperature 97.9 F (36.6 C), temperature source Oral, resp. rate 17, height '5\' 7"'$  (1.702 m), weight 55.7 kg, SpO2 100 %.  Medical Problem List and Plan: 1. Functional deficits secondary to severe cervical stenosis with myelopathy/epidural abscess.  Status post open posterior cervical lateral instrumentation posterior lateral fusion C3-4 4-5 C5-6 as well as laminotomies evacuation of epidural abscess C3-4 07/28/2022.  Cervical collar when out of bed.             -patient may not shower- due to PICC -ELOS/Goals: 13 to 17 days, modified independent and supervision with PT and OT           D/c 08/23/22  Con't CIR PT and OT- upset she got woken up this AM 2.  Antithrombotics: -DVT/anticoagulation:  Pharmaceutical: changed heparin to sq lovenox '40mg'$  daily to decrease sticks             -antiplatelet therapy: N/A 3. Pain Management: Oxycodone 5/'10mg'$  q3h  PRN, Robaxin '500mg'$  q6h as needed  -is taking prior to therapies with good results.   2/29- increased pain meds yesterday to 10-15 mg q 3 hours prn form 5-10 mg q3 hours prn- has substantially helped per pt.  4. Mood/Behavior/Sleep: Provide emotional support             -antipsychotic agents: N/A 5. Neuropsych/cognition: This patient is capable of making decisions on her own behalf. 6. Skin/Wound Care: Routine skin checks 7. Fluids/Electrolytes/Nutrition: encourage po. Weekly labs starting 08/06/22 -added protein supplement. Continue vitamins -consulted RD 2/22 -can have food  from home -begin megace trial '400mg'$  BID 2/28- Will check CMP next Monday 3/1- eating her breakfast rapidly when saw this AM 8.  ID.  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  Currently on vancomycin and ceftriaxone until 09/08/2022.    2/27- last trough 13- per pharmacy 9.  Hypertension.  Norvasc 5 mg daily.     -borderline 2/23--obsv today  3/1- BP controlled- con't regimen Vitals:   08/06/22 1651 08/06/22 2100 08/07/22 0559 08/07/22 1358  BP: (!) 120/54 120/62 128/61 117/62   08/07/22 1743 08/08/22 0546 08/08/22 1537 08/08/22 1939  BP: 129/72 (!) 123/58 123/67 126/70   08/09/22 0426 08/09/22 1406 08/09/22 1945 08/10/22 0300  BP: 125/73 126/70 132/71 134/72    10.  History of alcohol/tobacco/cannabis use.  Provide counseling, monitor for withdrawal 11.  Slow transit Constipation/neurogenic bowel: pt has flatus, intake minimal over last few days.  -encouraged po intake.    -can have food from home  -change colace to senna-s 2 tabs at bedtime             -continue daily miralax 17g - sorbitol 4m as needed -08/04/22 still no BM in several days, increase miralax to BID, restart colace '100mg'$  QD for now -08/05/22 BM x2 overnight, oversoftened, will back down miralax to QD, monitor -2/26 LBM night before last, continue to monitor, has been continent even when loose stool 2/27- says LBM 2 days ago- denies  constipation  2/28- no BM in actually 4 days- will order Sorbitol 30cc after therapy today.  2/29- no BM documented yet- will give soap suds enema after therapy today since no results with sorbitol-Sorbitol given 6:30pm yesterday- Will also check KUB to make sure on ileus or obstruction 3/1- KUB showed large stool burden- finally had 2 Bms in last 24 hours- large and unknown amount- con't regimen 12.  Aortic atherosclerosis             -Consider resume aspirin and atorvastatin on discharge 13. ABLA   -2/26 stable at HGB 9.6 14. Thrombocytosis  -Likely reactive, no intervention at this time 15. Urinary incontinence  2/27- pt insistent to use Purewick- had one last night and said she doesn't want to get wet- since has difficulty holding her urine- will order Purewick for now- d/w nursing  2/29- when pt needs to go, has urgency- let nursing know this AM   I spent a total of 36    minutes on total care today- >50% coordination of care- due to  Had to see twice this AM- pt refused to interact first interview since I "woke her up".   LOS: 9 days A FACE TO FACE EVALUATION WAS PERFORMED  Kristen Bautista 08/10/2022, 7:53 AM

## 2022-08-10 NOTE — Progress Notes (Signed)
Physical Therapy Weekly Progress Note  Patient Details  Name: Kristen Bautista MRN: [id number] Date of Birth: Oct 23, 1955  Beginning of progress report period: August 02, 2022 End of progress report period: August 10, 2022  Today's Date: 08/10/2022 PT Individual Time: 1100-1200, 1415-1445 PT Individual Time Calculation (min): 60 min, 30 min   Patient has met 3 of 3 short term goals.  Pt is progressing more quickly than expected. She ambulates with min A up to 200 ft with RW. Transfers with CGA overall. Pt continues to be limited by ataxia and coordination deficits.   Patient continues to demonstrate the following deficits muscle weakness, decreased cardiorespiratoy endurance, impaired timing and sequencing and ataxia, and decreased standing balance, decreased balance strategies, and difficulty maintaining precautions and therefore will continue to benefit from skilled PT intervention to increase functional independence with mobility.  Patient progressing toward long term goals..  Plan of care revisions: Upgraded to supervision gait goals d/t quick progress.  PT Short Term Goals Week 1:  PT Short Term Goal 1 (Week 1): pt will sit EOB with CGA or better PT Short Term Goal 1 - Progress (Week 1): Met PT Short Term Goal 2 (Week 1): Pt will perform STS with mod A consistently PT Short Term Goal 2 - Progress (Week 1): Met PT Short Term Goal 3 (Week 1): Pt will tolerate sitting OOB between sessions for improved activity tolerance PT Short Term Goal 3 - Progress (Week 1): Met Week 2:  PT Short Term Goal 1 (Week 2): Pt will ambulate with CGA consistently >200 ft PT Short Term Goal 2 (Week 2): Pt will perform STS with supervision PT Short Term Goal 3 (Week 2): Pt will initiate stair training  Skilled Therapeutic Interventions/Progress Updates:    Pt received in recliner and agreeable to therapy.  Pt reports pain managed on medication. Pt requested to use bathroom, ambulatory transfer with min A and RW  for continent bladder void, documented in flow sheet, supervision hygiene. Pt ambulated to sink and washed hands with supervision. Without rest break, Pt ambulated >200 ft to day room with CGA and RW. Demoes ataxic gait pattern which is improved from previous sessions. Pt does well with cues for increased cadence and step length. Pt then ambulated 2 x 170 ft with no AD. Requires min-mod A for balance and demoes shorter step length. Pt with difficulty standing from armless chair requiring min A. Pt then performed side stepping 2 x 10 ft BIL with x1/lap instance stepping over therapists foot for increased height and balance challenge. Transitioned to step taps with BUE supported on table and progressed to adding crossing arms over chest for balance challenge. Pt with improved coordination and balance with repetition. Pt ambulated back to room, with CGA and RW, demoing improved confidence and gait mechanics. Pt remained in recliner and was left with all needs in reach and alarm active.   Session 2: Pt received in recliner and agreeable to therapy.  No complaint of pain. Pt ambulates to bathroom with CGA and RW for continent bladder void, documented in flow sheet. Hand hygiene in standing with supervision. Pt ambulates to ortho gym with CGA and RW. Pt uses nustep x 10 min for global endurance and strengthening at level 5. Pt returned to room and to bed with supervision, was left with all needs in reach and alarm active.   Therapy Documentation Precautions:  Precautions Precautions: Cervical Precaution Comments: ok for out of brace in bed, for showering, and for short trips  to bathroom Required Braces or Orthoses: Cervical Brace Cervical Brace: Hard collar, Other (comment) Restrictions Weight Bearing Restrictions: No General:      Therapy/Group: Individual Therapy  Mickel Fuchs 08/10/2022, 12:05 PM

## 2022-08-10 NOTE — Progress Notes (Signed)
Occupational Therapy Weekly Progress Note  Patient Details  Name: Kristen Bautista MRN: [id number] Date of Birth: 05-26-1956  Beginning of progress report period: August 02, 2022 End of progress report period: August 10, 2022  Patient has met 2 of 3 short term goals.  Pt is making steady progress with BADLs and functional tranfers. Pt currently requires max A for dressing tasks and mod A for bathing tasks using AE PRN. Pt using modified utensils for self feeding with supervision after setup. Transfers with min/mod a with RW.   Patient continues to demonstrate the following deficits: muscle weakness and muscle joint tightness, decreased cardiorespiratoy endurance, impaired timing and sequencing and decreased coordination, and decreased standing balance, decreased postural control, and decreased balance strategies and therefore will continue to benefit from skilled OT intervention to enhance overall performance with BADL and iADL.  Patient progressing toward long term goals..  Continue plan of care.  OT Short Term Goals Week 1:  OT Short Term Goal 1 (Week 1): Pt will be able to sit EOB with min A in prep for UB dressing OT Short Term Goal 1 - Progress (Week 1): Met OT Short Term Goal 2 (Week 1): Pt will be able to come forward in prep for transfer with supervision OT Short Term Goal 2 - Progress (Week 1): Met OT Short Term Goal 3 (Week 1): Pt will transfer to toilet/BSC with mod A consistently OT Short Term Goal 3 - Progress (Week 1): Progressing toward goal OT Short Term Goal 4 (Week 1): Pt will thread LB clothing with min A Week 2:  OT Short Term Goal 1 (Week 2): Pt will thread LB clothing with min A OT Short Term Goal 2 (Week 2): Pt will perform toilet transfers with min A OT Short Term Goal 3 (Week 2): Pt will perfomr toileting tasks with mod A OT Short Term Goal 4 (Week 2): Pt will complete UB dressing tasks with mod A    [name] 08/10/2022, 6:56 AM

## 2022-08-10 NOTE — Progress Notes (Signed)
Occupational Therapy Session Note  Patient Details  Name: Kristen Bautista MRN: [id number] Date of Birth: [birth year]  Today's Date: 08/10/2022 OT Individual Time: 1300-1343 OT Individual Time Calculation (min): 43 min    Short Term Goals: Week 2:  OT Short Term Goal 1 (Week 2): Pt will thread LB clothing with min A OT Short Term Goal 2 (Week 2): Pt will perform toilet transfers with min A OT Short Term Goal 3 (Week 2): Pt will perfomr toileting tasks with mod A OT Short Term Goal 4 (Week 2): Pt will complete UB dressing tasks with mod A  Skilled Therapeutic Interventions/Progress Updates:    OT intervention with focus on grasp strength and FMC/GMC. Clothes pins task placing pins on dowel with RUE and LUE. Decreased control with open chain movments and decreased strength with pincer grip for clothes pins. R>L. Pt also placed and removed velcro checkers from board. Pt fatigues quickly and required rest breaks during tasks. Pt remained in w/c with all needs within reach and belt alarm activated.   Therapy Documentation Precautions:  Precautions Precautions: Cervical Precaution Comments: ok for out of brace in bed, for showering, and for short trips to bathroom Required Braces or Orthoses: Cervical Brace Cervical Brace: Hard collar, Other (comment) Restrictions Weight Bearing Restrictions: No Pain:  Pt reports increase pain in RUE fingertips with activity; rest   Therapy/Group: Individual Therapy  [name] 08/10/2022, 1:44 PM

## 2022-08-11 DIAGNOSIS — K5901 Slow transit constipation: Secondary | ICD-10-CM | POA: Diagnosis not present

## 2022-08-11 DIAGNOSIS — G959 Disease of spinal cord, unspecified: Secondary | ICD-10-CM | POA: Diagnosis not present

## 2022-08-11 NOTE — Progress Notes (Signed)
PROGRESS NOTE   Subjective/Complaints:  Pt doing well this morning, pain is well managed. Had large BM last night which was soft. Feels like she's doing better with her constipation. Urinating well. Sleeping well. Denies any other complaints at this time.   ROS:   Pt denies SOB, abd pain, CP, N/V/C/D, and vision changes  Except for HPI  Objective:   DG Abd 1 View  Result Date: 08/09/2022 CLINICAL DATA:  Constipation over the last 5 days EXAM: ABDOMEN - 1 VIEW COMPARISON:  None Available. FINDINGS: Prominent stool throughout the colon favors constipation. This extends down to the rectum. No dilated small bowel. No significant abnormal calcifications or findings of organomegaly. Tubing compatible with a small caliber central line projects over the lower SVC. IMPRESSION: 1. Prominent stool throughout the colon favors constipation. Electronically Signed   By: Van Clines M.D.   On: 08/09/2022 10:07   No results for input(s): "WBC", "HGB", "HCT", "PLT" in the last 72 hours.  No results for input(s): "NA", "K", "CL", "CO2", "GLUCOSE", "BUN", "CREATININE", "CALCIUM" in the last 72 hours.   Intake/Output Summary (Last 24 hours) at 08/11/2022 0715 Last data filed at 08/11/2022 0040 Gross per 24 hour  Intake 773 ml  Output 600 ml  Net 173 ml         Physical Exam: Vital Signs Blood pressure 126/70, pulse 77, temperature (!) 97.4 F (36.3 C), temperature source Oral, resp. rate 17, height '5\' 7"'$  (1.702 m), weight 55.7 kg, SpO2 99 %.   General: awake, alert, appropriate, sitting in bed eating breakfast, NAD HENT: conjugate gaze; oropharynx moist CV: regular rhythm; reg rate; no JVD Pulmonary: CTA B/L; no W/R/R- good air movement GI: soft, NT, ND, (+)BS throughout Psychiatric: pleasant and cooperative this morning Neurological: Ox3   PRIOR EXAM: Skin: Clean and intact without signs of breakdown. Wound cdi Neuro:  Alert  and oriented x 3. Fair insight and awareness. Functional Memory. Normal language and speech. Cranial nerve exam unremarkable. UE motor 3 prox to 4/5 distally with decreased LT at finger tips bilaterally. LE 3+ prox to 4/5 distally with decreased LT below ankle both feet.  decreased to light touch C5 downwards Musculoskeletal: neck tender even in collar with basic head movement    Assessment/Plan: 1. Functional deficits which require 3+ hours per day of interdisciplinary therapy in a comprehensive inpatient rehab setting. Physiatrist is providing close team supervision and 24 hour management of active medical problems listed below. Physiatrist and rehab team continue to assess barriers to discharge/monitor patient progress toward functional and medical goals  Care Tool:  Bathing        Body parts bathed by helper: Right arm, Left arm, Chest, Abdomen, Front perineal area, Buttocks, Right upper leg, Left upper leg, Right lower leg, Left lower leg, Face     Bathing assist Assist Level: Total Assistance - Patient < 25%     Upper Body Dressing/Undressing Upper body dressing   What is the patient wearing?: Hospital gown only    Upper body assist Assist Level: Total Assistance - Patient < 25%    Lower Body Dressing/Undressing Lower body dressing      What is the patient  wearing?: Underwear/pull up, Pants     Lower body assist Assist for lower body dressing: Total Assistance - Patient < 25%     Toileting Toileting    Toileting assist Assist for toileting: Moderate Assistance - Patient 50 - 74%     Transfers Chair/bed transfer  Transfers assist     Chair/bed transfer assist level: Moderate Assistance - Patient 50 - 74%     Locomotion Ambulation   Ambulation assist   Ambulation activity did not occur: Safety/medical concerns          Walk 10 feet activity   Assist  Walk 10 feet activity did not occur: Safety/medical concerns        Walk 50 feet  activity   Assist Walk 50 feet with 2 turns activity did not occur: Safety/medical concerns         Walk 150 feet activity   Assist Walk 150 feet activity did not occur: Safety/medical concerns         Walk 10 feet on uneven surface  activity   Assist Walk 10 feet on uneven surfaces activity did not occur: Safety/medical concerns         Wheelchair     Assist Is the patient using a wheelchair?: Yes (Pt will use chair, but unable to get to chair during eval d/t pain) Type of Wheelchair: Manual Wheelchair activity did not occur: Safety/medical concerns         Wheelchair 50 feet with 2 turns activity    Assist    Wheelchair 50 feet with 2 turns activity did not occur: Safety/medical concerns       Wheelchair 150 feet activity     Assist  Wheelchair 150 feet activity did not occur: Safety/medical concerns       Blood pressure 126/70, pulse 77, temperature (!) 97.4 F (36.3 C), temperature source Oral, resp. rate 17, height '5\' 7"'$  (1.702 m), weight 55.7 kg, SpO2 99 %.  Medical Problem List and Plan: 1. Functional deficits secondary to severe cervical stenosis with myelopathy/epidural abscess.  Status post open posterior cervical lateral instrumentation posterior lateral fusion C3-4 4-5 C5-6 as well as laminotomies evacuation of epidural abscess C3-4 07/28/2022.  Cervical collar when out of bed.             -patient may not shower- due to PICC -ELOS/Goals: 13 to 17 days, modified independent and supervision with PT and OT             -D/c 08/23/22  -Con't CIR PT and OT- upset she got woken up this AM 2.  Antithrombotics: -DVT/anticoagulation:  Pharmaceutical: changed heparin to sq lovenox '40mg'$  daily to decrease sticks             -antiplatelet therapy: N/A 3. Pain Management: Oxycodone 5/'10mg'$  q3h PRN, Robaxin '500mg'$  q6h as needed  -is taking prior to therapies with good results.  -2/29- increased pain meds yesterday to 10-15 mg q 3 hours prn form  5-10 mg q3 hours prn- has substantially helped per pt.  4. Mood/Behavior/Sleep: Provide emotional support             -antipsychotic agents: N/A 5. Neuropsych/cognition: This patient is capable of making decisions on her own behalf. 6. Skin/Wound Care: Routine skin checks 7. Fluids/Electrolytes/Nutrition: encourage po. Weekly labs next 08/13/22 -added protein supplement. Continue vitamins -consulted RD 2/22 -can have food from home -begin megace trial '400mg'$  BID -2/28- Will check CMP next Monday -08/11/22 eating well this morning 8.  ID.  Evacuation  of epidural abscess C3-4.  Currently on vancomycin and ceftriaxone until 09/08/2022.    -2/27- last trough 13- per pharmacy 9.  Hypertension.  Norvasc 5 mg daily.     -borderline 2/23--obsv today  -08/11/22 BP controlled- con't regimen Vitals:   08/07/22 1358 08/07/22 1743 08/08/22 0546 08/08/22 1537  BP: 117/62 129/72 (!) 123/58 123/67   08/08/22 1939 08/09/22 0426 08/09/22 1406 08/09/22 1945  BP: 126/70 125/73 126/70 132/71   08/10/22 0300 08/10/22 1247 08/10/22 1933 08/11/22 0319  BP: 134/72 102/65 130/67 126/70    10.  History of alcohol/tobacco/cannabis use.  Provide counseling, monitor for withdrawal 11.  Slow transit Constipation/neurogenic bowel: pt has flatus, intake minimal over last few days.  -encouraged po intake, can have food from home  -change colace to senna-s 2 tabs at bedtime             -continue daily miralax 17g, sorbitol 17m as needed -08/04/22 still no BM in several days, increase miralax to BID, restart colace '100mg'$  QD for now -08/05/22 BM x2 overnight, oversoftened, will back down miralax to QD, monitor -2/26 LBM night before last, continue to monitor, has been continent even when loose stool -2/27- says LBM 2 days ago- denies constipation -2/28- no BM in actually 4 days- will order Sorbitol 30cc after therapy today.  -2/29- no BM documented yet- will give soap suds enema after therapy today since no results with  sorbitol-Sorbitol given 6:30pm yesterday- Will also check KUB to make sure on ileus or obstruction -3/1- KUB showed large stool burden- finally had 2 Bms in last 24 hours- large and unknown amount- con't regimen -08/11/22 large BM yesterday, cont regimen.  12.  Aortic atherosclerosis             -Consider resume aspirin and atorvastatin on discharge 13. ABLA   -2/26 stable at HGB 9.6, monitor on weekly labs next 08/13/22 14. Thrombocytosis  -Likely reactive, no intervention at this time 15. Urinary incontinence -2/27- pt insistent to use Purewick- had one last night and said she doesn't want to get wet- since has difficulty holding her urine- will order Purewick for now- d/w nursing  -2/29- when pt needs to go, has urgency- let nursing know this AM  -08/11/22 no incontinence overnight, urinating well, monitor    LOS: 10 days [place]/07/2022, 7:15 AM

## 2022-08-11 NOTE — Progress Notes (Signed)
Pharmacy Antibiotic Note  Kristen Bautista is a 67 y.o. female admitted on 08/01/2022 with cervical epidural abscess.  Pharmacy has been consulted for vancomycin dosing.  Patient is also on Rocephin.  Last Scr (2/26) 0.84 mg/dL  Plan: Continue vanc '500mg'$  IV Q12H Continue Rocephin 2gm IV Q24H per MD Monitor renal fxn, clinical progress, weekly vanc levels Per ID, plan for 6 weeks of abx from OR on 07/28/22 (End date: 09/08/22)  Height: '5\' 7"'$  (170.2 cm) Weight: 55.7 kg (122 lb 12.7 oz) IBW/kg (Calculated) : 61.6  Temp (24hrs), Avg:98 F (36.7 C), Min:97.4 F (36.3 C), Max:98.5 F (36.9 C)  Recent Labs  Lab 08/06/22 0520 08/08/22 0953  WBC 8.7  --   CREATININE 0.84  --   VANCOTROUGH  --  12*     Estimated Creatinine Clearance: 57.9 mL/min (by C-G formula based on SCr of 0.84 mg/dL).    No Known Allergies  Antimicrobials: Vanc 2/16 >> 09/08/22 Zosyn 2/16 >>2/18 CTX 2/18 >> 09/08/22  Monitoring: 2/18 VP/VT 33/21 on '750mg'$  q12 >> decr '500mg'$  q12 2/21 VT 13 (estimated AUC 480) >> no change 2/28 VT 12 drawn ~ 12 hours >> no changes at this time  Microbiology Results: 2/16 MRSA PCR - negative 2/16 BCx - negative 2/17 cervical tissue - negative  Francena Hanly, PharmD Pharmacy Resident  08/11/2022 9:35 AM

## 2022-08-12 NOTE — Progress Notes (Signed)
Occupational Therapy Session Note  Patient Details  Name: JAMES BURROW MRN: [id number] Date of Birth: 1955/12/08  Today's Date: 08/12/2022 OT Individual Time: 1000-1100 OT Individual Time Calculation (min): 60 min    Short Term Goals: Week 2:  OT Short Term Goal 1 (Week 2): Pt will thread LB clothing with min A OT Short Term Goal 2 (Week 2): Pt will perform toilet transfers with min A OT Short Term Goal 3 (Week 2): Pt will perfomr toileting tasks with mod A OT Short Term Goal 4 (Week 2): Pt will complete UB dressing tasks with mod A  Skilled Therapeutic Interventions/Progress Updates:    Pt resting in recliner upon arrival and agreeable to therapy. OT intervention with focus on BUE FMC/GMC activities to increase pt's independence with BADLs. Pt completed three (3) peg board patterns with focus on RUE use. Pt also palyed 3 games of Tonl with standard playing cards. Pt able to shuffle, deal, and manage cards. Dysmetria improving L>R. A deck of playing cards left in room for pt to practice.   Therapy Documentation Precautions:  Precautions Precautions: Cervical Precaution Comments: ok for out of brace in bed, for showering, and for short trips to bathroom Required Braces or Orthoses: Cervical Brace Cervical Brace: Hard collar, Other (comment) Restrictions Weight Bearing Restrictions: No   Pain:  Pt denies pain this morning    Therapy/Group: Individual Therapy  [name] 08/12/2022, 12:08 PM

## 2022-08-12 NOTE — Progress Notes (Signed)
PROGRESS NOTE   Subjective/Complaints:  Pt doing well again this morning, continues to have adequate BMs-- had one just prior to eval. Soft and formed, "like a snake". Denies diarrhea, just a lot of BMs-- has been eating better. Pain is still well managed. Still has paresthesias in hands/feet but not worsening. Urinating well. Sleeping well. Denies any other complaints at this time.   ROS: +paresthesias hands/feet-stable  Pt denies SOB, abd pain, CP, N/V/C/D, and vision changes  Except for HPI  Objective:   No results found. No results for input(s): "WBC", "HGB", "HCT", "PLT" in the last 72 hours.  No results for input(s): "NA", "K", "CL", "CO2", "GLUCOSE", "BUN", "CREATININE", "CALCIUM" in the last 72 hours.   Intake/Output Summary (Last 24 hours) at 08/12/2022 0658 Last data filed at 08/12/2022 0457 Gross per 24 hour  Intake 360 ml  Output 500 ml  Net -140 ml         Physical Exam: Vital Signs Blood pressure 137/67, pulse 87, temperature 99.2 F (37.3 C), temperature source Oral, resp. rate 16, height '5\' 7"'$  (1.702 m), weight 55.7 kg, SpO2 97 %.   General: awake, alert, appropriate, getting off BSC and transferring to chair, NAD HENT: conjugate gaze; oropharynx moist CV: regular rhythm; reg rate; no JVD Pulmonary: CTA B/L; no W/R/R- good air movement GI: soft, NT, ND, (+)BS throughout Psychiatric: pleasant and cooperative this morning Neurological: Ox3 Skin: Clean and intact without signs of breakdown. Wound cdi but dressing peeling away, asked nursing to change.   PRIOR EXAM: Neuro:  Alert and oriented x 3. Fair insight and awareness. Functional Memory. Normal language and speech. Cranial nerve exam unremarkable. UE motor 3 prox to 4/5 distally with decreased LT at finger tips bilaterally. LE 3+ prox to 4/5 distally with decreased LT below ankle both feet.  decreased to light touch C5 downwards Musculoskeletal:  neck tender even in collar with basic head movement    Assessment/Plan: 1. Functional deficits which require 3+ hours per day of interdisciplinary therapy in a comprehensive inpatient rehab setting. Physiatrist is providing close team supervision and 24 hour management of active medical problems listed below. Physiatrist and rehab team continue to assess barriers to discharge/monitor patient progress toward functional and medical goals  Care Tool:  Bathing        Body parts bathed by helper: Right arm, Left arm, Chest, Abdomen, Front perineal area, Buttocks, Right upper leg, Left upper leg, Right lower leg, Left lower leg, Face     Bathing assist Assist Level: Total Assistance - Patient < 25%     Upper Body Dressing/Undressing Upper body dressing   What is the patient wearing?: Hospital gown only    Upper body assist Assist Level: Total Assistance - Patient < 25%    Lower Body Dressing/Undressing Lower body dressing      What is the patient wearing?: Underwear/pull up, Pants     Lower body assist Assist for lower body dressing: Total Assistance - Patient < 25%     Toileting Toileting    Toileting assist Assist for toileting: Moderate Assistance - Patient 50 - 74%     Transfers Chair/bed transfer  Transfers assist  Chair/bed transfer assist level: Moderate Assistance - Patient 50 - 74%     Locomotion Ambulation   Ambulation assist   Ambulation activity did not occur: Safety/medical concerns          Walk 10 feet activity   Assist  Walk 10 feet activity did not occur: Safety/medical concerns        Walk 50 feet activity   Assist Walk 50 feet with 2 turns activity did not occur: Safety/medical concerns         Walk 150 feet activity   Assist Walk 150 feet activity did not occur: Safety/medical concerns         Walk 10 feet on uneven surface  activity   Assist Walk 10 feet on uneven surfaces activity did not occur:  Safety/medical concerns         Wheelchair     Assist Is the patient using a wheelchair?: Yes (Pt will use chair, but unable to get to chair during eval d/t pain) Type of Wheelchair: Manual Wheelchair activity did not occur: Safety/medical concerns         Wheelchair 50 feet with 2 turns activity    Assist    Wheelchair 50 feet with 2 turns activity did not occur: Safety/medical concerns       Wheelchair 150 feet activity     Assist  Wheelchair 150 feet activity did not occur: Safety/medical concerns       Blood pressure 137/67, pulse 87, temperature 99.2 F (37.3 C), temperature source Oral, resp. rate 16, height '5\' 7"'$  (1.702 m), weight 55.7 kg, SpO2 97 %.  Medical Problem List and Plan: 1. Functional deficits secondary to severe cervical stenosis with myelopathy/epidural abscess.  Status post open posterior cervical lateral instrumentation posterior lateral fusion C3-4 4-5 C5-6 as well as laminotomies evacuation of epidural abscess C3-4 07/28/2022.  Cervical collar when out of bed.             -patient may not shower- due to PICC -ELOS/Goals: 13 to 17 days, modified independent and supervision with PT and OT             -D/c 08/23/22  -Con't CIR PT and OT 2.  Antithrombotics: -DVT/anticoagulation:  Pharmaceutical: changed heparin to sq lovenox '40mg'$  daily to decrease sticks             -antiplatelet therapy: N/A 3. Pain Management: Oxycodone 5/'10mg'$  q3h PRN, Robaxin '500mg'$  q6h as needed  -is taking prior to therapies with good results.  -2/29- increased pain meds yesterday to 10-15 mg q 3 hours prn form 5-10 mg q3 hours prn- has substantially helped per pt.  4. Mood/Behavior/Sleep: Provide emotional support             -antipsychotic agents: N/A 5. Neuropsych/cognition: This patient is capable of making decisions on her own behalf. 6. Skin/Wound Care: Routine skin checks 7. Fluids/Electrolytes/Nutrition: encourage po. Weekly labs next 08/13/22 -added protein  supplement. Continue vitamins -consulted RD 2/22 -can have food from home -begin megace trial '400mg'$  BID -2/28- Will check CMP next Monday -08/11/22 eating well this morning 8.  ID.  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  Currently on vancomycin and ceftriaxone until 09/08/2022.    -2/27- last trough 13- per pharmacy 9.  Hypertension.  Norvasc 5 mg daily.     -borderline 2/23--obsv today  -08/12/22 BP controlled- con't regimen Vitals:   08/08/22 1537 08/08/22 1939 08/09/22 0426 08/09/22 1406  BP: 123/67 126/70 125/73 126/70   08/09/22 1945 08/10/22 0300  08/10/22 1247 08/10/22 1933  BP: 132/71 134/72 102/65 130/67   08/11/22 0319 08/11/22 1440 08/11/22 1930 08/12/22 0427  BP: 126/70 124/71 120/64 137/67    10.  History of alcohol/tobacco/cannabis use.  Provide counseling, monitor for withdrawal 11.  Slow transit Constipation/neurogenic bowel: pt has flatus, intake minimal over last few days.  -encouraged po intake, can have food from home  -change colace to senna-s 2 tabs at bedtime             -continue daily miralax 17g, sorbitol 75m as needed -08/04/22 still no BM in several days, increase miralax to BID, restart colace '100mg'$  QD for now -08/05/22 BM x2 overnight, oversoftened, will back down miralax to QD, monitor -2/26 LBM night before last, continue to monitor, has been continent even when loose stool -2/27- says LBM 2 days ago- denies constipation -2/28- no BM in actually 4 days- will order Sorbitol 30cc after therapy today.  -2/29- no BM documented yet- will give soap suds enema after therapy today since no results with sorbitol-Sorbitol given 6:30pm yesterday- Will also check KUB to make sure on ileus or obstruction -3/1- KUB showed large stool burden- finally had 2 Bms in last 24 hours- large and unknown amount- con't regimen -08/11/22 large BM yesterday, cont regimen.  -08/12/22 had 12 stools documented yesterday, but pt denies diarrhea-- all soft/formed, had one this morning. Cont  regimen.  12.  Aortic atherosclerosis             -Consider resume aspirin and atorvastatin on discharge 13. ABLA   -2/26 stable at HGB 9.6, monitor on weekly labs next 08/13/22 14. Thrombocytosis  -Likely reactive, no intervention at this time 15. Urinary incontinence -2/27- pt insistent to use Purewick- had one last night and said she doesn't want to get wet- since has difficulty holding her urine- will order Purewick for now- d/w nursing  -2/29- when pt needs to go, has urgency- let nursing know this AM  -08/11/22 no incontinence overnight, urinating well, monitor    LOS: 11 days A FACE TO FChiloquin3/08/2022, 6:58 AM

## 2022-08-12 NOTE — Progress Notes (Signed)
Occupational Therapy Session Note  Patient Details  Name: Kristen Bautista MRN: [id number] Date of Birth: 1955/10/18  Today's Date: 08/12/2022 OT Individual Time: 1345-1430 OT Individual Time Calculation (min): 45 min    Short Term Goals: Week 2:  OT Short Term Goal 1 (Week 2): Pt will thread LB clothing with min A OT Short Term Goal 2 (Week 2): Pt will perform toilet transfers with min A OT Short Term Goal 3 (Week 2): Pt will perfomr toileting tasks with mod A OT Short Term Goal 4 (Week 2): Pt will complete UB dressing tasks with mod A  Skilled Therapeutic Interventions/Progress Updates:    PT resting in recliner upon arrival. Pt participated in 9 hole peg and box and block tests. Pt provided paper and pen and practiced handwriting.  9 Hole Peg Test is used to measure finger dexterity in pts with various neurological diagnoses. - Instructions The pt was instructed to pick up the pegs one at a time, using their dominant hand first and put them into the holes in any order until the holes were all filled. The pt then removed the pegs one at a time and returned them to the container. Both hands were tested separately.  - Results The pt completed the test in 72 seconds with LUE. Unable to complete with RUE. Scores are based on the time taken to complete the activity. The timer started the moment the pt touched the first peg until the moment the last peg hit the container.  - Norms for healthy females ages 45-70+ 48-55 R 17.38 L 18.92 56-60 R 17.86 L 19.48 61-65 R 18.99 L 20.33 66-70 R 19.90 L 21.44 71+ R 22.49 L 24.11  Box and Blocks Test measures unilateral gross manual dexterity. - Instructions The pt was instructed to carry one block over at a time and go as quickly as they could, making sure their fingertips crossed the partition. One minute was given to complete the task per UE. The pt was allowed a 15-second trial period prior to testing if needed. - Results The pt  transferred 24blocks with the R hand and 44 with the L hand. The total number of blocks carried from one compartment to the other in one minute is scored per hand. Higher scores on the test indicate better gross manual dexterity.  - Norms for adults females 50-75+ 50-54 R 77.7 L 74.3 55-59 R 74.7 L 73.6 60-64 R 76.1 L 73.6 65-69 R 72 L 71.3 70-74 R 68.6 L 68.3 75+ R 65.0 L 63.6  Pt practiced printing and writing name, address, children's names with improvement noted with practice.   Pt remained seated in recliner. Belt alarm activated. All needs within reach.    Therapy Documentation Precautions:  Precautions Precautions: Cervical Precaution Comments: ok for out of brace in bed, for showering, and for short trips to bathroom Required Braces or Orthoses: Cervical Brace Cervical Brace: Hard collar, Other (comment) Restrictions Weight Bearing Restrictions: No Pain:  Pt reports "tightness" in RUE; stretching and activity   Therapy/Group: Individual Therapy  [name] 08/12/2022, 2:35 PM

## 2022-08-13 LAB — COMPREHENSIVE METABOLIC PANEL
ALT: 23 U/L (ref 0–44)
AST: 29 U/L (ref 15–41)
Albumin: 2.9 g/dL — ABNORMAL LOW (ref 3.5–5.0)
Alkaline Phosphatase: 49 U/L (ref 38–126)
Anion gap: 8 (ref 5–15)
BUN: 23 mg/dL (ref 8–23)
CO2: 25 mmol/L (ref 22–32)
Calcium: 9.5 mg/dL (ref 8.9–10.3)
Chloride: 103 mmol/L (ref 98–111)
Creatinine, Ser: 0.8 mg/dL (ref 0.44–1.00)
GFR, Estimated: >60 mL/min (ref >60)
Glucose, Bld: 108 mg/dL — ABNORMAL HIGH (ref 70–99)
Potassium: 4.2 mmol/L (ref 3.5–5.1)
Sodium: 136 mmol/L (ref 135–145)
Total Bilirubin: 0.6 mg/dL (ref 0.3–1.2)
Total Protein: 7.6 g/dL (ref 6.5–8.1)

## 2022-08-13 LAB — CBC
HCT: 28.3 % — ABNORMAL LOW (ref 36.0–46.0)
Hemoglobin: 9.5 g/dL — ABNORMAL LOW (ref 12.0–15.0)
MCH: 28.8 pg (ref 26.0–34.0)
MCHC: 33.6 g/dL (ref 30.0–36.0)
MCV: 85.8 fL (ref 80.0–100.0)
Platelets: 415 10*3/uL — ABNORMAL HIGH (ref 150–400)
RBC: 3.3 MIL/uL — ABNORMAL LOW (ref 3.87–5.11)
RDW: 13.5 % (ref 11.5–15.5)
WBC: 11.9 10*3/uL — ABNORMAL HIGH (ref 4.0–10.5)
nRBC: 0 % (ref 0.0–0.2)

## 2022-08-13 LAB — URINALYSIS, ROUTINE W REFLEX MICROSCOPIC
Bilirubin Urine: NEGATIVE
Glucose, UA: NEGATIVE mg/dL
Hgb urine dipstick: NEGATIVE
Ketones, ur: NEGATIVE mg/dL
Leukocytes,Ua: NEGATIVE
Nitrite: NEGATIVE
Protein, ur: NEGATIVE mg/dL
Specific Gravity, Urine: 1.012 (ref 1.005–1.030)
pH: 7 (ref 5.0–8.0)

## 2022-08-13 MED ORDER — GABAPENTIN 300 MG PO CAPS
300.0000 mg | ORAL_CAPSULE | Freq: Two times a day (BID) | ORAL | Status: DC
  Administered 2022-08-13 – 2022-08-23 (×21): 300 mg via ORAL
  Filled 2022-08-13 (×21): qty 1

## 2022-08-13 NOTE — Progress Notes (Signed)
Occupational Therapy Session Note  Patient Details  Name: Kristen Bautista MRN: [id number] Date of Birth: 02/10/1956  Today's Date: 08/13/2022 OT Individual Time: [id number] Individual Time Calculation (min): 75 min    Short Term Goals: Week 2:  OT Short Term Goal 1 (Week 2): Pt will thread LB clothing with min A OT Short Term Goal 2 (Week 2): Pt will perform toilet transfers with min A OT Short Term Goal 3 (Week 2): Pt will perfomr toileting tasks with mod A OT Short Term Goal 4 (Week 2): Pt will complete UB dressing tasks with mod A  Skilled Therapeutic Interventions/Progress Updates:    Pt resting in bed upon arrival and agreeable to sitting EOB for BUE FMC/GMC activities. Supine>sit EOB with supervision. Sitting balance with supervision. Pt used RUE to grasp large pegs and place in peg board. After rest, pt removed pegs and placed in container. Pt reports decreased sensation in RUE fingers which makes task challenging. Pt able to complete task with more then reasonable amount of time. Pt practiced stacking medium sized washers with LUE. Pt completed in hand manipulation tasks with LUE. Washers left with pt to practice. Pt used BUE to thread beads onto string-holding string with RUE and threading with LUE. Pt removed all beads and returned to container. Pt returned to supine with supervision. All needs within reach. Bed alarm activated.   Therapy Documentation Precautions:  Precautions Precautions: Cervical Precaution Comments: ok for out of brace in bed, for showering, and for short trips to bathroom Required Braces or Orthoses: Cervical Brace Cervical Brace: Hard collar, Other (comment) Restrictions Weight Bearing Restrictions: No Pain: Pain Assessment Pain Scale: 0-10 Pain Score: 0-No pain    Therapy/Group: Individual Therapy  [name] 08/13/2022, 11:03 AM

## 2022-08-13 NOTE — Progress Notes (Signed)
PROGRESS NOTE   Subjective/Complaints:  Pt reports pain overall is "good" However still having tingling of hands and feet- stable, but bothersome.  Has collar off LBM last night- formed, but soft.  Feels like has shoes on feet at all times.    Doesn't have dysuria, but still having a lot of frequency and urgency  ROS:   Pt denies SOB, abd pain, CP, N/V/C/D, and vision changes  Except for HPI  Objective:   No results found. Recent Labs    08/13/22 0444  WBC 11.9*  HGB 9.5*  HCT 28.3*  PLT 415*    Recent Labs    08/13/22 0444  NA 136  K 4.2  CL 103  CO2 25  GLUCOSE 108*  BUN 23  CREATININE 0.80  CALCIUM 9.5     Intake/Output Summary (Last 24 hours) at 08/13/2022 0929 Last data filed at 08/13/2022 0745 Gross per 24 hour  Intake 717 ml  Output 500 ml  Net 217 ml        Physical Exam: Vital Signs Blood pressure 122/83, pulse 77, temperature 98.1 F (36.7 C), temperature source Oral, resp. rate 16, height '5\' 7"'$  (1.702 m), weight 55.7 kg, SpO2 100 %.    General: awake, alert, appropriate, sitting up in bed; NAD HENT: conjugate gaze; oropharynx moist CV: regular rate; no JVD Pulmonary: CTA B/L; no W/R/R- good air movement GI: soft, NT, ND, (+)BS Psychiatric: appropriate- less irritable Neurological: Ox3  Skin: Clean and intact without signs of breakdown. Wound cdi but dressing peeling away, asked nursing to change.   PRIOR EXAM: Neuro:  Alert and oriented x 3. Fair insight and awareness. Functional Memory. Normal language and speech. Cranial nerve exam unremarkable. UE motor 3 prox to 4/5 distally with decreased LT at finger tips bilaterally. LE 3+ prox to 4/5 distally with decreased LT below ankle both feet.  decreased to light touch C5 downwards Musculoskeletal: neck tender even in collar with basic head movement    Assessment/Plan: 1. Functional deficits which require 3+ hours per day of  interdisciplinary therapy in a comprehensive inpatient rehab setting. Physiatrist is providing close team supervision and 24 hour management of active medical problems listed below. Physiatrist and rehab team continue to assess barriers to discharge/monitor patient progress toward functional and medical goals  Care Tool:  Bathing        Body parts bathed by helper: Right arm, Left arm, Chest, Abdomen, Front perineal area, Buttocks, Right upper leg, Left upper leg, Right lower leg, Left lower leg, Face     Bathing assist Assist Level: Total Assistance - Patient < 25%     Upper Body Dressing/Undressing Upper body dressing   What is the patient wearing?: Hospital gown only    Upper body assist Assist Level: Total Assistance - Patient < 25%    Lower Body Dressing/Undressing Lower body dressing      What is the patient wearing?: Underwear/pull up, Pants     Lower body assist Assist for lower body dressing: Total Assistance - Patient < 25%     Toileting Toileting    Toileting assist Assist for toileting: Moderate Assistance - Patient 50 - 74%  Transfers Chair/bed transfer  Transfers assist     Chair/bed transfer assist level: Moderate Assistance - Patient 50 - 74%     Locomotion Ambulation   Ambulation assist   Ambulation activity did not occur: Safety/medical concerns          Walk 10 feet activity   Assist  Walk 10 feet activity did not occur: Safety/medical concerns        Walk 50 feet activity   Assist Walk 50 feet with 2 turns activity did not occur: Safety/medical concerns         Walk 150 feet activity   Assist Walk 150 feet activity did not occur: Safety/medical concerns         Walk 10 feet on uneven surface  activity   Assist Walk 10 feet on uneven surfaces activity did not occur: Safety/medical concerns         Wheelchair     Assist Is the patient using a wheelchair?: Yes (Pt will use chair, but unable to get  to chair during eval d/t pain) Type of Wheelchair: Manual Wheelchair activity did not occur: Safety/medical concerns         Wheelchair 50 feet with 2 turns activity    Assist    Wheelchair 50 feet with 2 turns activity did not occur: Safety/medical concerns       Wheelchair 150 feet activity     Assist  Wheelchair 150 feet activity did not occur: Safety/medical concerns       Blood pressure 122/83, pulse 77, temperature 98.1 F (36.7 C), temperature source Oral, resp. rate 16, height '5\' 7"'$  (1.702 m), weight 55.7 kg, SpO2 100 %.  Medical Problem List and Plan: 1. Functional deficits secondary to severe cervical stenosis with myelopathy/epidural abscess.  Status post open posterior cervical lateral instrumentation posterior lateral fusion C3-4 4-5 C5-6 as well as laminotomies evacuation of epidural abscess C3-4 07/28/2022.  Cervical collar when out of bed.             -patient may not shower- due to PICC -ELOS/Goals: 13 to 17 days, modified independent and supervision with PT and OT             -D/c 08/23/22  -Con't CIR PT and OT 2.  Antithrombotics: -DVT/anticoagulation:  Pharmaceutical: changed heparin to sq lovenox '40mg'$  daily to decrease sticks             -antiplatelet therapy: N/A 3. Pain Management: Oxycodone 5/'10mg'$  q3h PRN, Robaxin '500mg'$  q6h as needed  -is taking prior to therapies with good results.  -2/29- increased pain meds yesterday to 10-15 mg q 3 hours prn form 5-10 mg q3 hours prn- has substantially helped per pt.  3/4- will add Gabapentin 300 mg BID for nerve pain- monitor for side effects 4. Mood/Behavior/Sleep: Provide emotional support             -antipsychotic agents: N/A 5. Neuropsych/cognition: This patient is capable of making decisions on her own behalf. 6. Skin/Wound Care: Routine skin checks 7. Fluids/Electrolytes/Nutrition: encourage po. Weekly labs next 08/13/22 -added protein supplement. Continue vitamins -consulted RD 2/22 -can have food  from home -begin megace trial '400mg'$  BID -2/28- Will check CMP next Monday -08/11/22 eating well this morning 3/4- Albumin up to 2.9 from 2.6 8.  ID.  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  Currently on vancomycin and ceftriaxone until 09/08/2022.    -2/27- last trough 13- per pharmacy 9.  Hypertension.  Norvasc 5 mg daily.     -borderline  2/23--obsv today  -08/12/22 BP controlled- con't regimen Vitals:   08/09/22 1945 08/10/22 0300 08/10/22 1247 08/10/22 1933  BP: 132/71 134/72 102/65 130/67   08/11/22 0319 08/11/22 1440 08/11/22 1930 08/12/22 0427  BP: 126/70 124/71 120/64 137/67   08/12/22 1435 08/12/22 1922 08/13/22 0423 08/13/22 0804  BP: 117/69 125/67 (!) 126/54 122/83    10.  History of alcohol/tobacco/cannabis use.  Provide counseling, monitor for withdrawal 11.  Slow transit Constipation/neurogenic bowel: pt has flatus, intake minimal over last few days.  -encouraged po intake, can have food from home  -change colace to senna-s 2 tabs at bedtime             -continue daily miralax 17g, sorbitol 29m as needed -08/04/22 still no BM in several days, increase miralax to BID, restart colace '100mg'$  QD for now -08/05/22 BM x2 overnight, oversoftened, will back down miralax to QD, monitor -2/26 LBM night before last, continue to monitor, has been continent even when loose stool -2/27- says LBM 2 days ago- denies constipation  -08/12/22 had 12 stools documented yesterday, but pt denies diarrhea-- all soft/formed, had one this morning. Cont regimen.  3/4- bowels working well finally 12.  Aortic atherosclerosis             -Consider resume aspirin and atorvastatin on discharge 13. ABLA   -2/26 stable at HGB 9.6, monitor on weekly labs next 08/13/22  3/4- stable- Hb 9.5 14. Thrombocytosis  -Likely reactive, no intervention at this time 15. Urinary incontinence -2/27- pt insistent to use Purewick- had one last night and said she doesn't want to get wet- since has difficulty holding her urine-  will order Purewick for now- d/w nursing  -2/29- when pt needs to go, has urgency- let nursing know this AM  -08/11/22 no incontinence overnight, urinating well, monitor  3/4- will check U/A and Cx due to urgency and frequency since WBC up to 11.9 16. Leukocytosis  3/4- will check U/A and Cx-    I spent a total of  39  minutes on total care today- >50% coordination of care- due to  D/w with pt about nerve pain- also d/w nursing about U/A and Cx- and will f/u on it today   LOS: 12 days A FACE TO FACE EVALUATION WAS PERFORMED  Kristen Bautista 08/13/2022, 9:29 AM

## 2022-08-13 NOTE — Progress Notes (Signed)
Physical Therapy Session Note  Patient Details  Name: Kristen Bautista MRN: [id number] Date of Birth: 1955/12/05  Today's Date: 08/13/2022 PT Individual Time: 0800-0900 PT Individual Time Calculation (min): 60 min   Short Term Goals: Week 2:  PT Short Term Goal 1 (Week 2): Pt will ambulate with CGA consistently >200 ft PT Short Term Goal 2 (Week 2): Pt will perform STS with supervision PT Short Term Goal 3 (Week 2): Pt will initiate stair training  Skilled Therapeutic Interventions/Progress Updates:    pt received in bed and agreeable to therapy. No complaint of pain. Nsg present for meds pass and to start IV abx. Bed mobility with supervision, donned brace with min A. Pt ambulated to gym with CGA and RW. Cues for upright posture and RW proximity. Pt navigated 6" stairs x 12 with both hand rails and min A fading to CGA for balance. Pt then performed 4 x 10 bil step ups + step tap with CL foot for strength and coordination, which improved with repetition. During rest breaks, discussed follow up therapies,as well  as pt asking about various exercise equipment she has seen on TV. Discussed that therapist will provide HEP for appropriate exercises. Pt returned to room and to bed with supervision to attempt short nap before OT session, was left with all needs in reach and alarm active.   Therapy Documentation Precautions:  Precautions Precautions: Cervical Precaution Comments: ok for out of brace in bed, for showering, and for short trips to bathroom Required Braces or Orthoses: Cervical Brace Cervical Brace: Hard collar, Other (comment) Restrictions Weight Bearing Restrictions: No General:       Therapy/Group: Individual Therapy  Mickel Fuchs 08/13/2022, 10:52 AM

## 2022-08-13 NOTE — Progress Notes (Signed)
Occupational Therapy Session Note  Patient Details  Name: Kristen Bautista MRN: [id number] Date of Birth: 08-Apr-1956  {CHL IP REHAB OT TIME CALCULATIONS:[id number]}   Short Term Goals: Week 2:  OT Short Term Goal 1 (Week 2): Pt will thread LB clothing with min A OT Short Term Goal 2 (Week 2): Pt will perform toilet transfers with min A OT Short Term Goal 3 (Week 2): Pt will perfomr toileting tasks with mod A OT Short Term Goal 4 (Week 2): Pt will complete UB dressing tasks with mod A  Skilled Therapeutic Interventions/Progress Updates:  Pt received *** for skilled OT session with focus on ***. Pt agreeable to interventions, demonstrating overall *** mood. Pt reported ***/10 pain, stating "***" in reference to ***. OT offering intermediate rest breaks and positioning suggestions throughout session to address pain/fatigue and maximize participation/safety in session.    Pt remained *** with all immediate needs met at end of session. Pt continues to be appropriate for skilled OT intervention to promote further functional independence.   Therapy Documentation Precautions:  Precautions Precautions: Cervical Precaution Comments: ok for out of brace in bed, for showering, and for short trips to bathroom Required Braces or Orthoses: Cervical Brace Cervical Brace: Hard collar, Other (comment) Restrictions Weight Bearing Restrictions: No  Therapy/Group: Individual Therapy  Maudie Mercury, OTR/L, MSOT  08/13/2022, 3:44 PM

## 2022-08-13 NOTE — Progress Notes (Signed)
Physical Therapy Session Note  Patient Details  Name: Kristen Bautista MRN: [id number] Date of Birth: 10-Sep-1955  Today's Date: 08/13/2022 PT Individual Time: 0130-0245 PT Individual Time Calculation (min): 75 min   Short Term Goals: Week 2:  PT Short Term Goal 1 (Week 2): Pt will ambulate with CGA consistently >200 ft PT Short Term Goal 2 (Week 2): Pt will perform STS with supervision PT Short Term Goal 3 (Week 2): Pt will initiate stair training  Skilled Therapeutic Interventions/Progress Updates:    pt received in bed and agreeable to therapy. No complaint of pain. Bed mobility with supervision. Pt requested to use bathroom. ambulatory transfer to toilet with min A and RW. Continent B+B void, supervision hygiene, mod clothing management. Documented in flow sheet.   Pt ambulated to ortho gym with CGA and RW. Occ LOB noted while ambulating requiring min A, esp when pt is distracted.   Pt participated in dynamic balance and reaching task with narrow cones. Pt moved cones floor<>chest height table 2 x 16 cones with seated rest breaks between each bout. On last bout, placed half cones on their sides to require reach nearly all the way to floor. Pt then donned gloves with assist and wiped cones in standing with no UE support, CGA throughout. Pt then ambulated short in gym distances without RW to  put cones on shelf, ambulating without RW and manipulating objects on shelf with min A.   Pt used nustep at level 4, 3 x 5 min for global endurance and strengthening. Pt also expressed that the ROM on her LUE felt good and was improved after performance. Pt achieved up to 85 spm and was able to maintain for several minutes at a time. Largely maintained >60 spm for first 2 bouts and >40 for last  bout.   Pt returned to room after session with min A gait and returned to bed supervision. Pt was left with all needs in reach and alarm active.   Therapy Documentation Precautions:  Precautions Precautions:  Cervical Precaution Comments: ok for out of brace in bed, for showering, and for short trips to bathroom Required Braces or Orthoses: Cervical Brace Cervical Brace: Hard collar, Other (comment) Restrictions Weight Bearing Restrictions: No General:   Vital Signs: Therapy Vitals Temp: 98.2 F (36.8 C) Pulse Rate: 99 Resp: 16 BP: 123/69 Patient Position (if appropriate): Sitting Oxygen Therapy SpO2: 100 % O2 Device: Room Air Pain:   Mobility:   Locomotion :    Trunk/Postural Assessment :    Balance:   Exercises:   Other Treatments:      Therapy/Group: Individual Therapy  Mickel Fuchs 08/13/2022, 1:58 PM

## 2022-08-14 NOTE — Progress Notes (Signed)
Occupational Therapy Session Note  Patient Details  Name: Kristen Bautista MRN: [id number] Date of Birth: [birth year]  Today's Date: 08/14/2022 OT Individual Time: [id number] Individual Time Calculation (min): 59 min    Short Term Goals: Week 2:  OT Short Term Goal 1 (Week 2): Pt will thread LB clothing with min A OT Short Term Goal 2 (Week 2): Pt will perform toilet transfers with min A OT Short Term Goal 3 (Week 2): Pt will perfomr toileting tasks with mod A OT Short Term Goal 4 (Week 2): Pt will complete UB dressing tasks with mod A  Skilled Therapeutic Interventions/Progress Updates:  Pt greeted supine in bed, pt agreeable to OT intervention. Pt completed functional ambulation into bathroom with Rw and CGA. Pt completed 3/3 toileting tasks with supervision. Pt donned new brief with MAX A as pt reports she doesn't wear underwear at home. Pt completed grooming tasks at sink with supervision. Did change chin pad on cervical collar for cleanliness.   Pt completed functional ambulation to gym with RW and CGA. Pt completed various therapeutic activities to facilitate improved [place] in [place] and challenge dynamic standing balance. Pt able to ambulate around gym to collect wash cloths from floor level or below knee level to simulate IADL task of cleaning up around house. Pt completed task with unilateral support from RW with CGA. Pt able to stand to fold towels collected from floor level/below knee level with supervision with an emphasis on [facility] and bilateral integration. Graded task up and had pt pin weighted clothespins on towels with an emphasis on pincer grasp.   Pt additionally completed 2 other seated therapeutic activities with an emphasis on in hand manipulation skills such as rotation and translation. Pt completed functional mobility back towards room greater than a household distance with RW and CGA, pt did have 2 LOBs d/t pt losing focus and losing her balance posteriorly needing MIN A to  recover.   Ended session with pt seated in recliner with all needs within reach and safety belt activated.   Therapy Documentation Precautions:  Precautions Precautions: Cervical Precaution Comments: ok for out of brace in bed, for showering, and for short trips to bathroom Required Braces or Orthoses: Cervical Brace Cervical Brace: Hard collar, Other (comment) Restrictions Weight Bearing Restrictions: No    Pain: no pain reported     Therapy/Group: Individual Therapy  Corinne Ports [facility] 08/14/2022, 12:02 PM

## 2022-08-14 NOTE — Progress Notes (Signed)
Physical Therapy Session Note  Patient Details  Name: Kristen Bautista MRN: [id number] Date of Birth: 08/22/1955  Today's Date: 08/14/2022 PT Individual Time: 1135-1200, 1300-1345, 1500-1530 PT Individual Time Calculation (min): 25 min, 45 min, 30 min   Short Term Goals: Week 2:  PT Short Term Goal 1 (Week 2): Pt will ambulate with CGA consistently >200 ft PT Short Term Goal 2 (Week 2): Pt will perform STS with supervision PT Short Term Goal 3 (Week 2): Pt will initiate stair training  Skilled Therapeutic Interventions/Progress Updates:    Session 1: Pt received in recliner and agreeable to therapy.  No complaint of pain. Sit to stand with supervision throughout to RW. Pt ambulated <>ortho gym with CGA and RW, no noted LOB. Pt participated in cone taps without UE support for balance and LE coordination. Pt then participated in fwd/backward walking with CGA-min. Progressed to adding verbal commands for quick stops and direction changes with min A. Pt then performed Sit to stand x 20 for strength and endurance. Pt returned to room and was left with all needs in reach and alarm active.   Session 2: Pt received in recliner and agreeable to therapy.  No complaint of pain. Sit to stand with supervision throughout. Pt requesting to go outside. Session focused on gait training in community environments. Pt ambulated over unlevel surfaces, up/down ramps,  and through crowded atrium. Pt ambulated with CGA fading to supervision. Pt also noted to be picking up RW intermittently without LOB. Pt returned to room and was left with all needs in reach and alarm active.   Session 3: Pt recd as hand off from OT in gym. Pt was trialling rollator and demoes good safety. Pt ambulates back to atrium with CGA fading to supervision. Pt navigates gift shop and then outdoor, around hospital to [facility] entrance without a break. Pt endorses no fatigue. Pt educated on keeping rollator close by after abandoning it in gift shop,  expresses understanding. Pt then ambulates back to room and returns to recliner and was left with all needs in reach and alarm active.   Therapy Documentation Precautions:  Precautions Precautions: Cervical Precaution Comments: ok for out of brace in bed, for showering, and for short trips to bathroom Required Braces or Orthoses: Cervical Brace Cervical Brace: Hard collar, Other (comment) Restrictions Weight Bearing Restrictions: No General:       Therapy/Group: Individual Therapy  Mickel Fuchs 08/14/2022, 12:13 PM

## 2022-08-14 NOTE — Progress Notes (Signed)
PROGRESS NOTE   Subjective/Complaints:  Pt reports "having regular BM's all the time"- however just showed one yesterday.  Denies SOB, breathing issues or feeling ill at all.   Ate 50% of tray,  fingertips less tingly.    ROS:    Pt denies SOB, abd pain, CP, N/V/C/D, and vision changes   Except for HPI  Objective:   No results found. Recent Labs    08/13/22 0444  WBC 11.9*  HGB 9.5*  HCT 28.3*  PLT 415*    Recent Labs    08/13/22 0444  NA 136  K 4.2  CL 103  CO2 25  GLUCOSE 108*  BUN 23  CREATININE 0.80  CALCIUM 9.5     Intake/Output Summary (Last 24 hours) at 08/14/2022 0829 Last data filed at 08/14/2022 [id number] Gross per 24 hour  Intake 580 ml  Output 1400 ml  Net -820 ml        Physical Exam: Vital Signs Blood pressure 123/63, pulse 70, temperature 98.8 F (37.1 C), temperature source Oral, resp. rate 16, height '5\' 7"'$  (1.702 m), weight 55.7 kg, SpO2 99 %.     General: awake, alert, appropriate, sitting up in bed; ate 50% of tray; NAD HENT: conjugate gaze; oropharynx moist CV: regular rhythm and  rate; no JVD Pulmonary: CTA B/L; no W/R/R- good air movement GI: soft, NT, ND, (+)BS- normoactive Psychiatric: appropriate Neurological: Ox3  Skin: Clean and intact without signs of breakdown. Incision looks great- has new dressing on it- no drainage or erythema  PRIOR EXAM: Neuro:  Alert and oriented x 3. Fair insight and awareness. Functional Memory. Normal language and speech. Cranial nerve exam unremarkable. UE motor 3 prox to 4/5 distally with decreased LT at finger tips bilaterally. LE 3+ prox to 4/5 distally with decreased LT below ankle both feet.  decreased to light touch C5 downwards Musculoskeletal: neck tender even in collar with basic head movement    Assessment/Plan: 1. Functional deficits which require 3+ hours per day of interdisciplinary therapy in a comprehensive inpatient  rehab setting. Physiatrist is providing close team supervision and 24 hour management of active medical problems listed below. Physiatrist and rehab team continue to assess barriers to discharge/monitor patient progress toward functional and medical goals  Care Tool:  Bathing        Body parts bathed by helper: Right arm, Left arm, Chest, Abdomen, Front perineal area, Buttocks, Right upper leg, Left upper leg, Right lower leg, Left lower leg, Face     Bathing assist Assist Level: Total Assistance - Patient < 25%     Upper Body Dressing/Undressing Upper body dressing   What is the patient wearing?: Hospital gown only    Upper body assist Assist Level: Total Assistance - Patient < 25%    Lower Body Dressing/Undressing Lower body dressing      What is the patient wearing?: Underwear/pull up, Pants     Lower body assist Assist for lower body dressing: Total Assistance - Patient < 25%     Toileting Toileting    Toileting assist Assist for toileting: Moderate Assistance - Patient 50 - 74%     Transfers Chair/bed transfer  Transfers assist  Chair/bed transfer assist level: Moderate Assistance - Patient 50 - 74%     Locomotion Ambulation   Ambulation assist   Ambulation activity did not occur: Safety/medical concerns          Walk 10 feet activity   Assist  Walk 10 feet activity did not occur: Safety/medical concerns        Walk 50 feet activity   Assist Walk 50 feet with 2 turns activity did not occur: Safety/medical concerns         Walk 150 feet activity   Assist Walk 150 feet activity did not occur: Safety/medical concerns         Walk 10 feet on uneven surface  activity   Assist Walk 10 feet on uneven surfaces activity did not occur: Safety/medical concerns         Wheelchair     Assist Is the patient using a wheelchair?: Yes (Pt will use chair, but unable to get to chair during eval d/t pain) Type of Wheelchair:  Manual Wheelchair activity did not occur: Safety/medical concerns         Wheelchair 50 feet with 2 turns activity    Assist    Wheelchair 50 feet with 2 turns activity did not occur: Safety/medical concerns       Wheelchair 150 feet activity     Assist  Wheelchair 150 feet activity did not occur: Safety/medical concerns       Blood pressure 123/63, pulse 70, temperature 98.8 F (37.1 C), temperature source Oral, resp. rate 16, height '5\' 7"'$  (1.702 m), weight 55.7 kg, SpO2 99 %.  Medical Problem List and Plan: 1. Functional deficits secondary to severe cervical stenosis with myelopathy/epidural abscess.  Status post open posterior cervical lateral instrumentation posterior lateral fusion C3-4 4-5 C5-6 as well as laminotomies evacuation of epidural abscess C3-4 07/28/2022.  Cervical collar when out of bed.             -patient may not shower- due to PICC -ELOS/Goals: 13 to 17 days, modified independent and supervision with PT and OT             -D/c 08/23/22  Con't CIR PT and OT  2.  Antithrombotics: -DVT/anticoagulation:  Pharmaceutical: changed heparin to sq lovenox '40mg'$  daily to decrease sticks             -antiplatelet therapy: N/A 3. Pain Management: Oxycodone 5/'10mg'$  q3h PRN, Robaxin '500mg'$  q6h as needed  -is taking prior to therapies with good results.  -2/29- increased pain meds yesterday to 10-15 mg q 3 hours prn form 5-10 mg q3 hours prn- has substantially helped per pt.  3/4- will add Gabapentin 300 mg BID for nerve pain- monitor for side effects 3/5- Gabapentin already working for tingling pain- no side effects 4. Mood/Behavior/Sleep: Provide emotional support             -antipsychotic agents: N/A 5. Neuropsych/cognition: This patient is capable of making decisions on her own behalf. 6. Skin/Wound Care: Routine skin checks 7. Fluids/Electrolytes/Nutrition: encourage po. Weekly labs next 08/13/22 -added protein supplement. Continue vitamins -consulted RD  2/22 -can have food from home -begin megace trial '400mg'$  BID -2/28- Will check CMP next Monday -08/11/22 eating well this morning 3/4- Albumin up to 2.9 from 2.6 8.  ID.  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  Currently on vancomycin and ceftriaxone until 09/08/2022.    -2/27- last trough 13- per pharmacy 9.  Hypertension.  Norvasc 5 mg daily.     -  borderline 2/23--obsv today  3/5- BP controled- con't regimen Vitals:   08/11/22 0319 08/11/22 1440 08/11/22 1930 08/12/22 0427  BP: 126/70 124/71 120/64 137/67   08/12/22 1435 08/12/22 1922 08/13/22 0423 08/13/22 0804  BP: 117/69 125/67 (!) 126/54 122/83   08/13/22 1323 08/13/22 1929 08/14/22 0404 08/14/22 0757  BP: 123/69 123/62 (!) 111/58 123/63    10.  History of alcohol/tobacco/cannabis use.  Provide counseling, monitor for withdrawal 11.  Slow transit Constipation/neurogenic bowel: pt has flatus, intake minimal over last few days.  -encouraged po intake, can have food from home  -change colace to senna-s 2 tabs at bedtime             -continue daily miralax 17g, sorbitol 23m as needed -08/12/22 had 12 stools documented yesterday, but pt denies diarrhea-- all soft/formed, had one this morning. Cont regimen.  3/4- bowels working well finally 3/5- LBM yesterday 12.  Aortic atherosclerosis             -Consider resume aspirin and atorvastatin on discharge 13. ABLA   -2/26 stable at HGB 9.6, monitor on weekly labs next 08/13/22  3/4- stable- Hb 9.5 14. Thrombocytosis  -Likely reactive, no intervention at this time 15. Urinary incontinence -2/27- pt insistent to use Purewick- had one last night and said she doesn't want to get wet- since has difficulty holding her urine- will order Purewick for now- d/w nursing  -2/29- when pt needs to go, has urgency- let nursing know this AM  -08/11/22 no incontinence overnight, urinating well, monitor  3/4- will check U/A and Cx due to urgency and frequency since WBC up to 11.9  3/5- insists on keeping  purewick at night- refuses to let it be "taken away".  16. Leukocytosis  3/4- will check U/A and Cx-   3/5- U/A 9-) for UTI will recheck CBC with diff in AM to make sure still elevated- don't see source or cause.   I spent a total of 38   minutes on total care today- >50% coordination of care- due to d/w pt about leukocytosis also team conference to f/u on progress.    LOS: 13 days A FACE TO FACE EVALUATION WAS PERFORMED  Cliford Sequeira 08/14/2022, 8:29 AM

## 2022-08-14 NOTE — Progress Notes (Signed)
Patient ID: Kristen Bautista, female   DOB: 1956-05-15, 67 y.o.   MRN: [id number]  SW went by pt room to provide updates from team conference but pt not in room. SW will follow-up with pt.   1327- SW spoke with [name] to provide updates from team conference. SW informed on HHA in place.  No questions/concerns reported. SW informed will provide updates as available.   SW spoke with [name] to discuss above. He reports pt asked about a hospital bed and reacher. SW shared no recommendation of hospital bed but will ask team; he will purchase reacher since private pay. SW will order 3in1 BSC and RW. SW informed on HHA in place. Fam edu scheduled for Monday (3/11) 1pm-4pm.   1347-SW returned call and left message to [place] with [place] [facility] [phone] ext [phone]/f:[phone]) to inform current HHA is [facility] who could likely be making the request. SW encouraged follow-up if needed.   SW ordered 3in1 BSC and RW with Kristen Bautista/[employer]. Items will be delivered to pt room.   *therapist requests rollator versus rolling walker. SW informed vendor on change in DME item.   Loralee Pacas, MSW, [place] Office: [phone] Cell: [phone] Fax: [phone]

## 2022-08-14 NOTE — Patient Care Conference (Signed)
Inpatient RehabilitationTeam Conference and Plan of Care Update Date: 08/14/2022   Time: 11:15 AM    Patient Name: Kristen Bautista      Medical Record Number: [id number]  Date of Birth: 1955/07/30 Sex: Female         Room/Bed: 4M02C/4M02C-01 Payor Info: Payor: [place] / Plan: [place] / Product Type: *No Product type* /    Admit Date/Time:  08/01/2022  3:08 PM  Primary Diagnosis:  Cervical myelopathy [facility])  Hospital Problems: Principal Problem:   Cervical myelopathy ([place]) Active Problems:   Moderate malnutrition ([place])   History of substance use   Substance use disorder    Expected Discharge Date: Expected Discharge Date: 08/23/22  Team Members Present: Physician leading conference: Dr. Courtney Heys Social Worker Present: Loralee Pacas, [place] Nurse Present: Tacy Learn, RN PT Present: Ailene Rud, PT OT Present: Mariane Masters, OT PPS Coordinator present : Gunnar Fusi, SLP     Current Status/Progress Goal Weekly Team Focus  Bowel/Bladder   Incontinent of bladder and continent of Bowel, LBM 3/4   Patient regain continent of Bladder and Bowel   Assess toileting q shift and as needed    Swallow/Nutrition/ Hydration               ADL's   Mod A ADLs, CGA transfers, decreased BUE sensation; self feeding with modified utensils   min A overall   BADLs, transfers, educaiton, activity tolerance    Mobility   bed supervision, STS supervision, gait >100 ft with min-CGA   Supervision gait and transfers, CGA stairs  transfers, gait, stair training    Communication                Safety/Cognition/ Behavioral Observations               Pain   Rates pain 6 out 10; prn oxy 5-'10mg'$  q3hrs as needed, prn tylenol and Gabpentin '300mg'$  bid scheduled   pain < or +2   Assess pain q shift and as needed    Skin   Surgical incision posterion neck, aspen collar   Surgical incision remain clean, dry and intact and continues healing  Assess  skin q shift and as needed      Discharge Planning:  D/c to home with her cousin Kristen Bautista who will provide 24/7 care. Pt will switch to oral IV abx at time of discharge. HHA for HHPT/OT is [facility]. SW will confirm there are no barriers to discharge.   Team Discussion: Cervical Myelopathy. Continent B/B. Uses purwick. Pain managed with PRN medications. Incision to neck is intact without drainage. Wears cervical collar OOB. IV ABTS then switch to PO. Using built-ups for meals. Gabapentin for tingling is better.  Patient on target to meet rehab goals: yes, [place] ADLs. CGA transfers. Self feeding with modified utensils. Supervision bed mobility. STS supervision. Gait greater than 100' min/CGA.  *See Care Plan and progress notes for long and short-term goals.   Revisions to Treatment Plan:  Medication adjustments, monitor labs  Teaching Needs: Medications, safety, self care, gait/transfer training, skin care, etc.   Current Barriers to Discharge: Decreased caregiver support, Home enviroment access/layout, IV antibiotics, Wound care, Lack of/limited family support, Medication compliance, Behavior, and Nutritional means  Possible Resolutions to Barriers: Family education, nursing education, order recommended DME     Medical Summary Current Status: now having regular BM's continent with purewick at night- denies pain now- cervical collar out of bed- IV ABX- until date of d/c-  and will change to PO ABX-  Barriers to Discharge: Infection/IV Antibiotics;Neurogenic Bowel & Bladder;Medical stability;Self-care education;Weight bearing restrictions;Incontinence;Behavior/Mood  Barriers to Discharge Comments: pain better controlled- ADLs/OT- mod A and max A toileting-refuses to have purewick removed at night Possible Resolutions to [employer] Focus: sit-stand supervison- gait 115f CGA-occ LOB- has Supervison goals- d/c 3/14   Continued Need for Acute Rehabilitation Level of Care: The patient  requires daily medical management by a physician with specialized training in physical medicine and rehabilitation for the following reasons: Direction of a multidisciplinary physical rehabilitation program to maximize functional independence : Yes Medical management of patient stability for increased activity during participation in an intensive rehabilitation regime.: Yes Analysis of laboratory values and/or radiology reports with any subsequent need for medication adjustment and/or medical intervention. : Yes   I attest that I was present, lead the team conference, and concur with the assessment and plan of the team.   KErnest Pine3/10/2022, 2:32 PM

## 2022-08-15 LAB — VANCOMYCIN, TROUGH: Vancomycin Tr: 10 ug/mL — ABNORMAL LOW (ref 15–20)

## 2022-08-15 MED ORDER — OXYCODONE HCL 5 MG PO TABS: 10.0000 mg | ORAL_TABLET | ORAL | Status: DC | PRN

## 2022-08-15 MED ORDER — VANCOMYCIN HCL 750 MG/150ML IV SOLN
750.0000 mg | Freq: Two times a day (BID) | INTRAVENOUS | Status: DC
  Administered 2022-08-15 – 2022-08-23 (×16): 750 mg via INTRAVENOUS
  Filled 2022-08-15 (×19): qty 150

## 2022-08-15 MED ORDER — OXYCODONE HCL 5 MG PO TABS: 5.0000 mg | ORAL_TABLET | ORAL | Status: DC | PRN

## 2022-08-15 NOTE — Progress Notes (Signed)
PROGRESS NOTE   Subjective/Complaints:  Pt reports doing "great"- rarely taking pain meds- last time taken 9pm last night and no pain this AM.  Gabapentin doing "great" for nerve pain.  Slept well LBM yesterday  ROS:   Pt denies SOB, abd pain, CP, N/V/C/D, and vision changes   Except for HPI  Objective:   No results found. Recent Labs    08/13/22 0444  WBC 11.9*  HGB 9.5*  HCT 28.3*  PLT 415*    Recent Labs    08/13/22 0444  NA 136  K 4.2  CL 103  CO2 25  GLUCOSE 108*  BUN 23  CREATININE 0.80  CALCIUM 9.5     Intake/Output Summary (Last 24 hours) at 08/15/2022 0759 Last data filed at 08/15/2022 [id number] Gross per 24 hour  Intake 1278 ml  Output 1000 ml  Net 278 ml        Physical Exam: Vital Signs Blood pressure (!) 118/59, pulse 77, temperature 98.6 F (37 C), resp. rate 18, height '5\' 7"'$  (1.702 m), weight 55.7 kg, SpO2 99 %.      General: awake, alert, appropriate, sitting up in bed; eating breakfast- 50% done; NAD HENT: conjugate gaze; oropharynx moist CV: regular rate and rhythm; no JVD Pulmonary: CTA B/L; no W/R/R- good air movement GI: soft, NT, ND, (+)BS Psychiatric: appropriate- much brighter affect Neurological: Ox3  Skin: Clean and intact without signs of breakdown. Incision looks great- has new dressing on it- no drainage or erythema  PRIOR EXAM: Neuro:  Alert and oriented x 3. Fair insight and awareness. Functional Memory. Normal language and speech. Cranial nerve exam unremarkable. UE motor 3 prox to 4/5 distally with decreased LT at finger tips bilaterally. LE 3+ prox to 4/5 distally with decreased LT below ankle both feet.  decreased to light touch C5 downwards Musculoskeletal: neck tender even in collar with basic head movement    Assessment/Plan: 1. Functional deficits which require 3+ hours per day of interdisciplinary therapy in a comprehensive inpatient rehab  setting. Physiatrist is providing close team supervision and 24 hour management of active medical problems listed below. Physiatrist and rehab team continue to assess barriers to discharge/monitor patient progress toward functional and medical goals  Care Tool:  Bathing        Body parts bathed by helper: Right arm, Left arm, Chest, Abdomen, Front perineal area, Buttocks, Right upper leg, Left upper leg, Right lower leg, Left lower leg, Face     Bathing assist Assist Level: Total Assistance - Patient < 25%     Upper Body Dressing/Undressing Upper body dressing   What is the patient wearing?: Hospital gown only    Upper body assist Assist Level: Total Assistance - Patient < 25%    Lower Body Dressing/Undressing Lower body dressing      What is the patient wearing?: Underwear/pull up, Pants     Lower body assist Assist for lower body dressing: Total Assistance - Patient < 25%     Toileting Toileting    Toileting assist Assist for toileting: Moderate Assistance - Patient 50 - 74%     Transfers Chair/bed transfer  Transfers assist     Chair/bed  transfer assist level: Moderate Assistance - Patient 50 - 74%     Locomotion Ambulation   Ambulation assist   Ambulation activity did not occur: Safety/medical concerns          Walk 10 feet activity   Assist  Walk 10 feet activity did not occur: Safety/medical concerns        Walk 50 feet activity   Assist Walk 50 feet with 2 turns activity did not occur: Safety/medical concerns         Walk 150 feet activity   Assist Walk 150 feet activity did not occur: Safety/medical concerns         Walk 10 feet on uneven surface  activity   Assist Walk 10 feet on uneven surfaces activity did not occur: Safety/medical concerns         Wheelchair     Assist Is the patient using a wheelchair?: Yes (Pt will use chair, but unable to get to chair during eval d/t pain) Type of Wheelchair:  Manual Wheelchair activity did not occur: Safety/medical concerns         Wheelchair 50 feet with 2 turns activity    Assist    Wheelchair 50 feet with 2 turns activity did not occur: Safety/medical concerns       Wheelchair 150 feet activity     Assist  Wheelchair 150 feet activity did not occur: Safety/medical concerns       Blood pressure (!) 118/59, pulse 77, temperature 98.6 F (37 C), resp. rate 18, height '5\' 7"'$  (1.702 m), weight 55.7 kg, SpO2 99 %.  Medical Problem List and Plan: 1. Functional deficits secondary to severe cervical stenosis with myelopathy/epidural abscess.  Status post open posterior cervical lateral instrumentation posterior lateral fusion C3-4 4-5 C5-6 as well as laminotomies evacuation of epidural abscess C3-4 07/28/2022.  Cervical collar when out of bed.             -patient may not shower- due to PICC -ELOS/Goals: 13 to 17 days, modified independent and supervision with PT and OT             -D/c 08/23/22  Asked about tub transfer bench- explained- only comes with cost- pt educated  Con't PT and OT CIR 2.  Antithrombotics: -DVT/anticoagulation:  Pharmaceutical: changed heparin to sq lovenox '40mg'$  daily to decrease sticks             -antiplatelet therapy: N/A 3. Pain Management: Oxycodone 5/'10mg'$  q3h PRN, Robaxin '500mg'$  q6h as needed  -is taking prior to therapies with good results.  -2/29- increased pain meds yesterday to 10-15 mg q 3 hours prn form 5-10 mg q3 hours prn- has substantially helped per pt.  3/4- will add Gabapentin 300 mg BID for nerve pain- monitor for side effects 3/5- Gabapentin already working for tingling pain- no side effects 4. Mood/Behavior/Sleep: Provide emotional support             -antipsychotic agents: N/A 5. Neuropsych/cognition: This patient is capable of making decisions on her own behalf. 6. Skin/Wound Care: Routine skin checks 7. Fluids/Electrolytes/Nutrition: encourage po. Weekly labs next 08/13/22 -added  protein supplement. Continue vitamins -consulted RD 2/22 -can have food from home -begin megace trial '400mg'$  BID -2/28- Will check CMP next Monday -08/11/22 eating well this morning 3/4- Albumin up to 2.9 from 2.6 8.  ID.  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  Currently on vancomycin and ceftriaxone until 09/08/2022.    -2/27- last trough 13- per pharmacy 9.  Hypertension.  Norvasc 5 mg daily.     -borderline 2/23--obsv today  3/6- BP controlled- con't regimen Vitals:   08/11/22 1930 08/12/22 0427 08/12/22 1435 08/12/22 1922  BP: 120/64 137/67 117/69 125/67   08/13/22 0423 08/13/22 0804 08/13/22 1323 08/13/22 1929  BP: (!) 126/54 122/83 123/69 123/62   08/14/22 0404 08/14/22 0757 08/14/22 1927 08/15/22 0449  BP: (!) 111/58 123/63 (!) 117/58 (!) 118/59    10.  History of alcohol/tobacco/cannabis use.  Provide counseling, monitor for withdrawal 11.  Slow transit Constipation/neurogenic bowel: pt has flatus, intake minimal over last few days.  -encouraged po intake, can have food from home  -change colace to senna-s 2 tabs at bedtime             -continue daily miralax 17g, sorbitol 52m as needed -08/12/22 had 12 stools documented yesterday, but pt denies diarrhea-- all soft/formed, had one this morning. Cont regimen.  3/4- bowels working well finally 3/6- LBM yesterday- doing better 12.  Aortic atherosclerosis             -Consider resume aspirin and atorvastatin on discharge 13. ABLA   -2/26 stable at HGB 9.6, monitor on weekly labs next 08/13/22  3/4- stable- Hb 9.5 14. Thrombocytosis  -Likely reactive, no intervention at this time 15. Urinary incontinence -2/27- pt insistent to use Purewick- had one last night and said she doesn't want to get wet- since has difficulty holding her urine- will order Purewick for now- d/w nursing  -2/29- when pt needs to go, has urgency- let nursing know this AM  -08/11/22 no incontinence overnight, urinating well, monitor  3/4- will check U/A and Cx due  to urgency and frequency since WBC up to 11.9  3/5- insists on keeping purewick at night- refuses to let it be "taken away".  16. Leukocytosis  3/4- will check U/A and Cx-   3/6- will recheck labs in AM to follow up  LOS: 14 days A FACE TO [place]3/11/2022, 7:59 AM

## 2022-08-15 NOTE — Progress Notes (Signed)
Physical Therapy Session Note  Patient Details  Name: Kristen Bautista MRN: [id number] Date of Birth: February 19, 1956  Today's Date: 08/15/2022 PT Individual Time: 0915-1000 PT Individual Time Calculation (min): 45 min   Short Term Goals: Week 2:  PT Short Term Goal 1 (Week 2): Pt will ambulate with CGA consistently >200 ft PT Short Term Goal 2 (Week 2): Pt will perform STS with supervision PT Short Term Goal 3 (Week 2): Pt will initiate stair training  Skilled Therapeutic Interventions/Progress Updates:     Pt misses 15 minutes of skilled PT due to tornado drill and RN care. Pt received semi reclined in bed and agrees to therapy. Reports no pain. PT provides totalA to don aspen collar prior to mobility. Pt performs bed moiblity with bed features and without assistance. Pt dons socks while seated at EOB with setup assistance. Sit to stand with rollator and cues for initiation. Pt ambulates x150' to dayroom with cues for posture and increasing R stride length to decrease risk for falls. Seated rest break. Pt reporting that she has difficulty with R shoulder mobility. PT provides demonstration of upper extremity exercises to improve shoulder mobility. Pt completes x15 scapular retractions, shoulder circles backward and shoulders circles forward. Following, pt performs standing toe taps with orange cone to promote single legs stance and balance, as well as coordination. Pt does not like to be manually assisted, requesting to perform activity without assistance. Pt has difficulty completing without assistance, however, so PT provides CGA/minA to promote stability and facilitate lateral weight shifting. Cues also provided for upright posture. Pt completes 2x10. Pt ambulates back to room with rollator and same cues. Left seated in recliner with all needs within reach.   Therapy Documentation Precautions:  Precautions Precautions: Cervical Precaution Comments: ok for out of brace in bed, for showering, and for  short trips to bathroom Required Braces or Orthoses: Cervical Brace Cervical Brace: Hard collar, Other (comment) Restrictions Weight Bearing Restrictions: No   Therapy/Group: Individual Therapy  Breck Coons, PT, DPT 08/15/2022, 5:15 PM

## 2022-08-15 NOTE — Progress Notes (Addendum)
Patient ID: Kristen Bautista, female   DOB: [birth year], 67 y.o.   MRN: [id number]  SW met with pt in room to provide updates from team conference, and inform on HHA in place, and DME. DME- 3in1 BSC and RW delivered to room. SW informed will be swapping out RW for rollator. She is aware her cousin will be here for family edu on Monday. Pt requested handicap placard.  SW followed up with [employer] about swap RW with rollator.  Loralee Pacas, MSW, [place] Office: [phone] Cell: [phone] Fax: [phone]

## 2022-08-15 NOTE — Progress Notes (Signed)
Occupational Therapy Session Note  Patient Details  Name: Kristen Bautista MRN: [id number] Date of Birth: 1956/03/20  Today's Date: 08/15/2022 OT Individual Time: [id number] Individual Time Calculation (min): 59 min    Short Term Goals: Week 2:  OT Short Term Goal 1 (Week 2): Pt will thread LB clothing with min A OT Short Term Goal 2 (Week 2): Pt will perform toilet transfers with min A OT Short Term Goal 3 (Week 2): Pt will perfomr toileting tasks with mod A OT Short Term Goal 4 (Week 2): Pt will complete UB dressing tasks with mod A  Skilled Therapeutic Interventions/Progress Updates:  Pt greeted seated in recliner, pt agreeable to OT intervention. Pt completed ambulatory toilet transfer with rollator with supervision. Pt completed 3/3/ toileting tasks with supervision, pt ambulated to sink for hand hygiene with supervision.   Functional ambulation to gym with rollator and supervision. Session focused on various therapeutic activities focused on standing balance/tolerance, and [place]. Pt first instructed to use small shapes to duplicate pattern from visual aid provided with an emphasis on intrinsic strength and motor planning, pt completed task with supervision. Pt completed various card manipulation tasks to challenge in hand manipulation skills such as matching cards and using clothespins to pin cards to mirror with a focus on intrinsic strength.    Did review cervical precautions for home as pt is a "go getter" and wanted to lift weights at home, reinforced cervical precautions such as not lifting > 5 lbs.   Graded task up and had pt stand on compliant cushion while pt engaged in [facility] task of playing go fish, pt needed MIN A for balance support with no UE support. Pt reports feeling "wobbly" on cushion but with increased time pt accommodated well to change in terrain.   Pt completed functional ambulation back to room with rollator and CGA, pts rollator for home had arrived, therefore set up  rollator to match pts height with pt demonstrating ability to manage new rollator in hallway. Education provided on brake features and recommendation of backing rollator against wall when planning to sit on it for increased safety.              Ended session with pt seated on toilet and NT present.             Therapy Documentation Precautions:  Precautions Precautions: Cervical Precaution Comments: ok for out of brace in bed, for showering, and for short trips to bathroom Required Braces or Orthoses: Cervical Brace Cervical Brace: Hard collar, Other (comment) Restrictions Weight Bearing Restrictions: No  Pain: no pain     Therapy/Group: Individual Therapy  Corinne Ports [facility] 08/15/2022, 4:00 PM

## 2022-08-15 NOTE — Progress Notes (Signed)
Occupational Therapy Session Note  Patient Details  Name: Kristen Bautista MRN: [id number] Date of Birth: 1956-03-19  Today's Date: 08/15/2022 OT Individual Time: 1115-1200 OT Individual Time Calculation (min): 45 min    Short Term Goals: Week 1:  OT Short Term Goal 1 (Week 1): Pt will be able to sit EOB with min A in prep for UB dressing OT Short Term Goal 1 - Progress (Week 1): Met OT Short Term Goal 2 (Week 1): Pt will be able to come forward in prep for transfer with supervision OT Short Term Goal 2 - Progress (Week 1): Met OT Short Term Goal 3 (Week 1): Pt will transfer to toilet/BSC with mod A consistently OT Short Term Goal 3 - Progress (Week 1): Progressing toward goal OT Short Term Goal 4 (Week 1): Pt will thread LB clothing with min A Week 2:  OT Short Term Goal 1 (Week 2): Pt will thread LB clothing with min A OT Short Term Goal 2 (Week 2): Pt will perform toilet transfers with min A OT Short Term Goal 3 (Week 2): Pt will perfomr toileting tasks with mod A OT Short Term Goal 4 (Week 2): Pt will complete UB dressing tasks with mod A      Skilled Therapeutic Interventions/Progress Updates:    Pt received in recliner and agreeable to therapy.  She stated her hands are getting stronger but she is still struggling with impaired sensation in R finger tips.  Pt worked with yellow putty pinching R and then L fingers with tip pinch as if she was pinching a pie crust.   She then worked with egg shaped crayons making circles and filling in the circles. The egg shape helped her work on grasp strength and achieving a wider web space to enhance function for using a pen.  She practiced opening and closing 4 types of medication bottles and picking up small items to put into bottles without difficulty.   Ambulated to toilet with RW with close S and toileted with close S. Pt resting in recliner with all needs met and belt alarm on.   Therapy Documentation Precautions:  Precautions Precautions:  Cervical Precaution Comments: ok for out of brace in bed, for showering, and for short trips to bathroom Required Braces or Orthoses: Cervical Brace Cervical Brace: Hard collar, Other (comment) Restrictions Weight Bearing Restrictions: No  Pain: Pain Assessment Pain Score: 0-No pain ADL: ADL Eating: Moderate assistance Grooming: Maximal assistance Upper Body Bathing: Maximal assistance Lower Body Bathing: Maximal assistance Where Assessed-Lower Body Bathing: Chair Upper Body Dressing: Maximal assistance Where Assessed-Upper Body Dressing: Chair Lower Body Dressing: Maximal assistance Where Assessed-Lower Body Dressing: Chair Toileting: Dependent Where Assessed-Toileting: Bed level   Therapy/Group: Individual Therapy  [place] 08/15/2022, 1:41 PM

## 2022-08-15 NOTE — Progress Notes (Signed)
Pharmacy Antibiotic Note  Kristen Bautista is a 67 y.o. female admitted on 08/01/2022 with cervical epidural abscess.  Pharmacy has been consulted for vancomycin dosing.  Patient is also on Rocephin.  Last Scr (3/4) 0.8 mg/dL  Plan: Increase vanc '750mg'$  IV Q12H Continue Rocephin 2gm IV Q24H per MD Monitor renal fxn, clinical progress, weekly vanc levels Per ID, plan for 6 weeks of abx from OR on 07/28/22 (End date: 09/08/22) Vanco P/T w/ AUC recalc. 3/8 with AM dose  Height: '5\' 7"'$  (170.2 cm) Weight: 55.7 kg (122 lb 12.7 oz) IBW/kg (Calculated) : 61.6  Temp (24hrs), Avg:98.6 F (37 C), Min:98.6 F (37 C), Max:98.6 F (37 C)  Recent Labs  Lab 08/13/22 0444 08/15/22 0938  WBC 11.9*  --   CREATININE 0.80  --   VANCOTROUGH  --  10*     Estimated Creatinine Clearance: 60.8 mL/min (by C-G formula based on SCr of 0.8 mg/dL).    No Known Allergies  Antimicrobials: Vanc 2/16 >> 09/08/22 Zosyn 2/16 >>2/18 CTX 2/18 >> 09/08/22  Monitoring: 2/18 VP/VT 33/21 on '750mg'$  q12 >> decr '500mg'$  q12 2/21 VT 13 (estimated AUC 480) >> no change 2/28 VT 12 drawn ~ 12 hours >> no changes at this time 3/6 VT 10 drawn ~11.5 hours  >> change to 750 mg iv q12h  Microbiology Results: 2/16 MRSA PCR - negative 2/16 BCx - negative 2/17 cervical tissue - ngtd  Vaughan Basta BS, PharmD, BCPS Clinical Pharmacist 08/15/2022 2:40 PM  Contact: [phone] after 3 PM  "Be curious, not judgmental..." -Jamal Maes

## 2022-08-16 DIAGNOSIS — G959 Disease of spinal cord, unspecified: Secondary | ICD-10-CM | POA: Diagnosis not present

## 2022-08-16 LAB — CBC WITH DIFFERENTIAL/PLATELET
Abs Immature Granulocytes: 0.04 10*3/uL (ref 0.00–0.07)
Basophils Absolute: 0.1 10*3/uL (ref 0.0–0.1)
Basophils Relative: 1 %
Eosinophils Absolute: 0.1 10*3/uL (ref 0.0–0.5)
Eosinophils Relative: 1 %
HCT: 25.1 % — ABNORMAL LOW (ref 36.0–46.0)
Hemoglobin: 8.6 g/dL — ABNORMAL LOW (ref 12.0–15.0)
Immature Granulocytes: 0 %
Lymphocytes Relative: 25 %
Lymphs Abs: 2.5 10*3/uL (ref 0.7–4.0)
MCH: 29.7 pg (ref 26.0–34.0)
MCHC: 34.3 g/dL (ref 30.0–36.0)
MCV: 86.6 fL (ref 80.0–100.0)
Monocytes Absolute: 1.1 10*3/uL — ABNORMAL HIGH (ref 0.1–1.0)
Monocytes Relative: 11 %
Neutro Abs: 6.3 10*3/uL (ref 1.7–7.7)
Neutrophils Relative %: 62 %
Platelets: 383 10*3/uL (ref 150–400)
RBC: 2.9 MIL/uL — ABNORMAL LOW (ref 3.87–5.11)
RDW: 14.2 % (ref 11.5–15.5)
WBC: 10 10*3/uL (ref 4.0–10.5)
nRBC: 0 % (ref 0.0–0.2)

## 2022-08-16 LAB — BASIC METABOLIC PANEL
Anion gap: 8 (ref 5–15)
BUN: 25 mg/dL — ABNORMAL HIGH (ref 8–23)
CO2: 24 mmol/L (ref 22–32)
Calcium: 9 mg/dL (ref 8.9–10.3)
Chloride: 104 mmol/L (ref 98–111)
Creatinine, Ser: 0.95 mg/dL (ref 0.44–1.00)
GFR, Estimated: >60 mL/min (ref >60)
Glucose, Bld: 103 mg/dL — ABNORMAL HIGH (ref 70–99)
Potassium: 4.5 mmol/L (ref 3.5–5.1)
Sodium: 136 mmol/L (ref 135–145)

## 2022-08-16 NOTE — Progress Notes (Signed)
PROGRESS NOTE   Subjective/Complaints:  Pt reports pain doing MUCH better LBM yesterday  Has double breakfast- eating rapidly.     ROS:    Pt denies SOB, abd pain, CP, N/V/C/D, and vision changes   Except for HPI  Objective:   No results found. Recent Labs    08/16/22 0417  WBC 10.0  HGB 8.6*  HCT 25.1*  PLT 383    Recent Labs    08/16/22 0417  NA 136  K 4.5  CL 104  CO2 24  GLUCOSE 103*  BUN 25*  CREATININE 0.95  CALCIUM 9.0     Intake/Output Summary (Last 24 hours) at 08/16/2022 0845 Last data filed at 08/16/2022 0500 Gross per 24 hour  Intake 336 ml  Output 1300 ml  Net -964 ml        Physical Exam: Vital Signs Blood pressure 125/63, pulse 83, temperature 98.9 F (37.2 C), temperature source Oral, resp. rate 16, height '5\' 7"'$  (1.702 m), weight 55.7 kg, SpO2 100 %.       General: awake, alert, appropriate, sitting up in bed; eating; NAD HENT: conjugate gaze; oropharynx moist CV: regular rate; no JVD Pulmonary: CTA B/L; no W/R/R- good air movement GI: soft, NT, ND, (+)BS Psychiatric: appropriate- bright affect Neurological: Ox3  Skin: Clean and intact without signs of breakdown. Incision looks great- has new dressing on it- no drainage or erythema  PRIOR EXAM: Neuro:  Alert and oriented x 3. Fair insight and awareness. Functional Memory. Normal language and speech. Cranial nerve exam unremarkable. UE motor 3 prox to 4/5 distally with decreased LT at finger tips bilaterally. LE 3+ prox to 4/5 distally with decreased LT below ankle both feet.  decreased to light touch C5 downwards Musculoskeletal: neck tender even in collar with basic head movement    Assessment/Plan: 1. Functional deficits which require 3+ hours per day of interdisciplinary therapy in a comprehensive inpatient rehab setting. Physiatrist is providing close team supervision and 24 hour management of active medical  problems listed below. Physiatrist and rehab team continue to assess barriers to discharge/monitor patient progress toward functional and medical goals  Care Tool:  Bathing        Body parts bathed by helper: Right arm, Left arm, Chest, Abdomen, Front perineal area, Buttocks, Right upper leg, Left upper leg, Right lower leg, Left lower leg, Face     Bathing assist Assist Level: Total Assistance - Patient < 25%     Upper Body Dressing/Undressing Upper body dressing   What is the patient wearing?: Hospital gown only    Upper body assist Assist Level: Total Assistance - Patient < 25%    Lower Body Dressing/Undressing Lower body dressing      What is the patient wearing?: Underwear/pull up, Pants     Lower body assist Assist for lower body dressing: Total Assistance - Patient < 25%     Toileting Toileting    Toileting assist Assist for toileting: Moderate Assistance - Patient 50 - 74%     Transfers Chair/bed transfer  Transfers assist     Chair/bed transfer assist level: Moderate Assistance - Patient 50 - 74%     Locomotion Ambulation  Ambulation assist   Ambulation activity did not occur: Safety/medical concerns          Walk 10 feet activity   Assist  Walk 10 feet activity did not occur: Safety/medical concerns        Walk 50 feet activity   Assist Walk 50 feet with 2 turns activity did not occur: Safety/medical concerns         Walk 150 feet activity   Assist Walk 150 feet activity did not occur: Safety/medical concerns         Walk 10 feet on uneven surface  activity   Assist Walk 10 feet on uneven surfaces activity did not occur: Safety/medical concerns         Wheelchair     Assist Is the patient using a wheelchair?: Yes (Pt will use chair, but unable to get to chair during eval d/t pain) Type of Wheelchair: Manual Wheelchair activity did not occur: Safety/medical concerns         Wheelchair 50 feet with 2  turns activity    Assist    Wheelchair 50 feet with 2 turns activity did not occur: Safety/medical concerns       Wheelchair 150 feet activity     Assist  Wheelchair 150 feet activity did not occur: Safety/medical concerns       Blood pressure 125/63, pulse 83, temperature 98.9 F (37.2 C), temperature source Oral, resp. rate 16, height '5\' 7"'$  (1.702 m), weight 55.7 kg, SpO2 100 %.  Medical Problem List and Plan: 1. Functional deficits secondary to severe cervical stenosis with myelopathy/epidural abscess.  Status post open posterior cervical lateral instrumentation posterior lateral fusion C3-4 4-5 C5-6 as well as laminotomies evacuation of epidural abscess C3-4 07/28/2022.  Cervical collar when out of bed.             -patient may not shower- due to PICC -ELOS/Goals: 13 to 17 days, modified independent and supervision with PT and OT             -D/c 08/23/22  Asked about tub transfer bench- explained- only comes with cost- pt educated  Con't CIR PT and OT 2.  Antithrombotics: -DVT/anticoagulation:  Pharmaceutical: changed heparin to sq lovenox '40mg'$  daily to decrease sticks             -antiplatelet therapy: N/A 3. Pain Management: Oxycodone 5/'10mg'$  q3h PRN, Robaxin '500mg'$  q6h as needed  -is taking prior to therapies with good results.  -2/29- increased pain meds yesterday to 10-15 mg q 3 hours prn form 5-10 mg q3 hours prn- has substantially helped per pt.  3/4- will add Gabapentin 300 mg BID for nerve pain- monitor for side effects 3/5- Gabapentin already working for tingling pain- no side effects 3/7- pain doing MUCH better as well as nerve pain- con't gabapentin 4. Mood/Behavior/Sleep: Provide emotional support             -antipsychotic agents: N/A 5. Neuropsych/cognition: This patient is capable of making decisions on her own behalf. 6. Skin/Wound Care: Routine skin checks 7. Fluids/Electrolytes/Nutrition: encourage po. Weekly labs next 08/13/22 -added protein  supplement. Continue vitamins -consulted RD 2/22 -can have food from home -begin megace trial '400mg'$  BID -2/28- Will check CMP next Monday -08/11/22 eating well this morning 3/4- Albumin up to 2.9 from 2.6 8.  ID.  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  Currently on vancomycin and ceftriaxone until 09/08/2022.    -2/27- last trough 13- per pharmacy 9.  Hypertension.  Norvasc 5 mg  daily.     -borderline 2/23--obsv today  3/6- BP controlled- con't regimen Vitals:   08/12/22 1922 08/13/22 0423 08/13/22 0804 08/13/22 1323  BP: 125/67 (!) 126/54 122/83 123/69   08/13/22 1929 08/14/22 0404 08/14/22 0757 08/14/22 1927  BP: 123/62 (!) 111/58 123/63 (!) 117/58   08/15/22 0449 08/15/22 1700 08/15/22 1928 08/16/22 0622  BP: (!) 118/59 (!) 120/59 (!) 122/54 125/63    10.  History of alcohol/tobacco/cannabis use.  Provide counseling, monitor for withdrawal 11.  Slow transit Constipation/neurogenic bowel: pt has flatus, intake minimal over last few days.  -encouraged po intake, can have food from home  -change colace to senna-s 2 tabs at bedtime             -continue daily miralax 17g, sorbitol 60m as needed -08/12/22 had 12 stools documented yesterday, but pt denies diarrhea-- all soft/formed, had one this morning. Cont regimen.  3/4- bowels working well finally 3/6- LBM yesterday- doing better 3/7- LBM yesterday 12.  Aortic atherosclerosis             -Consider resume aspirin and atorvastatin on discharge 13. ABLA   -2/26 stable at HGB 9.6, monitor on weekly labs next 08/13/22  3/4- stable- Hb 9.5 14. Thrombocytosis  -Likely reactive, no intervention at this time 15. Urinary incontinence -2/27- pt insistent to use Purewick- had one last night and said she doesn't want to get wet- since has difficulty holding her urine- will order Purewick for now- d/w nursing  -2/29- when pt needs to go, has urgency- let nursing know this AM  -08/11/22 no incontinence overnight, urinating well, monitor  3/4- will  check U/A and Cx due to urgency and frequency since WBC up to 11.9  3/5- insists on keeping purewick at night- refuses to let it be "taken away".  16. Leukocytosis  3/4- will check U/A and Cx-   3/6- will recheck labs in AM to follow up  LOS: 15 days A FACE TO FACE EVALUATION WAS PERFORMED  Garvis Downum 08/16/2022, 8:45 AM

## 2022-08-16 NOTE — Progress Notes (Signed)
Physical Therapy Session Note  Patient Details  Name: Kristen Bautista MRN: [id number] Date of Birth: October 07, 1955  Today's Date: 08/16/2022 PT Individual Time: 0800-0900 PT Individual Time Calculation (min): 60 min   Short Term Goals: Week 2:  PT Short Term Goal 1 (Week 2): Pt will ambulate with CGA consistently >200 ft PT Short Term Goal 2 (Week 2): Pt will perform STS with supervision PT Short Term Goal 3 (Week 2): Pt will initiate stair training  Skilled Therapeutic Interventions/Progress Updates:    pt received in bed and agreeable to therapy. No complaint of pain. Pt performed supine>sit from flat bed with slight use of bed rail. Pt ambulated with rollator throughout session. Ongoing education about placing rollator against wall when planning to sit. Pt navigated 6" steps x12 with min A and 2 hand rails>1 bout of 4 with each single hand rail. Pt then participated in various tasks for navigating rollator in tight spaces, including weaving through cones and retrieving from various places around room. Pt then stood on airex pad without UE support with intermittent min A required, donned gloves and cleaned cones. While ambulating back to room, pt navigated ramp and mulch with CGA. Pt returned to room and sat in recliner, was left with all needs in reach and alarm active.   Therapy Documentation Precautions:  Precautions Precautions: Cervical Precaution Comments: ok for out of brace in bed, for showering, and for short trips to bathroom Required Braces or Orthoses: Cervical Brace Cervical Brace: Hard collar, Other (comment) Restrictions Weight Bearing Restrictions: No General:       Therapy/Group: Individual Therapy  Mickel Fuchs 08/16/2022, 12:31 PM

## 2022-08-16 NOTE — Progress Notes (Signed)
Nutrition Follow-up  DOCUMENTATION CODES:   Non-severe (moderate) malnutrition in context of chronic illness  INTERVENTION:  - Continue Ensure Max po q day, each supplement provides 150 kcal and 30 grams of protein.   - Continue Ensure Plus High Protein po BID, each supplement provides 350 kcal and 20 grams of protein.   NUTRITION DIAGNOSIS:   Moderate Malnutrition related to chronic illness as evidenced by energy intake < or equal to 75% for > or equal to 1 month, mild muscle depletion, mild fat depletion.  GOAL:   Patient will meet greater than or equal to 90% of their needs - Met with PO.   MONITOR:   PO intake, Supplement acceptance  REASON FOR ASSESSMENT:   Consult Poor PO  ASSESSMENT:   67 y.o. female admits to CIR related to functional deficits secondary to severe cervical stenosis with myelopathy/epidural abscess. PMH includes: alcohol abuse, anemia, aortic atherosclerosis, cannabis abuse, centrilobular emphysema, cervical myelopathy, Hep-C, HLD, HTN.  Meds reviewed: colace, megace, MVI, miralax, senokot. Labs reviewed.   The pt continues with good PO intakes. Per record, pt has been eating 60-100% of her meals over the past 7 days. Pt is meeting her needs at this time. RD will continue to monitor PO intakes.   Diet Order:   Diet Order             Diet regular Room service appropriate? Yes; Fluid consistency: Thin  Diet effective now                   EDUCATION NEEDS:   Not appropriate for education at this time  Skin:  Skin Assessment: Skin Integrity Issues: Skin Integrity Issues:: Incisions Incisions: Neck  Last BM:  3/5  Height:   Ht Readings from Last 1 Encounters:  08/01/22 '5\' 7"'$  (1.702 m)    Weight:   Wt Readings from Last 1 Encounters:  08/01/22 55.7 kg    Ideal Body Weight:     BMI:  Body mass index is 19.23 kg/m.  Estimated Nutritional Needs:   Kcal:  1670-1950 kcals  Protein:  80-100 gm  Fluid:  >/= 1.6 L  Thalia Bloodgood, RD, LDN, CNSC.

## 2022-08-16 NOTE — Progress Notes (Signed)
Occupational Therapy Session Note  Patient Details  Name: Kristen Bautista MRN: [id number] Date of Birth: [birth year]  Today's Date: 08/16/2022 OT Individual Time: 1400-1430 OT Individual Time Calculation (min): 30 min    Short Term Goals: Week 3:  OT Short Term Goal 1 (Week 3): STG=LTG 2/2 ELOS (continue working towards supervision LTG)  Skilled Therapeutic Interventions/Progress Updates:    OT intervention with focus on BUE FMC/GMC to increase independence with BADLs. Pt opened 12 medicine bottles, removed items varying in size from small beads to larger tokens, replaced each item back into bottles, and replaced caps. Pt primarily used RUE to complete all tasks with LUE for stabilizaiton/assist. Pt completed all tasks and was able to pick up all objects without errors. Pt pleased with progress. Pt remained seated in relciner with belt alarm activated. All needs within reach.   Therapy Documentation Precautions:  Precautions Precautions: Cervical Precaution Comments: ok for out of brace in bed, for showering, and for short trips to bathroom Required Braces or Orthoses: Cervical Brace Cervical Brace: Hard collar, Other (comment) Restrictions Weight Bearing Restrictions: No   Pain:  Pt denies pain this afternoon    Therapy/Group: Individual Therapy  [name] 08/16/2022, 2:33 PM

## 2022-08-16 NOTE — Progress Notes (Signed)
Occupational Therapy Session Note  Patient Details  Name: [name] MRN: [id number] Date of Birth: 04-29-1956  Today's Date: 08/16/2022 OT Individual Time: 0930-1040 OT Individual Time Calculation (min): 70 min    Short Term Goals: Week 3:  OT Short Term Goal 1 (Week 3): STG=LTG 2/2 ELOS (continue working towards supervision LTG)  Skilled Therapeutic Interventions/Progress Updates:    Pt amb with Rollator to gym with supervision. Pt completed table tasks with focus on hand strengthening. Pt placed clothes pins of all resistance on dowels using RUE for increased strengthening and coordination. Pt removed clothes pins and donned gloves. Pt cleaned clothes pins with [place]. Pt also placed small pegs into peg board with RUE. Pt showing improvement with RUE strength and coordination. Pt continues to report numbness and decreased sensation. Education ongoing. Pt returned to room and sat in relciner. Belt alarm activated. All needs within reach.   Therapy Documentation Precautions:  Precautions Precautions: Cervical Precaution Comments: ok for out of brace in bed, for showering, and for short trips to bathroom Required Braces or Orthoses: Cervical Brace Cervical Brace: Hard collar, Other (comment) Restrictions Weight Bearing Restrictions: No    Pain: Pain Assessment Pain Scale: 0-10 Pain Score: 0-No pain   Therapy/Group: Individual Therapy  [name] 08/16/2022, 10:43 AM

## 2022-08-16 NOTE — Progress Notes (Signed)
Occupational Therapy Weekly Progress Note  Patient Details  Name: Kristen Bautista MRN: [id number] Date of Birth: Aug 31, 1955  Beginning of progress report period: August 10, 2022 End of progress report period: August 16, 2022  Patient has met 4 of 4 short term goals.  Pt made steady progress with BADLs and functional transfers during the past week. Pt completes LB dressing tasks with mod A and UB dressing with min A. Min A for bathing tasks. Toileting with supervision. Funcitonal transfers with CGA using rollator. Min verbal cues for safety awareness.  Patient continues to demonstrate the following deficits: muscle weakness, decreased cardiorespiratoy endurance, impaired timing and sequencing, abnormal tone, unbalanced muscle activation, and decreased coordination, and decreased standing balance, decreased postural control, and decreased balance strategies and therefore will continue to benefit from skilled OT intervention to enhance overall performance with BADL and Reduce care partner burden.  Patient progressing toward long term goals..  Continue plan of care.  OT Short Term Goals Week 2:  OT Short Term Goal 1 (Week 2): Pt will thread LB clothing with min A OT Short Term Goal 2 (Week 2): Pt will perform toilet transfers with min A OT Short Term Goal 3 (Week 2): Pt will perfomr toileting tasks with mod A OT Short Term Goal 4 (Week 2): Pt will complete UB dressing tasks with mod A Week 3:  OT Short Term Goal 1 (Week 3): STG=LTG 2/2 ELOS (continue working towards supervision LTG)   [name] 08/16/2022, 6:50 AM

## 2022-08-16 NOTE — Progress Notes (Signed)
Physical Therapy Session Note  Patient Details  Name: Kristen Bautista MRN: [id number] Date of Birth: 15-Dec-1955  Today's Date: 08/16/2022 PT Individual Time: 1135-1200 PT Individual Time Calculation (min): 25 min   Short Term Goals: Week 2:  PT Short Term Goal 1 (Week 2): Pt will ambulate with CGA consistently >200 ft PT Short Term Goal 2 (Week 2): Pt will perform STS with supervision PT Short Term Goal 3 (Week 2): Pt will initiate stair training  Skilled Therapeutic Interventions/Progress Updates:     Pt received seated in recliner and agrees to therapy. No complaint of pain. Sit to stand with rollator with cues for safety. Pt ambulates x100' to gym with rollator and cues for navigation and avoiding obstacles in crowded environment. PT explains 6 minutes walk test to pt and pt agrees to perform. Pt stands and ambulates [phone]' with rollator, becoming distracted several times and stopping to look out windows, but not demonstrating significant fatigue. Pt stops test at 5:00 for toileting. Pt is continent of bowel and bladder and completes pericare without assistance. Pt ambulates x500' back to room. Left seated in recliner with all needs within reach.   Therapy Documentation Precautions:  Precautions Precautions: Cervical Precaution Comments: ok for out of brace in bed, for showering, and for short trips to bathroom Required Braces or Orthoses: Cervical Brace Cervical Brace: Hard collar, Other (comment) Restrictions Weight Bearing Restrictions: No   Therapy/Group: Individual Therapy  Breck Coons, PT, DPT 08/16/2022, 12:04 PM

## 2022-08-17 LAB — URINE CULTURE: Culture: 60000 — AB

## 2022-08-17 LAB — BASIC METABOLIC PANEL
Anion gap: 6 (ref 5–15)
BUN: 22 mg/dL (ref 8–23)
CO2: 26 mmol/L (ref 22–32)
Calcium: 8.9 mg/dL (ref 8.9–10.3)
Chloride: 104 mmol/L (ref 98–111)
Creatinine, Ser: 0.85 mg/dL (ref 0.44–1.00)
GFR, Estimated: >60 mL/min (ref >60)
Glucose, Bld: 83 mg/dL (ref 70–99)
Potassium: 4 mmol/L (ref 3.5–5.1)
Sodium: 136 mmol/L (ref 135–145)

## 2022-08-17 LAB — VANCOMYCIN, TROUGH
Vancomycin Tr: 37 ug/mL (ref 15–20)
Vancomycin Tr: 14 ug/mL — ABNORMAL LOW (ref 15–20)

## 2022-08-17 MED ORDER — BACLOFEN 5 MG HALF TABLET
5.0000 mg | ORAL_TABLET | Freq: Three times a day (TID) | ORAL | Status: DC
  Administered 2022-08-17 – 2022-08-23 (×19): 5 mg via ORAL
  Filled 2022-08-17 (×18): qty 1

## 2022-08-17 NOTE — Progress Notes (Signed)
Physical Therapy Weekly Progress Note  Patient Details  Name: CENIYA TAVERA MRN: [id number] Date of Birth: [birth year]  Beginning of progress report period: August 10, 2022 End of progress report period: August 17, 2022  Today's Date: 08/17/2022 PT Individual Time: 1015-1100, 1345-1430 PT Individual Time Calculation (min): 45 min, 45 min   Patient has met 3 of 3 short term goals.  Pt is progressing well toward LTGs. Pt is ambulating with RW and navigates stairs with min A-CGA. Pt has performed floor transfers safely with CGA. Family education scheduled for later in the week.   Patient continues to demonstrate the following deficits muscle weakness, decreased cardiorespiratoy endurance, impaired timing and sequencing, ataxia, and decreased coordination, and decreased standing balance, decreased balance strategies, and difficulty maintaining precautions and therefore will continue to benefit from skilled PT intervention to increase functional independence with mobility.  Patient progressing toward long term goals..  Plan of care revisions: updated to mod I overall d/t good progress.  PT Short Term Goals Week 1:  PT Short Term Goal 1 (Week 1): pt will sit EOB with CGA or better PT Short Term Goal 1 - Progress (Week 1): Met PT Short Term Goal 2 (Week 1): Pt will perform STS with mod A consistently PT Short Term Goal 2 - Progress (Week 1): Met PT Short Term Goal 3 (Week 1): Pt will tolerate sitting OOB between sessions for improved activity tolerance PT Short Term Goal 3 - Progress (Week 1): Met Week 2:  PT Short Term Goal 1 (Week 2): Pt will ambulate with CGA consistently >200 ft PT Short Term Goal 1 - Progress (Week 2): Met PT Short Term Goal 2 (Week 2): Pt will perform STS with supervision PT Short Term Goal 2 - Progress (Week 2): Met PT Short Term Goal 3 (Week 2): Pt will initiate stair training PT Short Term Goal 3 - Progress (Week 2): Met Week 3:  PT Short Term Goal 1 (Week 3): =LTGs d/t  ELOS  Skilled Therapeutic Interventions/Progress Updates:  Session 1:  Pt received in recliner and agreeable to therapy.  No complaint of pain. Pt ambulated with rollator and CGA-supervision throughout session. Pt participated calf stretch on wedge for improved ROM. Attempted to use cobble foam for balance training, but pt could not tolerate. Pt performed elevated lunge on red wedge for dynamic balance and quad strengthening. Pt demoes good use of stepping strategies to maintain balance. Pt then ambulated with 5lb ankle weights and 30lbs on rollator for increased work of gait. Noted improvements in gait pattern, likely d/t improved proprioception. While ambulating in [employer] [place] [employer] [address], pt spotted nickel on floor under corner of vending machine. Pt proceeds to safely take a half kneel position to get it. Then, pt saw another further under machine and proceeds to get all the way down on to belly before therapist can stop her. Pt successfully retrieves coin and required min A to complete floor transfer rollator for stability. Discussed safety for future sessions. Pt returned to room and remained in recliner, was left with all needs in reach and alarm active.   Session 2: Pt received in recliner and agreeable to therapy.  No complaint of pain. Pt ambulated with rollator with supervision throughout. Pt participated in formal floor transfer training and performed x 2, first to mat table then to rollator. Discussed safety and when to activate EMS. Pt able to perform with CGA. Pt reports frustration with being unable to use RUE to push up. Attempted tricep  strengthening with p/u blocks but difficulty d/t uneven strength. While transitioning into quadruped position, pt slipped on knees but was able to recover independently by safely rolling through sidelying. Pt also performed quadruped push ups to fatigue. Pt then ambulated to ortho gym and utilized nustep 3 x 3 min at level 6 for global strength and endurance. Pt  returned to bed after session with supervision. Pt was left with all needs in reach and alarm active.   Therapy Documentation Precautions:  Precautions Precautions: Cervical Precaution Comments: ok for out of brace in bed, for showering, and for short trips to bathroom Required Braces or Orthoses: Cervical Brace Cervical Brace: Hard collar, Other (comment) Restrictions Weight Bearing Restrictions: No General:     Therapy/Group: Individual Therapy  Mickel Fuchs 08/17/2022, 7:57 AM

## 2022-08-17 NOTE — Progress Notes (Signed)
PROGRESS NOTE   Subjective/Complaints:  Pt reports numbness/tingling not gone, but much better. Sleeping well LBM yesterday.  RUE- pulls- and is tight Really pulls/tight with ROM.    ROS:   Pt denies SOB, abd pain, CP, N/V/C/D, and vision changes  Except for HPI  Objective:   No results found. Recent Labs    08/16/22 0417  WBC 10.0  HGB 8.6*  HCT 25.1*  PLT 383    Recent Labs    08/16/22 0417  NA 136  K 4.5  CL 104  CO2 24  GLUCOSE 103*  BUN 25*  CREATININE 0.95  CALCIUM 9.0     Intake/Output Summary (Last 24 hours) at 08/17/2022 [id number] Last data filed at 08/17/2022 0520 Gross per 24 hour  Intake 1027 ml  Output 1300 ml  Net -273 ml        Physical Exam: Vital Signs Blood pressure 124/63, pulse 79, temperature 98.5 F (36.9 C), temperature source Oral, resp. rate 18, height '5\' 7"'$  (1.702 m), weight 55.7 kg, SpO2 100 %.        General: awake, alert, appropriate, sitting up in bed; eating breakfast; NAD HENT: conjugate gaze; oropharynx moist CV: regular rate and rhythm; no JVD Pulmonary: CTA B/L; no W/R/R- good air movement GI: soft, NT, ND, (+)BS- normoactive Psychiatric: appropriate- birghter affect Neurological: Ox3 MAS of 1 to 1+ in RUE- Hoffman's (+) Skin: Clean and intact without signs of breakdown. Incision looks great- has new dressing on it- no drainage or erythema  PRIOR EXAM: Neuro:  Alert and oriented x 3. Fair insight and awareness. Functional Memory. Normal language and speech. Cranial nerve exam unremarkable. UE motor 3 prox to 4/5 distally with decreased LT at finger tips bilaterally. LE 3+ prox to 4/5 distally with decreased LT below ankle both feet.  decreased to light touch C5 downwards Musculoskeletal: neck tender even in collar with basic head movement    Assessment/Plan: 1. Functional deficits which require 3+ hours per day of interdisciplinary therapy in a  comprehensive inpatient rehab setting. Physiatrist is providing close team supervision and 24 hour management of active medical problems listed below. Physiatrist and rehab team continue to assess barriers to discharge/monitor patient progress toward functional and medical goals  Care Tool:  Bathing        Body parts bathed by helper: Right arm, Left arm, Chest, Abdomen, Front perineal area, Buttocks, Right upper leg, Left upper leg, Right lower leg, Left lower leg, Face     Bathing assist Assist Level: Total Assistance - Patient < 25%     Upper Body Dressing/Undressing Upper body dressing   What is the patient wearing?: Hospital gown only    Upper body assist Assist Level: Total Assistance - Patient < 25%    Lower Body Dressing/Undressing Lower body dressing      What is the patient wearing?: Underwear/pull up, Pants     Lower body assist Assist for lower body dressing: Total Assistance - Patient < 25%     Toileting Toileting    Toileting assist Assist for toileting: Moderate Assistance - Patient 50 - 74%     Transfers Chair/bed transfer  Transfers assist  Chair/bed transfer assist level: Moderate Assistance - Patient 50 - 74%     Locomotion Ambulation   Ambulation assist   Ambulation activity did not occur: Safety/medical concerns          Walk 10 feet activity   Assist  Walk 10 feet activity did not occur: Safety/medical concerns        Walk 50 feet activity   Assist Walk 50 feet with 2 turns activity did not occur: Safety/medical concerns         Walk 150 feet activity   Assist Walk 150 feet activity did not occur: Safety/medical concerns         Walk 10 feet on uneven surface  activity   Assist Walk 10 feet on uneven surfaces activity did not occur: Safety/medical concerns         Wheelchair     Assist Is the patient using a wheelchair?: Yes (Pt will use chair, but unable to get to chair during eval d/t  pain) Type of Wheelchair: Manual Wheelchair activity did not occur: Safety/medical concerns         Wheelchair 50 feet with 2 turns activity    Assist    Wheelchair 50 feet with 2 turns activity did not occur: Safety/medical concerns       Wheelchair 150 feet activity     Assist  Wheelchair 150 feet activity did not occur: Safety/medical concerns       Blood pressure 124/63, pulse 79, temperature 98.5 F (36.9 C), temperature source Oral, resp. rate 18, height '5\' 7"'$  (1.702 m), weight 55.7 kg, SpO2 100 %.  Medical Problem List and Plan: 1. Functional deficits secondary to severe cervical stenosis with myelopathy/epidural abscess.  Status post open posterior cervical lateral instrumentation posterior lateral fusion C3-4 4-5 C5-6 as well as laminotomies evacuation of epidural abscess C3-4 07/28/2022.  Cervical collar when out of bed.             -patient may not shower- due to PICC -ELOS/Goals: 13 to 17 days, modified independent and supervision with PT and OT             -D/c 08/23/22  Asked about tub transfer bench- explained- only comes with cost- pt educated  Con't CIR PT and OT- developing spasticity- will start meds 2.  Antithrombotics: -DVT/anticoagulation:  Pharmaceutical: changed heparin to sq lovenox '40mg'$  daily to decrease sticks             -antiplatelet therapy: N/A 3. Pain Management: Oxycodone 5/'10mg'$  q3h PRN, Robaxin '500mg'$  q6h as needed  -is taking prior to therapies with good results.  -2/29- increased pain meds yesterday to 10-15 mg q 3 hours prn form 5-10 mg q3 hours prn- has substantially helped per pt.  3/4- will add Gabapentin 300 mg BID for nerve pain- monitor for side effects 3/5- Gabapentin already working for tingling pain- no side effects 3/7- pain doing MUCH better as well as nerve pain- con't gabapentin 4. Mood/Behavior/Sleep: Provide emotional support             -antipsychotic agents: N/A 5. Neuropsych/cognition: This patient is capable of  making decisions on her own behalf. 6. Skin/Wound Care: Routine skin checks 7. Fluids/Electrolytes/Nutrition: encourage po. Weekly labs next 08/13/22 -added protein supplement. Continue vitamins -consulted RD 2/22 -can have food from home -begin megace trial '400mg'$  BID -2/28- Will check CMP next Monday -08/11/22 eating well this morning 3/4- Albumin up to 2.9 from 2.6 8.  ID.  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  Currently on vancomycin and ceftriaxone until 09/08/2022.    -2/27- last trough 13- per pharmacy 9.  Hypertension.  Norvasc 5 mg daily.     -borderline 2/23--obsv today  3/8- BP controlled- con't regimen Vitals:   08/13/22 1323 08/13/22 1929 08/14/22 0404 08/14/22 0757  BP: 123/69 123/62 (!) 111/58 123/63   08/14/22 1927 08/15/22 0449 08/15/22 1700 08/15/22 1928  BP: (!) 117/58 (!) 118/59 (!) 120/59 (!) 122/54   08/16/22 0622 08/16/22 1504 08/16/22 1959 08/17/22 0430  BP: 125/63 124/69 109/64 124/63    10.  History of alcohol/tobacco/cannabis use.  Provide counseling, monitor for withdrawal 11.  Slow transit Constipation/neurogenic bowel: pt has flatus, intake minimal over last few days.  -encouraged po intake, can have food from home  -change colace to senna-s 2 tabs at bedtime             -continue daily miralax 17g, sorbitol 41m as needed -08/12/22 had 12 stools documented yesterday, but pt denies diarrhea-- all soft/formed, had one this morning. Cont regimen.  3/4- bowels working well finally 3/8- going every day right now 12.  Aortic atherosclerosis             -Consider resume aspirin and atorvastatin on discharge 13. ABLA   -2/26 stable at HGB 9.6, monitor on weekly labs next 08/13/22  3/4- stable- Hb 9.5 14. Thrombocytosis  -Likely reactive, no intervention at this time 15. Urinary incontinence -2/27- pt insistent to use Purewick- had one last night and said she doesn't want to get wet- since has difficulty holding her urine- will order Purewick for now- d/w  nursing  -2/29- when pt needs to go, has urgency- let nursing know this AM  -08/11/22 no incontinence overnight, urinating well, monitor  3/4- will check U/A and Cx due to urgency and frequency since WBC up to 11.9  3/5- insists on keeping purewick at night- refuses to let it be "taken away".  16. Leukocytosis  3/4- will check U/A and Cx-   3/6- will recheck labs in AM to follow up  3/8- WBC down to 10.0 17. Spasticity- mild  3/8- will start baclofen 5 mg TID- educated pt on Sx's and what it means   I spent a total of  39  minutes on total care today- >50% coordination of care- due to  D/w pt and education on spasticity- what it is, what it does, what this means and discussing options of treatment  LOS: 16 days A FACE TO FACE EVALUATION WAS PERFORMED  Arwilda [place] 08/17/2022, 8:07 AM

## 2022-08-17 NOTE — Progress Notes (Signed)
Pharmacy Antibiotic Note  Kristen Bautista is a 67 y.o. female admitted on 08/01/2022 with cervical epidural abscess.  Pharmacy has been consulted for vancomycin dosing.  Patient is also on Rocephin.  Trough was redrawn tonight and came at 8. This is very close to the the goal trough of 15-20 based on previous dosing with AUC. We will continue with the current dose.   Plan: Continue vanc '750mg'$  IV Q12H Continue Rocephin 2gm IV Q24H per MD Monitor renal fxn, clinical progress, weekly vanc levels Per ID, plan for 6 weeks of abx from OR on 07/28/22 (End date: 09/08/22)  Height: '5\' 7"'$  (170.2 cm) Weight: 55.7 kg (122 lb 12.7 oz) IBW/kg (Calculated) : 61.6  Temp (24hrs), Avg:98.7 F (37.1 C), Min:98.5 F (36.9 C), Max:99 F (37.2 C)  Recent Labs  Lab 08/13/22 0444 08/15/22 0938 08/16/22 0417 08/17/22 0934 08/17/22 1938  WBC 11.9*  --  10.0  --   --   CREATININE 0.80  --  0.95 0.85  --   VANCOTROUGH  --    < >  --  37* 14*   < > = values in this interval not displayed.     Estimated Creatinine Clearance: 57.2 mL/min (by C-G formula based on SCr of 0.85 mg/dL).    No Known Allergies  Antimicrobials: Vanc 2/16 >> 09/08/22 Zosyn 2/16 >>2/18 CTX 2/18 >> 09/08/22  Monitoring: 2/18 VP/VT 33/21 on '750mg'$  q12 >> decr '500mg'$  q12 2/21 VT 13 (estimated AUC 480) >> no change 2/28 VT 12 drawn ~ 12 hours >> no changes at this time 3/6 VT 10 drawn ~11.5 hours  >> change to 750 mg iv q12h 3/8 VT 14>>cont current dose  Microbiology Results: 2/16 MRSA PCR - negative 2/16 BCx - negative 2/17 cervical tissue - ngtd  Onnie Boer, PharmD, BCIDP, AAHIVP, CPP Infectious Disease Pharmacist 08/17/2022 8:49 PM

## 2022-08-17 NOTE — Progress Notes (Signed)
Pharmacy Antibiotic Note  Kristen Bautista is a 67 y.o. female admitted on 08/01/2022 with cervical epidural abscess.  Pharmacy has been consulted for vancomycin dosing.  Patient is also on Rocephin.  Last Scr (3/4) 0.85 mg/dL Vanco trough 37 mcg/mL. The patient's nurse called and stated the dose had been infusing for ~30 minutes prior to IV team drawing the vanco trough level  Plan: Continue vanc '750mg'$  IV Q12H Continue Rocephin 2gm IV Q24H per MD Monitor renal fxn, clinical progress, weekly vanc levels Per ID, plan for 6 weeks of abx from OR on 07/28/22 (End date: 09/08/22) Vanco T 3/8 with PM dose  Height: '5\' 7"'$  (170.2 cm) Weight: 55.7 kg (122 lb 12.7 oz) IBW/kg (Calculated) : 61.6  Temp (24hrs), Avg:98.7 F (37.1 C), Min:98.5 F (36.9 C), Max:98.8 F (37.1 C)  Recent Labs  Lab 08/13/22 0444 08/15/22 0938 08/16/22 0417 08/17/22 0934  WBC 11.9*  --  10.0  --   CREATININE 0.80  --  0.95 0.85  VANCOTROUGH  --  10*  --  37*     Estimated Creatinine Clearance: 57.2 mL/min (by C-G formula based on SCr of 0.85 mg/dL).    No Known Allergies  Antimicrobials: Vanc 2/16 >> 09/08/22 Zosyn 2/16 >>2/18 CTX 2/18 >> 09/08/22  Monitoring: 2/18 VP/VT 33/21 on '750mg'$  q12 >> decr '500mg'$  q12 2/21 VT 13 (estimated AUC 480) >> no change 2/28 VT 12 drawn ~ 12 hours >> no changes at this time 3/6 VT 10 drawn ~11.5 hours  >> change to 750 mg iv q12h  Microbiology Results: 2/16 MRSA PCR - negative 2/16 BCx - negative 2/17 cervical tissue - ngtd  Vaughan Basta BS, PharmD, BCPS Clinical Pharmacist 08/17/2022 12:06 PM  Contact: [phone] after 3 PM  "Be curious, not judgmental..." -Jamal Maes

## 2022-08-17 NOTE — Progress Notes (Signed)
Occupational Therapy Session Note  Patient Details  Name: MEL MALCOM MRN: [id number] Date of Birth: 22-Jun-1955  Today's Date: 08/17/2022 OT Individual Time: [id number] OT Individual Time Calculation (min): 75 min    Short Term Goals: Week 3:  OT Short Term Goal 1 (Week 3): STG=LTG 2/2 ELOS (continue working towards supervision LTG)  Skilled Therapeutic Interventions/Progress Updates:    Pt resting in bed upon arrival and agreeable to getting OOB for therapy. Pt requested to use bathroom. Amb with Rollator into bathroom with supervision. Toileting with supervision. Pt engaged in [facility] tasks with small pegs and peg board. Pt with ongoing BUE numbness but pt incorporating compensatory strategies to facilitate successful completion of task. Pt also removed small screws and bolts from activity board. Pt pleased with progress with BUE but frustrated bu ongoing numbness. Pt remained in recliner with all needs within reach. Belt alarm acativated.   Therapy Documentation Precautions:  Precautions Precautions: Cervical Precaution Comments: ok for out of brace in bed, for showering, and for short trips to bathroom Required Braces or Orthoses: Cervical Brace Cervical Brace: Hard collar, Other (comment) Restrictions Weight Bearing Restrictions: No   Pain:  Pt denies pain this morning   Therapy/Group: Individual Therapy  [name] 08/17/2022, 9:34 AM

## 2022-08-17 NOTE — Progress Notes (Signed)
Patient ID: Kristen Bautista, female   DOB: 1955/07/30, 67 y.o.   MRN: [id number]  SW went by pt room to see that rollator has been delivered. Vendor still needs to pick up RW.  SW informed [employer] on request to pick up RW.   Loralee Pacas, MSW, [place] Office: [phone] Cell: [phone] Fax: [phone]

## 2022-08-17 NOTE — Progress Notes (Signed)
Occupational Therapy Session Note  Patient Details  Name: Kristen Bautista MRN: [id number] Date of Birth: [birth year]  Today's Date: 08/17/2022 OT Individual Time: 1300-1330 OT Individual Time Calculation (min): 30 min    Short Term Goals: Week 1:  OT Short Term Goal 1 (Week 1): Pt will be able to sit EOB with min A in prep for UB dressing OT Short Term Goal 1 - Progress (Week 1): Met OT Short Term Goal 2 (Week 1): Pt will be able to come forward in prep for transfer with supervision OT Short Term Goal 2 - Progress (Week 1): Met OT Short Term Goal 3 (Week 1): Pt will transfer to toilet/BSC with mod A consistently OT Short Term Goal 3 - Progress (Week 1): Progressing toward goal OT Short Term Goal 4 (Week 1): Pt will thread LB clothing with min A  Skilled Therapeutic Interventions/Progress Updates:     Pt received in recliner no pain Therapeutic activity Focus of session on [facility]. Provided handout with >30 activities to improve FMC/dexterity with unilateral and bimanual tasks. Provided straws, coins and a cup for a "bank" with slit in the bottom. Discussed decreased sensaiton and use of vision to tell of pt gripping to hard crushing straw. Finger<>palm translation with S and frequent grip slip.   Pt left at end of session in bathroomwith NT coming in to finish supervising toileting after dropping off brief, call light in reach and all needs met   Therapy Documentation Precautions:  Precautions Precautions: Cervical Precaution Comments: ok for out of brace in bed, for showering, and for short trips to bathroom Required Braces or Orthoses: Cervical Brace Cervical Brace: Hard collar, Other (comment) Restrictions Weight Bearing Restrictions: No General:   Therapy/Group: Individual Therapy  Tonny Branch 08/17/2022, 6:55 AM

## 2022-08-18 DIAGNOSIS — K5901 Slow transit constipation: Secondary | ICD-10-CM | POA: Diagnosis not present

## 2022-08-18 DIAGNOSIS — G959 Disease of spinal cord, unspecified: Secondary | ICD-10-CM | POA: Diagnosis not present

## 2022-08-18 NOTE — Progress Notes (Signed)
PROGRESS NOTE   Subjective/Complaints: Doing well this morning.  Pt reports finger/toes paresthesias are definitely improving. Slept well last night. Had a good BM this morning. Urinating fine, denies any symptoms of UTI. Denies any other complaints or concerns today.    ROS:   Pt denies SOB, abd pain, CP, N/V/C/D, and vision changes  Except for HPI  Objective:   No results found. Recent Labs    08/16/22 0417  WBC 10.0  HGB 8.6*  HCT 25.1*  PLT 383     Recent Labs    08/16/22 0417 08/17/22 0934  NA 136 136  K 4.5 4.0  CL 104 104  CO2 24 26  GLUCOSE 103* 83  BUN 25* 22  CREATININE 0.95 0.85  CALCIUM 9.0 8.9      Intake/Output Summary (Last 24 hours) at 08/18/2022 0657 Last data filed at 08/18/2022 0415 Gross per 24 hour  Intake 1450 ml  Output 1500 ml  Net -50 ml         Physical Exam: Vital Signs Blood pressure 126/69, pulse 85, temperature 98.2 F (36.8 C), resp. rate 16, height '5\' 7"'$  (1.702 m), weight 55.7 kg, SpO2 100 %.   General: awake, alert, appropriate, sitting up in bed;  NAD HENT: conjugate gaze; oropharynx moist. Hard collar on.  CV: regular rate and rhythm; no JVD Pulmonary: CTA B/L; no W/R/R- good air movement GI: soft, NT, ND, (+)BS- normoactive Psychiatric: appropriate- brighter affect, very happy this morning Neurological: Ox3   PRIOR EXAM: Neuro:  Alert and oriented x 3. Fair insight and awareness. Functional Memory. Normal language and speech. Cranial nerve exam unremarkable. UE motor 3 prox to 4/5 distally with decreased LT at finger tips bilaterally. LE 3+ prox to 4/5 distally with decreased LT below ankle both feet.  decreased to light touch C5 downwards Musculoskeletal: neck tender even in collar with basic head movement  MAS of 1 to 1+ in RUE- Hoffman's (+) Skin: Clean and intact without signs of breakdown. Incision looks great- has new dressing on it- no drainage or  erythema  Assessment/Plan: 1. Functional deficits which require 3+ hours per day of interdisciplinary therapy in a comprehensive inpatient rehab setting. Physiatrist is providing close team supervision and 24 hour management of active medical problems listed below. Physiatrist and rehab team continue to assess barriers to discharge/monitor patient progress toward functional and medical goals  Care Tool:  Bathing        Body parts bathed by helper: Right arm, Left arm, Chest, Abdomen, Front perineal area, Buttocks, Right upper leg, Left upper leg, Right lower leg, Left lower leg, Face     Bathing assist Assist Level: Total Assistance - Patient < 25%     Upper Body Dressing/Undressing Upper body dressing   What is the patient wearing?: Hospital gown only    Upper body assist Assist Level: Total Assistance - Patient < 25%    Lower Body Dressing/Undressing Lower body dressing      What is the patient wearing?: Underwear/pull up, Pants     Lower body assist Assist for lower body dressing: Total Assistance - Patient < 25%     Toileting Toileting  Toileting assist Assist for toileting: Supervision/Verbal cueing     Transfers Chair/bed transfer  Transfers assist     Chair/bed transfer assist level: Moderate Assistance - Patient 50 - 74%     Locomotion Ambulation   Ambulation assist   Ambulation activity did not occur: Safety/medical concerns          Walk 10 feet activity   Assist  Walk 10 feet activity did not occur: Safety/medical concerns        Walk 50 feet activity   Assist Walk 50 feet with 2 turns activity did not occur: Safety/medical concerns         Walk 150 feet activity   Assist Walk 150 feet activity did not occur: Safety/medical concerns         Walk 10 feet on uneven surface  activity   Assist Walk 10 feet on uneven surfaces activity did not occur: Safety/medical concerns         Wheelchair     Assist Is  the patient using a wheelchair?: Yes (Pt will use chair, but unable to get to chair during eval d/t pain) Type of Wheelchair: Manual Wheelchair activity did not occur: Safety/medical concerns         Wheelchair 50 feet with 2 turns activity    Assist    Wheelchair 50 feet with 2 turns activity did not occur: Safety/medical concerns       Wheelchair 150 feet activity     Assist  Wheelchair 150 feet activity did not occur: Safety/medical concerns       Blood pressure 126/69, pulse 85, temperature 98.2 F (36.8 C), resp. rate 16, height '5\' 7"'$  (1.702 m), weight 55.7 kg, SpO2 100 %.  Medical Problem List and Plan: 1. Functional deficits secondary to severe cervical stenosis with myelopathy/epidural abscess.  Status post open posterior cervical lateral instrumentation posterior lateral fusion C3-4 4-5 C5-6 as well as laminotomies evacuation of epidural abscess C3-4 07/28/2022.  Cervical collar when out of bed.             -patient may not shower- due to PICC -ELOS/Goals: 13 to 17 days, modified independent and supervision with PT and OT             -D/c 08/23/22 -Asked about tub transfer bench- explained- only comes with cost- pt educated  -Con't CIR PT and OT- developing spasticity- cont Baclofen '5mg'$  TID 2.  Antithrombotics: -DVT/anticoagulation:  Pharmaceutical: changed heparin to sq lovenox '40mg'$  daily to decrease sticks             -antiplatelet therapy: N/A 3. Pain Management: Oxycodone 5/'10mg'$  q3h PRN, Robaxin '500mg'$  q6h as needed  -is taking prior to therapies with good results.  -2/29- increased pain meds yesterday to 10-15 mg q 3 hours prn form 5-10 mg q3 hours prn- has substantially helped per pt.  -3/4- will add Gabapentin 300 mg BID for nerve pain- monitor for side effects -3/5- Gabapentin already working for tingling pain- no side effects -3/7- pain doing MUCH better as well as nerve pain- con't gabapentin 4. Mood/Behavior/Sleep: Provide emotional support              -antipsychotic agents: N/A 5. Neuropsych/cognition: This patient is capable of making decisions on her own behalf. 6. Skin/Wound Care: Routine skin checks 7. Fluids/Electrolytes/Nutrition: encourage po. Weekly labs next 08/20/22 -added protein supplement. Continue vitamins -consulted RD 2/22 -can have food from home -begin megace trial '400mg'$  BID -08/11/22 eating well this morning -3/4- Albumin up to  2.9 from 2.6 8.  ID.  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  Currently on vancomycin and ceftriaxone until 09/08/2022.    -2/27- last trough 13- per pharmacy 9.  Hypertension.  Norvasc 5 mg daily.     -borderline 2/23--obsv today  -08/18/22- BP controlled- con't regimen Vitals:   08/14/22 0757 08/14/22 1927 08/15/22 0449 08/15/22 1700  BP: 123/63 (!) 117/58 (!) 118/59 (!) 120/59   08/15/22 1928 08/16/22 0622 08/16/22 1504 08/16/22 1959  BP: (!) 122/54 125/63 124/69 109/64   08/17/22 0430 08/17/22 1533 08/17/22 2011 08/18/22 0417  BP: 124/63 122/63 119/75 126/69    10.  History of alcohol/tobacco/cannabis use.  Provide counseling, monitor for withdrawal 11.  Slow transit Constipation/neurogenic bowel: pt has flatus, intake minimal over last few days.  -encouraged po intake, can have food from home  -change colace to senna-s 2 tabs at bedtime             -continue daily miralax 17g, sorbitol 40m as needed -08/12/22 had 12 stools documented yesterday, but pt denies diarrhea-- all soft/formed, had one this morning. Cont regimen.  -3/4- bowels working well finally -3/8- going every day right now -08/18/22 still having good BMs, cont regimen (colace '100mg'$  QD, miralax 17g QD, SenokotS 2 tabs QHS) 12.  Aortic atherosclerosis             -Consider resume aspirin and atorvastatin on discharge 13. ABLA   -2/26 stable at HGB 9.6, monitor on weekly labs next 08/20/22  -3/4- stable- Hb 9.5 14. Thrombocytosis  -Likely reactive, no intervention at this time 15. Urinary incontinence -2/27- pt insistent to  use Purewick- had one last night and said she doesn't want to get wet- since has difficulty holding her urine- will order Purewick for now- d/w nursing  -2/29- when pt needs to go, has urgency- let nursing know this AM  -08/11/22 no incontinence overnight, urinating well, monitor -3/4- will check U/A and Cx due to urgency and frequency since WBC up to 11.9  -3/5- insists on keeping purewick at night- refuses to let it be "taken away".  -08/18/22 U/A unremarkable but UCx with 60k CFU of VRE with essentially pan-resistance; pt without UTI symptoms, denies any frequency/urgency now, and WBCs normalized. Monitor for UTI symptoms but hold off on tx for now (pharmacist also messaged about this, and recommended same) 16. Leukocytosis  -3/4- will check U/A and Cx-   -3/6- will recheck labs in AM to follow up  -3/8- WBC down to 10.0, monitor on weekly labs next 08/20/22 17. Spasticity- mild  -3/8- will start baclofen 5 mg TID- educated pt on Sx's and what it means    LOS: 17 days A FACE TO FAlto3/02/2023, 6:57 AM

## 2022-08-19 NOTE — Progress Notes (Signed)
PROGRESS NOTE   Subjective/Complaints: Doing well this morning, again.  Pain doing better still. Slept well last night. LBM yesterday was soft. Urinating fine, still denies any symptoms of UTI. Denies any other complaints or concerns today.    ROS:   Pt denies SOB, abd pain, CP, N/V/C/D, urinary frequency/urgency, hematuria, dysuria, and vision changes  Except for HPI  Objective:   No results found. No results for input(s): "WBC", "HGB", "HCT", "PLT" in the last 72 hours.   Recent Labs    08/17/22 0934  NA 136  K 4.0  CL 104  CO2 26  GLUCOSE 83  BUN 22  CREATININE 0.85  CALCIUM 8.9     Intake/Output Summary (Last 24 hours) at 08/19/2022 1146 Last data filed at 08/19/2022 [id number] Gross per 24 hour  Intake 1320 ml  Output 1100 ml  Net 220 ml        Physical Exam: Vital Signs Blood pressure 129/67, pulse 88, temperature 99.6 F (37.6 C), temperature source Oral, resp. rate 18, height '5\' 7"'$  (1.702 m), weight 55.7 kg, SpO2 99 %.   General: awake, alert, appropriate, sitting up in bed;  NAD HENT: conjugate gaze; oropharynx moist. Hard collar on.  CV: regular rate and rhythm; no JVD Pulmonary: CTA B/L; no W/R/R- good air movement GI: soft, NT, ND, (+)BS- normoactive Psychiatric: appropriate- brighter affect, very happy this morning Neurological: Ox3   PRIOR EXAM: Neuro:  Alert and oriented x 3. Fair insight and awareness. Functional Memory. Normal language and speech. Cranial nerve exam unremarkable. UE motor 3 prox to 4/5 distally with decreased LT at finger tips bilaterally. LE 3+ prox to 4/5 distally with decreased LT below ankle both feet.  decreased to light touch C5 downwards Musculoskeletal: neck tender even in collar with basic head movement  MAS of 1 to 1+ in RUE- Hoffman's (+) Skin: Clean and intact without signs of breakdown. Incision looks great- has new dressing on it- no drainage or  erythema  Assessment/Plan: 1. Functional deficits which require 3+ hours per day of interdisciplinary therapy in a comprehensive inpatient rehab setting. Physiatrist is providing close team supervision and 24 hour management of active medical problems listed below. Physiatrist and rehab team continue to assess barriers to discharge/monitor patient progress toward functional and medical goals  Care Tool:  Bathing        Body parts bathed by helper: Right arm, Left arm, Chest, Abdomen, Front perineal area, Buttocks, Right upper leg, Left upper leg, Right lower leg, Left lower leg, Face     Bathing assist Assist Level: Total Assistance - Patient < 25%     Upper Body Dressing/Undressing Upper body dressing   What is the patient wearing?: Hospital gown only    Upper body assist Assist Level: Total Assistance - Patient < 25%    Lower Body Dressing/Undressing Lower body dressing      What is the patient wearing?: Underwear/pull up, Pants     Lower body assist Assist for lower body dressing: Total Assistance - Patient < 25%     Toileting Toileting    Toileting assist Assist for toileting: Supervision/Verbal cueing     Transfers Chair/bed transfer  Transfers  assist     Chair/bed transfer assist level: Moderate Assistance - Patient 50 - 74%     Locomotion Ambulation   Ambulation assist   Ambulation activity did not occur: Safety/medical concerns          Walk 10 feet activity   Assist  Walk 10 feet activity did not occur: Safety/medical concerns        Walk 50 feet activity   Assist Walk 50 feet with 2 turns activity did not occur: Safety/medical concerns         Walk 150 feet activity   Assist Walk 150 feet activity did not occur: Safety/medical concerns         Walk 10 feet on uneven surface  activity   Assist Walk 10 feet on uneven surfaces activity did not occur: Safety/medical concerns         Wheelchair     Assist Is  the patient using a wheelchair?: Yes (Pt will use chair, but unable to get to chair during eval d/t pain) Type of Wheelchair: Manual Wheelchair activity did not occur: Safety/medical concerns         Wheelchair 50 feet with 2 turns activity    Assist    Wheelchair 50 feet with 2 turns activity did not occur: Safety/medical concerns       Wheelchair 150 feet activity     Assist  Wheelchair 150 feet activity did not occur: Safety/medical concerns       Blood pressure 129/67, pulse 88, temperature 99.6 F (37.6 C), temperature source Oral, resp. rate 18, height '5\' 7"'$  (1.702 m), weight 55.7 kg, SpO2 99 %.  Medical Problem List and Plan: 1. Functional deficits secondary to severe cervical stenosis with myelopathy/epidural abscess.  Status post open posterior cervical lateral instrumentation posterior lateral fusion C3-4 4-5 C5-6 as well as laminotomies evacuation of epidural abscess C3-4 07/28/2022.  Cervical collar when out of bed.             -patient may not shower- due to PICC -ELOS/Goals: 13 to 17 days, modified independent and supervision with PT and OT             -D/c 08/23/22 -Asked about tub transfer bench- explained- only comes with cost- pt educated  -Con't CIR PT and OT- developing spasticity- cont Baclofen '5mg'$  TID 2.  Antithrombotics: -DVT/anticoagulation:  Pharmaceutical: changed heparin to sq lovenox '40mg'$  daily to decrease sticks             -antiplatelet therapy: N/A 3. Pain Management: Oxycodone 5/'10mg'$  q3h PRN, Robaxin '500mg'$  q6h as needed  -is taking prior to therapies with good results.  -2/29- increased pain meds yesterday to 10-15 mg q 3 hours prn form 5-10 mg q3 hours prn- has substantially helped per pt.  -3/4- will add Gabapentin 300 mg BID for nerve pain- monitor for side effects -3/5- Gabapentin already working for tingling pain- no side effects -3/7- pain doing MUCH better as well as nerve pain- con't gabapentin 4. Mood/Behavior/Sleep: Provide  emotional support             -antipsychotic agents: N/A 5. Neuropsych/cognition: This patient is capable of making decisions on her own behalf. 6. Skin/Wound Care: Routine skin checks 7. Fluids/Electrolytes/Nutrition: encourage po. Weekly labs next 08/20/22 -added protein supplement. Continue vitamins -consulted RD 2/22 -can have food from home -begin megace trial '400mg'$  BID -08/11/22 eating well this morning -3/4- Albumin up to 2.9 from 2.6 8.  ID.  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  Currently on vancomycin and ceftriaxone until 09/08/2022.    -2/27- last trough 13- per pharmacy 9.  Hypertension.  Norvasc 5 mg daily.     -borderline 2/23--obsv today  -08/19/22- BP controlled- con't regimen Vitals:   08/15/22 1700 08/15/22 1928 08/16/22 0622 08/16/22 1504  BP: (!) 120/59 (!) 122/54 125/63 124/69   08/16/22 1959 08/17/22 0430 08/17/22 1533 08/17/22 2011  BP: 109/64 124/63 122/63 119/75   08/18/22 0417 08/18/22 1328 08/18/22 2100 08/19/22 0417  BP: 126/69 125/71 137/68 129/67    10.  History of alcohol/tobacco/cannabis use.  Provide counseling, monitor for withdrawal 11.  Slow transit Constipation/neurogenic bowel: pt has flatus, intake minimal over last few days.  -encouraged po intake, can have food from home  -change colace to senna-s 2 tabs at bedtime             -continue daily miralax 17g, sorbitol 4m as needed -08/12/22 had 12 stools documented yesterday, but pt denies diarrhea-- all soft/formed, had one this morning. Cont regimen.  -3/4- bowels working well finally -3/8- going every day right now -08/19/22 still having good BMs, cont regimen (colace '100mg'$  QD, miralax 17g QD, SenokotS 2 tabs QHS) 12.  Aortic atherosclerosis             -Consider resume aspirin and atorvastatin on discharge 13. ABLA   -2/26 stable at HGB 9.6, monitor on weekly labs next 08/20/22  -3/4- stable- Hb 9.5 14. Thrombocytosis  -Likely reactive, no intervention at this time 15. Urinary  incontinence -2/27- pt insistent to use Purewick- had one last night and said she doesn't want to get wet- since has difficulty holding her urine- will order Purewick for now- d/w nursing  -2/29- when pt needs to go, has urgency- let nursing know this AM  -08/11/22 no incontinence overnight, urinating well, monitor -3/4- will check U/A and Cx due to urgency and frequency since WBC up to 11.9  -3/5- insists on keeping purewick at night- refuses to let it be "taken away".  -08/18/22 U/A unremarkable but UCx with 60k CFU of VRE with essentially pan-resistance; pt without UTI symptoms, denies any frequency/urgency now, and WBCs normalized. Monitor for UTI symptoms but hold off on tx for now (pharmacist also messaged about this, and recommended same) -08/19/22 still not UTI symptoms, cont to monitor 16. Leukocytosis  -3/4- will check U/A and Cx-   -3/6- will recheck labs in AM to follow up  -3/8- WBC down to 10.0, monitor on weekly labs next 08/20/22 17. Spasticity- mild  -3/8- will start baclofen 5 mg TID- educated pt on Sx's and what it means    LOS: 18 days A FACE TO FWillacy3/03/2023, 11:46 AM
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
## 2022-08-20 LAB — COMPREHENSIVE METABOLIC PANEL
ALT: 27 U/L (ref 0–44)
AST: 29 U/L (ref 15–41)
Albumin: 2.8 g/dL — ABNORMAL LOW (ref 3.5–5.0)
Alkaline Phosphatase: 43 U/L (ref 38–126)
Anion gap: 10 (ref 5–15)
BUN: 22 mg/dL (ref 8–23)
CO2: 25 mmol/L (ref 22–32)
Calcium: 9.3 mg/dL (ref 8.9–10.3)
Chloride: 103 mmol/L (ref 98–111)
Creatinine, Ser: 0.89 mg/dL (ref 0.44–1.00)
GFR, Estimated: >60 mL/min (ref >60)
Glucose, Bld: 102 mg/dL — ABNORMAL HIGH (ref 70–99)
Potassium: 4.7 mmol/L (ref 3.5–5.1)
Sodium: 138 mmol/L (ref 135–145)
Total Bilirubin: 0.5 mg/dL (ref 0.3–1.2)
Total Protein: 6.7 g/dL (ref 6.5–8.1)

## 2022-08-20 LAB — CBC
HCT: 28.3 % — ABNORMAL LOW (ref 36.0–46.0)
Hemoglobin: 9.8 g/dL — ABNORMAL LOW (ref 12.0–15.0)
MCH: 29.7 pg (ref 26.0–34.0)
MCHC: 34.6 g/dL (ref 30.0–36.0)
MCV: 85.8 fL (ref 80.0–100.0)
Platelets: 405 10*3/uL — ABNORMAL HIGH (ref 150–400)
RBC: 3.3 MIL/uL — ABNORMAL LOW (ref 3.87–5.11)
RDW: 15.1 % (ref 11.5–15.5)
WBC: 11.2 10*3/uL — ABNORMAL HIGH (ref 4.0–10.5)
nRBC: 0 % (ref 0.0–0.2)

## 2022-08-20 NOTE — Progress Notes (Signed)
Occupational Therapy Session Note  Patient Details  Name: PAIZLIE KAUFFMANN MRN: [id number] Date of Birth: [birth year]  Today's Date: 08/20/2022 OT Individual Time: 1300-1340 OT Individual Time Calculation (min): 40 min    Short Term Goals: Week 3:  OT Short Term Goal 1 (Week 3): STG=LTG 2/2 ELOS (continue working towards supervision LTG)  Skilled Therapeutic Interventions/Progress Updates:    Pt resting in recliner upon arrival with cousin, [name], present for education. OT intervention with focus on amb with Rollator, furniture transfers, bed mobility, TTB tranfsers, energy conservation, safety awareness, and educaiton. Pt amb with Rollator with supervision. Furniture transfers, bed transfers, and TTB transfers with supervision. Pt's cousin to acquire TTB for pt to use when she starts taking showers. Reviewed cervical precautions. Pt and cousin verbalized understanding of all recommendations and precautions. Pt remained in recliner with cousin present. All needs within reach.   Therapy Documentation Precautions:  Precautions Precautions: Cervical Precaution Comments: ok for out of brace in bed, for showering, and for short trips to bathroom Required Braces or Orthoses: Cervical Brace Cervical Brace: Hard collar, Other (comment) Restrictions Weight Bearing Restrictions: No   Pain:  Pt denies pain this afternoon     Therapy/Group: Individual Therapy  [name] 08/20/2022, 1:45 PM

## 2022-08-20 NOTE — Progress Notes (Signed)
Patient ID: Kristen Bautista, female   DOB: 12-Jul-1955, 67 y.o.   MRN: [id number]  SW received updates about pt has questions about handicap placard and foodstamps.   Handicap placard will be provided before discharge.   SW informed pt she will need to contact DSS after she discharges to inquire about emergency foodstamps.   Loralee Pacas, MSW, [place] Office: [phone] Cell: [phone] Fax: [phone]

## 2022-08-20 NOTE — Progress Notes (Signed)
Occupational Therapy Session Note  Patient Details  Name: MATTISON FIGURSKI MRN: [id number] Date of Birth: 07/16/1955  Today's Date: 08/20/2022 OT Individual Time: [id number] Individual Time Calculation (min): 73 min    Short Term Goals: Week 3:  OT Short Term Goal 1 (Week 3): STG=LTG 2/2 ELOS (continue working towards supervision LTG)  Skilled Therapeutic Interventions/Progress Updates:    OT intervention with focus on functional amb with RW, BUE strengthening with functional tasks, standing balance, safety awareness, and activity tolerance to increase independence with BADLs. Pt amb with Rollator at supervision level throughout session. Pt amb to gym and engaged in seated tasks with Squigz, using RUE to place and remove on elevated surface. Pt also donned gloves and cleaned all items with Sani wipes. Pt amb to Rebounder and tossed soccer ball against renounder 4x20. No LOB noted. Pt returned to room and requested to use toilet. Toileting with supervisoin. Pt returned to recliner. All needs within reach.   Therapy Documentation Precautions:  Precautions Precautions: Cervical Precaution Comments: ok for out of brace in bed, for showering, and for short trips to bathroom Required Braces or Orthoses: Cervical Brace Cervical Brace: Hard collar, Other (comment) Restrictions Weight Bearing Restrictions: No Pain: Pt c/o ongoing Bil hand/finger numbness, R>L   Therapy/Group: Individual Therapy  [name] 08/20/2022, 9:35 AM

## 2022-08-20 NOTE — Progress Notes (Signed)
PROGRESS NOTE   Subjective/Complaints:  Pt reports no pain; stiffness also better Pain "0/10"  Wondering why was on IV Vanc- and if can be stopped.  Practicing moving her RUE since so weak.    ROS:    Pt denies SOB, abd pain, CP, N/V/C/D, and vision changes  Except for HPI  Objective:   No results found. Recent Labs    08/20/22 0437  WBC 11.2*  HGB 9.8*  HCT 28.3*  PLT 405*     Recent Labs    08/17/22 0934 08/20/22 0437  NA 136 138  K 4.0 4.7  CL 104 103  CO2 26 25  GLUCOSE 83 102*  BUN 22 22  CREATININE 0.85 0.89  CALCIUM 8.9 9.3     Intake/Output Summary (Last 24 hours) at 08/20/2022 0831 Last data filed at 08/20/2022 0530 Gross per 24 hour  Intake 720 ml  Output 3250 ml  Net -2530 ml        Physical Exam: Vital Signs Blood pressure 132/76, pulse 80, temperature 98.2 F (36.8 C), temperature source Oral, resp. rate 18, height '5\' 7"'$  (1.702 m), weight 55.7 kg, SpO2 100 %.     General: awake, alert, appropriate, sitting up in bed; humming to herself;  NAD HENT: conjugate gaze; oropharynx moist CV: regular rate and rhythm; no JVD Pulmonary: CTA B/L; no W/R/R- good air movement GI: soft, NT, ND, (+)BS Psychiatric: appropriate- bright affect Neurological: Ox3 Less spasticity noted since starting Baclofen  PRIOR EXAM: Neuro:  Alert and oriented x 3. Fair insight and awareness. Functional Memory. Normal language and speech. Cranial nerve exam unremarkable. UE motor 3 prox to 4/5 distally with decreased LT at finger tips bilaterally. LE 3+ prox to 4/5 distally with decreased LT below ankle both feet.  decreased to light touch C5 downwards Musculoskeletal: neck tender even in collar with basic head movement  MAS of 1 to 1+ in RUE- Hoffman's (+) Skin: Clean and intact without signs of breakdown. Incision looks great- has new dressing on it- no drainage or erythema  Assessment/Plan: 1.  Functional deficits which require 3+ hours per day of interdisciplinary therapy in a comprehensive inpatient rehab setting. Physiatrist is providing close team supervision and 24 hour management of active medical problems listed below. Physiatrist and rehab team continue to assess barriers to discharge/monitor patient progress toward functional and medical goals  Care Tool:  Bathing        Body parts bathed by helper: Right arm, Left arm, Chest, Abdomen, Front perineal area, Buttocks, Right upper leg, Left upper leg, Right lower leg, Left lower leg, Face     Bathing assist Assist Level: Total Assistance - Patient < 25%     Upper Body Dressing/Undressing Upper body dressing   What is the patient wearing?: Hospital gown only    Upper body assist Assist Level: Total Assistance - Patient < 25%    Lower Body Dressing/Undressing Lower body dressing      What is the patient wearing?: Underwear/pull up, Pants     Lower body assist Assist for lower body dressing: Total Assistance - Patient < 25%     Toileting Toileting    Toileting assist Assist  for toileting: Supervision/Verbal cueing     Transfers Chair/bed transfer  Transfers assist     Chair/bed transfer assist level: Moderate Assistance - Patient 50 - 74%     Locomotion Ambulation   Ambulation assist   Ambulation activity did not occur: Safety/medical concerns          Walk 10 feet activity   Assist  Walk 10 feet activity did not occur: Safety/medical concerns        Walk 50 feet activity   Assist Walk 50 feet with 2 turns activity did not occur: Safety/medical concerns         Walk 150 feet activity   Assist Walk 150 feet activity did not occur: Safety/medical concerns         Walk 10 feet on uneven surface  activity   Assist Walk 10 feet on uneven surfaces activity did not occur: Safety/medical concerns         Wheelchair     Assist Is the patient using a wheelchair?:  Yes (Pt will use chair, but unable to get to chair during eval d/t pain) Type of Wheelchair: Manual Wheelchair activity did not occur: Safety/medical concerns         Wheelchair 50 feet with 2 turns activity    Assist    Wheelchair 50 feet with 2 turns activity did not occur: Safety/medical concerns       Wheelchair 150 feet activity     Assist  Wheelchair 150 feet activity did not occur: Safety/medical concerns       Blood pressure 132/76, pulse 80, temperature 98.2 F (36.8 C), temperature source Oral, resp. rate 18, height '5\' 7"'$  (1.702 m), weight 55.7 kg, SpO2 100 %.  Medical Problem List and Plan: 1. Functional deficits secondary to severe cervical stenosis with myelopathy/epidural abscess.  Status post open posterior cervical lateral instrumentation posterior lateral fusion C3-4 4-5 C5-6 as well as laminotomies evacuation of epidural abscess C3-4 07/28/2022.  Cervical collar when out of bed.             -patient may not shower- due to PICC -ELOS/Goals: 13 to 17 days, modified independent and supervision with PT and OT             -D/c 08/23/22 Pt asking for handicapped placard and about "$ at home"- asked her to contact SW about applying for food stamps Con't CIR PT and OT 2.  Antithrombotics: -DVT/anticoagulation:  Pharmaceutical: changed heparin to sq lovenox '40mg'$  daily to decrease sticks             -antiplatelet therapy: N/A 3. Pain Management: Oxycodone 5/'10mg'$  q3h PRN, Robaxin '500mg'$  q6h as needed  -is taking prior to therapies with good results.  -2/29- increased pain meds yesterday to 10-15 mg q 3 hours prn form 5-10 mg q3 hours prn- has substantially helped per pt.  -3/4- will add Gabapentin 300 mg BID for nerve pain- monitor for side effects -3/5- Gabapentin already working for tingling pain- no side effects -3/7- pain doing MUCH better as well as nerve pain- con't gabapentin 3/11- no pain 0/10 4. Mood/Behavior/Sleep: Provide emotional support              -antipsychotic agents: N/A 5. Neuropsych/cognition: This patient is capable of making decisions on her own behalf. 6. Skin/Wound Care: Routine skin checks 7. Fluids/Electrolytes/Nutrition: encourage po. Weekly labs next 08/20/22 -added protein supplement. Continue vitamins -consulted RD 2/22 -can have food from home -begin megace trial '400mg'$  BID -08/11/22 eating well this  morning -3/4- Albumin up to 2.9 from 2.6 8.  ID.  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  Currently on vancomycin and ceftriaxone until 09/08/2022.    -2/27- last trough 13- per pharmacy  3/11- will stop IV ABX on Thursday and switch to PO ABX per ID 9.  Hypertension.  Norvasc 5 mg daily.     -borderline 2/23--obsv today  3/11- BP controlled con't regimen Vitals:   08/16/22 1504 08/16/22 1959 08/17/22 0430 08/17/22 1533  BP: 124/69 109/64 124/63 122/63   08/17/22 2011 08/18/22 0417 08/18/22 1328 08/18/22 2100  BP: 119/75 126/69 125/71 137/68   08/19/22 0417 08/19/22 1429 08/19/22 1935 08/20/22 0548  BP: 129/67 124/70 132/67 132/76    10.  History of alcohol/tobacco/cannabis use.  Provide counseling, monitor for withdrawal 11.  Slow transit Constipation/neurogenic bowel: pt has flatus, intake minimal over last few days.  -encouraged po intake, can have food from home  -change colace to senna-s 2 tabs at bedtime             -continue daily miralax 17g, sorbitol 74m as needed -08/12/22 had 12 stools documented yesterday, but pt denies diarrhea-- all soft/formed, had one this morning. Cont regimen.  -3/4- bowels working well finally -3/8- going every day right now -08/19/22 still having good BMs, cont regimen (colace '100mg'$  QD, miralax 17g QD, SenokotS 2 tabs QHS) 12.  Aortic atherosclerosis             -Consider resume aspirin and atorvastatin on discharge 13. ABLA   -2/26 stable at HGB 9.6, monitor on weekly labs next 08/20/22  -3/4- stable- Hb 9.5  3/11- Hb 9.8 14. Thrombocytosis  -Likely reactive, no intervention at  this time 15. Urinary incontinence -2/27- pt insistent to use Purewick- had one last night and said she doesn't want to get wet- since has difficulty holding her urine- will order Purewick for now- d/w nursing  -2/29- when pt needs to go, has urgency- let nursing know this AM  -08/11/22 no incontinence overnight, urinating well, monitor -3/4- will check U/A and Cx due to urgency and frequency since WBC up to 11.9  -3/5- insists on keeping purewick at night- refuses to let it be "taken away".  -08/18/22 U/A unremarkable but UCx with 60k CFU of VRE with essentially pan-resistance; pt without UTI symptoms, denies any frequency/urgency now, and WBCs normalized. Monitor for UTI symptoms but hold off on tx for now (pharmacist also messaged about this, and recommended same) -08/19/22 still not UTI symptoms, cont to monitor 3/11- not on ABX for UTI 16. Leukocytosis  -3/4- will check U/A and Cx-   -3/6- will recheck labs in AM to follow up  -3/8- WBC down to 10.0, monitor on weekly labs next 08/20/22 17. Spasticity- mild  -3/8- will start baclofen 5 mg TID- educated pt on Sx's and what it means  3/11- Sx's better  I spent a total of 37   minutes on total care today- >50% coordination of care- due to  Discussing pain and SW issues and handicapped placard- and IV ABX- explained will stop on Thursday-    LOS: 19 days A FACE TO FACE EVALUATION WAS PERFORMED  Ady Heimann 08/20/2022, 8:31 AM

## 2022-08-20 NOTE — Progress Notes (Signed)
Physical Therapy Session Note  Patient Details  Name: Kristen Bautista MRN: [id number] Date of Birth: 1955-11-02  Today's Date: 08/20/2022 PT Individual Time: 1100-1130, 1445-1530 PT Individual Time Calculation (min): 30 min, 45 min   Short Term Goals: Week 2:  PT Short Term Goal 1 (Week 2): Pt will ambulate with CGA consistently >200 ft PT Short Term Goal 1 - Progress (Week 2): Met PT Short Term Goal 2 (Week 2): Pt will perform STS with supervision PT Short Term Goal 2 - Progress (Week 2): Met PT Short Term Goal 3 (Week 2): Pt will initiate stair training PT Short Term Goal 3 - Progress (Week 2): Met  Skilled Therapeutic Interventions/Progress Updates:    Session 1: Pt received in recliner and agreeable to therapy.  No complaint of pain. ambulatory transfer to bathroom with rollator for toileting. Supervision for 3/3 toileting tasks. Pt then ambulated >250 ft with rollator and supervision. Pt participated in standing kinetron at 20 cm/sec and 90 cm/sec for dynamic balance and BLE strength. Progressed to Sit to stand on kinetron plates set at 90 cm/sec for dynamic balance challenge. Pt ambulated back to room and was left with all needs in reach and alarm active.   Session 2: Pt received in recliner and agreeable to therapy.  Session focused on family education with pt's cousin, [name]. Pt used rollator throughout session at [place] level. Pt used bathroom for continent bladder void with supervision. Pt ambulated >600 ft during session with rollator. Pt performed car transfer with set up and therapist provided education on folding rollator. Pt navigated stairs with 2 handrails, 1 hand rail, and with HHA. Pt then participated in floor transfer to mat table and to rollator with CGA. Pt returned to room and to recliner, was left with all needs in reach and alarm active. [name] demoed appropriate guarding and assist where required.   Therapy Documentation Precautions:   Precautions Precautions: Cervical Precaution Comments: ok for out of brace in bed, for showering, and for short trips to bathroom Required Braces or Orthoses: Cervical Brace Cervical Brace: Hard collar, Other (comment) Restrictions Weight Bearing Restrictions: No General:       Therapy/Group: Individual Therapy  Mickel Fuchs 08/20/2022, 12:37 PM

## 2022-08-21 NOTE — Progress Notes (Signed)
Physical Therapy Discharge Summary  Patient Details  Name: Kristen Bautista MRN: [id number] Date of Birth: 1956-03-02  Date of Discharge from PT service:August 22, 2022  {CHL IP REHAB PT TIME CALCULATION:[id number]}   Patient has met {NUMBERS 0-12:18577} of {NUMBERS 0-12:18577} long term goals due to improved activity tolerance, improved balance, improved postural control, increased strength, decreased pain, and ability to compensate for deficits.  Patient to discharge at an ambulatory level Modified Independent.   Patient's care partner is independent to provide the necessary physical assistance at discharge. Pt is to d/c home with her cousin, Kristen Bautista, who has undergone hands on family training.   Reasons goals not met: NA  Recommendation:  Patient will benefit from ongoing skilled PT services in outpatient setting to continue to advance safe functional mobility, address ongoing impairments in balance, strength, fine motor control, and minimize fall risk.  Equipment: rollator  Reasons for discharge: treatment goals met and discharge from hospital  Patient/family agrees with progress made and goals achieved: Yes  PT Discharge Precautions/Restrictions Precautions Precautions: Cervical Precaution Comments: ok for out of brace in bed, for showering, and for short trips to bathroom Required Braces or Orthoses: Cervical Brace Cervical Brace: Hard collar;Other (comment) Restrictions Weight Bearing Restrictions: No Vital Signs   Pain Pain Assessment Pain Score: Asleep Pain Interference Pain Interference Pain Effect on Sleep: 1. Rarely or not at all Pain Interference with Therapy Activities: 1. Rarely or not at all Pain Interference with Day-to-Day Activities: 1. Rarely or not at all Vision/Perception  Vision - History Ability to See in Adequate Light: 0 Adequate Perception Perception: Impaired Praxis Praxis: Intact  Cognition Overall Cognitive Status: Within Functional Limits  for tasks assessed Arousal/Alertness: Awake/alert Orientation Level: Oriented X4 Memory: Appears intact Safety/Judgment: Appears intact Sensation Sensation Light Touch: Impaired Detail Peripheral sensation comments: reports paresthesia, but improved from baseline Light Touch Impaired Details: Impaired LLE;Impaired RLE;Impaired RUE;Impaired LUE Proprioception: Impaired Detail Proprioception Impaired Details: Impaired RUE;Impaired LUE;Impaired RLE;Impaired LLE Coordination Gross Motor Movements are Fluid and Coordinated: No Coordination and Movement Description: Mild LE dyscoordination, limited by UE coordination Motor  Motor Motor: Tetraplegia;Abnormal postural alignment and control;Abnormal tone Motor - Skilled Clinical Observations: extensor tone on eval Motor - Discharge Observations: extensor tone largely resolved, spasticity being treated medically  Mobility Bed Mobility Bed Mobility: Supine to Sit;Sit to Supine Supine to Sit: Independent with assistive device Sit to Supine: Independent with assistive device Transfers Transfers: Sit to Stand;Stand to Sit;Stand Pivot Transfers Sit to Stand: Independent with assistive device Stand to Sit: Independent with assistive device Stand Pivot Transfers: Independent with assistive device Transfer (Assistive device): Rolling walker Locomotion  Gait Ambulation: Yes Gait Assistance: Independent with assistive device Gait Distance (Feet): 1000 Feet Assistive device: Rollator Gait Gait: Yes Gait Pattern: Within Functional Limits Stairs / Additional Locomotion Stairs: Yes Stairs Assistance: Supervision/Verbal cueing Stair Management Technique: Step to pattern;Two rails Number of Stairs: 12 Height of Stairs: 6 Ramp: Independent with assistive device Curb: Contact Guard/Touching assist  Trunk/Postural Assessment  Cervical Assessment Cervical Assessment: Exceptions to [facility] Thoracic Assessment Thoracic Assessment: Within Functional  Limits Lumbar Assessment Lumbar Assessment: Within Functional Limits Postural Control Postural Control: Deficits on evaluation Trunk Control: greatly improved from eval, near normal  Balance Balance Balance Assessed: Yes Static Sitting Balance Static Sitting - Balance Support: Feet supported Static Sitting - Level of Assistance: 6: Modified independent (Device/Increase time) Dynamic Sitting Balance Dynamic Sitting - Balance Support: During functional activity Dynamic Sitting - Level of Assistance: 6: Modified independent (Device/Increase time) Static  Standing Balance Static Standing - Balance Support: During functional activity Static Standing - Level of Assistance: 6: Modified independent (Device/Increase time) Dynamic Standing Balance Dynamic Standing - Balance Support: During functional activity Dynamic Standing - Level of Assistance: 6: Modified independent (Device/Increase time) Extremity Assessment      RLE Assessment RLE Assessment: Exceptions to [facility] General Strength Comments: Grossly 4/5 LLE Assessment LLE Assessment: Exceptions to [facility] General Strength Comments: Grossly 4/5   Kristen Bautista C Kristen Bautista 08/21/2022, 12:34 PM

## 2022-08-21 NOTE — Progress Notes (Signed)
Occupational Therapy Session Note  Patient Details  Name: Kristen Bautista MRN: [id number] Date of Birth: [birth year]  Today's Date: 08/21/2022 OT Individual Time: 1030-1120 OT Individual Time Calculation (min): 50 min   Short Term Goals: Week 3:  OT Short Term Goal 1 (Week 3): STG=LTG 2/2 ELOS (continue working towards supervision LTG)  Skilled Therapeutic Interventions/Progress Updates:  Pt received sitting in recliner for additional skilled OT session with focus on UE strengthening/ROM. Pt agreeable to interventions, demonstrating overall pleasant mood. Pt with no reports of pain. OT offering intermediate rest breaks and positioning suggestions throughout session to potential address pain/fatigue and maximize participation/safety in session.  Pt participates in series of exercises, including:  -Overhead holds -Chest level holds -Diagonal holds -Bicep curls -Chest press  Pt completes 3 sets/8-10 reps with 10 sec holds of each exercises using 2# dowel bar. Pt requires multimodal cuing for correct form, safe pacing, and importance of rest breaks.  Pt issued HEP for thera-putty hand strengthening exercises and tan theraputty for grading of throughout exercises.   Pt remained sitting in recliner with all immediate needs met at end of session. Pt continues to be appropriate for skilled OT intervention to promote further functional independence.   Therapy Documentation Precautions:  Precautions Precautions: Cervical Precaution Comments: ok for out of brace in bed, for showering, and for short trips to bathroom Required Braces or Orthoses: Cervical Brace Cervical Brace: Hard collar, Other (comment) Restrictions Weight Bearing Restrictions: (P) No  Therapy/Group: Individual Therapy  Maudie Mercury, OTR/L, MSOT  08/21/2022, 10:46 AM

## 2022-08-21 NOTE — Patient Care Conference (Signed)
Inpatient RehabilitationTeam Conference and Plan of Care Update Date: 08/21/2022   Time:11:00 AM    Patient Name: Kristen Bautista      Medical Record Number: [id number]  Date of Birth: Jul 30, 1955 Sex: Female         Room/Bed: 4M02C/4M02C-01 Payor Info: Payor: [place] / Plan: [place] / Product Type: *No Product type* /    Admit Date/Time:  08/01/2022  3:08 PM  Primary Diagnosis:  Cervical myelopathy [facility])  Hospital Problems: Principal Problem:   Cervical myelopathy ([place]) Active Problems:   Moderate malnutrition ([place])   History of substance use   Substance use disorder    Expected Discharge Date: Expected Discharge Date: 08/23/22  Team Members Present: Physician leading conference: Dr. Courtney Heys Social Worker Present: Loralee Pacas, [place] Nurse Present: Tacy Learn, RN PT Present: Ailene Rud, PT OT Present: Willeen Cass, OT;Roanna Epley, COTA PPS Coordinator present : Gunnar Fusi, SLP     Current Status/Progress Goal Weekly Team Focus  Bowel/Bladder   Patient is continent of Bowel and Incontinent at times of Urine,   Regain continence of bladder   Assess Toileting needs Q2HR, bladder training, with time tolieting ,PRN    Swallow/Nutrition/ Hydration               ADL's   LB dressing-min A; toileting-CGA/supervision; UB dressing-min A; self feeding-supervision; decreased safety awareness   supervision overall   BADLs, transfers, education, safety awareness    Mobility   Supervision-mod I transfers and gait up to 1000 ft with rollator, stairs with supervision, floor transfers with CGA   Supervision gait and transfers, CGA stairs  transfers, gait, stair training    Communication                Safety/Cognition/ Behavioral Observations               Pain   Pain to neck and back , continue schedule and prn medications   pain rating < 3   Assess pain QS/PRN enourage patient to take medication as needed    Skin   s/p  Surgical neck incision healing well, no signs of infectio, assess QS/PRN,,Aspen collar in place   Surgical incision area healing well, without drainage or signs of infection  Assess skin and surgical incision QS/PRN, report changes      Discharge Planning:  D/c to home with her cousin Kristen Bautista who will provide 24/7 care. Pt will switch to oral IV abx at time of discharge. HHA for HHPT/OT is [facility]. Fam edu completed on 3/11 1pm-4pm with pt cousin. SW will confirm there are no barriers to discharge.   Team Discussion: Cervical myelopathy. Continent B/B; uses [facility] [employer] [facility]. Pain well controlled with scheduled and PRN medications. Incision to neck healing without drainage. IV ABT and will be switched to oral at discharge. On contact VRE in urine.  Patient on target to meet rehab goals: yes, gait 1000' with rollater stairs with supervision. Family education completed.   *See Care Plan and progress notes for long and short-term goals.   Revisions to Treatment Plan:  Medication adjustments, monitor labs  Teaching Needs: Medications, safety, self care, gait/transfer training, skin care, etc,   Current Barriers to Discharge: Decreased caregiver support, Medication compliance, and Behavior  Possible Resolutions to Barriers: Family education completed, nursing education, order recommended DME if needed      Medical Summary Current Status: on contact preacutions for VRE in urine- pain negigible- family educaiton done yesterday- H/H  PT and OT  Barriers to Discharge: Behavior/Mood;Infection/IV Antibiotics;Spasticity;Self-care education;Weight bearing restrictions  Barriers to Discharge Comments: on contact precautions- on target for goals of supervision- largely at goal level of PT- another day for OT- Possible Resolutions to [employer] Focus: will switch to outpt PT and OT- unless no transport- cannot let her leave early- due to IV ABX- walking 1000 ft- so won't send home on Lovenox  d/c3/14   Continued Need for Acute Rehabilitation Level of Care: The patient requires daily medical management by a physician with specialized training in physical medicine and rehabilitation for the following reasons: Direction of a multidisciplinary physical rehabilitation program to maximize functional independence : Yes Medical management of patient stability for increased activity during participation in an intensive rehabilitation regime.: Yes Analysis of laboratory values and/or radiology reports with any subsequent need for medication adjustment and/or medical intervention. : Yes   I attest that I was present, lead the team conference, and concur with the assessment and plan of the team.   Ernest Pine 08/21/2022, 2:22 PM

## 2022-08-21 NOTE — Progress Notes (Addendum)
Patient ID: Kristen Bautista, female   DOB: 25-Mar-1956, 67 y.o.   MRN: [id number]  SW met with pt in room to provide updates from team conference on gains made, d/c date remains 3/14, and now recommendation for outpatient therapy. Reports her cousin will be able to take her. She is aware SW will confirm with cousin Kristen Bautista. Pt provided handicap placard. Outpatient preference [facility].   1233-SW spoke with [name] to discuss above. SW reviewed discharge. No other questions/concerns reported. SW spoke with [name] to inform on above and review discharge.   SW updated [facility] on pt now being recommended for outpatient.   SW will send outpatient Pt/OT referral to [facility].  *referral faxed to [phone].  Loralee Pacas, MSW, [place] Office: [phone] Cell: [phone] Fax: [phone]

## 2022-08-21 NOTE — Progress Notes (Signed)
PROGRESS NOTE   Subjective/Complaints:  Pt reports crick in her neck from sleeping, but doing well otherwise No other pain  ROS:    Pt denies SOB, abd pain, CP, N/V/C/D, and vision changes Denies Sx's of UTI  Except for HPI  Objective:   No results found. Recent Labs    08/20/22 0437  WBC 11.2*  HGB 9.8*  HCT 28.3*  PLT 405*     Recent Labs    08/20/22 0437  NA 138  K 4.7  CL 103  CO2 25  GLUCOSE 102*  BUN 22  CREATININE 0.89  CALCIUM 9.3     Intake/Output Summary (Last 24 hours) at 08/21/2022 0901 Last data filed at 08/20/2022 1857 Gross per 24 hour  Intake 360 ml  Output --  Net 360 ml        Physical Exam: Vital Signs Blood pressure 120/63, pulse 81, temperature 98.2 F (36.8 C), temperature source Oral, resp. rate 18, height '5\' 7"'$  (1.702 m), weight 55.7 kg, SpO2 100 %.      General: awake, alert, appropriate, sitting up on bedside chair; watching TV; NAD HENT: conjugate gaze; oropharynx moist CV: regular rate; no JVD Pulmonary: CTA B/L; no W/R/R- good air movement GI: soft, NT, ND, (+)BS Psychiatric: appropriate Neurological: Ox3  Less spasticity noted since starting Baclofen  PRIOR EXAM: Neuro:  Alert and oriented x 3. Fair insight and awareness. Functional Memory. Normal language and speech. Cranial nerve exam unremarkable. UE motor 3 prox to 4/5 distally with decreased LT at finger tips bilaterally. LE 3+ prox to 4/5 distally with decreased LT below ankle both feet.  decreased to light touch C5 downwards Musculoskeletal: neck tender even in collar with basic head movement  MAS of 1 to 1+ in RUE- Hoffman's (+) Skin: Clean and intact without signs of breakdown. Incision looks great- has new dressing on it- no drainage or erythema  Assessment/Plan: 1. Functional deficits which require 3+ hours per day of interdisciplinary therapy in a comprehensive inpatient rehab  setting. Physiatrist is providing close team supervision and 24 hour management of active medical problems listed below. Physiatrist and rehab team continue to assess barriers to discharge/monitor patient progress toward functional and medical goals  Care Tool:  Bathing    Body parts bathed by patient: Right arm, Left arm, Chest, Abdomen, Front perineal area, Buttocks, Right upper leg, Left upper leg, Right lower leg, Left lower leg, Face   Body parts bathed by helper: Right arm, Left arm, Chest, Abdomen, Front perineal area, Buttocks, Right upper leg, Left upper leg, Right lower leg, Left lower leg, Face     Bathing assist Assist Level: Contact Guard/Touching assist     Upper Body Dressing/Undressing Upper body dressing   What is the patient wearing?: Hospital gown only    Upper body assist Assist Level: Supervision/Verbal cueing    Lower Body Dressing/Undressing Lower body dressing      What is the patient wearing?: Underwear/pull up, Pants     Lower body assist Assist for lower body dressing: Contact Guard/Touching assist     Toileting Toileting    Toileting assist Assist for toileting: Supervision/Verbal cueing     Transfers Chair/bed  transfer  Transfers assist     Chair/bed transfer assist level: Independent with assistive device     Locomotion Ambulation   Ambulation assist   Ambulation activity did not occur: Safety/medical concerns  Assist level: Independent with assistive device Assistive device: Rollator Max distance: 1000 ft   Walk 10 feet activity   Assist  Walk 10 feet activity did not occur: Safety/medical concerns  Assist level: Independent with assistive device Assistive device: Rollator   Walk 50 feet activity   Assist Walk 50 feet with 2 turns activity did not occur: Safety/medical concerns  Assist level: Independent with assistive device Assistive device: Rollator    Walk 150 feet activity   Assist Walk 150 feet  activity did not occur: Safety/medical concerns  Assist level: Independent with assistive device Assistive device: Rollator    Walk 10 feet on uneven surface  activity   Assist Walk 10 feet on uneven surfaces activity did not occur: Safety/medical concerns   Assist level: Supervision/Verbal cueing Assistive device: Rollator   Wheelchair     Assist Is the patient using a wheelchair?: No Type of Wheelchair: Manual Wheelchair activity did not occur: Safety/medical concerns         Wheelchair 50 feet with 2 turns activity    Assist    Wheelchair 50 feet with 2 turns activity did not occur: Safety/medical concerns       Wheelchair 150 feet activity     Assist  Wheelchair 150 feet activity did not occur: Safety/medical concerns       Blood pressure 120/63, pulse 81, temperature 98.2 F (36.8 C), temperature source Oral, resp. rate 18, height '5\' 7"'$  (1.702 m), weight 55.7 kg, SpO2 100 %.  Medical Problem List and Plan: 1. Functional deficits secondary to severe cervical stenosis with myelopathy/epidural abscess.  Status post open posterior cervical lateral instrumentation posterior lateral fusion C3-4 4-5 C5-6 as well as laminotomies evacuation of epidural abscess C3-4 07/28/2022.  Cervical collar when out of bed.             -patient may not shower- due to PICC -ELOS/Goals: 13 to 17 days, modified independent and supervision with PT and OT             -D/c 08/23/22 Pt asking for handicapped placard and about "$ at home"- asked her to contact SW about applying for food stamps Con't CIR PT and OT 2.  Antithrombotics: -DVT/anticoagulation:  Pharmaceutical: changed heparin to sq lovenox '40mg'$  daily to decrease sticks             -antiplatelet therapy: N/A 3. Pain Management: Oxycodone 5/'10mg'$  q3h PRN, Robaxin '500mg'$  q6h as needed  -is taking prior to therapies with good results.  -2/29- increased pain meds yesterday to 10-15 mg q 3 hours prn form 5-10 mg q3 hours  prn- has substantially helped per pt.  -3/4- will add Gabapentin 300 mg BID for nerve pain- monitor for side effects -3/5- Gabapentin already working for tingling pain- no side effects -3/7- pain doing MUCH better as well as nerve pain- con't gabapentin 3/11- no pain 0/10 4. Mood/Behavior/Sleep: Provide emotional support             -antipsychotic agents: N/A 5. Neuropsych/cognition: This patient is capable of making decisions on her own behalf. 6. Skin/Wound Care: Routine skin checks 7. Fluids/Electrolytes/Nutrition: encourage po. Weekly labs next 08/20/22 -added protein supplement. Continue vitamins -consulted RD 2/22 -can have food from home -begin megace trial '400mg'$  BID -08/11/22 eating well this morning -3/4-  Albumin up to 2.9 from 2.6 8.  ID.  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  Currently on vancomycin and ceftriaxone until 09/08/2022.    -2/27- last trough 13- per pharmacy  3/11- will stop IV ABX on Thursday and switch to PO ABX per ID 9.  Hypertension.  Norvasc 5 mg daily.     -borderline 2/23--obsv today  3/11- BP controlled con't regimen Vitals:   08/17/22 0430 08/17/22 1533 08/17/22 2011 08/18/22 0417  BP: 124/63 122/63 119/75 126/69   08/18/22 1328 08/18/22 2100 08/19/22 0417 08/19/22 1429  BP: 125/71 137/68 129/67 124/70   08/19/22 1935 08/20/22 0548 08/20/22 1444 08/21/22 0630  BP: 132/67 132/76 127/68 120/63    10.  History of alcohol/tobacco/cannabis use.  Provide counseling, monitor for withdrawal 11.  Slow transit Constipation/neurogenic bowel: pt has flatus, intake minimal over last few days.  -encouraged po intake, can have food from home  -change colace to senna-s 2 tabs at bedtime             -continue daily miralax 17g, sorbitol 60m as needed -08/12/22 had 12 stools documented yesterday, but pt denies diarrhea-- all soft/formed, had one this morning. Cont regimen.  -3/4- bowels working well finally -3/8- going every day right now -08/19/22 still having good  BMs, cont regimen (colace '100mg'$  QD, miralax 17g QD, SenokotS 2 tabs QHS) 12.  Aortic atherosclerosis             -Consider resume aspirin and atorvastatin on discharge 13. ABLA   -2/26 stable at HGB 9.6, monitor on weekly labs next 08/20/22  -3/4- stable- Hb 9.5  3/11- Hb 9.8 14. Thrombocytosis  -Likely reactive, no intervention at this time 15. Urinary incontinence -2/27- pt insistent to use Purewick- had one last night and said she doesn't want to get wet- since has difficulty holding her urine- will order Purewick for now- d/w nursing  -2/29- when pt needs to go, has urgency- let nursing know this AM  -08/11/22 no incontinence overnight, urinating well, monitor -3/4- will check U/A and Cx due to urgency and frequency since WBC up to 11.9  -3/5- insists on keeping purewick at night- refuses to let it be "taken away".  -08/18/22 U/A unremarkable but UCx with 60k CFU of VRE with essentially pan-resistance; pt without UTI symptoms, denies any frequency/urgency now, and WBCs normalized. Monitor for UTI symptoms but hold off on tx for now (pharmacist also messaged about this, and recommended same) -08/19/22 still not UTI symptoms, cont to monitor 3/11- not on ABX for UTI- no Sx's-  However went on contact precautions due to VRE in urine- no treatment since no Sx's 16. Leukocytosis  -3/4- will check U/A and Cx-   -3/6- will recheck labs in AM to follow up  -3/8- WBC down to 10.0, monitor on weekly labs next 08/20/22  3/12- WBC very slightly elevated at 11.2 17. Spasticity- mild  -3/8- will start baclofen 5 mg TID- educated pt on Sx's and what it means  3/11- Sx's better   I spent a total of 36   minutes on total care today- >50% coordination of care- due to  Team conference to finalize d/c  LOS: 20 days A FACE TO FACE EVALUATION WAS PERFORMED  Lynelle Weiler 08/21/2022, 9:01 AM

## 2022-08-21 NOTE — Progress Notes (Signed)
Physical Therapy Session Note  Patient Details  Name: Kristen Bautista MRN: [id number] Date of Birth: 1955/09/25  Today's Date: 08/21/2022 PT Individual Time: 1130-1200 PT Individual Time Calculation (min): 30 min   Short Term Goals: Week 2:  PT Short Term Goal 1 (Week 2): Pt will ambulate with CGA consistently >200 ft PT Short Term Goal 1 - Progress (Week 2): Met PT Short Term Goal 2 (Week 2): Pt will perform STS with supervision PT Short Term Goal 2 - Progress (Week 2): Met PT Short Term Goal 3 (Week 2): Pt will initiate stair training PT Short Term Goal 3 - Progress (Week 2): Met  Skilled Therapeutic Interventions/Progress Updates:    Pt received in recliner and agreeable to therapy.  No complaint of pain. Pt stood and ambulated with supervision throughout session. Ambulated to [facility] entrance and then around building to [facility] entrance with supervision. Ambulated over grass as pt reports plan to return to fishing. Navigated rollator on/off sidewalk safely. Pt returned to room and to recliner, was left with all needs in reach.  Therapy Documentation Precautions:  Precautions Precautions: Cervical Precaution Comments: ok for out of brace in bed, for showering, and for short trips to bathroom Required Braces or Orthoses: Cervical Brace Cervical Brace: Hard collar, Other (comment) Restrictions Weight Bearing Restrictions: No General:       Therapy/Group: Individual Therapy  Mickel Fuchs 08/21/2022, 12:10 PM

## 2022-08-21 NOTE — Progress Notes (Signed)
Occupational Therapy Session Note  Patient Details  Name: Kristen Bautista MRN: [id number] Date of Birth: 06-Feb-1956  Today's Date: 08/21/2022 OT Individual Time: 0930-1000 OT Individual Time Calculation (min): 30 min    Short Term Goals: Week 2:  OT Short Term Goal 1 (Week 2): Pt will thread LB clothing with min A OT Short Term Goal 2 (Week 2): Pt will perform toilet transfers with min A OT Short Term Goal 3 (Week 2): Pt will perfomr toileting tasks with mod A OT Short Term Goal 4 (Week 2): Pt will complete UB dressing tasks with mod A  Skilled Therapeutic Interventions/Progress Updates:    OT intervention with focus on functional amb with Rollator, BLE/BUE therex, and toileting to increase pt's independence with BADLs. Amb with Rollator at supervision level. NuStep level 6 7 mins BLE/BUE. NuStep 3 mins BLE only. Pt returned to room and requested to use toilet. All toileting at supervision level. Pt returned to recliner. All needs within reach.  Therapy Documentation Precautions:  Precautions Precautions: Cervical Precaution Comments: ok for out of brace in bed, for showering, and for short trips to bathroom Required Braces or Orthoses: Cervical Brace Cervical Brace: Hard collar, Other (comment) Restrictions Weight Bearing Restrictions: No   Pain: Pt denies pain this morning  Therapy/Group: Individual Therapy  [name] 08/21/2022, 11:07 AM

## 2022-08-21 NOTE — Progress Notes (Signed)
Physical Therapy Session Note  Patient Details  Name: KAYLANI LANE MRN: [id number] Date of Birth: February 12, 1956  Today's Date: 08/21/2022 PT Individual Time: 1345-1445 PT Individual Time Calculation (min): 60 min   Short Term Goals: Week 3:  PT Short Term Goal 1 (Week 3): =LTGs d/t ELOS  Skilled Therapeutic Interventions/Progress Updates: Pt presents reclined in recliner and agreeable to therapy.  Pt amb into BR w/ rollator and Mod I.  Pt performed toileting w/ supervision.  Pt amb >500' w/ rollator and mod I multiple trials.  Pt performed Nu-step at Level 5 x 9' w/o c/o.  Pt performed 7' w/ UES/LES and then 2' w/ LES only 2/2 c/o pressure posterior neck.  Pt returned to recliner w/ all needs in reach.     Therapy Documentation Precautions:  Precautions Precautions: Cervical Precaution Comments: ok for out of brace in bed, for showering, and for short trips to bathroom Required Braces or Orthoses: Cervical Brace Cervical Brace: Hard collar, Other (comment) Restrictions Weight Bearing Restrictions: No General:   Vital Signs:   Pain:0/10   Mobility: Bed Mobility Bed Mobility: Supine to Sit;Sit to Supine Supine to Sit: Independent with assistive device Sit to Supine: Independent with assistive device Transfers Transfers: Sit to Stand;Stand to Sit;Stand Pivot Transfers Sit to Stand: Independent with assistive device Stand to Sit: Independent with assistive device Stand Pivot Transfers: Independent with assistive device Transfer (Assistive device): Rolling walker Locomotion : Gait Ambulation: Yes Gait Assistance: Independent with assistive device Gait Distance (Feet): 1000 Feet Assistive device: Rollator Gait Gait: Yes Gait Pattern: Within Functional Limits Stairs / Additional Locomotion Stairs: Yes Stairs Assistance: Supervision/Verbal cueing Stair Management Technique: Step to pattern;Two rails Number of Stairs: 12 Height of Stairs: 6 Ramp: Independent with  assistive device Curb: Contact Guard/Touching assist  Trunk/Postural Assessment : Cervical Assessment Cervical Assessment: Exceptions to [facility] Thoracic Assessment Thoracic Assessment: Within Functional Limits Lumbar Assessment Lumbar Assessment: Within Functional Limits Postural Control Postural Control: Deficits on evaluation Trunk Control: greatly improved from eval, near normal  Balance: Balance Balance Assessed: Yes Static Sitting Balance Static Sitting - Balance Support: Feet supported Static Sitting - Level of Assistance: 6: Modified independent (Device/Increase time) Dynamic Sitting Balance Dynamic Sitting - Balance Support: During functional activity Dynamic Sitting - Level of Assistance: 6: Modified independent (Device/Increase time) Static Standing Balance Static Standing - Balance Support: During functional activity Static Standing - Level of Assistance: 6: Modified independent (Device/Increase time) Dynamic Standing Balance Dynamic Standing - Balance Support: During functional activity Dynamic Standing - Level of Assistance: 6: Modified independent (Device/Increase time) Exercises:   Other Treatments:      Therapy/Group: Individual Therapy  Ladoris Gene 08/21/2022, 2:48 PM

## 2022-08-21 NOTE — Progress Notes (Signed)
Occupational Therapy Session Note  Patient Details  Name: Kristen Bautista MRN: [id number] Date of Birth: [birth year]  Today's Date: 08/21/2022 OT Individual Time: 0700-0810 OT Individual Time Calculation (min): 70 min    Short Term Goals: Week 3:  OT Short Term Goal 1 (Week 3): STG=LTG 2/2 ELOS (continue working towards supervision LTG)  Skilled Therapeutic Interventions/Progress Updates:    OT intervention with focus on FMC/GMC during self feeding, toileting, and grooming tasks. Pt able to open all containers without assistance. Pt holding utensils without modifications. Amb in room using Rollator with supervision. Toileting with supervision. Pt doffed and donned hospital gowns with supervision. Pt reports she will be wearing dresses at home for ease of use. Pt remained in recliner with all needs within reach. RN present to administer medications.   Therapy Documentation Precautions:  Precautions Precautions: Cervical Precaution Comments: ok for out of brace in bed, for showering, and for short trips to bathroom Required Braces or Orthoses: Cervical Brace Cervical Brace: Hard collar, Other (comment) Restrictions Weight Bearing Restrictions: (P) No Pain: Pt reports neck discomfort; meds admin during therapy session   Therapy/Group: Individual Therapy  [name] 08/21/2022, 8:12 AM

## 2022-08-22 ENCOUNTER — Other Ambulatory Visit (HOSPITAL_COMMUNITY): Payer: Self-pay

## 2022-08-22 DIAGNOSIS — G959 Disease of spinal cord, unspecified: Secondary | ICD-10-CM | POA: Diagnosis not present

## 2022-08-22 MED ORDER — ADULT MULTIVITAMIN W/MINERALS CH: 1.0000 | ORAL_TABLET | Freq: Every day | ORAL | Status: DC

## 2022-08-22 MED ORDER — LEVOFLOXACIN 750 MG PO TABS
750.0000 mg | ORAL_TABLET | Freq: Every day | ORAL | 0 refills | Status: AC
  Filled 2022-08-22: qty 30, 30d supply, fill #0

## 2022-08-22 MED ORDER — POLYETHYLENE GLYCOL 3350 17 G PO PACK: 17.0000 g | PACK | Freq: Every day | ORAL | 0 refills | Status: DC

## 2022-08-22 MED ORDER — AMLODIPINE BESYLATE 5 MG PO TABS
5.0000 mg | ORAL_TABLET | Freq: Every day | ORAL | 0 refills | Status: DC
  Filled 2022-08-22: qty 30, 30d supply, fill #0

## 2022-08-22 MED ORDER — GABAPENTIN 300 MG PO CAPS
300.0000 mg | ORAL_CAPSULE | Freq: Two times a day (BID) | ORAL | 0 refills | Status: AC
  Filled 2022-08-22: qty 60, 30d supply, fill #0

## 2022-08-22 MED ORDER — BACLOFEN 5 MG PO TABS
5.0000 mg | ORAL_TABLET | Freq: Three times a day (TID) | ORAL | 0 refills | Status: AC
  Filled 2022-08-22: qty 90, 30d supply, fill #0

## 2022-08-22 MED ORDER — METHOCARBAMOL 500 MG PO TABS
500.0000 mg | ORAL_TABLET | Freq: Four times a day (QID) | ORAL | 0 refills | Status: DC | PRN
  Filled 2022-08-22: qty 60, 15d supply, fill #0

## 2022-08-22 MED ORDER — DOXYCYCLINE HYCLATE 100 MG PO TABS
100.0000 mg | ORAL_TABLET | Freq: Two times a day (BID) | ORAL | 0 refills | Status: AC
  Filled 2022-08-22: qty 60, 30d supply, fill #0

## 2022-08-22 MED ORDER — OXYCODONE HCL 10 MG PO TABS
10.0000 mg | ORAL_TABLET | ORAL | 0 refills | Status: DC | PRN
  Filled 2022-08-22: qty 30, 4d supply, fill #0

## 2022-08-22 MED ORDER — DOCUSATE SODIUM 100 MG PO CAPS: 100.0000 mg | ORAL_CAPSULE | Freq: Every day | ORAL | 0 refills | Status: DC

## 2022-08-22 NOTE — Progress Notes (Signed)
Occupational Therapy Session Note  Patient Details  Name: AAMIA UPTEGROVE MRN: [id number] Date of Birth: 1956/06/03  Today's Date: 08/22/2022 OT Individual Time: 1300-1345 OT Individual Time Calculation (min): 45 min    Short Term Goals: Week 3:  OT Short Term Goal 1 (Week 3): STG=LTG 2/2 ELOS (continue working towards supervision LTG)  Skilled Therapeutic Interventions/Progress Updates:    Pt resting in recliner upon arrival. OT intervention with focus on BUE dexterity and in-hand manipulation with deck of playing cards. Pt able to complete leaf shuffle but unable to complete shuffle. Pt able to hold deck of cards in hand and shuffle. Pt played 4 games of Tonk with OTA. Pt able to manage cards appropriately in BUE. Pt able to return cards to box appropriately. Pt remained in recliner. Pt pleased with progress and ready to return home tomorrow.   Therapy Documentation Precautions:  Precautions Precautions: Cervical Precaution Comments: ok for out of brace in bed, for showering, and for short trips to bathroom Required Braces or Orthoses: Cervical Brace Cervical Brace: Hard collar, Other (comment) Restrictions Weight Bearing Restrictions: No   Pain:  Pt c/o "heaviness" on Rt hip ("like I'm carrying a baby on my hip"); RN Angelina notified   Therapy/Group: Individual Therapy  [name] 08/22/2022, 1:48 PM

## 2022-08-22 NOTE — Progress Notes (Signed)
Occupational Therapy Session Note  Patient Details  Name: Kristen Bautista MRN: [id number] Date of Birth: 30-Oct-1955  Today's Date: 08/22/2022 OT Individual Time: [id number] OT Individual Time Calculation (min): 70 min    Short Term Goals: Week 3:  OT Short Term Goal 1 (Week 3): STG=LTG 2/2 ELOS (continue working towards supervision LTG)  Skilled Therapeutic Interventions/Progress Updates:    Pt resting in w/c upon arrival. Pt requested to use toilet and amb with Rollator into bathroom and completed toileting tasks with supervision. Pt amb with Rollator to gym. The following tests were performed:  9 Hole Peg Test is used to measure finger dexterity in pts with various neurological diagnoses. - Instructions The pt was instructed to pick up the pegs one at a time, using their dominant hand first and put them into the holes in any order until the holes were all filled. The pt then removed the pegs one at a time and returned them to the container. Both hands were tested separately.  - Results The pt completed the test in 68 seconds with LUE and 115 seconds for RUE. Scores are based on the time taken to complete the activity. The timer started the moment the pt touched the first peg until the moment the last peg hit the container. - Norms for healthy females ages 74-70+ 72-55 R 17.38 L 18.92 56-60 R 17.86 L 19.48 61-65 R 18.99 L 20.33 66-70 R 19.90 L 21.44 71+ R 22.49 L 24.11  Box and Blocks Test measures unilateral gross manual dexterity. - Instructions The pt was instructed to carry one block over at a time and go as quickly as they could, making sure their fingertips crossed the partition. One minute was given to complete the task per UE. The pt was allowed a 15-second trial period prior to testing if needed. - Results The pt transferred 36 blocks with the R hand and 39 with the L hand. The total number of blocks carried from one compartment to the other in one minute is  scored per hand. Higher scores on the test indicate better gross manual dexterity.  - Norms for adults females 50-75+ 50-54 R 77.7 L 74.3 55-59 R 74.7 L 73.6 60-64 R 76.1 L 73.6 65-69 R 72 L 71.3 70-74 R 68.6 L 68.3 75+ R 65.0 L 63.6    The Dynamometer Grip Strength Test is a quantitative and objective measure of isometric muscular strength of the hand and forearm.  -Instructions The patient was asked to sit with their back, pelvis, and knees at 90 degrees. The shoulder was adducted and neutrally rotated with The elbow flexed to 90 degrees and forearm in neutral. The arm was not supported.  -Results The score was determined by calculating the average of 3 trials. The pt's average score was 45 lbs in the R hand and *57 lbs in the L hand.  -[place]orms Female Average in lbs 55-59 R 57.3 L 47.3 60-64 R 55.1 L 45.7 65-69 R 49.6 L 41.0 70-74 R 49.6 L 41.5 75+ R 42.6 L 37.6   Pt returned to room and remained in recliner. Reviewed safety recommendations and changing on cervical collar pads. Belt alarm activated. All needs within reach.    Therapy Documentation Precautions:  Precautions Precautions: Cervical Precaution Comments: ok for out of brace in bed, for showering, and for short trips to bathroom Required Braces or Orthoses: Cervical Brace Cervical Brace: Hard collar, Other (comment) Restrictions Weight Bearing Restrictions: No  Pain:  Pt denies pain this  morning   Therapy/Group: Individual Therapy  [name] 08/22/2022, 12:07 PM

## 2022-08-22 NOTE — Progress Notes (Signed)
Physical Therapy Session Note  Patient Details  Name: Kristen Bautista MRN: [id number] Date of Birth: 1955-12-25  Today's Date: 08/22/2022 PT Individual Time: 1118-1200 + 1415-1455 PT Individual Time Calculation (min): 42 min + 40 min  Short Term Goals: Week 2:  PT Short Term Goal 1 (Week 2): Pt will ambulate with CGA consistently >200 ft PT Short Term Goal 1 - Progress (Week 2): Met PT Short Term Goal 2 (Week 2): Pt will perform STS with supervision PT Short Term Goal 2 - Progress (Week 2): Met PT Short Term Goal 3 (Week 2): Pt will initiate stair training PT Short Term Goal 3 - Progress (Week 2): Met  Skilled Therapeutic Interventions/Progress Updates:     Session 1: Chart reviewed and pt agreeable to therapy. Pt received seated in recliner with no c/o pain. Session focused on functional review in preparation of d/c and amb endurance to promote safe home acces. Pt initiated session with review of functional transfers using ModI + RW. Pt then amb to toilet using ModI + RW. Pt continued amb for 70mns after toileting with ModI + RW. At end of session, pt was left seated in recliner with alarm engaged, nurse call bell and all needs in reach.  Session 2: Chart reviewed and pt agreeable to therapy. Pt received seated in recliner with no c/o pain. Session focused on amb endurance and independence to promote safe home access. Pt initiated session with amb to toilet using ModI + RW. Pt then completed amb of 1062fto therapy gym with modI + RW. Pt then navigated 12 steps with supervision + B rails. Pt then took small seated break and discussed home safety. Pt then amb [id number] returned to room with [place] end of session, pt was left seated in recliner with alarm engaged, nurse call bell and all needs in reach.   Therapy Documentation Precautions:  Precautions Precautions: Cervical Precaution Comments: ok for out of brace in bed, for showering, and for short trips to bathroom Required  Braces or Orthoses: Cervical Brace Cervical Brace: Hard collar, Other (comment) Restrictions Weight Bearing Restrictions: No General:       Therapy/Group: Individual Therapy  KirMarquette OldT, DPT 08/22/2022, 3:48 PM

## 2022-08-22 NOTE — Progress Notes (Signed)
Occupational Therapy Discharge Summary  Patient Details  Name: Kristen Bautista MRN: [id number] Date of Birth: 1955/07/24  Date of Discharge from [facility] service:August 22, 2022   Patient has met 10 of 10 long term goals due to improved activity tolerance, improved balance, postural control, ability to compensate for deficits, and improved coordination.  Pt made steady progress with BADLs and functional transfers during this admission. Pt performs all functional transfers using Rollator with supervision. Bathing,dressing and toileting with supervision. Pt using BUE functionally but continues to experience decreased finger sensation and slight numbness. Pt's cousin has been present and participated in education. The following tests were performed:  9 Hole Peg Test is used to measure finger dexterity in pts with various neurological diagnoses. - Instructions The pt was instructed to pick up the pegs one at a time, using their dominant hand first and put them into the holes in any order until the holes were all filled. The pt then removed the pegs one at a time and returned them to the container. Both hands were tested separately.   - Results The pt completed the test in 68 seconds with LUE and 115 seconds for RUE. Scores are based on the time taken to complete the activity. The timer started the moment the pt touched the first peg until the moment the last peg hit the container. - Norms for healthy females ages 83-70+ 6-55 R 17.38 L 18.92 56-60 R 17.86 L 19.48 61-65 R 18.99 L 20.33 66-70 R 19.90 L 21.44 71+ R 22.49 L 24.11   Box and Blocks Test measures unilateral gross manual dexterity. - Instructions The pt was instructed to carry one block over at a time and go as quickly as they could, making sure their fingertips crossed the partition. One minute was given to complete the task per UE. The pt was allowed a 15-second trial period prior to testing if needed. - Results The pt  transferred 36 blocks with the R hand and 39 with the L hand. The total number of blocks carried from one compartment to the other in one minute is scored per hand. Higher scores on the test indicate better gross manual dexterity.   - Norms for adults females 50-75+ 50-54 R 77.7 L 74.3 55-59 R 74.7 L 73.6 60-64 R 76.1 L 73.6 65-69 R 72 L 71.3 70-74 R 68.6 L 68.3 75+ R 65.0 L 63.6      The Dynamometer Grip Strength Test is a quantitative and objective measure of isometric muscular strength of the hand and forearm.   -Instructions The patient was asked to sit with their back, pelvis, and knees at 90 degrees. The shoulder was adducted and neutrally rotated with The elbow flexed to 90 degrees and forearm in neutral. The arm was not supported.   -Results The score was determined by calculating the average of 3 trials. The pt's average score was 45 lbs in the R hand and *57 lbs in the L hand.   -Norms Female Average in lbs 55-59 R 57.3 L 47.3 60-64 R 55.1 L 45.7 65-69 R 49.6 L 41.0 70-74 R 49.6 L 41.5 75+ R 42.6 L 37.6  Patient to discharge at overall Supervision level.  Patient's care partner is independent to provide the necessary physical assistance at discharge.    Reasons goals not met: n/a  Recommendation:  Patient will benefit from ongoing skilled OT services in home health setting to continue to advance functional skills in the area of BADL and Reduce  care partner burden.  Equipment: No equipment provided  Reasons for discharge: treatment goals met and discharge from hospital  Patient/family agrees with progress made and goals achieved: Yes  OT Discharge ADL ADL Eating: Modified independent Grooming: Modified independent Upper Body Bathing: Supervision/safety Lower Body Bathing: Supervision/safety Where Assessed-Lower Body Bathing: Chair Upper Body Dressing: Supervision/safety Where Assessed-Upper Body Dressing: Chair Lower Body Dressing:  Supervision/safety Where Assessed-Lower Body Dressing: Chair Toileting: Modified independent Where Assessed-Toileting: Bed level Toilet Transfer: Modified independent Toilet Transfer Method: Counselling psychologist: Bedside commode Tub/Shower Transfer: Distant supervision Tub/Shower Transfer Method: Optometrist: Radio broadcast assistant Vision Baseline Vision/History: 1 Wears glasses Patient Visual Report: No change from baseline Vision Assessment?: No apparent visual deficits Perception  Perception: Within Functional Limits Praxis Praxis: Intact Cognition Cognition Overall Cognitive Status: Within Functional Limits for tasks assessed Arousal/Alertness: Awake/alert Orientation Level: Person;Place;Situation Person: Oriented Place: Oriented Situation: Oriented Memory: Appears intact Attention: Sustained Sustained Attention: Appears intact Awareness: Appears intact Problem Solving: Appears intact Safety/Judgment: Appears intact Brief Interview for Mental Status (BIMS) Repetition of Three Words (First Attempt): 3 Temporal Orientation: Year: Correct Temporal Orientation: Month: Accurate within 5 days Temporal Orientation: Day: Correct Recall: "Sock": Yes, no cue required Recall: "Blue": Yes, no cue required Recall: "Bed": Yes, no cue required BIMS Summary Score: 15 Sensation Sensation Light Touch: Impaired Detail Peripheral sensation comments: reports paresthesia, but improved from baseline Light Touch Impaired Details: Impaired LLE;Impaired RLE;Impaired RUE;Impaired LUE Proprioception: Impaired Detail Proprioception Impaired Details: Impaired RUE;Impaired LUE;Impaired RLE;Impaired LLE Stereognosis: Not tested Coordination Gross Motor Movements are Fluid and Coordinated: No Fine Motor Movements are Fluid and Coordinated: No Coordination and Movement Description: Mild LE dyscoordination, limited by UE coordination Finger Nose Finger Test:  dysmetric Motor  Motor Motor: Tetraplegia;Abnormal postural alignment and control;Abnormal tone Motor - Discharge Observations: extensor tone largely resolved, spasticity being treated medically   Trunk/Postural Assessment  Cervical Assessment Cervical Assessment: Exceptions to [facility] (cervical collar) Thoracic Assessment Thoracic Assessment: Within Functional Limits Lumbar Assessment Lumbar Assessment: Within Functional Limits Postural Control Postural Control: Deficits on evaluation Trunk Control: greatly improved from eval, near normal  Balance Static Sitting Balance Static Sitting - Level of Assistance: 6: Modified independent (Device/Increase time) Dynamic Sitting Balance Dynamic Sitting - Balance Support: During functional activity Dynamic Sitting - Level of Assistance: 6: Modified independent (Device/Increase time) Extremity/Trunk Assessment RUE Assessment Active Range of Motion (AROM) Comments: 0-110 General Strength Comments: impacted by pain; overall 3+/5; decr grip strength and decr coordination LUE Assessment Active Range of Motion (AROM) Comments: 0-110 General Strength Comments: impacted by pain; overall 3+/5; decr grip strength and decr coordination   [name] 08/22/2022, 6:59 AM

## 2022-08-22 NOTE — Progress Notes (Signed)
PROGRESS NOTE   Subjective/Complaints:  Pt reports no pain LBM this AM- has a tiny bit of neck tightness, but not like crick in neck yesterday ROS:    Pt denies SOB, abd pain, CP, N/V/C/D, and vision changes   Except for HPI  Objective:   No results found. Recent Labs    08/20/22 0437  WBC 11.2*  HGB 9.8*  HCT 28.3*  PLT 405*     Recent Labs    08/20/22 0437  NA 138  K 4.7  CL 103  CO2 25  GLUCOSE 102*  BUN 22  CREATININE 0.89  CALCIUM 9.3     Intake/Output Summary (Last 24 hours) at 08/22/2022 0843 Last data filed at 08/22/2022 [id number] Gross per 24 hour  Intake 4852.6 ml  Output 1200 ml  Net 3652.6 ml        Physical Exam: Vital Signs Blood pressure (!) 129/58, pulse (!) 102, temperature 99.1 F (37.3 C), temperature source Oral, resp. rate 18, height '5\' 7"'$  (1.702 m), weight 55.7 kg, SpO2 100 %.       General: awake, alert, appropriate, NAD HENT: conjugate gaze; oropharynx moist CV: regular rate; no JVD Pulmonary: CTA B/L; no W/R/R- good air movement GI: soft, NT, ND, (+)BS Psychiatric: appropriate Neurological: Ox3  Less spasticity noted since starting Baclofen  PRIOR EXAM: Neuro:  Alert and oriented x 3. Fair insight and awareness. Functional Memory. Normal language and speech. Cranial nerve exam unremarkable. UE motor 3 prox to 4/5 distally with decreased LT at finger tips bilaterally. LE 3+ prox to 4/5 distally with decreased LT below ankle both feet.  decreased to light touch C5 downwards Musculoskeletal: neck tender even in collar with basic head movement  MAS of 1 to 1+ in RUE- Hoffman's (+) Skin: Clean and intact without signs of breakdown. Incision looks great- has new dressing on it- no drainage or erythema  Assessment/Plan: 1. Functional deficits which require 3+ hours per day of interdisciplinary therapy in a comprehensive inpatient rehab setting. Physiatrist is providing  close team supervision and 24 hour management of active medical problems listed below. Physiatrist and rehab team continue to assess barriers to discharge/monitor patient progress toward functional and medical goals  Care Tool:  Bathing    Body parts bathed by patient: Right arm, Left arm, Chest, Abdomen, Front perineal area, Buttocks, Right upper leg, Left upper leg, Right lower leg, Left lower leg, Face   Body parts bathed by helper: Right arm, Left arm, Chest, Abdomen, Front perineal area, Buttocks, Right upper leg, Left upper leg, Right lower leg, Left lower leg, Face     Bathing assist Assist Level: Supervision/Verbal cueing     Upper Body Dressing/Undressing Upper body dressing   What is the patient wearing?: Hospital gown only, Orthosis Orthosis activity level: Performed by patient  Upper body assist Assist Level: Supervision/Verbal cueing    Lower Body Dressing/Undressing Lower body dressing      What is the patient wearing?: Underwear/pull up, Pants     Lower body assist Assist for lower body dressing: Supervision/Verbal cueing     Toileting Toileting    Toileting assist Assist for toileting: Supervision/Verbal cueing  Transfers Chair/bed transfer  Transfers assist     Chair/bed transfer assist level: Independent with assistive device     Locomotion Ambulation   Ambulation assist   Ambulation activity did not occur: Safety/medical concerns  Assist level: Independent with assistive device Assistive device: Rollator Max distance: >500'   Walk 10 feet activity   Assist  Walk 10 feet activity did not occur: Safety/medical concerns  Assist level: Independent with assistive device Assistive device: Rollator   Walk 50 feet activity   Assist Walk 50 feet with 2 turns activity did not occur: Safety/medical concerns  Assist level: Independent with assistive device Assistive device: Rollator    Walk 150 feet activity   Assist Walk 150  feet activity did not occur: Safety/medical concerns  Assist level: Independent with assistive device Assistive device: Rollator    Walk 10 feet on uneven surface  activity   Assist Walk 10 feet on uneven surfaces activity did not occur: Safety/medical concerns   Assist level: Independent with assistive device Assistive device: Rollator   Wheelchair     Assist Is the patient using a wheelchair?: No Type of Wheelchair: Manual Wheelchair activity did not occur: Safety/medical concerns         Wheelchair 50 feet with 2 turns activity    Assist    Wheelchair 50 feet with 2 turns activity did not occur: Safety/medical concerns       Wheelchair 150 feet activity     Assist  Wheelchair 150 feet activity did not occur: Safety/medical concerns       Blood pressure (!) 129/58, pulse (!) 102, temperature 99.1 F (37.3 C), temperature source Oral, resp. rate 18, height '5\' 7"'$  (1.702 m), weight 55.7 kg, SpO2 100 %.  Medical Problem List and Plan: 1. Functional deficits secondary to severe cervical stenosis with myelopathy/epidural abscess.  Status post open posterior cervical lateral instrumentation posterior lateral fusion C3-4 4-5 C5-6 as well as laminotomies evacuation of epidural abscess C3-4 07/28/2022.  Cervical collar when out of bed.             -patient may not shower- due to PICC -ELOS/Goals: 13 to 17 days, modified independent and supervision with PT and OT             -D/c 08/23/22 Pt asking for handicapped placard and about "$ at home"- asked her to contact SW about applying for food stamps Con't CIR PT and OT- can leave tomorrow after finishes IV ABX- PO ABX already determined 2.  Antithrombotics: -DVT/anticoagulation:  Pharmaceutical: changed heparin to sq lovenox '40mg'$  daily to decrease sticks             -antiplatelet therapy: N/A 3. Pain Management: Oxycodone 5/'10mg'$  q3h PRN, Robaxin '500mg'$  q6h as needed  -is taking prior to therapies with good results.   -2/29- increased pain meds yesterday to 10-15 mg q 3 hours prn form 5-10 mg q3 hours prn- has substantially helped per pt.  -3/4- will add Gabapentin 300 mg BID for nerve pain- monitor for side effects -3/5- Gabapentin already working for tingling pain- no side effects -3/7- pain doing MUCH better as well as nerve pain- con't gabapentin 3/11- no pain 0/10 4. Mood/Behavior/Sleep: Provide emotional support             -antipsychotic agents: N/A 5. Neuropsych/cognition: This patient is capable of making decisions on her own behalf. 6. Skin/Wound Care: Routine skin checks 7. Fluids/Electrolytes/Nutrition: encourage po. Weekly labs next 08/20/22 -added protein supplement. Continue vitamins -consulted RD 2/22 -  can have food from home -begin megace trial '400mg'$  BID -08/11/22 eating well this morning -3/4- Albumin up to 2.9 from 2.6 8.  ID.  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  Currently on vancomycin and ceftriaxone until 09/08/2022.    -2/27- last trough 13- per pharmacy  3/11- will stop IV ABX on Thursday and switch to PO ABX per ID  3/13- Doxcycline and Levafloxicin for another 30 days after d/c.  9.  Hypertension.  Norvasc 5 mg daily.     -borderline 2/23--obsv today  3/11- BP controlled con't regimen Vitals:   08/18/22 0417 08/18/22 1328 08/18/22 2100 08/19/22 0417  BP: 126/69 125/71 137/68 129/67   08/19/22 1429 08/19/22 1935 08/20/22 0548 08/20/22 1444  BP: 124/70 132/67 132/76 127/68   08/21/22 0630 08/21/22 1523 08/21/22 1914 08/22/22 0630  BP: 120/63 134/67 (!) 141/70 (!) 129/58    10.  History of alcohol/tobacco/cannabis use.  Provide counseling, monitor for withdrawal 11.  Slow transit Constipation/neurogenic bowel: pt has flatus, intake minimal over last few days.  -encouraged po intake, can have food from home  -change colace to senna-s 2 tabs at bedtime             -continue daily miralax 17g, sorbitol 48m as needed -08/12/22 had 12 stools documented yesterday, but pt denies  diarrhea-- all soft/formed, had one this morning. Cont regimen.  -3/4- bowels working well finally -3/8- going every day right now -08/19/22 still having good BMs, cont regimen (colace '100mg'$  QD, miralax 17g QD, SenokotS 2 tabs QHS) 3/13- having daily BM's 12.  Aortic atherosclerosis             -Consider resume aspirin and atorvastatin on discharge 13. ABLA   -2/26 stable at HGB 9.6, monitor on weekly labs next 08/20/22  -3/4- stable- Hb 9.5  3/11- Hb 9.8 14. Thrombocytosis  -Likely reactive, no intervention at this time 15. Urinary incontinence -2/27- pt insistent to use Purewick- had one last night and said she doesn't want to get wet- since has difficulty holding her urine- will order Purewick for now- d/w nursing  -2/29- when pt needs to go, has urgency- let nursing know this AM  -08/11/22 no incontinence overnight, urinating well, monitor -3/4- will check U/A and Cx due to urgency and frequency since WBC up to 11.9  -3/5- insists on keeping purewick at night- refuses to let it be "taken away".  -08/18/22 U/A unremarkable but UCx with 60k CFU of VRE with essentially pan-resistance; pt without UTI symptoms, denies any frequency/urgency now, and WBCs normalized. Monitor for UTI symptoms but hold off on tx for now (pharmacist also messaged about this, and recommended same) -08/19/22 still not UTI symptoms, cont to monitor 3/11- not on ABX for UTI- no Sx's-  However went on contact precautions due to VRE in urine- no treatment since no Sx's 16. Leukocytosis  -3/4- will check U/A and Cx-   -3/6- will recheck labs in AM to follow up  -3/8- WBC down to 10.0, monitor on weekly labs next 08/20/22  3/12- WBC very slightly elevated at 11.2 17. Spasticity- mild  -3/8- will start baclofen 5 mg TID- educated pt on Sx's and what it means  3/11- Sx's better    LOS: 21 days A FACE TO FACE EVALUATION WAS PERFORMED  Lynda Wanninger 08/22/2022, 8:43 AM

## 2022-08-22 NOTE — Progress Notes (Signed)
Inpatient Rehabilitation Care Coordinator Discharge Note   Patient Details  Name: Kristen Bautista MRN: [id number] Date of Birth: [birth year]   Discharge location: D/c to home  Length of Stay: 20 days  Discharge activity level: Supervision  Home/community participation: Limited  Patient response [id number] Literacy - How often do you need to have someone help you when you read instructions, pamphlets, or other written material from your doctor or pharmacy?: Never  Patient response [id number] Isolation - How often do you feel lonely or isolated from those around you?: Never  Services provided included: MD, RD, PT, OT, RN, CM, TR, Pharmacy, Neuropsych, SW  Financial Services:  Charity fundraiser Utilized: Multimedia programmer devoted [employer] offered to/list presented to: patient  Follow-up services arranged:  Outpatient, DME    Outpatient Servicies: [facility] for PT/OT DME : [employer] for Rollator and [employer] [facility]    Patient response to transportation need: Is the patient able to respond to transportation needs?: Yes In the past 12 months, has lack of transportation kept you from medical appointments or from getting medications?: No In the past 12 months, has lack of transportation kept you from meetings, work, or from getting things needed for daily living?: No    Comments (or additional information):  Patient/Family verbalized understanding of follow-up arrangements:  Yes  Individual responsible for coordination of the follow-up plan: contact pt [phone]  Confirmed correct DME delivered: Rana Snare 08/22/2022    Rana Snare

## 2022-08-22 NOTE — Progress Notes (Signed)
Closed out nursing education and nursing care plan for discharge.  

## 2022-08-22 NOTE — Progress Notes (Incomplete)
Inpatient Rehabilitation Discharge Medication Review by a Pharmacist  A complete drug regimen review was completed for this patient to identify any potential clinically significant medication issues.  High Risk Drug Classes Is patient taking? Indication by Medication  Antipsychotic No   Anticoagulant No   Antibiotic Yes Levaquin, doxycycline - epidural abscess   Opioid Yes Oxycodone - PRN pain  Antiplatelet No   Hypoglycemics/insulin No   Vasoactive Medication Yes Amlodipine - HTN  Chemotherapy No   Other Yes Baclofen, methocarbamol - muscle spasms Gabapentin - neuropathy/pain MVI - supplement Miralax, docusate - constipation     Type of Medication Issue Identified Description of Issue Recommendation(s)  Drug Interaction(s) (clinically significant)     Duplicate Therapy     Allergy     No Medication Administration End Date     Incorrect Dose     Additional Drug Therapy Needed     Significant med changes from prior encounter (inform family/care partners about these prior to discharge).    Other       Clinically significant medication issues were identified that warrant physician communication and completion of prescribed/recommended actions by midnight of the next day:  No  Pharmacist comments:  Per ID recommendations, discharge on 1 month of doxycycline 100 mg twice daily and levofloxacin 750 mg daily  Time spent performing this drug regimen review (minutes): 20   Thank you for allowing pharmacy to be a part of this patient's care.  ***

## 2022-08-23 DIAGNOSIS — G959 Disease of spinal cord, unspecified: Secondary | ICD-10-CM | POA: Diagnosis not present

## 2022-08-23 NOTE — Progress Notes (Signed)
PROGRESS NOTE   Subjective/Complaints:  No pain today- not taking pain meds- better since started Baclofen  No issues ready for d/c.  ROS:   Pt denies SOB, abd pain, CP, N/V/C/D, and vision changes  Except for HPI  Objective:   No results found. No results for input(s): "WBC", "HGB", "HCT", "PLT" in the last 72 hours.    No results for input(s): "NA", "K", "CL", "CO2", "GLUCOSE", "BUN", "CREATININE", "CALCIUM" in the last 72 hours.    Intake/Output Summary (Last 24 hours) at 08/23/2022 0852 Last data filed at 08/23/2022 0810 Gross per 24 hour  Intake 1730.92 ml  Output 1700 ml  Net 30.92 ml        Physical Exam: Vital Signs Blood pressure (!) 126/57, pulse 100, temperature 99 F (37.2 C), temperature source Oral, resp. rate 18, height '5\' 7"'$  (1.702 m), weight 55.7 kg, SpO2 100 %.        General: awake, alert, appropriate, NAD HENT: conjugate gaze; oropharynx moist- wearing cervical collar CV: regular rate; no JVD Pulmonary: CTA B/L; no W/R/R- good air movement GI: soft, NT, ND, (+)BS Psychiatric: appropriate Neurological: Ox3  Less spasticity noted since starting Baclofen  PRIOR EXAM: Neuro:  Alert and oriented x 3. Fair insight and awareness. Functional Memory. Normal language and speech. Cranial nerve exam unremarkable. UE motor 3 prox to 4/5 distally with decreased LT at finger tips bilaterally. LE 3+ prox to 4/5 distally with decreased LT below ankle both feet.  decreased to light touch C5 downwards Musculoskeletal: neck tender even in collar with basic head movement  MAS of 1 to 1+ in RUE- Hoffman's (+) Skin: Clean and intact without signs of breakdown. Incision looks great- has new dressing on it- no drainage or erythema  Assessment/Plan: 1. Functional deficits which require 3+ hours per day of interdisciplinary therapy in a comprehensive inpatient rehab setting. Physiatrist is providing close  team supervision and 24 hour management of active medical problems listed below. Physiatrist and rehab team continue to assess barriers to discharge/monitor patient progress toward functional and medical goals  Care Tool:  Bathing    Body parts bathed by patient: Right arm, Left arm, Chest, Abdomen, Front perineal area, Buttocks, Right upper leg, Left upper leg, Right lower leg, Left lower leg, Face   Body parts bathed by helper: Right arm, Left arm, Chest, Abdomen, Front perineal area, Buttocks, Right upper leg, Left upper leg, Right lower leg, Left lower leg, Face     Bathing assist Assist Level: Supervision/Verbal cueing     Upper Body Dressing/Undressing Upper body dressing   What is the patient wearing?: Hospital gown only, Orthosis Orthosis activity level: Performed by patient  Upper body assist Assist Level: Supervision/Verbal cueing    Lower Body Dressing/Undressing Lower body dressing      What is the patient wearing?: Underwear/pull up, Pants     Lower body assist Assist for lower body dressing: Supervision/Verbal cueing     Toileting Toileting    Toileting assist Assist for toileting: Supervision/Verbal cueing     Transfers Chair/bed transfer  Transfers assist     Chair/bed transfer assist level: Independent with assistive device     Locomotion Ambulation  Ambulation assist   Ambulation activity did not occur: Safety/medical concerns  Assist level: Independent with assistive device Assistive device: Rollator Max distance: >500'   Walk 10 feet activity   Assist  Walk 10 feet activity did not occur: Safety/medical concerns  Assist level: Independent with assistive device Assistive device: Rollator   Walk 50 feet activity   Assist Walk 50 feet with 2 turns activity did not occur: Safety/medical concerns  Assist level: Independent with assistive device Assistive device: Rollator    Walk 150 feet activity   Assist Walk 150 feet  activity did not occur: Safety/medical concerns  Assist level: Independent with assistive device Assistive device: Rollator    Walk 10 feet on uneven surface  activity   Assist Walk 10 feet on uneven surfaces activity did not occur: Safety/medical concerns   Assist level: Independent with assistive device Assistive device: Rollator   Wheelchair     Assist Is the patient using a wheelchair?: No Type of Wheelchair: Manual Wheelchair activity did not occur: N/A         Wheelchair 50 feet with 2 turns activity    Assist    Wheelchair 50 feet with 2 turns activity did not occur: N/A       Wheelchair 150 feet activity     Assist  Wheelchair 150 feet activity did not occur: N/A       Blood pressure (!) 126/57, pulse 100, temperature 99 F (37.2 C), temperature source Oral, resp. rate 18, height '5\' 7"'$  (1.702 m), weight 55.7 kg, SpO2 100 %.  Medical Problem List and Plan: 1. Functional deficits secondary to severe cervical stenosis with myelopathy/epidural abscess.  Status post open posterior cervical lateral instrumentation posterior lateral fusion C3-4 4-5 C5-6 as well as laminotomies evacuation of epidural abscess C3-4 07/28/2022.  Cervical collar when out of bed.             -patient may not shower- due to PICC -ELOS/Goals: 13 to 17 days, modified independent and supervision with PT and OT             -D/c 08/23/22 Pt asking for handicapped placard and about "$ at home"- asked her to contact SW about applying for food stamps Con't CIR PT and OT- can leave tomorrow after finishes IV ABX- PO ABX already determined D/c today on PO ABX- wrote to d/c PICC after gets today's IV ABX- also d/w nursing 2.  Antithrombotics: -DVT/anticoagulation:  Pharmaceutical: changed heparin to sq lovenox '40mg'$  daily to decrease sticks             -antiplatelet therapy: N/A 3. Pain Management: Oxycodone 5/'10mg'$  q3h PRN, Robaxin '500mg'$  q6h as needed  -is taking prior to therapies with  good results.  -2/29- increased pain meds yesterday to 10-15 mg q 3 hours prn form 5-10 mg q3 hours prn- has substantially helped per pt.  -3/4- will add Gabapentin 300 mg BID for nerve pain- monitor for side effects -3/5- Gabapentin already working for tingling pain- no side effects -3/7- pain doing MUCH better as well as nerve pain- con't gabapentin 3/11- no pain 0/10 3/14- No pain and not taking Oxycodone- will not send home on it 4. Mood/Behavior/Sleep: Provide emotional support             -antipsychotic agents: N/A 5. Neuropsych/cognition: This patient is capable of making decisions on her own behalf. 6. Skin/Wound Care: Routine skin checks 7. Fluids/Electrolytes/Nutrition: encourage po. Weekly labs next 08/20/22 -added protein supplement. Continue vitamins -consulted RD 2/22 -  can have food from home -begin megace trial '400mg'$  BID -08/11/22 eating well this morning -3/4- Albumin up to 2.9 from 2.6 8.  ID.  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  Currently on vancomycin and ceftriaxone until 09/08/2022.    -2/27- last trough 13- per pharmacy  3/11- will stop IV ABX on Thursday and switch to PO ABX per ID  3/13- Doxcycline and Levafloxicin for another 30 days after d/c.  9.  Hypertension.  Norvasc 5 mg daily.     -borderline 2/23--obsv today  3/11- BP controlled con't regimen Vitals:   08/19/22 0417 08/19/22 1429 08/19/22 1935 08/20/22 0548  BP: 129/67 124/70 132/67 132/76   08/20/22 1444 08/21/22 0630 08/21/22 1523 08/21/22 1914  BP: 127/68 120/63 134/67 (!) 141/70   08/22/22 0630 08/22/22 1421 08/22/22 2135 08/23/22 0454  BP: (!) 129/58 122/69 122/75 (!) 126/57    10.  History of alcohol/tobacco/cannabis use.  Provide counseling, monitor for withdrawal 11.  Slow transit Constipation/neurogenic bowel: pt has flatus, intake minimal over last few days.  -encouraged po intake, can have food from home  -change colace to senna-s 2 tabs at bedtime             -continue daily miralax 17g,  sorbitol 37m as needed -08/12/22 had 12 stools documented yesterday, but pt denies diarrhea-- all soft/formed, had one this morning. Cont regimen.  -3/4- bowels working well finally -3/8- going every day right now -08/19/22 still having good BMs, cont regimen (colace '100mg'$  QD, miralax 17g QD, SenokotS 2 tabs QHS) 3/13- having daily BM's 12.  Aortic atherosclerosis             -Consider resume aspirin and atorvastatin on discharge 13. ABLA   -2/26 stable at HGB 9.6, monitor on weekly labs next 08/20/22  -3/4- stable- Hb 9.5  3/11- Hb 9.8 14. Thrombocytosis  -Likely reactive, no intervention at this time 15. Urinary incontinence -2/27- pt insistent to use Purewick- had one last night and said she doesn't want to get wet- since has difficulty holding her urine- will order Purewick for now- d/w nursing  -2/29- when pt needs to go, has urgency- let nursing know this AM  -08/11/22 no incontinence overnight, urinating well, monitor -3/4- will check U/A and Cx due to urgency and frequency since WBC up to 11.9  -3/5- insists on keeping purewick at night- refuses to let it be "taken away".  -08/18/22 U/A unremarkable but UCx with 60k CFU of VRE with essentially pan-resistance; pt without UTI symptoms, denies any frequency/urgency now, and WBCs normalized. Monitor for UTI symptoms but hold off on tx for now (pharmacist also messaged about this, and recommended same) -08/19/22 still not UTI symptoms, cont to monitor 3/11- not on ABX for UTI- no Sx's-  However went on contact precautions due to VRE in urine- no treatment since no Sx's 16. Leukocytosis  -3/4- will check U/A and Cx-   -3/6- will recheck labs in AM to follow up  -3/8- WBC down to 10.0, monitor on weekly labs next 08/20/22  3/12- WBC very slightly elevated at 11.2  3/14- no signs of illness- afebrile 17. Spasticity- mild  -3/8- will start baclofen 5 mg TID- educated pt on Sx's and what it means  3/11- Sx's better   F/U with Dr LDagoberto Ligason  cervical myelopathy- doesn't need pain meds, [facility] and to see PCP in next 30 days.     LOS: 22 days A FACE TO FACE EVALUATION  WAS PERFORMED  Ashlee Bewley 08/23/2022, 8:52 AM

## 2022-08-23 NOTE — Progress Notes (Addendum)
INPATIENT REHABILITATION DISCHARGE NOTE   Discharge instructions by: Linna Hoff PA  Verbalized understanding:yes  Skin care/Wound care healing?none  Pain:addressed  IV's: Picc removal Tubes/Drains: none O2:none  Safety instructions:done  Patient belongings:done  Discharged to: home Discharged via:car  Notes: family in room  [employer] BSN, RN-BC, [employer]

## 2022-08-23 NOTE — Progress Notes (Signed)
Inpatient Rehabilitation Discharge Medication Review by a Pharmacist  A complete drug regimen review was completed for this patient to identify any potential clinically significant medication issues.  High Risk Drug Classes Is patient taking? Indication by Medication  Antipsychotic No   Anticoagulant No   Antibiotic Yes Levaquin, doxycycline - epidural abscess   Opioid Yes Oxycodone - PRN pain  Antiplatelet No   Hypoglycemics/insulin No   Vasoactive Medication Yes Amlodipine - HTN  Chemotherapy No   Other Yes Baclofen, methocarbamol - muscle spasms Gabapentin - neuropathy/pain MVI - supplement Miralax, docusate - constipation     Type of Medication Issue Identified Description of Issue Recommendation(s)  Drug Interaction(s) (clinically significant)     Duplicate Therapy     Allergy     No Medication Administration End Date     Incorrect Dose     Additional Drug Therapy Needed     Significant med changes from prior encounter (inform family/care partners about these prior to discharge).    Other       Clinically significant medication issues were identified that warrant physician communication and completion of prescribed/recommended actions by midnight of the next day:  No  Pharmacist comments:  Per ID recommendations, discharge on 1 month of doxycycline 100 mg twice daily and levofloxacin 750 mg daily  Time spent performing this drug regimen review (minutes): 20     Derrian Poli A. Levada Dy, PharmD, BCPS, FNKF Clinical Pharmacist [facility] Please utilize Amion for appropriate phone number to reach the unit pharmacist ([place])

## 2022-08-24 ENCOUNTER — Telehealth: Payer: Self-pay

## 2022-08-24 NOTE — Telephone Encounter (Signed)
Transitional Care call--who you talked with    Are you/is patient experiencing any problems since coming home? Are there any questions regarding any aspect of care? NO Are there any questions regarding medications administration/dosing? Are meds being taken as prescribed? Patient should review meds with caller to confirm No/YES Have there been any falls? NO Has Home Health been to the house and/or have they contacted you? If not, have you tried to contact them? Can we help you contact them? No Are bowels and bladder emptying properly? Are there any unexpected incontinence issues? If applicable, is patient following bowel/bladder programs? YES Any fevers, problems with breathing, unexpected pain? NO Are there any skin problems or new areas of breakdown? NO Has the patient/family member arranged specialty MD follow up (ie cardiology/neurology/renal/surgical/etc)?  Can we help arrange? NO Does the patient need any other services or support that we can help arrange? NO Are caregivers following through as expected in assisting the patient? NO Has the patient quit smoking, drinking alcohol, or using drugs as recommended? NO   Appointment time, arrive time and who it is with here [address] [phone]

## 2022-08-29 ENCOUNTER — Ambulatory Visit: Payer: No Typology Code available for payment source | Admitting: Internal Medicine

## 2022-08-29 NOTE — Progress Notes (Deleted)
[facility] for Infectious Disease  CHIEF COMPLAINT:    Follow up for cervical epidural abscess  SUBJECTIVE:    Kristen Bautista is a 67 y.o. female with PMHx as below who presents to the clinic for cervical epidural abscess.   She was admitted at [facility] 07/26/22 - 08/01/22 then at inpatient rehab 08/01/22  -08/23/22.  She initially presented with cervical stenosis, myelopathy due to epidural abscess s/p OR 07/28/22 for C3-6 posterior cervical decompression and instrumented fusion. Operative cultures finalized as no growth.  She was treated inpatient with ceftriaxone and vancomycin and plan was initially to do this regimen x 6 weeks through 09/08/22.  However, there was cost and PICC line concerns post CIR discharge.  We saw patient again on 2/28 and recommended continuing IV ceftriaxone and vancomycin while at rehab through 3/14 then discharging home with doxycycline 100mg  BID and Levaquin 750mg  daily for 30 more days.  This will have completed approximately 8 weeks of antibiotics post surgical intervention by the time she finishes antibiotics on 09/23/22.  Please see A&P for the details of today's visit and status of the patient's medical problems.   Patient's Medications  New Prescriptions   No medications on file  Previous Medications   ACETAMINOPHEN (TYLENOL) 325 MG TABLET    Take 2 tablets (650 mg total) by mouth every 6 (six) hours as needed for mild pain (or Fever >/= 101).   AMLODIPINE (NORVASC) 5 MG TABLET    Take 1 tablet (5 mg total) by mouth daily.   BACLOFEN 5 MG TABS    Take 1 tablet (5 mg total) by mouth 3 (three) times daily.   DOCUSATE SODIUM (COLACE) 100 MG CAPSULE    Take 1 capsule (100 mg total) by mouth daily.   DOXYCYCLINE (VIBRA-TABS) 100 MG TABLET    Take 1 tablet (100 mg total) by mouth 2 (two) times daily.   GABAPENTIN (NEURONTIN) 300 MG CAPSULE    Take 1 capsule (300 mg total) by mouth 2 (two) times daily.   LEVOFLOXACIN (LEVAQUIN) 750 MG TABLET    Take 1  tablet (750 mg total) by mouth daily.   METHOCARBAMOL (ROBAXIN) 500 MG TABLET    Take 1 tablet (500 mg total) by mouth every 6 (six) hours as needed for muscle spasms.   MULTIPLE VITAMIN (MULTIVITAMIN WITH MINERALS) TABS TABLET    Take 1 tablet by mouth daily.   OXYCODONE HCL 10 MG TABS    Take 1 tablet (10 mg total) by mouth every 3 (three) hours as needed for severe pain ((score 7 to 10)).   POLYETHYLENE GLYCOL (MIRALAX / GLYCOLAX) 17 G PACKET    Take 17 g by mouth daily.  Modified Medications   No medications on file  Discontinued Medications   No medications on file      Past Medical History:  Diagnosis Date   Alcohol abuse    Anemia    Aortic atherosclerosis (HCC)    Cannabis abuse    Centrilobular emphysema (HCC)    Cervical myelopathy (HCC)    with BUE paresthesias   Hepatitis-C    HLD (hyperlipidemia)    HTN (hypertension)    Nicotine addiction    Tobacco abuse     Social History   Tobacco Use   Smoking status: Every Day    Packs/day: 0.50    Years: 16.00    Additional pack years: 0.00    Total pack years: 8.00    Types:  Cigarettes    Passive exposure: Past   Smokeless tobacco: Never  Vaping Use   Vaping Use: Never used  Substance Use Topics   Alcohol use: Yes    Alcohol/week: 42.0 standard drinks of alcohol    Types: 42 Cans of beer per week    Comment: 6 pack of beer/day   Drug use: Yes    Frequency: 2.0 times per week    Types: Marijuana    Comment: occasional    Family History  Problem Relation Age of Onset   High blood pressure Mother     No Known Allergies  ROS   OBJECTIVE:    There were no vitals filed for this visit. There is no height or weight on file to calculate BMI.  Physical Exam   Labs and Microbiology:    Latest Ref Rng & Units 08/20/2022    4:37 AM 08/16/2022    4:17 AM 08/13/2022    4:44 AM  CBC  WBC 4.0 - 10.5 K/uL 11.2  10.0  11.9   Hemoglobin 12.0 - 15.0 g/dL 9.8  8.6  9.5   Hematocrit 36.0 - 46.0 % 28.3  25.1  28.3    Platelets 150 - 400 K/uL 405  383  415       Latest Ref Rng & Units 08/20/2022    4:37 AM 08/17/2022    9:34 AM 08/16/2022    4:17 AM  CMP  Glucose 70 - 99 mg/dL 102  83  103   BUN 8 - 23 mg/dL 22  22  25    Creatinine 0.44 - 1.00 mg/dL 0.89  0.85  0.95   Sodium 135 - 145 mmol/L 138  136  136   Potassium 3.5 - 5.1 mmol/L 4.7  4.0  4.5   Chloride 98 - 111 mmol/L 103  104  104   CO2 22 - 32 mmol/L 25  26  24    Calcium 8.9 - 10.3 mg/dL 9.3  8.9  9.0   Total Protein 6.5 - 8.1 g/dL 6.7     Total Bilirubin 0.3 - 1.2 mg/dL 0.5     Alkaline Phos 38 - 126 U/L 43     AST 15 - 41 U/L 29     ALT 0 - 44 U/L 27        No results found for this or any previous visit (from the past 240 hour(s)).  Imaging: ***   ASSESSMENT & PLAN:    No problem-specific Assessment & Plan notes found for this encounter.   No orders of the defined types were placed in this encounter.    There are no diagnoses linked to this encounter.  ***   Raynelle Highland for Infectious Disease [facility] 08/29/2022, 1:14 PM

## 2022-08-30 ENCOUNTER — Ambulatory Visit: Payer: No Typology Code available for payment source | Admitting: Internal Medicine

## 2022-08-30 NOTE — Progress Notes (Deleted)
[facility] for Infectious Disease  CHIEF COMPLAINT:    Follow up for cervical epidural abscess  SUBJECTIVE:    Kristen Bautista is a 67 y.o. female with PMHx as below who presents to the clinic for cervical epidural abscess.   She was admitted at [facility] 07/26/22 - 08/01/22 then at inpatient rehab 08/01/22  -08/23/22.  She initially presented with cervical stenosis, myelopathy due to epidural abscess s/p OR 07/28/22 for C3-6 posterior cervical decompression and instrumented fusion. Operative cultures finalized as no growth.  She was treated inpatient with ceftriaxone and vancomycin and plan was initially to do this regimen x 6 weeks through 09/08/22.  However, there was cost and PICC line concerns post CIR discharge.  We saw patient again on 2/28 and recommended continuing IV ceftriaxone and vancomycin while at rehab through 3/14 then discharging home with doxycycline 100mg  BID and Levaquin 750mg  daily for 30 more days.  This will have completed approximately 8 weeks of antibiotics post surgical intervention by the time she finishes antibiotics on 09/23/22.  Please see A&P for the details of today's visit and status of the patient's medical problems.   Patient's Medications  New Prescriptions   No medications on file  Previous Medications   ACETAMINOPHEN (TYLENOL) 325 MG TABLET    Take 2 tablets (650 mg total) by mouth every 6 (six) hours as needed for mild pain (or Fever >/= 101).   AMLODIPINE (NORVASC) 5 MG TABLET    Take 1 tablet (5 mg total) by mouth daily.   BACLOFEN 5 MG TABS    Take 1 tablet (5 mg total) by mouth 3 (three) times daily.   DOCUSATE SODIUM (COLACE) 100 MG CAPSULE    Take 1 capsule (100 mg total) by mouth daily.   DOXYCYCLINE (VIBRA-TABS) 100 MG TABLET    Take 1 tablet (100 mg total) by mouth 2 (two) times daily.   GABAPENTIN (NEURONTIN) 300 MG CAPSULE    Take 1 capsule (300 mg total) by mouth 2 (two) times daily.   LEVOFLOXACIN (LEVAQUIN) 750 MG TABLET    Take 1  tablet (750 mg total) by mouth daily.   METHOCARBAMOL (ROBAXIN) 500 MG TABLET    Take 1 tablet (500 mg total) by mouth every 6 (six) hours as needed for muscle spasms.   MULTIPLE VITAMIN (MULTIVITAMIN WITH MINERALS) TABS TABLET    Take 1 tablet by mouth daily.   OXYCODONE HCL 10 MG TABS    Take 1 tablet (10 mg total) by mouth every 3 (three) hours as needed for severe pain ((score 7 to 10)).   POLYETHYLENE GLYCOL (MIRALAX / GLYCOLAX) 17 G PACKET    Take 17 g by mouth daily.  Modified Medications   No medications on file  Discontinued Medications   No medications on file      Past Medical History:  Diagnosis Date   Alcohol abuse    Anemia    Aortic atherosclerosis (HCC)    Cannabis abuse    Centrilobular emphysema (HCC)    Cervical myelopathy (HCC)    with BUE paresthesias   Hepatitis-C    HLD (hyperlipidemia)    HTN (hypertension)    Nicotine addiction    Tobacco abuse     Social History   Tobacco Use   Smoking status: Every Day    Packs/day: 0.50    Years: 16.00    Additional pack years: 0.00    Total pack years: 8.00    Types:  Cigarettes    Passive exposure: Past   Smokeless tobacco: Never  Vaping Use   Vaping Use: Never used  Substance Use Topics   Alcohol use: Yes    Alcohol/week: 42.0 standard drinks of alcohol    Types: 42 Cans of beer per week    Comment: 6 pack of beer/day   Drug use: Yes    Frequency: 2.0 times per week    Types: Marijuana    Comment: occasional    Family History  Problem Relation Age of Onset   High blood pressure Mother     No Known Allergies  ROS   OBJECTIVE:    There were no vitals filed for this visit. There is no height or weight on file to calculate BMI.  Physical Exam   Labs and Microbiology:    Latest Ref Rng & Units 08/20/2022    4:37 AM 08/16/2022    4:17 AM 08/13/2022    4:44 AM  CBC  WBC 4.0 - 10.5 K/uL 11.2  10.0  11.9   Hemoglobin 12.0 - 15.0 g/dL 9.8  8.6  9.5   Hematocrit 36.0 - 46.0 % 28.3  25.1  28.3    Platelets 150 - 400 K/uL 405  383  415       Latest Ref Rng & Units 08/20/2022    4:37 AM 08/17/2022    9:34 AM 08/16/2022    4:17 AM  CMP  Glucose 70 - 99 mg/dL 102  83  103   BUN 8 - 23 mg/dL 22  22  25    Creatinine 0.44 - 1.00 mg/dL 0.89  0.85  0.95   Sodium 135 - 145 mmol/L 138  136  136   Potassium 3.5 - 5.1 mmol/L 4.7  4.0  4.5   Chloride 98 - 111 mmol/L 103  104  104   CO2 22 - 32 mmol/L 25  26  24    Calcium 8.9 - 10.3 mg/dL 9.3  8.9  9.0   Total Protein 6.5 - 8.1 g/dL 6.7     Total Bilirubin 0.3 - 1.2 mg/dL 0.5     Alkaline Phos 38 - 126 U/L 43     AST 15 - 41 U/L 29     ALT 0 - 44 U/L 27        No results found for this or any previous visit (from the past 240 hour(s)).  Imaging: ***   ASSESSMENT & PLAN:    No problem-specific Assessment & Plan notes found for this encounter.   No orders of the defined types were placed in this encounter.    There are no diagnoses linked to this encounter.  ***   Raynelle Highland for Infectious Disease [facility] 08/30/2022, 5:48 AM

## 2022-09-03 ENCOUNTER — Ambulatory Visit (HOSPITAL_COMMUNITY)
Admission: EM | Admit: 2022-09-03 | Discharge: 2022-09-03 | Disposition: A | Discharge status: Home / Self Care | Payer: No Typology Code available for payment source

## 2022-09-03 ENCOUNTER — Encounter (HOSPITAL_COMMUNITY): Payer: Self-pay | Admitting: Emergency Medicine

## 2022-09-03 DIAGNOSIS — K5900 Constipation, unspecified: Secondary | ICD-10-CM

## 2022-09-03 DIAGNOSIS — I10 Essential (primary) hypertension: Secondary | ICD-10-CM | POA: Diagnosis not present

## 2022-09-03 DIAGNOSIS — R112 Nausea with vomiting, unspecified: Secondary | ICD-10-CM | POA: Diagnosis not present

## 2022-09-03 DIAGNOSIS — T50905A Adverse effect of unspecified drugs, medicaments and biological substances, initial encounter: Secondary | ICD-10-CM | POA: Diagnosis not present

## 2022-09-03 MED ORDER — POLYETHYLENE GLYCOL 3350 17 G PO PACK: 17.0000 g | PACK | Freq: Every day | ORAL | 0 refills | Status: DC

## 2022-09-03 NOTE — Discharge Instructions (Addendum)
Your abdominal pain/physical exam findings are consistent with constipation. Start taking MiraLAX once daily until you are able to have a soft normal bowel movement.  Once you are able to have a soft normal bowel movement, MiraLAX once daily as needed.  Increase the amount of fiber you are eating by eating more fruits, vegetables, and whole grains.  Increase your water intake to at least 8 cups of water per day to maintain good hydration.  Take Colace stool softener daily to prevent constipation in the future. You may purchase colace over the counter and take this once daily to help keep stools soft.   Stop taking amlodipine BP medication as I believe that is what is causing your leg swelling. Elevate your legs to reduce swelling. Wear compression socks provided to you by the hospital. Follow-up with your primary care provider as scheduled on September 12, 2022.  If you have not had a bowel movement in the next 2 to 3 days, please return to urgent care.  If you develop any new or worsening symptoms that are severe, please go to the emergency room for further evaluation.  I hope you feel better!

## 2022-09-03 NOTE — ED Triage Notes (Signed)
Pt reports got out of hospital on 3/14. Started medications that was prescribed on 3/15. Reports feet are swelling since got out of hospital. Reports that was taking stool softener in hospital and now only passing "pebbles". Reports today started vomiting.  Pt c/o right arm pain that started before operation on her neck.  Pt requesting to know all her side effects of the medications she is on.

## 2022-09-03 NOTE — ED Provider Notes (Signed)
[place]    CSN: [id number] Arrival date & time: 09/03/22  1034      History   Chief Complaint Chief Complaint  Patient presents with   Emesis   Foot Swelling    HPI Kristen Bautista is a 67 y.o. female.   Patient presents to urgent care for evaluation of swelling to both feet that started approximately 5 to 6 days ago.  Of note, patient also underwent cervical fusion C3-C6 on July 28, 2022 and spent one month in the hospital.She is concerned that this may be a medication side effect as she was recently discharged from the hospital on August 23, 2022 and placed on multiple new medications including doxycycline, amlodipine, gabapentin, oxycodone, baclofen, and Colace.   On chart review, able to see that patient has taken amlodipine in the past.  Denies pain to the bilateral lower extremities, numbness and tingling, recent falls/injuries, and redness/warmth to the legs. She states her feet/legs have never become swollen in the past. Denies orthopnea, cough, fever/chills, or calf pain.  She states since being discharged from the hospital, she will walk approximately 1 block outside when the weather is nice using her walker.  Denies recent long periods of sitting down without movement.  She also complains of stools that appear to be "pebbles" as well as increased effort with defecation and needing to strain to have a bowel movement starting a few days ago.  She was discharged home from the hospital with prescription for oxycodone 10 mg to be used every 3 hours as needed for pain.  She states that she has not taken any of this medication since coming home from the hospital since she has not had any pain.  She has been taking her stool softener as prescribed.  Has not been taking her MiraLAX that was prescribed to her from hospital. Reports nausea with one episode of emesis this morning. Emesis contents appeared to be clear and without blood/bilious emesis or bowel contents. She is no  longer nauseous.    Emesis   Past Medical History:  Diagnosis Date   Alcohol abuse    Anemia    Aortic atherosclerosis ([place])    Cannabis abuse    Centrilobular emphysema (HCC)    Cervical myelopathy ([facility])    with BUE paresthesias   Hepatitis-C    HLD (hyperlipidemia)    HTN (hypertension)    Nicotine addiction    Tobacco abuse     Patient Active Problem List   Diagnosis Date Noted   Substance use disorder 08/10/2022   History of substance use 08/09/2022   Cervical stenosis of spine 07/30/2022   Epidural abscess 07/30/2022   Acute renal failure ([place]) 07/30/2022   Moderate malnutrition ([place]) 07/28/2022   Cervical myelopathy ([place]) 07/26/2022   DNR (do not resuscitate)/DNI(Do Not Intubate) 07/26/2022    Past Surgical History:  Procedure Laterality Date   ABDOMINAL HERNIA REPAIR     POSTERIOR CERVICAL FUSION/FORAMINOTOMY N/A 07/28/2022   Procedure: POSTERIOR CERVICAL DECOMPRESSION, INSTRUMENTATION AND FUSION CERVICAL THREE-CERVICAL SIX;  Surgeon: Karsten Ro, DO;  Location: [facility];  Service: Neurosurgery;  Laterality: N/A;    OB History     Gravida  0   Para  0   Term  0   Preterm  0   AB  0   Living         SAB  0   IAB  0   Ectopic  0   Multiple  Live Births               Home Medications    Prior to Admission medications   Medication Sig Start Date End Date Taking? Authorizing Provider  acetaminophen (TYLENOL) 325 MG tablet Take 2 tablets (650 mg total) by mouth every 6 (six) hours as needed for mild pain (or Fever >/= 101). 08/01/22   Eulogio Bear U, DO  amLODipine (NORVASC) 5 MG tablet Take 1 tablet (5 mg total) by mouth daily. 08/22/22   Angiulli, Lavon Paganini, PA-C  atorvastatin (LIPITOR) 40 MG tablet Take 40 mg by mouth daily.    [provider]  Baclofen 5 MG TABS Take 1 tablet (5 mg total) by mouth 3 (three) times daily. 08/22/22   Angiulli, Lavon Paganini, PA-C  Cobalamin Combinations (0000000 + FOLIC ACID) 123456 MCG TBDP Take 1  tablet by mouth daily at 2 PM.    [provider]  cyclobenzaprine (FLEXERIL) 10 MG tablet Take 10 mg by mouth 3 (three) times daily as needed for muscle spasms.    [provider]  docusate sodium (COLACE) 100 MG capsule Take 1 capsule (100 mg total) by mouth daily. 08/22/22   Angiulli, Lavon Paganini, PA-C  doxycycline (VIBRA-TABS) 100 MG tablet Take 1 tablet (100 mg total) by mouth 2 (two) times daily. 08/23/22 09/23/22  Angiulli, Lavon Paganini, PA-C  gabapentin (NEURONTIN) 300 MG capsule Take 1 capsule (300 mg total) by mouth 2 (two) times daily. 08/22/22   Angiulli, Lavon Paganini, PA-C  levofloxacin (LEVAQUIN) 750 MG tablet Take 1 tablet (750 mg total) by mouth daily. 08/23/22 09/23/22  Angiulli, Lavon Paganini, PA-C  methocarbamol (ROBAXIN) 500 MG tablet Take 1 tablet (500 mg total) by mouth every 6 (six) hours as needed for muscle spasms. 08/22/22   Angiulli, Lavon Paganini, PA-C  Multiple Vitamin (MULTIVITAMIN WITH MINERALS) TABS tablet Take 1 tablet by mouth daily. 08/22/22   Angiulli, Lavon Paganini, PA-C  Oxycodone HCl 10 MG TABS Take 1 tablet (10 mg total) by mouth every 3 (three) hours as needed for severe pain ((score 7 to 10)). 08/22/22   Angiulli, Lavon Paganini, PA-C  polyethylene glycol (MIRALAX / GLYCOLAX) 17 g packet Take 17 g by mouth daily. 09/03/22   Talbot Grumbling, FNP    Family History Family History  Problem Relation Age of Onset   High blood pressure Mother     Social History Social History   Tobacco Use   Smoking status: Every Day    Packs/day: 0.50    Years: 16.00    Additional pack years: 0.00    Total pack years: 8.00    Types: Cigarettes    Passive exposure: Past   Smokeless tobacco: Never  Vaping Use   Vaping Use: Never used  Substance Use Topics   Alcohol use: Yes    Alcohol/week: 42.0 standard drinks of alcohol    Types: 42 Cans of beer per week    Comment: 6 pack of beer/day   Drug use: Yes    Frequency: 2.0 times per week    Types: Marijuana    Comment: occasional      Allergies   Patient has no known allergies.   Review of Systems Review of Systems  Gastrointestinal:  Positive for vomiting.  Per HPI   Physical Exam Triage Vital Signs ED Triage Vitals  Enc Vitals Group     BP 09/03/22 1248 118/74     Pulse Rate 09/03/22 1248 98     Resp 09/03/22  1248 17     Temp 09/03/22 1248 98 F (36.7 C)     Temp Source 09/03/22 1248 Oral     SpO2 09/03/22 1248 98 %     Weight --      Height --      Head Circumference --      Peak Flow --      Pain Score 09/03/22 1245 8     Pain Loc --      Pain Edu? --      Excl. in [place]? --    No data found.  Updated Vital Signs BP 118/74 (BP Location: Left Arm)   Pulse 98   Temp 98 F (36.7 C) (Oral)   Resp 17   SpO2 98%   Visual Acuity Right Eye Distance:   Left Eye Distance:   Bilateral Distance:    Right Eye Near:   Left Eye Near:    Bilateral Near:     Physical Exam Vitals and nursing note reviewed.  Constitutional:      Appearance: She is not ill-appearing or toxic-appearing.  HENT:     Head: Normocephalic and atraumatic.     Right Ear: Hearing and external ear normal.     Left Ear: Hearing and external ear normal.     Nose: Nose normal.     Mouth/Throat:     Lips: Pink.     Mouth: Mucous membranes are moist. No injury.     Tongue: No lesions. Tongue does not deviate from midline.     Palate: No mass and lesions.     Pharynx: Oropharynx is clear. Uvula midline. No pharyngeal swelling, oropharyngeal exudate, posterior oropharyngeal erythema or uvula swelling.     Tonsils: No tonsillar exudate or tonsillar abscesses.  Eyes:     General: Lids are normal. Vision grossly intact. Gaze aligned appropriately.     Extraocular Movements: Extraocular movements intact.     Conjunctiva/sclera: Conjunctivae normal.  Neck:     Comments: Surgical scar present to the posterior cervical spine. Drainage, erythema, or warmth. Incision site remains well approximated.  Cardiovascular:     Rate and  Rhythm: Normal rate and regular rhythm.     Pulses:          Dorsalis pedis pulses are 2+ on the right side and 2+ on the left side.     Heart sounds: Normal heart sounds, S1 normal and S2 normal.  Pulmonary:     Effort: Pulmonary effort is normal. No respiratory distress.     Breath sounds: Normal breath sounds and air entry.  Abdominal:     General: Abdomen is flat. Bowel sounds are normal. There is no distension.     Palpations: Abdomen is soft.     Tenderness: There is no abdominal tenderness. There is no right CVA tenderness, left CVA tenderness, guarding or rebound.     Hernia: No hernia is present.  Musculoskeletal:     Cervical back: Neck supple. No tenderness.     Right lower leg: Edema (+1 pitting edema to the right foot) present.     Left lower leg: Edema (+1 pitting edema to the left foot) present.  Feet:     Right foot:     Skin integrity: Dry skin present. No ulcer, blister, skin breakdown, erythema, warmth or callus.     Toenail Condition: Right toenails are normal.     Left foot:     Skin integrity: Dry skin present. No ulcer, blister, skin breakdown, erythema,  warmth or callus.     Toenail Condition: Left toenails are normal.     Comments: Full range of motion to the bilateral feet.  Sensation and strength intact bilaterally with dorsi flexion and plantarflexion (5/5). Skin:    General: Skin is warm and dry.     Capillary Refill: Capillary refill takes less than 2 seconds.     Findings: No rash.  Neurological:     General: No focal deficit present.     Mental Status: She is alert and oriented to person, place, and time. Mental status is at baseline.     Cranial Nerves: No dysarthria or facial asymmetry.  Psychiatric:        Mood and Affect: Mood normal.        Speech: Speech normal.        Behavior: Behavior normal.        Thought Content: Thought content normal.        Judgment: Judgment normal.      UC Treatments / Results  Labs (all labs ordered are  listed, but only abnormal results are displayed) Labs Reviewed - No data to display  EKG   Radiology No results found.  Procedures Procedures (including critical care time)  Medications Ordered in UC Medications - No data to display  Initial Impression / Assessment and Plan / UC Course  I have reviewed the triage vital signs and the nursing notes.  Pertinent labs & imaging results that were available during my care of the patient were reviewed by me and considered in my medical decision making (see chart for details).  1.  Constipation MiraLAX sent to pharmacy to be taken once daily until able to have a normal soft bowel movement, then as needed.  Advised increased water intake and fiber intake.  Continued use of stool softener recommended.  She is nontender to abdominal exam and without peritoneal signs.  No distention.  Low suspicion for abdominal pathology.  Appears well-hydrated.  Tolerating food and fluids well without current nausea.  PCP follow-up if symptoms fail to improve as scheduled on September 12, 2022.  2.  Adverse effect of drug, essential hypertension Suspect bilateral feet swelling is likely reaction to amlodipine.  Advised to stop taking this medication and wear compression socks provided to her in the hospital.  She states these are difficult to place on her feet but she is able to have her son help her put them on.  Advised to elevate the legs throughout the day to reduce swelling as well.  BP is well controlled at this time and currently 118/74. Patient has a follow-up appointment with PCP on September 12, 2022, advised to discuss HTN medication management with PCP at appointment. Will defer placing patient on another antihypertensive medication until PCP appointment since she is overall well appearing with stable cardiopulmonary exam and vitals.  Discussed physical exam and available lab work findings in clinic with patient.  Counseled patient regarding appropriate use of  medications and potential side effects for all medications recommended or prescribed today. Discussed red flag signs and symptoms of worsening condition,when to call the PCP office, return to urgent care, and when to seek higher level of care in the emergency department. Patient verbalizes understanding and agreement with plan. All questions answered. Patient discharged in stable condition.    Final Clinical Impressions(s) / UC Diagnoses   Final diagnoses:  Constipation, unspecified constipation type  Nausea and vomiting, unspecified vomiting type  Adverse effect of drug, initial encounter  Discharge Instructions      Your abdominal pain/physical exam findings are consistent with constipation. Start taking MiraLAX once daily until you are able to have a soft normal bowel movement.  Once you are able to have a soft normal bowel movement, MiraLAX once daily as needed.  Increase the amount of fiber you are eating by eating more fruits, vegetables, and whole grains.  Increase your water intake to at least 8 cups of water per day to maintain good hydration.  Take Colace stool softener daily to prevent constipation in the future. You may purchase colace over the counter and take this once daily to help keep stools soft.   Stop taking amlodipine BP medication as I believe that is what is causing your leg swelling. Elevate your legs to reduce swelling. Wear compression socks provided to you by the hospital. Follow-up with your primary care provider as scheduled on September 12, 2022.  If you have not had a bowel movement in the next 2 to 3 days, please return to urgent care.  If you develop any new or worsening symptoms that are severe, please go to the emergency room for further evaluation.  I hope you feel better!     ED Prescriptions     Medication Sig Dispense Auth. Provider   polyethylene glycol (MIRALAX / GLYCOLAX) 17 g packet Take 17 g by mouth daily. 17 each Talbot Grumbling,  FNP      PDMP not reviewed this encounter.   Talbot Grumbling, [place] 09/03/22 [phone]

## 2022-09-12 ENCOUNTER — Encounter: Payer: No Typology Code available for payment source | Admitting: Registered Nurse

## 2022-09-12 DIAGNOSIS — R140 Abdominal distension (gaseous): Secondary | ICD-10-CM | POA: Diagnosis not present

## 2022-09-12 DIAGNOSIS — D62 Acute posthemorrhagic anemia: Secondary | ICD-10-CM | POA: Diagnosis not present

## 2022-09-12 DIAGNOSIS — R600 Localized edema: Secondary | ICD-10-CM | POA: Diagnosis not present

## 2022-09-12 DIAGNOSIS — I2510 Atherosclerotic heart disease of native coronary artery without angina pectoris: Secondary | ICD-10-CM | POA: Diagnosis not present

## 2022-09-12 DIAGNOSIS — F17200 Nicotine dependence, unspecified, uncomplicated: Secondary | ICD-10-CM | POA: Diagnosis not present

## 2022-09-12 DIAGNOSIS — K7460 Unspecified cirrhosis of liver: Secondary | ICD-10-CM | POA: Diagnosis not present

## 2022-09-12 DIAGNOSIS — R296 Repeated falls: Secondary | ICD-10-CM | POA: Diagnosis not present

## 2022-09-12 DIAGNOSIS — B182 Chronic viral hepatitis C: Secondary | ICD-10-CM | POA: Diagnosis not present

## 2022-09-12 DIAGNOSIS — F1290 Cannabis use, unspecified, uncomplicated: Secondary | ICD-10-CM | POA: Diagnosis not present

## 2022-09-12 DIAGNOSIS — D75839 Thrombocytosis, unspecified: Secondary | ICD-10-CM | POA: Diagnosis not present

## 2022-09-12 DIAGNOSIS — I700 Atherosclerosis of aorta: Secondary | ICD-10-CM | POA: Diagnosis not present

## 2022-09-12 DIAGNOSIS — Z0000 Encounter for general adult medical examination without abnormal findings: Secondary | ICD-10-CM | POA: Diagnosis not present

## 2022-09-12 DIAGNOSIS — G061 Intraspinal abscess and granuloma: Secondary | ICD-10-CM | POA: Diagnosis not present

## 2022-09-12 DIAGNOSIS — M4802 Spinal stenosis, cervical region: Secondary | ICD-10-CM | POA: Diagnosis not present

## 2022-09-14 ENCOUNTER — Other Ambulatory Visit: Payer: Self-pay | Admitting: Internal Medicine

## 2022-09-14 DIAGNOSIS — E2839 Other primary ovarian failure: Secondary | ICD-10-CM

## 2022-09-14 DIAGNOSIS — Z13820 Encounter for screening for osteoporosis: Secondary | ICD-10-CM

## 2022-09-19 ENCOUNTER — Other Ambulatory Visit: Payer: Self-pay | Admitting: Internal Medicine

## 2022-09-19 DIAGNOSIS — K7460 Unspecified cirrhosis of liver: Secondary | ICD-10-CM

## 2022-09-24 ENCOUNTER — Ambulatory Visit
Admission: RE | Admit: 2022-09-24 | Discharge: 2022-09-24 | Disposition: A | Discharge status: Home / Self Care | Payer: No Typology Code available for payment source | Source: Ambulatory Visit | Attending: Internal Medicine | Admitting: Internal Medicine

## 2022-09-24 DIAGNOSIS — K8020 Calculus of gallbladder without cholecystitis without obstruction: Secondary | ICD-10-CM | POA: Diagnosis not present

## 2022-09-24 DIAGNOSIS — K7460 Unspecified cirrhosis of liver: Secondary | ICD-10-CM | POA: Diagnosis not present

## 2022-09-26 ENCOUNTER — Ambulatory Visit
Admission: RE | Admit: 2022-09-26 | Discharge: 2022-09-26 | Disposition: A | Discharge status: Home / Self Care | Payer: No Typology Code available for payment source | Source: Ambulatory Visit | Attending: Internal Medicine

## 2022-09-26 DIAGNOSIS — R188 Other ascites: Secondary | ICD-10-CM | POA: Diagnosis not present

## 2022-09-26 DIAGNOSIS — K7460 Unspecified cirrhosis of liver: Secondary | ICD-10-CM

## 2022-10-03 ENCOUNTER — Ambulatory Visit: Payer: No Typology Code available for payment source | Attending: Internal Medicine | Admitting: Internal Medicine

## 2022-10-03 ENCOUNTER — Encounter: Payer: Self-pay | Admitting: Internal Medicine

## 2022-10-03 VITALS — BP 135/80 | HR 107 | Ht 67.0 in | Wt 134.0 lb

## 2022-10-03 DIAGNOSIS — E785 Hyperlipidemia, unspecified: Secondary | ICD-10-CM | POA: Diagnosis not present

## 2022-10-03 DIAGNOSIS — I700 Atherosclerosis of aorta: Secondary | ICD-10-CM | POA: Diagnosis not present

## 2022-10-03 DIAGNOSIS — B182 Chronic viral hepatitis C: Secondary | ICD-10-CM

## 2022-10-03 DIAGNOSIS — I2510 Atherosclerotic heart disease of native coronary artery without angina pectoris: Secondary | ICD-10-CM | POA: Diagnosis not present

## 2022-10-03 MED ORDER — ATORVASTATIN CALCIUM 40 MG PO TABS: 40.0000 mg | ORAL_TABLET | Freq: Every day | ORAL | 3 refills | Status: DC

## 2022-10-03 MED ORDER — ASPIRIN 81 MG PO TBEC: 81.0000 mg | DELAYED_RELEASE_TABLET | Freq: Every day | ORAL | 3 refills | Status: DC

## 2022-10-03 NOTE — Patient Instructions (Addendum)
Medication Instructions:  Your physician recommends that you continue on your current medications as directed. Please refer to the Current Medication list given to you today. START: Aspirin 81 mg by mouth once daily REFILLED: atorvastatin (Lipitor) 40 mg  *If you need a refill on your cardiac medications before your next appointment, please call your pharmacy*   Lab Work: IN 2 MONTHS: Fasting lipid panel, liver function test and Lipoprotein a : nothing to eat or drink 8-12 hours prior except water and black coffee  If you have labs (blood work) drawn today and your tests are completely normal, you will receive your results only by: MyChart Message (if you have MyChart) OR A paper copy in the mail If you have any lab test that is abnormal or we need to change your treatment, we will call you to review the results.   Testing/Procedures: NONE   Follow-Up: At [facility], you and your health needs are our priority.  As part of our continuing mission to provide you with exceptional heart care, we have created designated Provider Care Teams.  These Care Teams include your primary Cardiologist (physician) and Advanced Practice Providers (APPs -  Physician Assistants and Nurse Practitioners) who all work together to provide you with the care you need, when you need it.     Your next appointment:   1 year(s)  Provider:   Jari Favre, PA-C, Robin Searing, NP, Eligha Bridegroom, NP, or Tereso Newcomer, PA-C
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
## 2022-10-09 ENCOUNTER — Encounter (HOSPITAL_COMMUNITY): Payer: Self-pay

## 2022-10-09 ENCOUNTER — Emergency Department (HOSPITAL_COMMUNITY): Payer: No Typology Code available for payment source

## 2022-10-09 ENCOUNTER — Other Ambulatory Visit: Payer: Self-pay

## 2022-10-09 ENCOUNTER — Telehealth: Payer: Self-pay | Admitting: Internal Medicine

## 2022-10-09 ENCOUNTER — Inpatient Hospital Stay (HOSPITAL_COMMUNITY)
Admission: EM | Admit: 2022-10-09 | Discharge: 2022-10-13 | DRG: 176 | Disposition: A | Discharge status: Home / Self Care | Payer: No Typology Code available for payment source | Attending: Internal Medicine | Admitting: Internal Medicine

## 2022-10-09 DIAGNOSIS — Z1152 Encounter for screening for COVID-19: Secondary | ICD-10-CM

## 2022-10-09 DIAGNOSIS — K8020 Calculus of gallbladder without cholecystitis without obstruction: Secondary | ICD-10-CM | POA: Diagnosis present

## 2022-10-09 DIAGNOSIS — J432 Centrilobular emphysema: Secondary | ICD-10-CM | POA: Diagnosis present

## 2022-10-09 DIAGNOSIS — Z66 Do not resuscitate: Secondary | ICD-10-CM | POA: Diagnosis present

## 2022-10-09 DIAGNOSIS — R0902 Hypoxemia: Secondary | ICD-10-CM | POA: Diagnosis present

## 2022-10-09 DIAGNOSIS — L03113 Cellulitis of right upper limb: Secondary | ICD-10-CM | POA: Diagnosis present

## 2022-10-09 DIAGNOSIS — R000 Tachycardia, unspecified: Secondary | ICD-10-CM | POA: Diagnosis not present

## 2022-10-09 DIAGNOSIS — J90 Pleural effusion, not elsewhere classified: Secondary | ICD-10-CM

## 2022-10-09 DIAGNOSIS — E871 Hypo-osmolality and hyponatremia: Secondary | ICD-10-CM | POA: Diagnosis present

## 2022-10-09 DIAGNOSIS — E785 Hyperlipidemia, unspecified: Secondary | ICD-10-CM

## 2022-10-09 DIAGNOSIS — Z7982 Long term (current) use of aspirin: Secondary | ICD-10-CM

## 2022-10-09 DIAGNOSIS — E8809 Other disorders of plasma-protein metabolism, not elsewhere classified: Secondary | ICD-10-CM | POA: Diagnosis present

## 2022-10-09 DIAGNOSIS — E876 Hypokalemia: Secondary | ICD-10-CM | POA: Diagnosis present

## 2022-10-09 DIAGNOSIS — F1010 Alcohol abuse, uncomplicated: Secondary | ICD-10-CM

## 2022-10-09 DIAGNOSIS — R6510 Systemic inflammatory response syndrome (SIRS) of non-infectious origin without acute organ dysfunction: Secondary | ICD-10-CM

## 2022-10-09 DIAGNOSIS — R091 Pleurisy: Secondary | ICD-10-CM | POA: Diagnosis not present

## 2022-10-09 DIAGNOSIS — Z79899 Other long term (current) drug therapy: Secondary | ICD-10-CM

## 2022-10-09 DIAGNOSIS — I2699 Other pulmonary embolism without acute cor pulmonale: Secondary | ICD-10-CM | POA: Diagnosis not present

## 2022-10-09 DIAGNOSIS — I10 Essential (primary) hypertension: Secondary | ICD-10-CM

## 2022-10-09 DIAGNOSIS — Y95 Nosocomial condition: Secondary | ICD-10-CM | POA: Diagnosis present

## 2022-10-09 DIAGNOSIS — F17210 Nicotine dependence, cigarettes, uncomplicated: Secondary | ICD-10-CM | POA: Diagnosis present

## 2022-10-09 DIAGNOSIS — I700 Atherosclerosis of aorta: Secondary | ICD-10-CM | POA: Diagnosis present

## 2022-10-09 DIAGNOSIS — Z981 Arthrodesis status: Secondary | ICD-10-CM

## 2022-10-09 DIAGNOSIS — M25531 Pain in right wrist: Secondary | ICD-10-CM | POA: Diagnosis not present

## 2022-10-09 DIAGNOSIS — R1011 Right upper quadrant pain: Secondary | ICD-10-CM | POA: Diagnosis not present

## 2022-10-09 DIAGNOSIS — D72829 Elevated white blood cell count, unspecified: Secondary | ICD-10-CM | POA: Diagnosis present

## 2022-10-09 DIAGNOSIS — L03115 Cellulitis of right lower limb: Secondary | ICD-10-CM | POA: Diagnosis not present

## 2022-10-09 DIAGNOSIS — D638 Anemia in other chronic diseases classified elsewhere: Secondary | ICD-10-CM | POA: Diagnosis present

## 2022-10-09 DIAGNOSIS — J9811 Atelectasis: Secondary | ICD-10-CM | POA: Diagnosis present

## 2022-10-09 DIAGNOSIS — J189 Pneumonia, unspecified organism: Secondary | ICD-10-CM

## 2022-10-09 DIAGNOSIS — K7460 Unspecified cirrhosis of liver: Secondary | ICD-10-CM | POA: Diagnosis present

## 2022-10-09 DIAGNOSIS — I2510 Atherosclerotic heart disease of native coronary artery without angina pectoris: Secondary | ICD-10-CM | POA: Diagnosis present

## 2022-10-09 DIAGNOSIS — F1210 Cannabis abuse, uncomplicated: Secondary | ICD-10-CM | POA: Diagnosis present

## 2022-10-09 LAB — COMPREHENSIVE METABOLIC PANEL
ALT: 9 U/L (ref 0–44)
AST: 30 U/L (ref 15–41)
Albumin: 2.7 g/dL — ABNORMAL LOW (ref 3.5–5.0)
Alkaline Phosphatase: 67 U/L (ref 38–126)
Anion gap: 13 (ref 5–15)
BUN: 15 mg/dL (ref 8–23)
CO2: 21 mmol/L — ABNORMAL LOW (ref 22–32)
Calcium: 9.1 mg/dL (ref 8.9–10.3)
Chloride: 98 mmol/L (ref 98–111)
Creatinine, Ser: 0.82 mg/dL (ref 0.44–1.00)
GFR, Estimated: >60 mL/min (ref >60)
Glucose, Bld: 107 mg/dL — ABNORMAL HIGH (ref 70–99)
Potassium: 3.3 mmol/L — ABNORMAL LOW (ref 3.5–5.1)
Sodium: 132 mmol/L — ABNORMAL LOW (ref 135–145)
Total Bilirubin: 0.7 mg/dL (ref 0.3–1.2)
Total Protein: 7.7 g/dL (ref 6.5–8.1)

## 2022-10-09 LAB — URINALYSIS, ROUTINE W REFLEX MICROSCOPIC
Bilirubin Urine: NEGATIVE
Glucose, UA: NEGATIVE mg/dL
Ketones, ur: NEGATIVE mg/dL
Leukocytes,Ua: NEGATIVE
Nitrite: NEGATIVE
Protein, ur: 30 mg/dL — AB
Specific Gravity, Urine: 1.009 (ref 1.005–1.030)
pH: 5 (ref 5.0–8.0)

## 2022-10-09 LAB — TROPONIN I (HIGH SENSITIVITY): Troponin I (High Sensitivity): 8 ng/L (ref <18)

## 2022-10-09 LAB — CBC
HCT: 27.2 % — ABNORMAL LOW (ref 36.0–46.0)
Hemoglobin: 9.1 g/dL — ABNORMAL LOW (ref 12.0–15.0)
MCH: 27.9 pg (ref 26.0–34.0)
MCHC: 33.5 g/dL (ref 30.0–36.0)
MCV: 83.4 fL (ref 80.0–100.0)
Platelets: 409 10*3/uL — ABNORMAL HIGH (ref 150–400)
RBC: 3.26 MIL/uL — ABNORMAL LOW (ref 3.87–5.11)
RDW: 13.6 % (ref 11.5–15.5)
WBC: 17 10*3/uL — ABNORMAL HIGH (ref 4.0–10.5)
nRBC: 0 % (ref 0.0–0.2)

## 2022-10-09 LAB — BRAIN NATRIURETIC PEPTIDE: B Natriuretic Peptide: 46.7 pg/mL (ref 0.0–100.0)

## 2022-10-09 LAB — LACTIC ACID, PLASMA
Lactic Acid, Venous: 0.9 mmol/L (ref 0.5–1.9)
Lactic Acid, Venous: 1.2 mmol/L (ref 0.5–1.9)

## 2022-10-09 LAB — LIPASE, BLOOD: Lipase: 49 U/L (ref 11–51)

## 2022-10-09 MED ORDER — VANCOMYCIN HCL 1250 MG/250ML IV SOLN
1250.0000 mg | INTRAVENOUS | Status: DC
  Administered 2022-10-10: 1250 mg via INTRAVENOUS
  Filled 2022-10-09 (×2): qty 250

## 2022-10-09 MED ORDER — HEPARIN (PORCINE) 25000 UT/250ML-% IV SOLN
1250.0000 [IU]/h | INTRAVENOUS | Status: DC
  Administered 2022-10-09: 1000 [IU]/h via INTRAVENOUS
  Filled 2022-10-09 (×2): qty 250

## 2022-10-09 MED ORDER — PIPERACILLIN-TAZOBACTAM 3.375 G IVPB 30 MIN
3.3750 g | Freq: Once | INTRAVENOUS | Status: AC
  Administered 2022-10-09: 3.375 g via INTRAVENOUS
  Filled 2022-10-09: qty 50

## 2022-10-09 MED ORDER — ONDANSETRON HCL 4 MG/2ML IJ SOLN
4.0000 mg | Freq: Once | INTRAMUSCULAR | Status: AC
  Administered 2022-10-09: 4 mg via INTRAVENOUS
  Filled 2022-10-09: qty 2

## 2022-10-09 MED ORDER — HEPARIN BOLUS VIA INFUSION
3500.0000 [IU] | Freq: Once | INTRAVENOUS | Status: AC
  Administered 2022-10-09: 3500 [IU] via INTRAVENOUS
  Filled 2022-10-09: qty 3500

## 2022-10-09 MED ORDER — VANCOMYCIN HCL 1250 MG/250ML IV SOLN
1250.0000 mg | Freq: Once | INTRAVENOUS | Status: AC
  Administered 2022-10-09: 1250 mg via INTRAVENOUS
  Filled 2022-10-09: qty 250

## 2022-10-09 MED ORDER — HYDROMORPHONE HCL 1 MG/ML IJ SOLN
1.0000 mg | Freq: Once | INTRAMUSCULAR | Status: AC
  Administered 2022-10-09: 1 mg via INTRAVENOUS
  Filled 2022-10-09: qty 1

## 2022-10-09 MED ORDER — IOHEXOL 350 MG/ML SOLN
75.0000 mL | Freq: Once | INTRAVENOUS | Status: AC | PRN
  Administered 2022-10-09: 75 mL via INTRAVENOUS

## 2022-10-09 MED ORDER — LACTATED RINGERS IV BOLUS
1000.0000 mL | Freq: Once | INTRAVENOUS | Status: AC
  Administered 2022-10-09: 1000 mL via INTRAVENOUS

## 2022-10-09 MED ORDER — POTASSIUM CHLORIDE CRYS ER 20 MEQ PO TBCR
40.0000 meq | EXTENDED_RELEASE_TABLET | Freq: Once | ORAL | Status: AC
  Administered 2022-10-09: 40 meq via ORAL
  Filled 2022-10-09: qty 2

## 2022-10-09 NOTE — Progress Notes (Signed)
ANTICOAGULATION CONSULT NOTE - Initial Consult  Pharmacy Consult for Heparin Indication: pulmonary embolus  No Known Allergies  Patient Measurements: Height: 5\' 7"  (170.2 cm) Weight: 59 kg (130 lb) IBW/kg (Calculated) : 61.6 Heparin Dosing Weight: 59 kg  Vital Signs: Temp: 98.6 F (37 C) (04/30 1919) Temp Source: Oral (04/30 1919) BP: 140/66 (04/30 1919) Pulse Rate: 101 (04/30 1919)  Labs: Recent Labs    10/09/22 1708  HGB 9.1*  HCT 27.2*  PLT 409*  CREATININE 0.82  TROPONINIHS 8    Estimated Creatinine Clearance: 62.9 mL/min (by C-G formula based on SCr of 0.82 mg/dL).   Medical History: Past Medical History:  Diagnosis Date   Alcohol abuse    Anemia    Aortic atherosclerosis (HCC)    Cannabis abuse    Centrilobular emphysema (HCC)    Cervical myelopathy (HCC)    with BUE paresthesias   Hepatitis-C    HLD (hyperlipidemia)    HTN (hypertension)    Nicotine addiction    Tobacco abuse     Medications:  (Not in a hospital admission)  Scheduled:  Infusions:  PRN:   Assessment: 69 yof with a history of hepatitis C, EtOH use, cannabis use, tobacco use, HTN, cervical myelopathy.  Recent admission for C3 C4-C5-C6 evacuation of epidural abscess 07/28/2022 followed with IV antibiotics. Patient is presenting with abdominal pain. Heparin per pharmacy consult placed for pulmonary embolus.  Patient is not on anticoagulation prior to arrival.  Hgb 9.1; plt 409  Goal of Therapy:  Heparin level 0.3-0.7 units/ml Monitor platelets by anticoagulation protocol: Yes   Plan:  Give IV heparin 3500 units bolus x 1 Start heparin infusion at 1000 units/hr Check anti-Xa level in 8 hours and daily while on heparin Continue to monitor H&H and platelets  Delmar Landau, PharmD, BCPS 10/09/2022 8:55 PM ED Clinical Pharmacist -  [phone]

## 2022-10-09 NOTE — Telephone Encounter (Signed)
Spoke with patient's son, Kristen Bautista and patient. Patient reports chest and abdominal pain with deep breath. She also reports swelling in her feet. She does not weigh daily. Son had concerns that she maybe have side effects from a new medication, atorvastatin.   Upon further discussion it was discovered patient saw her surgeon yesterday and was advised to go the ED for evaluation  and testing. Patient do not go to the ED as directed and her son was not aware of this instruction prior to call.   Son is going to call Dr Mattie Marlin office for further guidance prior to taking patient to the ED at [facility].

## 2022-10-09 NOTE — ED Notes (Signed)
Pt ambulated to bedside commode and urinated

## 2022-10-09 NOTE — Progress Notes (Signed)
Pharmacy Antibiotic Note  Kristen Bautista is a 67 y.o. female for which pharmacy has been consulted for vancomycin dosing for sepsis.  Patient with a history of hepatitis C, EtOH use, cannabis use, tobacco use, HTN, cervical myelopathy. Recent admission for C3 C4-C5-C6 evacuation of epidural abscess 07/28/2022 followed with IV antibiotics and discharged with 1 month PO doxy/levaquin. Patient presenting with abdominal pain.  SCr 0.82 WBC 17; LA 0.9; T 99.3; HR 131>101; RR 28>23  Plan: Zosyn per MD Vancomycin 1250 mg q24hr (eAUC 520.1) unless change in renal function Trend WBC, Fever, Renal function F/u cultures, clinical course, WBC De-escalate when able     Temp (24hrs), Avg:98.9 F (37.2 C), Min:98.4 F (36.9 C), Max:99.3 F (37.4 C)  Recent Labs  Lab 10/09/22 1708  WBC 17.0*  CREATININE 0.82  LATICACIDVEN 0.9    Estimated Creatinine Clearance: 64.8 mL/min (by C-G formula based on SCr of 0.82 mg/dL).    No Known Allergies  Microbiology results: Pending  Thank you for allowing pharmacy to be a part of this patient's care.  Delmar Landau, PharmD, BCPS 10/09/2022 6:37 PM ED Clinical Pharmacist -  [phone]

## 2022-10-09 NOTE — Telephone Encounter (Signed)
Pt c/o swelling: STAT is pt has developed SOB within 24 hours  If swelling, where is the swelling located?   Both feet and right hand  How much weight have you gained and in what time span?   About 10 lbs since 3/14  Have you gained 3 pounds in a day or 5 pounds in a week?   No  Do you have a log of your daily weights (if so, list)?   No  Are you currently taking a fluid pill?  No  Are you currently SOB?   Yes  Have you traveled recently?  No  Son stated patient is also having abdominal pain.  Son stated when she breathes she's having pain in her stomach.  Other son's (Phil, in [place]) contact# [phone].

## 2022-10-09 NOTE — ED Notes (Signed)
Pt states she has pain that is just above her belly button but below her chest. She is also c/o bilateral foot swelling and right hand swelling that started after she took medications given to her post surgery.

## 2022-10-09 NOTE — ED Triage Notes (Signed)
Pt c/o epigastric/abdominal pain anytime she takes a deep breath. Pt states it has been going on since March 14th. States she is tired of the pain. Denies any n/v.

## 2022-10-09 NOTE — ED Provider Notes (Signed)
[place] [facility] AT [facility] Provider Note   CSN: [id number] Arrival date & time: 10/09/22  1445     History  Chief Complaint  Patient presents with   Abdominal Pain    Kristen Bautista is a 67 y.o. female.  With PMH of hepatitis C, EtOH use, cannabis use, tobacco use, HTN, cervical myelopathy who presents with epigastric and abdominal pain worse with deep breathing worsening over the past 2 weeks.  Patient just discharged from the hospital 08/23/2022 who underwent C3 C4-C5-C6 evacuation of epidural abscess 07/28/2022 followed with IV antibiotics.  Patient has finished her Levaquin and doxycycline.  She is mainly here for upper epigastric abdominal pain worse with breathing over the past 2 weeks.  She has had decreased p.o. intake as result trying to keep up with fluids and Ensure.  She has not been eating much and is still pooping and telling me she has darker poops than normal and no vomiting.  She has had no associated chest pain or shortness of breath or urinary symptoms.  Her pain medicines at home including oxycodone are not helping.  She is also complaining of increased swelling in her legs but feels like it is related to all the new medicines she is started as well as amlodipine.  She has no history of PE or DVT.  Only abdominal history was gynecologic procedure.  Still has her gallbladder.  Has had chills at home but no documented fevers.   Abdominal Pain      Home Medications Prior to Admission medications   Medication Sig Start Date End Date Taking? Authorizing Provider  amLODipine (NORVASC) 5 MG tablet Take 1 tablet (5 mg total) by mouth daily. 08/22/22  Yes Angiulli, Mcarthur Rossetti, PA-C  aspirin EC 81 MG tablet Take 1 tablet (81 mg total) by mouth daily. Swallow whole. 10/03/22  Yes Orbie Pyo, MD  atorvastatin (LIPITOR) 40 MG tablet Take 1 tablet (40 mg total) by mouth daily. 10/03/22  Yes Orbie Pyo, MD  Baclofen 5 MG TABS Take 1 tablet (5 mg  total) by mouth 3 (three) times daily. 08/22/22  Yes Angiulli, Mcarthur Rossetti, PA-C  gabapentin (NEURONTIN) 300 MG capsule Take 1 capsule (300 mg total) by mouth 2 (two) times daily. 08/22/22  Yes Angiulli, Mcarthur Rossetti, PA-C  methocarbamol (ROBAXIN) 500 MG tablet Take 1 tablet (500 mg total) by mouth every 6 (six) hours as needed for muscle spasms. 08/22/22  Yes Angiulli, Mcarthur Rossetti, PA-C  Oxycodone HCl 10 MG TABS Take 1 tablet (10 mg total) by mouth every 3 (three) hours as needed for severe pain ((score 7 to 10)). 08/22/22  Yes Angiulli, Mcarthur Rossetti, PA-C  acetaminophen (TYLENOL) 325 MG tablet Take 2 tablets (650 mg total) by mouth every 6 (six) hours as needed for mild pain (or Fever >/= 101). Patient not taking: Reported on 10/10/2022 08/01/22   Joseph Art, DO      Allergies    Patient has no known allergies.    Review of Systems   Review of Systems  Gastrointestinal:  Positive for abdominal pain.    Physical Exam Updated Vital Signs BP (!) 141/75   Pulse 99   Temp 97.6 F (36.4 C) (Oral)   Resp 17   Ht 5\' 7"  (1.702 m)   Wt 59 kg   SpO2 100%   BMI 20.36 kg/m  Physical Exam Constitutional: Alert and oriented.  Chronically ill-appearing Eyes: Conjunctivae are normal. ENT  Head: Normocephalic and atraumatic. Cardiovascular: Tachycardic, regular rhythm Respiratory: tachypneic mid to high 20s, clear breath sounds bilaterally, O2 sat 95 on RA Gastrointestinal: Soft and nondistended with diffuse tenderness worse in the epigastrium and right upper quadrant and left upper quadrant.  No rebound or guarding.  Not peritonitic Musculoskeletal: Tenderness to his right wrist pain with extension, overlying erythema and warmth. Nonpitting nontender equal edema bilateral lower shins Neurologic: Normal speech and language.  No facial droop.  Sensation grossly intact. Skin: Skin is warm, dry . Erythema warmth and edema of right hand and wrist  Psychiatric: Mood and affect are normal. Speech and behavior  are normal.  ED Results / Procedures / Treatments   Labs (all labs ordered are listed, but only abnormal results are displayed) Labs Reviewed  COMPREHENSIVE METABOLIC PANEL - Abnormal; Notable for the following components:      Result Value   Sodium 132 (*)    Potassium 3.3 (*)    CO2 21 (*)    Glucose, Bld 107 (*)    Albumin 2.7 (*)    All other components within normal limits  CBC - Abnormal; Notable for the following components:   WBC 17.0 (*)    RBC 3.26 (*)    Hemoglobin 9.1 (*)    HCT 27.2 (*)    Platelets 409 (*)    All other components within normal limits  URINALYSIS, ROUTINE W REFLEX MICROSCOPIC - Abnormal; Notable for the following components:   Hgb urine dipstick SMALL (*)    Protein, ur 30 (*)    Bacteria, UA RARE (*)    All other components within normal limits  CULTURE, BLOOD (ROUTINE X 2)  CULTURE, BLOOD (ROUTINE X 2)  LIPASE, BLOOD  LACTIC ACID, PLASMA  LACTIC ACID, PLASMA  BRAIN NATRIURETIC PEPTIDE  HEPARIN LEVEL (UNFRACTIONATED)  TROPONIN I (HIGH SENSITIVITY)  TROPONIN I (HIGH SENSITIVITY)    EKG EKG Interpretation  Date/Time:  Tuesday October 09 2022 14:52:08 EDT Ventricular Rate:  126 PR Interval:  154 QRS Duration: 64 QT Interval:  300 QTC Calculation: 434 R Axis:   53 Text Interpretation: Sinus tachycardia Left ventricular hypertrophy with repolarization abnormality ( Sokolow-Lyon ) Abnormal ECG When compared with ECG of 31-Aug-2013 11:01, PREVIOUS ECG IS PRESENT Confirmed by Vivien Rossetti ([id number]) on 10/09/2022 4:27:07 PM  Radiology US Abdomen Limited RUQ (LIVER/GB)  Result Date: 10/09/2022 CLINICAL DATA:  Right upper quadrant pain EXAM: ULTRASOUND ABDOMEN LIMITED RIGHT UPPER QUADRANT COMPARISON:  CT from earlier in the same day. FINDINGS: Gallbladder: Gallbladder is well distended. Multiple gallstones are noted within. No wall thickening or pericholecystic fluid is noted. Negative sonographic Murphy's sign is elicited. Common bile duct:  Diameter: 3.3 mm. Liver: Mild nodularity is noted similar to that seen on the prior CT examination. No focal mass is noted. Portal vein is patent on color Doppler imaging with normal direction of blood flow towards the liver. Other: None. IMPRESSION: Cholelithiasis without complicating factors. Changes suspicious for early cirrhosis. Electronically Signed   By: Alcide Clever M.D.   On: 10/09/2022 21:50   CT Angio Chest PE W and/or Wo Contrast  Result Date: 10/09/2022 CLINICAL DATA:  Pleurisy, abdominal pain, vomiting. EXAM: CT ANGIOGRAPHY CHEST CT ABDOMEN AND PELVIS WITH CONTRAST TECHNIQUE: Multidetector CT imaging of the chest was performed using the standard protocol during bolus administration of intravenous contrast. Multiplanar CT image reconstructions and MIPs were obtained to evaluate the vascular anatomy. Multidetector CT imaging of the abdomen and pelvis was performed using the standard protocol  during bolus administration of intravenous contrast. RADIATION DOSE REDUCTION: This exam was performed according to the departmental dose-optimization program which includes automated exposure control, adjustment of the mA and/or kV according to patient size and/or use of iterative reconstruction technique. CONTRAST:  75mL OMNIPAQUE IOHEXOL 350 MG/ML SOLN COMPARISON:  None Available. FINDINGS: CTA CHEST FINDINGS Cardiovascular: Mild cardiomegaly. No pericardial effusion. No aortic aneurysm or dissection. Atheromatous calcifications of the aorta and coronary arteries. No RV enlargement or reflux of contrast to the IVC to suggest insufficiency of the tricuspid or right heart strain. Filling defects identified in left lower lobe branch pulmonary arteries consistent with PE. Mediastinum/Nodes: No enlarged mediastinal, hilar, or axillary lymph nodes. Thyroid gland, trachea, and esophagus demonstrate no significant findings. Lungs/Pleura: Moderate left-sided pleural effusion. Bibasilar subsegmental atelectasis. No  pneumothorax. Consolidation bilateral lower lobes. Musculoskeletal: No chest wall abnormality. No acute or significant osseous findings. Review of the MIP images confirms the above findings. CT ABDOMEN and PELVIS FINDINGS Hepatobiliary: Calcified gallstone. No hepatic parenchymal lesions or biliary ductal dilatation. Gallbladder demonstrates a somewhat nodular configuration suggestive of cirrhosis, and clinical correlation recommended. Pancreas: Unremarkable. No pancreatic ductal dilatation or surrounding inflammatory changes. Spleen: Normal in size without focal abnormality. Adrenals/Urinary Tract: Adrenal glands are unremarkable. Kidneys are normal, without renal calculi, focal lesion, or hydronephrosis. There are bilateral extrarenal pelves. Bladder is unremarkable. Stomach/Bowel: Stomach is within normal limits. Appendix appears normal. No evidence of bowel wall thickening, distention, or inflammatory changes. Vascular/Lymphatic: Aortic atherosclerosis. No enlarged abdominal or pelvic lymph nodes. Reproductive: Uterus and bilateral adnexa are unremarkable. Other: No abdominal wall hernia or abnormality. No abdominopelvic ascites. Musculoskeletal: No acute or significant osseous findings. L4-5 degenerative disc disease with vacuum disc changes. Review of the MIP images confirms the above findings. IMPRESSION: 1. Left lower lobe pulmonary emboli. 2. Left-sided pleural effusion. 3. Bibasilar consolidation and volume loss. 4. Cholelithiasis. Findings were discussed with and acknowledged by Dr. Elpidio Anis at 8:50 p.m. 10/09/2022. Electronically Signed   By: Layla Maw M.D.   On: 10/09/2022 20:55   CT ABDOMEN PELVIS W CONTRAST  Result Date: 10/09/2022 CLINICAL DATA:  Pleurisy, abdominal pain, vomiting. EXAM: CT ANGIOGRAPHY CHEST CT ABDOMEN AND PELVIS WITH CONTRAST TECHNIQUE: Multidetector CT imaging of the chest was performed using the standard protocol during bolus administration of intravenous contrast.  Multiplanar CT image reconstructions and MIPs were obtained to evaluate the vascular anatomy. Multidetector CT imaging of the abdomen and pelvis was performed using the standard protocol during bolus administration of intravenous contrast. RADIATION DOSE REDUCTION: This exam was performed according to the departmental dose-optimization program which includes automated exposure control, adjustment of the mA and/or kV according to patient size and/or use of iterative reconstruction technique. CONTRAST:  75mL OMNIPAQUE IOHEXOL 350 MG/ML SOLN COMPARISON:  None Available. FINDINGS: CTA CHEST FINDINGS Cardiovascular: Mild cardiomegaly. No pericardial effusion. No aortic aneurysm or dissection. Atheromatous calcifications of the aorta and coronary arteries. No RV enlargement or reflux of contrast to the IVC to suggest insufficiency of the tricuspid or right heart strain. Filling defects identified in left lower lobe branch pulmonary arteries consistent with PE. Mediastinum/Nodes: No enlarged mediastinal, hilar, or axillary lymph nodes. Thyroid gland, trachea, and esophagus demonstrate no significant findings. Lungs/Pleura: Moderate left-sided pleural effusion. Bibasilar subsegmental atelectasis. No pneumothorax. Consolidation bilateral lower lobes. Musculoskeletal: No chest wall abnormality. No acute or significant osseous findings. Review of the MIP images confirms the above findings. CT ABDOMEN and PELVIS FINDINGS Hepatobiliary: Calcified gallstone. No hepatic parenchymal lesions or biliary ductal dilatation. Gallbladder demonstrates  a somewhat nodular configuration suggestive of cirrhosis, and clinical correlation recommended. Pancreas: Unremarkable. No pancreatic ductal dilatation or surrounding inflammatory changes. Spleen: Normal in size without focal abnormality. Adrenals/Urinary Tract: Adrenal glands are unremarkable. Kidneys are normal, without renal calculi, focal lesion, or hydronephrosis. There are bilateral  extrarenal pelves. Bladder is unremarkable. Stomach/Bowel: Stomach is within normal limits. Appendix appears normal. No evidence of bowel wall thickening, distention, or inflammatory changes. Vascular/Lymphatic: Aortic atherosclerosis. No enlarged abdominal or pelvic lymph nodes. Reproductive: Uterus and bilateral adnexa are unremarkable. Other: No abdominal wall hernia or abnormality. No abdominopelvic ascites. Musculoskeletal: No acute or significant osseous findings. L4-5 degenerative disc disease with vacuum disc changes. Review of the MIP images confirms the above findings. IMPRESSION: 1. Left lower lobe pulmonary emboli. 2. Left-sided pleural effusion. 3. Bibasilar consolidation and volume loss. 4. Cholelithiasis. Findings were discussed with and acknowledged by Dr. Elpidio Anis at 8:50 p.m. 10/09/2022. Electronically Signed   By: Layla Maw M.D.   On: 10/09/2022 20:55   DG Wrist Complete Right  Result Date: 10/09/2022 CLINICAL DATA:  Right wrist pain and swelling. EXAM: RIGHT WRIST - COMPLETE 3+ VIEW COMPARISON:  March 18, 2021 FINDINGS: There is no evidence of an acute fracture or dislocation. Mild degenerative changes are seen involving the distal aspect of the right wrist. Mild, stable degenerative changes are also seen along the ulnocarpal joint. Mild dorsal soft tissue swelling is noted. IMPRESSION: Mild degenerative changes without an acute osseous abnormality. Electronically Signed   By: Aram Candela M.D.   On: 10/09/2022 18:44    Procedures .Critical Care  Performed by: Mardene Sayer, MD Authorized by: Mardene Sayer, MD   Critical care provider statement:    Critical care time (minutes):  45   Critical care was necessary to treat or prevent imminent or life-threatening deterioration of the following conditions:  Sepsis and respiratory failure   Critical care was time spent personally by me on the following activities:  Development of treatment plan with patient or  surrogate, evaluation of patient's response to treatment, examination of patient, ordering and review of laboratory studies, ordering and review of radiographic studies, ordering and performing treatments and interventions, pulse oximetry, re-evaluation of patient's condition, review of old charts and obtaining history from patient or surrogate   Care discussed with: admitting provider       Medications Ordered in ED Medications  heparin ADULT infusion 100 units/mL (25000 units/264mL) (1,000 Units/hr Intravenous New Bag/Given 10/09/22 2215)  vancomycin (VANCOREADY) IVPB 1250 mg/250 mL (has no administration in time range)  HYDROmorphone (DILAUDID) injection 1 mg (1 mg Intravenous Given 10/09/22 1712)  ondansetron (ZOFRAN) injection 4 mg (4 mg Intravenous Given 10/09/22 1712)  lactated ringers bolus 1,000 mL (0 mLs Intravenous Stopped 10/09/22 2155)  piperacillin-tazobactam (ZOSYN) IVPB 3.375 g (0 g Intravenous Stopped 10/09/22 1858)  vancomycin (VANCOREADY) IVPB 1250 mg/250 mL (0 mg Intravenous Stopped 10/09/22 2210)  iohexol (OMNIPAQUE) 350 MG/ML injection 75 mL (75 mLs Intravenous Contrast Given 10/09/22 2042)  potassium chloride SA (KLOR-CON M) CR tablet 40 mEq (40 mEq Oral Given 10/09/22 2148)  heparin bolus via infusion 3,500 Units (3,500 Units Intravenous Bolus from Bag 10/09/22 2215)    ED Course/ Medical Decision Making/ A&P Clinical Course as of 10/10/22 0131  Tue Oct 09, 2022  2054 Called by radiology due to new distal PE.  No evidence of heart strain. [VB]  2236 D/w dr Loney Loh for admission. [VB]    Clinical Course User Index [VB] Mardene Sayer, MD  Medical Decision Making LOLAH COGHLAN is a 67 y.o. female.  With PMH of hepatitis C, EtOH use, cannabis use, tobacco use, HTN, cervical myelopathy who presents with epigastric and abdominal pain worse with deep breathing worsening over the past 2 weeks.  Patient presented tachycardic to 130s with stable  blood pressure 159/90 and tachypneic to the mid 20s.  O2 sat dropped to 88% started on new 2 L nasal cannula.  With patient's SIRS criteria, sepsis workup initiated suspected of possible pneumonia or intra-abdominal source such as cholecystitis.  Her right hand and distal upper extremity were very erythematous, warm and edematous concerning for a possible cellulitis thus started on broad-spectrum antibiotics.  Could have underlying septic joint however x-ray did not show any joint effusion.  Also considered PE especially with recent hospitalization, increased work of breathing tachycardia and hypoxia.  Labs reviewed by me notable for leukocytosis 17 with left shift chronic normocytic anemia hemoglobin 9.1.  Troponin 8 and reassuring with no acute changes on EKG concerning for ACS.  Lipase within normal limits not concern for pancreatitis.  No transaminitis and normal total bilirubin.  CTA PE study showed evidence of left distal PE with no heart strain.  Additionally had cholelithiasis without cholecystitis confirmed on right upper quadrant ultrasound.  Patient discussed with hospitalist Dr. Loney Loh for continued management of cellulitis as well as new PE.  Amount and/or Complexity of Data Reviewed Labs: ordered. Radiology: ordered.  Risk Prescription drug management. Decision regarding hospitalization.      Final Clinical Impression(s) / ED Diagnoses Final diagnoses:  Cellulitis of right upper extremity  SIRS (systemic inflammatory response syndrome) (HCC)  Other acute pulmonary embolism without acute cor pulmonale (HCC)    Rx / DC Orders ED Discharge Orders     None         Mardene Sayer, MD 10/10/22 0131

## 2022-10-10 ENCOUNTER — Telehealth (HOSPITAL_COMMUNITY): Payer: Self-pay | Admitting: Pharmacy Technician

## 2022-10-10 ENCOUNTER — Inpatient Hospital Stay (HOSPITAL_COMMUNITY): Payer: No Typology Code available for payment source

## 2022-10-10 ENCOUNTER — Other Ambulatory Visit (HOSPITAL_COMMUNITY): Payer: Self-pay

## 2022-10-10 DIAGNOSIS — I2510 Atherosclerotic heart disease of native coronary artery without angina pectoris: Secondary | ICD-10-CM | POA: Diagnosis not present

## 2022-10-10 DIAGNOSIS — R0902 Hypoxemia: Secondary | ICD-10-CM | POA: Diagnosis not present

## 2022-10-10 DIAGNOSIS — J432 Centrilobular emphysema: Secondary | ICD-10-CM | POA: Diagnosis not present

## 2022-10-10 DIAGNOSIS — E8809 Other disorders of plasma-protein metabolism, not elsewhere classified: Secondary | ICD-10-CM | POA: Diagnosis not present

## 2022-10-10 DIAGNOSIS — F1210 Cannabis abuse, uncomplicated: Secondary | ICD-10-CM | POA: Diagnosis not present

## 2022-10-10 DIAGNOSIS — E785 Hyperlipidemia, unspecified: Secondary | ICD-10-CM | POA: Diagnosis not present

## 2022-10-10 DIAGNOSIS — F17210 Nicotine dependence, cigarettes, uncomplicated: Secondary | ICD-10-CM | POA: Diagnosis not present

## 2022-10-10 DIAGNOSIS — F1010 Alcohol abuse, uncomplicated: Secondary | ICD-10-CM | POA: Diagnosis not present

## 2022-10-10 DIAGNOSIS — I700 Atherosclerosis of aorta: Secondary | ICD-10-CM | POA: Diagnosis not present

## 2022-10-10 DIAGNOSIS — Z1152 Encounter for screening for COVID-19: Secondary | ICD-10-CM | POA: Diagnosis not present

## 2022-10-10 DIAGNOSIS — J90 Pleural effusion, not elsewhere classified: Secondary | ICD-10-CM | POA: Diagnosis not present

## 2022-10-10 DIAGNOSIS — Y95 Nosocomial condition: Secondary | ICD-10-CM | POA: Diagnosis present

## 2022-10-10 DIAGNOSIS — D72829 Elevated white blood cell count, unspecified: Secondary | ICD-10-CM | POA: Diagnosis not present

## 2022-10-10 DIAGNOSIS — M7989 Other specified soft tissue disorders: Secondary | ICD-10-CM | POA: Diagnosis not present

## 2022-10-10 DIAGNOSIS — J9811 Atelectasis: Secondary | ICD-10-CM | POA: Diagnosis not present

## 2022-10-10 DIAGNOSIS — R6510 Systemic inflammatory response syndrome (SIRS) of non-infectious origin without acute organ dysfunction: Secondary | ICD-10-CM | POA: Diagnosis not present

## 2022-10-10 DIAGNOSIS — Z7982 Long term (current) use of aspirin: Secondary | ICD-10-CM | POA: Diagnosis not present

## 2022-10-10 DIAGNOSIS — K8020 Calculus of gallbladder without cholecystitis without obstruction: Secondary | ICD-10-CM | POA: Diagnosis not present

## 2022-10-10 DIAGNOSIS — I2699 Other pulmonary embolism without acute cor pulmonale: Secondary | ICD-10-CM | POA: Diagnosis present

## 2022-10-10 DIAGNOSIS — Z86711 Personal history of pulmonary embolism: Secondary | ICD-10-CM

## 2022-10-10 DIAGNOSIS — I10 Essential (primary) hypertension: Secondary | ICD-10-CM

## 2022-10-10 DIAGNOSIS — J189 Pneumonia, unspecified organism: Secondary | ICD-10-CM

## 2022-10-10 DIAGNOSIS — E876 Hypokalemia: Secondary | ICD-10-CM | POA: Diagnosis not present

## 2022-10-10 DIAGNOSIS — K7460 Unspecified cirrhosis of liver: Secondary | ICD-10-CM | POA: Diagnosis not present

## 2022-10-10 DIAGNOSIS — L03113 Cellulitis of right upper limb: Secondary | ICD-10-CM | POA: Diagnosis not present

## 2022-10-10 DIAGNOSIS — I2602 Saddle embolus of pulmonary artery with acute cor pulmonale: Secondary | ICD-10-CM

## 2022-10-10 DIAGNOSIS — D638 Anemia in other chronic diseases classified elsewhere: Secondary | ICD-10-CM | POA: Diagnosis not present

## 2022-10-10 DIAGNOSIS — Z66 Do not resuscitate: Secondary | ICD-10-CM | POA: Diagnosis not present

## 2022-10-10 DIAGNOSIS — E871 Hypo-osmolality and hyponatremia: Secondary | ICD-10-CM | POA: Diagnosis not present

## 2022-10-10 LAB — CBC
HCT: 23.9 % — ABNORMAL LOW (ref 36.0–46.0)
Hemoglobin: 8.2 g/dL — ABNORMAL LOW (ref 12.0–15.0)
MCH: 27.8 pg (ref 26.0–34.0)
MCHC: 34.3 g/dL (ref 30.0–36.0)
MCV: 81 fL (ref 80.0–100.0)
Platelets: 384 10*3/uL (ref 150–400)
RBC: 2.95 MIL/uL — ABNORMAL LOW (ref 3.87–5.11)
RDW: 13.6 % (ref 11.5–15.5)
WBC: 14.6 10*3/uL — ABNORMAL HIGH (ref 4.0–10.5)
nRBC: 0 % (ref 0.0–0.2)

## 2022-10-10 LAB — BASIC METABOLIC PANEL
Anion gap: 9 (ref 5–15)
BUN: 15 mg/dL (ref 8–23)
CO2: 23 mmol/L (ref 22–32)
Calcium: 8.4 mg/dL — ABNORMAL LOW (ref 8.9–10.3)
Chloride: 103 mmol/L (ref 98–111)
Creatinine, Ser: 0.84 mg/dL (ref 0.44–1.00)
GFR, Estimated: >60 mL/min (ref >60)
Glucose, Bld: 88 mg/dL (ref 70–99)
Potassium: 3 mmol/L — ABNORMAL LOW (ref 3.5–5.1)
Sodium: 135 mmol/L (ref 135–145)

## 2022-10-10 LAB — CULTURE, BLOOD (ROUTINE X 2): Culture: NO GROWTH

## 2022-10-10 LAB — ECHOCARDIOGRAM COMPLETE
Area-P 1/2: 4.74 cm2
Calc EF: 49.3 %
Height: 67 in
S' Lateral: 3.7 cm
Single Plane A2C EF: 52.7 %
Single Plane A4C EF: 49.1 %
Weight: 2089.6 oz

## 2022-10-10 LAB — HEPARIN LEVEL (UNFRACTIONATED)
Heparin Unfractionated: 0.12 IU/mL — ABNORMAL LOW (ref 0.30–0.70)
Heparin Unfractionated: 0.45 IU/mL (ref 0.30–0.70)
Heparin Unfractionated: 0.25 IU/mL — ABNORMAL LOW (ref 0.30–0.70)

## 2022-10-10 LAB — TROPONIN I (HIGH SENSITIVITY): Troponin I (High Sensitivity): 9 ng/L (ref <18)

## 2022-10-10 LAB — MAGNESIUM: Magnesium: 1.6 mg/dL — ABNORMAL LOW (ref 1.7–2.4)

## 2022-10-10 LAB — SARS CORONAVIRUS 2 BY RT PCR: SARS Coronavirus 2 by RT PCR: NEGATIVE

## 2022-10-10 MED ORDER — HEPARIN BOLUS VIA INFUSION
2000.0000 [IU] | Freq: Once | INTRAVENOUS | Status: AC
  Administered 2022-10-10: 2000 [IU] via INTRAVENOUS
  Filled 2022-10-10: qty 2000

## 2022-10-10 MED ORDER — POTASSIUM CHLORIDE CRYS ER 20 MEQ PO TBCR
40.0000 meq | EXTENDED_RELEASE_TABLET | Freq: Once | ORAL | Status: DC
  Filled 2022-10-10: qty 2

## 2022-10-10 MED ORDER — ACETAMINOPHEN 325 MG PO TABS
650.0000 mg | ORAL_TABLET | Freq: Four times a day (QID) | ORAL | Status: DC | PRN
  Administered 2022-10-10 (×2): 650 mg via ORAL
  Filled 2022-10-10 (×2): qty 2

## 2022-10-10 MED ORDER — HEPARIN (PORCINE) 25000 UT/250ML-% IV SOLN
1400.0000 [IU]/h | INTRAVENOUS | Status: DC
  Administered 2022-10-10 – 2022-10-12 (×3): 1400 [IU]/h via INTRAVENOUS
  Filled 2022-10-10 (×2): qty 250

## 2022-10-10 MED ORDER — MAGNESIUM OXIDE -MG SUPPLEMENT 400 (240 MG) MG PO TABS
400.0000 mg | ORAL_TABLET | Freq: Two times a day (BID) | ORAL | Status: AC
  Administered 2022-10-10 – 2022-10-12 (×6): 400 mg via ORAL
  Filled 2022-10-10 (×6): qty 1

## 2022-10-10 MED ORDER — THIAMINE MONONITRATE 100 MG PO TABS
100.0000 mg | ORAL_TABLET | Freq: Every day | ORAL | Status: DC
  Administered 2022-10-10 – 2022-10-13 (×4): 100 mg via ORAL
  Filled 2022-10-10 (×4): qty 1

## 2022-10-10 MED ORDER — ATORVASTATIN CALCIUM 40 MG PO TABS
40.0000 mg | ORAL_TABLET | Freq: Every day | ORAL | Status: DC
  Administered 2022-10-10 – 2022-10-13 (×4): 40 mg via ORAL
  Filled 2022-10-10 (×4): qty 1

## 2022-10-10 MED ORDER — LORAZEPAM 2 MG/ML IJ SOLN: 1.0000 mg | INTRAMUSCULAR | Status: AC | PRN

## 2022-10-10 MED ORDER — FOLIC ACID 1 MG PO TABS
1.0000 mg | ORAL_TABLET | Freq: Every day | ORAL | Status: DC
  Administered 2022-10-10 – 2022-10-13 (×4): 1 mg via ORAL
  Filled 2022-10-10 (×3): qty 1

## 2022-10-10 MED ORDER — LORAZEPAM 1 MG PO TABS: 1.0000 mg | ORAL_TABLET | ORAL | Status: AC | PRN

## 2022-10-10 MED ORDER — ACETAMINOPHEN 650 MG RE SUPP: 650.0000 mg | Freq: Four times a day (QID) | RECTAL | Status: DC | PRN

## 2022-10-10 MED ORDER — ADULT MULTIVITAMIN W/MINERALS CH
1.0000 | ORAL_TABLET | Freq: Every day | ORAL | Status: DC
  Administered 2022-10-10 – 2022-10-13 (×4): 1 via ORAL
  Filled 2022-10-10 (×4): qty 1

## 2022-10-10 MED ORDER — AMLODIPINE BESYLATE 5 MG PO TABS
5.0000 mg | ORAL_TABLET | Freq: Every day | ORAL | Status: DC
  Administered 2022-10-10 – 2022-10-11 (×2): 5 mg via ORAL
  Filled 2022-10-10 (×3): qty 1

## 2022-10-10 MED ORDER — METHOCARBAMOL 500 MG PO TABS
500.0000 mg | ORAL_TABLET | Freq: Four times a day (QID) | ORAL | Status: DC | PRN
  Administered 2022-10-10: 500 mg via ORAL
  Filled 2022-10-10: qty 1

## 2022-10-10 MED ORDER — SODIUM CHLORIDE 0.9 % IV SOLN
2.0000 g | Freq: Three times a day (TID) | INTRAVENOUS | Status: DC
  Administered 2022-10-10 – 2022-10-12 (×6): 2 g via INTRAVENOUS
  Filled 2022-10-10 (×6): qty 12.5

## 2022-10-10 MED ORDER — POTASSIUM CHLORIDE 20 MEQ PO PACK
40.0000 meq | PACK | Freq: Once | ORAL | Status: AC
  Administered 2022-10-10: 40 meq via ORAL
  Filled 2022-10-10: qty 2

## 2022-10-10 MED ORDER — SODIUM CHLORIDE 0.9 % IV SOLN
2.0000 g | Freq: Once | INTRAVENOUS | Status: AC
  Administered 2022-10-10: 2 g via INTRAVENOUS
  Filled 2022-10-10: qty 12.5

## 2022-10-10 MED ORDER — THIAMINE HCL 100 MG/ML IJ SOLN
100.0000 mg | Freq: Every day | INTRAMUSCULAR | Status: DC
  Filled 2022-10-10 (×2): qty 2

## 2022-10-10 NOTE — TOC Benefit Eligibility Note (Signed)
Patient Product/process development scientist completed.    The patient is currently admitted and upon discharge could be taking Eliquis 5 mg.  The current 30 day co-pay is $433.84 due to a $395.00 deductible.  Will be $47.00 once deductible  The patient is currently admitted and upon discharge could be taking Xarelto 20 mg.  The current 30 day co-pay is $433.84 due to a $395.00 deductible.  Will be $47.00 once deductible    The patient is insured through [employer] Part D   This test claim was processed through [facility]- copay amounts may vary at other pharmacies due to pharmacy/plan contracts, or as the patient moves through the different stages of their insurance plan.  Roland Earl, CPHT Pharmacy Patient Advocate Specialist [facility] Patient Advocate Team Direct Number: [phone]  Fax: [phone]

## 2022-10-10 NOTE — Telephone Encounter (Signed)
Pharmacy Patient Advocate Encounter  Insurance verification completed.    The patient is insured through [employer] part D.   The patient is currently admitted and ran test claims for the following: Eliquis, Xarelto  Copays and coinsurance results were relayed to Inpatient clinical team.

## 2022-10-10 NOTE — Progress Notes (Signed)
Pharmacy Antibiotic Note  Kristen Bautista is a 67 y.o. female admitted on 10/09/2022 with pneumonia.  Pharmacy has been consulted for cefepime dosing.  Plan: Cefepime 2g IV Q8H.  Height: 5\' 7"  (170.2 cm) Weight: 59 kg (130 lb) IBW/kg (Calculated) : 61.6  Temp (24hrs), Avg:98.5 F (36.9 C), Min:97.6 F (36.4 C), Max:99.3 F (37.4 C)  Recent Labs  Lab 10/09/22 1708 10/09/22 1914  WBC 17.0*  --   CREATININE 0.82  --   LATICACIDVEN 0.9 1.2    Estimated Creatinine Clearance: 62.9 mL/min (by C-G formula based on SCr of 0.82 mg/dL).    No Known Allergies  Thank you for allowing pharmacy to be a part of this patient's care.  Vernard Gambles, PharmD, BCPS  10/10/2022 2:09 AM

## 2022-10-10 NOTE — TOC Initial Note (Signed)
Transition of Care [facility]) - Initial/Assessment Note    Patient Details  Name: Kristen Bautista MRN: [id number] Date of Birth: 11/13/1955  Transition of [facility]) CM/SW Contact:    Delilah Shan, LCSWA Phone Number: 10/10/2022, 9:58 AM  Clinical Narrative:                  CSW received consult for substance use resources for patient. CSW spoke with patient who reports PTA she comes from home with cousin Kristen Bautista. CSW offered patient outpatient substance use treatment services resources. Patient politely declined. Patient reports when ready for dc that her cousin Kristen Bautista will be picking her up. All questions answered. No further questions reported at this time.       Patient Goals and CMS Choice            Expected Discharge Plan and Services                                              Prior Living Arrangements/Services                       Activities of Daily Living Home Assistive Devices/Equipment: Dan Humphreys (specify type) ADL Screening (condition at time of admission) Patient's cognitive ability adequate to safely complete daily activities?: Yes Is the patient deaf or have difficulty hearing?: No Does the patient have difficulty seeing, even when wearing glasses/contacts?: No Does the patient have difficulty concentrating, remembering, or making decisions?: No Patient able to express need for assistance with ADLs?: Yes Does the patient have difficulty dressing or bathing?: No Independently performs ADLs?: Yes (appropriate for developmental age) Does the patient have difficulty walking or climbing stairs?: No Weakness of Legs: Both Weakness of Arms/Hands: Both  Permission Sought/Granted                  Emotional Assessment              Admission diagnosis:  SIRS (systemic inflammatory response syndrome) (HCC) [R65.10] Acute pulmonary embolism (HCC) [I26.99] Cellulitis of right upper extremity [L03.113] Other acute pulmonary embolism  without acute cor pulmonale (HCC) [I26.99] Patient Active Problem List   Diagnosis Date Noted   Acute pulmonary embolism (HCC) 10/10/2022   HCAP (healthcare-associated pneumonia) 10/10/2022   Pleural effusion 10/10/2022   HTN (hypertension) 10/10/2022   HLD (hyperlipidemia) 10/10/2022   Alcohol abuse 10/10/2022   Substance use disorder 08/10/2022   History of substance use 08/09/2022   Cervical stenosis of spine 07/30/2022   Epidural abscess 07/30/2022   Acute renal failure (HCC) 07/30/2022   Moderate malnutrition (HCC) 07/28/2022   Cervical myelopathy (HCC) 07/26/2022   DNR (do not resuscitate)/DNI(Do Not Intubate) 07/26/2022   PCP:  Ollen Bowl, MD Pharmacy:   [facility] [address] [place] [address] Phone: [phone] Fax: [phone]  [id number] - [place], [place] - [address] AT [facility] [address] [place] [address] Phone: [phone] Fax: [phone]  [facility] [address] [place] [address] Phone: [phone] Fax: [phone]     Social Determinants of Health (SDOH) Social History: SDOH Screenings   Food Insecurity: No Food Insecurity (10/10/2022)  Housing: Low Risk  (10/10/2022)  Transportation Needs: No Transportation Needs (10/10/2022)  Utilities: Not At Risk (10/10/2022)  Tobacco Use: High  Risk (10/09/2022)   SDOH Interventions:     Readmission Risk Interventions     No data to display

## 2022-10-10 NOTE — Progress Notes (Signed)
ANTICOAGULATION CONSULT NOTE Pharmacy Consult for Heparin Indication: pulmonary embolus   No Known Allergies  Patient Measurements: Height: 5\' 7"  (170.2 cm) Weight: 59.2 kg (130 lb 9.6 oz) IBW/kg (Calculated) : 61.6 Heparin Dosing Weight: 59 kg  Vital Signs: Temp: 98.4 F (36.9 C) (05/01 2023) Temp Source: Oral (05/01 2023) BP: 128/60 (05/01 1600) Pulse Rate: 116 (05/01 1636)  Labs: Recent Labs    10/09/22 1708 10/10/22 0410 10/10/22 1202 10/10/22 1815  HGB 9.1* 8.2*  --   --   HCT 27.2* 23.9*  --   --   PLT 409* 384  --   --   HEPARINUNFRC  --  0.12* 0.45 0.25*  CREATININE 0.82 0.84  --   --   TROPONINIHS 8 9  --   --      Estimated Creatinine Clearance: 61.6 mL/min (by C-G formula based on SCr of 0.84 mg/dL).   Assessment: 67 y.o. female with PE on  heparin -heparin level subtherapeutic at 0.25 on 1250 units/hr, verified with RN that drip is running continuously without pauses or interruptions -no bleeding reported  Goal of Therapy:  Heparin level 0.3-0.7 units/ml Monitor platelets by anticoagulation protocol: Yes   Plan:  -Increase heparin infusion to 1400 units/hr -Recheck heparin level with AM labs   Loralee Pacas, PharmD, BCPS Please see amion for complete clinical pharmacist phone list 10/10/2022 8:35 PM

## 2022-10-10 NOTE — Progress Notes (Signed)
ANTICOAGULATION CONSULT NOTE Pharmacy Consult for Heparin Indication: pulmonary embolus   No Known Allergies  Patient Measurements: Height: 5\' 7"  (170.2 cm) Weight: 59.2 kg (130 lb 9.6 oz) IBW/kg (Calculated) : 61.6 Heparin Dosing Weight: 59 kg  Vital Signs: Temp: 97.7 F (36.5 C) (05/01 1610) Temp Source: Other (Comment) (05/01 0938) BP: 132/64 (05/01 9604) Pulse Rate: 97 (05/01 1000)  Labs: Recent Labs    10/09/22 1708 10/10/22 0410 10/10/22 1202  HGB 9.1* 8.2*  --   HCT 27.2* 23.9*  --   PLT 409* 384  --   HEPARINUNFRC  --  0.12* 0.45  CREATININE 0.82 0.84  --   TROPONINIHS 8 9  --      Estimated Creatinine Clearance: 61.6 mL/min (by C-G formula based on SCr of 0.84 mg/dL).   Assessment: 67 y.o. female with PE on  heparin -heparin level at goal on 1250 units/hr -cost of apixaban is $47 per month after her deductible is met  Goal of Therapy:  Heparin level 0.3-0.7 units/ml Monitor platelets by anticoagulation protocol: Yes   Plan:  -Continue heparin at 1250 units/hr -Will confirm heparin level later today  Harland German, PharmD Clinical Pharmacist **Pharmacist phone directory can now be found on [email or web address] ([email or web address]).  Listed under [facility].

## 2022-10-10 NOTE — Progress Notes (Signed)
Echocardiogram 2D Echocardiogram has been performed.  Toni Amend 10/10/2022, 10:43 AM

## 2022-10-10 NOTE — Progress Notes (Signed)
Lower extremity venous and upper extremity venous study completed.   Please see CV Proc for preliminary results.   Rossie Bretado, RDMS, RVT  

## 2022-10-10 NOTE — H&P (Signed)
History and Physical    EMRYS MCEACHRON VHQ:[id number] DOB: Oct 14, 1955 DOA: 10/09/2022  PCP: Ollen Bowl, MD  Patient coming from: Home  Chief Complaint: Upper abdominal pain  HPI: Kristen Bautista is a 67 y.o. female with medical history significant of aortic atherosclerosis, coronary artery calcification, hepatitis C, hypertension, hyperlipidemia, tobacco abuse, alcohol abuse, cannabis abuse.  She was admitted to the hospital on 08/01/2022 for C3-C6 severe cervical stenosis with myelopathy concerning for epidural abscess.  She was treated with IV antibiotics and underwent open posterior cervical lateral mass instrumentation posterior lateral fusion C3-4 4-5 and C5-6 as well as laminotomies evacuation of epidural abscess 07/28/2022.  She was discharged to inpatient rehab and stayed there until 3/14.  Discharged on oral antibiotics (Levaquin and doxycycline) for additional 30 days per ID recommendation.  Patient presents to the ED today complaining of epigastric abdominal pain worse with breathing over the past 2 weeks and bilateral lower extremity edema.  Patient tachycardic and tachypneic on arrival to the ED.  Afebrile, not hypotensive.  Oxygen saturation in the low 90s and placed on 2 L supplemental oxygen.  Labs showing WBC 17.0, hemoglobin 9.1 (at baseline), platelet count 409k, sodium 132, potassium 3.3, chloride 98, bicarb 21, BUN 15, creatinine 0.8, glucose 107, albumin 2.7, normal lipase and LFTs, troponin negative, lactic acid normal x 2, BNP normal, UA not suggestive of infection.  Blood cultures collected.  CTA chest showing left lower lobe PE, moderate left-sided pleural effusion, bibasilar consolidation and volume loss.  CT abdomen pelvis showing cholelithiasis and no acute finding.  Right upper quadrant ultrasound showing cholelithiasis without complicating factors and changes suspicious for early cirrhosis.  Patient had pain and swelling of her right hand/forearm.  X-ray of right wrist  showing mild degenerative changes without an acute osseous abnormality.  Patient was started on IV heparin and was given Dilaudid, Zofran, oral potassium 40 mEq, 1 L LR bolus, vancomycin, and Zosyn.  Patient reports ongoing epigastric abdominal pain since March 15 which is worse when taking deep breaths.  She is also endorsing bilateral lower extremity edema which she believes has been present since March 15.  Also endorsing right hand/distal forearm swelling and pain which per patient has also been present for the same amount of time.  Reports shortness of breath.  Denies chest pain, fevers, or cough.  Denies nausea, vomiting, diarrhea, or constipation.  Patient was very upset that all of her problems are due to her recent hospitalization.  Not able to get additional history from her at this time.  Review of Systems:  Review of Systems  All other systems reviewed and are negative.   Past Medical History:  Diagnosis Date   Alcohol abuse    Anemia    Aortic atherosclerosis (HCC)    Cannabis abuse    Centrilobular emphysema (HCC)    Cervical myelopathy (HCC)    with BUE paresthesias   Hepatitis-C    HLD (hyperlipidemia)    HTN (hypertension)    Nicotine addiction    Tobacco abuse     Past Surgical History:  Procedure Laterality Date   ABDOMINAL HERNIA REPAIR     POSTERIOR CERVICAL FUSION/FORAMINOTOMY N/A 07/28/2022   Procedure: POSTERIOR CERVICAL DECOMPRESSION, INSTRUMENTATION AND FUSION CERVICAL THREE-CERVICAL SIX;  Surgeon: Dawley, Alan Mulder, DO;  Location: [facility] OR;  Service: Neurosurgery;  Laterality: N/A;     reports that she has been smoking cigarettes. She has a 8.00 pack-year smoking history. She has been exposed to tobacco smoke. She  has never used smokeless tobacco. She reports current alcohol use of about 42.0 standard drinks of alcohol per week. She reports current drug use. Frequency: 2.00 times per week. Drug: Marijuana.  No Known Allergies  Family History  Problem Relation  Age of Onset   High blood pressure Mother     Prior to Admission medications   Medication Sig Start Date End Date Taking? Authorizing Provider  amLODipine (NORVASC) 5 MG tablet Take 1 tablet (5 mg total) by mouth daily. 08/22/22  Yes Angiulli, Mcarthur Rossetti, PA-C  aspirin EC 81 MG tablet Take 1 tablet (81 mg total) by mouth daily. Swallow whole. 10/03/22  Yes Orbie Pyo, MD  atorvastatin (LIPITOR) 40 MG tablet Take 1 tablet (40 mg total) by mouth daily. 10/03/22  Yes Orbie Pyo, MD  Baclofen 5 MG TABS Take 1 tablet (5 mg total) by mouth 3 (three) times daily. 08/22/22  Yes Angiulli, Mcarthur Rossetti, PA-C  gabapentin (NEURONTIN) 300 MG capsule Take 1 capsule (300 mg total) by mouth 2 (two) times daily. 08/22/22  Yes Angiulli, Mcarthur Rossetti, PA-C  methocarbamol (ROBAXIN) 500 MG tablet Take 1 tablet (500 mg total) by mouth every 6 (six) hours as needed for muscle spasms. 08/22/22  Yes Angiulli, Mcarthur Rossetti, PA-C  Oxycodone HCl 10 MG TABS Take 1 tablet (10 mg total) by mouth every 3 (three) hours as needed for severe pain ((score 7 to 10)). 08/22/22  Yes Angiulli, Mcarthur Rossetti, PA-C  acetaminophen (TYLENOL) 325 MG tablet Take 2 tablets (650 mg total) by mouth every 6 (six) hours as needed for mild pain (or Fever >/= 101). Patient not taking: Reported on 10/10/2022 08/01/22   Joseph Art, DO    Physical Exam: Vitals:   10/09/22 2204 10/09/22 2215 10/09/22 2300 10/09/22 2322  BP: (!) 153/76  (!) 141/75   Pulse: (!) 122 (!) 106 99   Resp: (!) 31 (!) 26 17   Temp:    97.6 F (36.4 C)  TempSrc:    Oral  SpO2: 92% 100% 100%   Weight:      Height:        Physical Exam Vitals reviewed.  Constitutional:      General: She is not in acute distress. HENT:     Head: Normocephalic and atraumatic.  Eyes:     Extraocular Movements: Extraocular movements intact.  Cardiovascular:     Rate and Rhythm: Normal rate and regular rhythm.     Pulses: Normal pulses.  Pulmonary:     Effort: Pulmonary effort is normal. No  respiratory distress.     Breath sounds: Normal breath sounds. No wheezing or rales.  Abdominal:     General: Bowel sounds are normal. There is no distension.     Palpations: Abdomen is soft.     Tenderness: There is no abdominal tenderness.  Musculoskeletal:     Cervical back: Normal range of motion.     Right lower leg: Edema present.     Left lower leg: Edema present.     Comments: Swelling of right hand/forearm.  Radial pulse intact.  Skin:    General: Skin is warm and dry.  Neurological:     General: No focal deficit present.     Mental Status: She is alert and oriented to person, place, and time.     Labs on Admission: I have personally reviewed following labs and imaging studies  CBC: Recent Labs  Lab 10/09/22 1708  WBC 17.0*  HGB 9.1*  HCT  27.2*  MCV 83.4  PLT 409*   Basic Metabolic Panel: Recent Labs  Lab 10/09/22 1708  NA 132*  K 3.3*  CL 98  CO2 21*  GLUCOSE 107*  BUN 15  CREATININE 0.82  CALCIUM 9.1   GFR: Estimated Creatinine Clearance: 62.9 mL/min (by C-G formula based on SCr of 0.82 mg/dL). Liver Function Tests: Recent Labs  Lab 10/09/22 1708  AST 30  ALT 9  ALKPHOS 67  BILITOT 0.7  PROT 7.7  ALBUMIN 2.7*   Recent Labs  Lab 10/09/22 1708  LIPASE 49   No results for input(s): "AMMONIA" in the last 168 hours. Coagulation Profile: No results for input(s): "INR", "PROTIME" in the last 168 hours. Cardiac Enzymes: No results for input(s): "CKTOTAL", "CKMB", "CKMBINDEX", "TROPONINI" in the last 168 hours. BNP (last 3 results) No results for input(s): "PROBNP" in the last 8760 hours. HbA1C: No results for input(s): "HGBA1C" in the last 72 hours. CBG: No results for input(s): "GLUCAP" in the last 168 hours. Lipid Profile: No results for input(s): "CHOL", "HDL", "LDLCALC", "TRIG", "CHOLHDL", "LDLDIRECT" in the last 72 hours. Thyroid Function Tests: No results for input(s): "TSH", "T4TOTAL", "FREET4", "T3FREE", "THYROIDAB" in the last 72  hours. Anemia Panel: No results for input(s): "VITAMINB12", "FOLATE", "FERRITIN", "TIBC", "IRON", "RETICCTPCT" in the last 72 hours. Urine analysis:    Component Value Date/Time   COLORURINE YELLOW 10/09/2022 1828   APPEARANCEUR CLEAR 10/09/2022 1828   LABSPEC 1.009 10/09/2022 1828   PHURINE 5.0 10/09/2022 1828   GLUCOSEU NEGATIVE 10/09/2022 1828   HGBUR SMALL (A) 10/09/2022 1828   BILIRUBINUR NEGATIVE 10/09/2022 1828   KETONESUR NEGATIVE 10/09/2022 1828   PROTEINUR 30 (A) 10/09/2022 1828   UROBILINOGEN 1.0 08/31/2013 1415   NITRITE NEGATIVE 10/09/2022 1828   LEUKOCYTESUR NEGATIVE 10/09/2022 1828    Radiological Exams on Admission: US Abdomen Limited RUQ (LIVER/GB)  Result Date: 10/09/2022 CLINICAL DATA:  Right upper quadrant pain EXAM: ULTRASOUND ABDOMEN LIMITED RIGHT UPPER QUADRANT COMPARISON:  CT from earlier in the same day. FINDINGS: Gallbladder: Gallbladder is well distended. Multiple gallstones are noted within. No wall thickening or pericholecystic fluid is noted. Negative sonographic Murphy's sign is elicited. Common bile duct: Diameter: 3.3 mm. Liver: Mild nodularity is noted similar to that seen on the prior CT examination. No focal mass is noted. Portal vein is patent on color Doppler imaging with normal direction of blood flow towards the liver. Other: None. IMPRESSION: Cholelithiasis without complicating factors. Changes suspicious for early cirrhosis. Electronically Signed   By: Alcide Clever M.D.   On: 10/09/2022 21:50   CT Angio Chest PE W and/or Wo Contrast  Result Date: 10/09/2022 CLINICAL DATA:  Pleurisy, abdominal pain, vomiting. EXAM: CT ANGIOGRAPHY CHEST CT ABDOMEN AND PELVIS WITH CONTRAST TECHNIQUE: Multidetector CT imaging of the chest was performed using the standard protocol during bolus administration of intravenous contrast. Multiplanar CT image reconstructions and MIPs were obtained to evaluate the vascular anatomy. Multidetector CT imaging of the abdomen and  pelvis was performed using the standard protocol during bolus administration of intravenous contrast. RADIATION DOSE REDUCTION: This exam was performed according to the departmental dose-optimization program which includes automated exposure control, adjustment of the mA and/or kV according to patient size and/or use of iterative reconstruction technique. CONTRAST:  75mL OMNIPAQUE IOHEXOL 350 MG/ML SOLN COMPARISON:  None Available. FINDINGS: CTA CHEST FINDINGS Cardiovascular: Mild cardiomegaly. No pericardial effusion. No aortic aneurysm or dissection. Atheromatous calcifications of the aorta and coronary arteries. No RV enlargement or reflux of contrast to  the IVC to suggest insufficiency of the tricuspid or right heart strain. Filling defects identified in left lower lobe branch pulmonary arteries consistent with PE. Mediastinum/Nodes: No enlarged mediastinal, hilar, or axillary lymph nodes. Thyroid gland, trachea, and esophagus demonstrate no significant findings. Lungs/Pleura: Moderate left-sided pleural effusion. Bibasilar subsegmental atelectasis. No pneumothorax. Consolidation bilateral lower lobes. Musculoskeletal: No chest wall abnormality. No acute or significant osseous findings. Review of the MIP images confirms the above findings. CT ABDOMEN and PELVIS FINDINGS Hepatobiliary: Calcified gallstone. No hepatic parenchymal lesions or biliary ductal dilatation. Gallbladder demonstrates a somewhat nodular configuration suggestive of cirrhosis, and clinical correlation recommended. Pancreas: Unremarkable. No pancreatic ductal dilatation or surrounding inflammatory changes. Spleen: Normal in size without focal abnormality. Adrenals/Urinary Tract: Adrenal glands are unremarkable. Kidneys are normal, without renal calculi, focal lesion, or hydronephrosis. There are bilateral extrarenal pelves. Bladder is unremarkable. Stomach/Bowel: Stomach is within normal limits. Appendix appears normal. No evidence of bowel  wall thickening, distention, or inflammatory changes. Vascular/Lymphatic: Aortic atherosclerosis. No enlarged abdominal or pelvic lymph nodes. Reproductive: Uterus and bilateral adnexa are unremarkable. Other: No abdominal wall hernia or abnormality. No abdominopelvic ascites. Musculoskeletal: No acute or significant osseous findings. L4-5 degenerative disc disease with vacuum disc changes. Review of the MIP images confirms the above findings. IMPRESSION: 1. Left lower lobe pulmonary emboli. 2. Left-sided pleural effusion. 3. Bibasilar consolidation and volume loss. 4. Cholelithiasis. Findings were discussed with and acknowledged by Dr. Elpidio Anis at 8:50 p.m. 10/09/2022. Electronically Signed   By: Layla Maw M.D.   On: 10/09/2022 20:55   CT ABDOMEN PELVIS W CONTRAST  Result Date: 10/09/2022 CLINICAL DATA:  Pleurisy, abdominal pain, vomiting. EXAM: CT ANGIOGRAPHY CHEST CT ABDOMEN AND PELVIS WITH CONTRAST TECHNIQUE: Multidetector CT imaging of the chest was performed using the standard protocol during bolus administration of intravenous contrast. Multiplanar CT image reconstructions and MIPs were obtained to evaluate the vascular anatomy. Multidetector CT imaging of the abdomen and pelvis was performed using the standard protocol during bolus administration of intravenous contrast. RADIATION DOSE REDUCTION: This exam was performed according to the departmental dose-optimization program which includes automated exposure control, adjustment of the mA and/or kV according to patient size and/or use of iterative reconstruction technique. CONTRAST:  75mL OMNIPAQUE IOHEXOL 350 MG/ML SOLN COMPARISON:  None Available. FINDINGS: CTA CHEST FINDINGS Cardiovascular: Mild cardiomegaly. No pericardial effusion. No aortic aneurysm or dissection. Atheromatous calcifications of the aorta and coronary arteries. No RV enlargement or reflux of contrast to the IVC to suggest insufficiency of the tricuspid or right heart strain.  Filling defects identified in left lower lobe branch pulmonary arteries consistent with PE. Mediastinum/Nodes: No enlarged mediastinal, hilar, or axillary lymph nodes. Thyroid gland, trachea, and esophagus demonstrate no significant findings. Lungs/Pleura: Moderate left-sided pleural effusion. Bibasilar subsegmental atelectasis. No pneumothorax. Consolidation bilateral lower lobes. Musculoskeletal: No chest wall abnormality. No acute or significant osseous findings. Review of the MIP images confirms the above findings. CT ABDOMEN and PELVIS FINDINGS Hepatobiliary: Calcified gallstone. No hepatic parenchymal lesions or biliary ductal dilatation. Gallbladder demonstrates a somewhat nodular configuration suggestive of cirrhosis, and clinical correlation recommended. Pancreas: Unremarkable. No pancreatic ductal dilatation or surrounding inflammatory changes. Spleen: Normal in size without focal abnormality. Adrenals/Urinary Tract: Adrenal glands are unremarkable. Kidneys are normal, without renal calculi, focal lesion, or hydronephrosis. There are bilateral extrarenal pelves. Bladder is unremarkable. Stomach/Bowel: Stomach is within normal limits. Appendix appears normal. No evidence of bowel wall thickening, distention, or inflammatory changes. Vascular/Lymphatic: Aortic atherosclerosis. No enlarged abdominal or pelvic lymph nodes. Reproductive: Uterus  and bilateral adnexa are unremarkable. Other: No abdominal wall hernia or abnormality. No abdominopelvic ascites. Musculoskeletal: No acute or significant osseous findings. L4-5 degenerative disc disease with vacuum disc changes. Review of the MIP images confirms the above findings. IMPRESSION: 1. Left lower lobe pulmonary emboli. 2. Left-sided pleural effusion. 3. Bibasilar consolidation and volume loss. 4. Cholelithiasis. Findings were discussed with and acknowledged by Dr. Elpidio Anis at 8:50 p.m. 10/09/2022. Electronically Signed   By: Layla Maw M.D.   On:  10/09/2022 20:55   DG Wrist Complete Right  Result Date: 10/09/2022 CLINICAL DATA:  Right wrist pain and swelling. EXAM: RIGHT WRIST - COMPLETE 3+ VIEW COMPARISON:  March 18, 2021 FINDINGS: There is no evidence of an acute fracture or dislocation. Mild degenerative changes are seen involving the distal aspect of the right wrist. Mild, stable degenerative changes are also seen along the ulnocarpal joint. Mild dorsal soft tissue swelling is noted. IMPRESSION: Mild degenerative changes without an acute osseous abnormality. Electronically Signed   By: Aram Candela M.D.   On: 10/09/2022 18:44    EKG: Independently reviewed.  Sinus tachycardia.  Assessment and Plan  Acute left lower lobe PE In the setting of recent hospitalization/surgery.  No evidence of right heart strain on CT.  Troponin and BNP normal.  Oxygen saturation was in the low 90s in the ED, currently stable on 2 L [place].  Continue IV heparin and supplemental oxygen.  Bilateral lower extremity Dopplers ordered.  Also she has swelling of her right hand/forearm and Doppler ultrasound ordered to rule out DVT.  Possible healthcare associated pneumonia SIRS CT showing bibasilar consolidation.  She does have leukocytosis on labs.  Tachycardia has improved and no longer tachypneic. No fever or other signs of sepsis at this time.  Lactic acid is normal.  Continue antibiotic coverage with vancomycin and cefepime.  Blood cultures pending.  Monitor WBC count and check procalcitonin level.  MRSA PCR screen.  Strep pneumo/Legionella urinary antigens.  SARS-CoV-2 PCR.  Moderate left-sided pleural effusion Possibly parapneumonic.  BNP is normal.  May need thoracentesis.  Hypertension Currently normotensive.  Continue amlodipine.  Hyperlipidemia Continue Lipitor.  History of alcohol abuse CIWA protocol; Ativan as needed.  Thiamine, folate, and multivitamin.  Chronic anemia Hemoglobin stable, continue to monitor.  Mild hyponatremia Patient  received IV fluids.  Repeat labs to check sodium level.  Mild hypokalemia Continue to monitor potassium level and replace if low.  Check magnesium level.  Code Status: DNR/DNI (discussed with the patient) Family Communication: No family available at this time. Level of care: Progressive Care Unit Admission status: It is my clinical opinion that admission to INPATIENT is reasonable and necessary because of the expectation that this patient will require hospital care that crosses at least 2 midnights to treat this condition based on the medical complexity of the problems presented.  Given the aforementioned information, the predictability of an adverse outcome is felt to be significant.   John Giovanni MD [facility]  If 7PM-7AM, please contact night-coverage [email or web address]  10/10/2022, 12:59 AM

## 2022-10-10 NOTE — Progress Notes (Signed)
ANTICOAGULATION CONSULT NOTE Pharmacy Consult for Heparin Indication: pulmonary embolus Brief A/P: Heparin level subtherapeutic Increase Heparin rate  No Known Allergies  Patient Measurements: Height: 5\' 7"  (170.2 cm) Weight: 59.2 kg (130 lb 9.6 oz) IBW/kg (Calculated) : 61.6 Heparin Dosing Weight: 59 kg  Vital Signs: Temp: 98.5 F (36.9 C) (05/01 0252) Temp Source: Oral (05/01 0252) BP: 131/74 (05/01 0030) Pulse Rate: 98 (05/01 0030)  Labs: Recent Labs    10/09/22 1708 10/10/22 0410  HGB 9.1* 8.2*  HCT 27.2* 23.9*  PLT 409* 384  HEPARINUNFRC  --  0.12*  CREATININE 0.82  --   TROPONINIHS 8  --      Estimated Creatinine Clearance: 63.1 mL/min (by C-G formula based on SCr of 0.82 mg/dL).   Assessment: 67 y.o. female with PE for heparin  Goal of Therapy:  Heparin level 0.3-0.7 units/ml Monitor platelets by anticoagulation protocol: Yes   Plan:  Heparin 2000 units IV bolus, then increase heparin  1250 units/hr Check heparin level in 6 hours.   Geannie Risen, PharmD, BCPS

## 2022-10-10 NOTE — ED Notes (Signed)
ED TO INPATIENT HANDOFF REPORT  ED Nurse Name and Phone #: Grant Fontana PM [phone]  S Name/Age/Gender Kristen Bautista 67 y.o. female Room/Bed: 007C/007C  Code Status   Code Status: Prior  Home/SNF/Other Home Patient oriented to: self, place, time, and situation Is this baseline? Yes   Triage Complete: Triage complete  Chief Complaint Acute pulmonary embolism (HCC) [I26.99]  Triage Note Pt c/o epigastric/abdominal pain anytime she takes a deep breath. Pt states it has been going on since March 14th. States she is tired of the pain. Denies any n/v.   Allergies No Known Allergies  Level of Care/Admitting Diagnosis ED Disposition     ED Disposition  Admit   Condition  --   Comment  Hospital Area: [facility] [[id number]]  Level of Care: Progressive [102]  Admit to Progressive based on following criteria: RESPIRATORY PROBLEMS hypoxemic/hypercapnic respiratory failure that is responsive to NIPPV (BiPAP) or High Flow Nasal Cannula (6-80 lpm). Frequent assessment/intervention, no > Q2 hrs < Q4 hrs, to maintain oxygenation and pulmonary hygiene.  May admit patient to [facility] or [facility] if equivalent level of care is available:: Yes  Covid Evaluation: Asymptomatic - no recent exposure (last 10 days) testing not required  Diagnosis: Acute pulmonary embolism [facility]) [[id number]]  Admitting Physician: John Giovanni [[id number]]  Attending Physician: John Giovanni [[id number]]  Certification:: I certify this patient will need inpatient services for at least 2 midnights  Estimated Length of Stay: 2          B Medical/Surgery History Past Medical History:  Diagnosis Date   Alcohol abuse    Anemia    Aortic atherosclerosis (HCC)    Cannabis abuse    Centrilobular emphysema (HCC)    Cervical myelopathy (HCC)    with BUE paresthesias   Hepatitis-C    HLD (hyperlipidemia)    HTN (hypertension)    Nicotine addiction    Tobacco abuse    Past Surgical  History:  Procedure Laterality Date   ABDOMINAL HERNIA REPAIR     POSTERIOR CERVICAL FUSION/FORAMINOTOMY N/A 07/28/2022   Procedure: POSTERIOR CERVICAL DECOMPRESSION, INSTRUMENTATION AND FUSION CERVICAL THREE-CERVICAL SIX;  Surgeon: Bethann Goo, DO;  Location: [facility] OR;  Service: Neurosurgery;  Laterality: N/A;     A IV Location/Drains/Wounds Patient Lines/Drains/Airways Status     Active Line/Drains/Airways     Name Placement date Placement time Site Days   Peripheral IV 10/09/22 20 G Right Antecubital 10/09/22  1712  Antecubital  1   Peripheral IV 10/09/22 22 G Anterior;Right Forearm 10/09/22  2210  Forearm  1            Intake/Output Last 24 hours No intake or output data in the 24 hours ending 10/10/22 0139  Labs/Imaging Results for orders placed or performed during the hospital encounter of 10/09/22 (from the past 48 hour(s))  Lipase, blood     Status: None   Collection Time: 10/09/22  5:08 PM  Result Value Ref Range   Lipase 49 11 - 51 U/L    Comment: Performed at [facility], [address] [place], [place] [address]  Comprehensive metabolic panel     Status: Abnormal   Collection Time: 10/09/22  5:08 PM  Result Value Ref Range   Sodium 132 (L) 135 - 145 mmol/L   Potassium 3.3 (L) 3.5 - 5.1 mmol/L   Chloride 98 98 - 111 mmol/L   CO2 21 (L) 22 - 32 mmol/L   Glucose, Bld 107 (H)  70 - 99 mg/dL    Comment: Glucose reference range applies only to samples taken after fasting for at least 8 hours.   BUN 15 8 - 23 mg/dL   Creatinine, Ser 1.61 0.44 - 1.00 mg/dL   Calcium 9.1 8.9 - 09.6 mg/dL   Total Protein 7.7 6.5 - 8.1 g/dL   Albumin 2.7 (L) 3.5 - 5.0 g/dL   AST 30 15 - 41 U/L   ALT 9 0 - 44 U/L   Alkaline Phosphatase 67 38 - 126 U/L   Total Bilirubin 0.7 0.3 - 1.2 mg/dL   GFR, Estimated >04 >54 mL/min    Comment: (NOTE) Calculated using the CKD-EPI Creatinine Equation (2021)    Anion gap 13 5 - 15    Comment: Performed at [facility], [address] [place], [place] [address]  CBC     Status: Abnormal   Collection Time: 10/09/22  5:08 PM  Result Value Ref Range   WBC 17.0 (H) 4.0 - 10.5 K/uL   RBC 3.26 (L) 3.87 - 5.11 MIL/uL   Hemoglobin 9.1 (L) 12.0 - 15.0 g/dL   HCT 91.4 (L) 78.2 - 95.6 %   MCV 83.4 80.0 - 100.0 fL   MCH 27.9 26.0 - 34.0 pg   MCHC 33.5 30.0 - 36.0 g/dL   RDW 21.3 08.6 - 57.8 %   Platelets 409 (H) 150 - 400 K/uL   nRBC 0.0 0.0 - 0.2 %    Comment: Performed at [facility], [address], [place], [place] [address]  Troponin I (High Sensitivity)     Status: None   Collection Time: 10/09/22  5:08 PM  Result Value Ref Range   Troponin I (High Sensitivity) 8 <18 ng/L    Comment: (NOTE) Elevated high sensitivity troponin I (hsTnI) values and significant  changes across serial measurements may suggest ACS but many other  chronic and acute conditions are known to elevate hsTnI results.  Refer to the "Links" section for chest pain algorithms and additional  guidance. Performed at [facility], [address], [place], [place] [address]   Lactic acid, plasma     Status: None   Collection Time: 10/09/22  5:08 PM  Result Value Ref Range   Lactic Acid, Venous 0.9 0.5 - 1.9 mmol/L    Comment: Performed at [facility], [address], [place], [place] [address]  Brain natriuretic peptide     Status: None   Collection Time: 10/09/22  5:09 PM  Result Value Ref Range   B Natriuretic Peptide 46.7 0.0 - 100.0 pg/mL    Comment: Performed at [facility], [address] [place], [place] [address]  Urinalysis, Routine w reflex microscopic -Urine, Clean Catch     Status: Abnormal   Collection Time: 10/09/22  6:28 PM  Result Value Ref Range   Color, Urine YELLOW YELLOW   APPearance CLEAR CLEAR   Specific Gravity, Urine 1.009 1.005 - 1.030   pH 5.0 5.0 - 8.0   Glucose, UA NEGATIVE NEGATIVE mg/dL   Hgb urine dipstick SMALL (A) NEGATIVE   Bilirubin Urine NEGATIVE NEGATIVE   Ketones, ur  NEGATIVE NEGATIVE mg/dL   Protein, ur 30 (A) NEGATIVE mg/dL   Nitrite NEGATIVE NEGATIVE   Leukocytes,Ua NEGATIVE NEGATIVE   RBC / HPF 0-5 0 - 5 RBC/hpf   WBC, UA 0-5 0 - 5 WBC/hpf   Bacteria, UA RARE (A) NONE SEEN   Squamous Epithelial /  HPF 0-5 0 - 5 /HPF   Mucus PRESENT     Comment: Performed at [facility], [address], [place], [place] [address]  Lactic acid, plasma     Status: None   Collection Time: 10/09/22  7:14 PM  Result Value Ref Range   Lactic Acid, Venous 1.2 0.5 - 1.9 mmol/L    Comment: Performed at [facility], [address] [place], [place] [address]   US Abdomen Limited RUQ (LIVER/GB)  Result Date: 10/09/2022 CLINICAL DATA:  Right upper quadrant pain EXAM: ULTRASOUND ABDOMEN LIMITED RIGHT UPPER QUADRANT COMPARISON:  CT from earlier in the same day. FINDINGS: Gallbladder: Gallbladder is well distended. Multiple gallstones are noted within. No wall thickening or pericholecystic fluid is noted. Negative sonographic Murphy's sign is elicited. Common bile duct: Diameter: 3.3 mm. Liver: Mild nodularity is noted similar to that seen on the prior CT examination. No focal mass is noted. Portal vein is patent on color Doppler imaging with normal direction of blood flow towards the liver. Other: None. IMPRESSION: Cholelithiasis without complicating factors. Changes suspicious for early cirrhosis. Electronically Signed   By: Alcide Clever M.D.   On: 10/09/2022 21:50   CT Angio Chest PE W and/or Wo Contrast  Result Date: 10/09/2022 CLINICAL DATA:  Pleurisy, abdominal pain, vomiting. EXAM: CT ANGIOGRAPHY CHEST CT ABDOMEN AND PELVIS WITH CONTRAST TECHNIQUE: Multidetector CT imaging of the chest was performed using the standard protocol during bolus administration of intravenous contrast. Multiplanar CT image reconstructions and MIPs were obtained to evaluate the vascular anatomy. Multidetector CT imaging of the abdomen and pelvis was performed using the standard protocol  during bolus administration of intravenous contrast. RADIATION DOSE REDUCTION: This exam was performed according to the departmental dose-optimization program which includes automated exposure control, adjustment of the mA and/or kV according to patient size and/or use of iterative reconstruction technique. CONTRAST:  75mL OMNIPAQUE IOHEXOL 350 MG/ML SOLN COMPARISON:  None Available. FINDINGS: CTA CHEST FINDINGS Cardiovascular: Mild cardiomegaly. No pericardial effusion. No aortic aneurysm or dissection. Atheromatous calcifications of the aorta and coronary arteries. No RV enlargement or reflux of contrast to the IVC to suggest insufficiency of the tricuspid or right heart strain. Filling defects identified in left lower lobe Lavergne Hiltunen pulmonary arteries consistent with PE. Mediastinum/Nodes: No enlarged mediastinal, hilar, or axillary lymph nodes. Thyroid gland, trachea, and esophagus demonstrate no significant findings. Lungs/Pleura: Moderate left-sided pleural effusion. Bibasilar subsegmental atelectasis. No pneumothorax. Consolidation bilateral lower lobes. Musculoskeletal: No chest wall abnormality. No acute or significant osseous findings. Review of the MIP images confirms the above findings. CT ABDOMEN and PELVIS FINDINGS Hepatobiliary: Calcified gallstone. No hepatic parenchymal lesions or biliary ductal dilatation. Gallbladder demonstrates a somewhat nodular configuration suggestive of cirrhosis, and clinical correlation recommended. Pancreas: Unremarkable. No pancreatic ductal dilatation or surrounding inflammatory changes. Spleen: Normal in size without focal abnormality. Adrenals/Urinary Tract: Adrenal glands are unremarkable. Kidneys are normal, without renal calculi, focal lesion, or hydronephrosis. There are bilateral extrarenal pelves. Bladder is unremarkable. Stomach/Bowel: Stomach is within normal limits. Appendix appears normal. No evidence of bowel wall thickening, distention, or inflammatory  changes. Vascular/Lymphatic: Aortic atherosclerosis. No enlarged abdominal or pelvic lymph nodes. Reproductive: Uterus and bilateral adnexa are unremarkable. Other: No abdominal wall hernia or abnormality. No abdominopelvic ascites. Musculoskeletal: No acute or significant osseous findings. L4-5 degenerative disc disease with vacuum disc changes. Review of the MIP images confirms the above findings. IMPRESSION: 1. Left lower lobe pulmonary emboli. 2. Left-sided pleural effusion. 3. Bibasilar consolidation and volume  loss. 4. Cholelithiasis. Findings were discussed with and acknowledged by Dr. Elpidio Anis at 8:50 p.m. 10/09/2022. Electronically Signed   By: Layla Maw M.D.   On: 10/09/2022 20:55   CT ABDOMEN PELVIS W CONTRAST  Result Date: 10/09/2022 CLINICAL DATA:  Pleurisy, abdominal pain, vomiting. EXAM: CT ANGIOGRAPHY CHEST CT ABDOMEN AND PELVIS WITH CONTRAST TECHNIQUE: Multidetector CT imaging of the chest was performed using the standard protocol during bolus administration of intravenous contrast. Multiplanar CT image reconstructions and MIPs were obtained to evaluate the vascular anatomy. Multidetector CT imaging of the abdomen and pelvis was performed using the standard protocol during bolus administration of intravenous contrast. RADIATION DOSE REDUCTION: This exam was performed according to the departmental dose-optimization program which includes automated exposure control, adjustment of the mA and/or kV according to patient size and/or use of iterative reconstruction technique. CONTRAST:  75mL OMNIPAQUE IOHEXOL 350 MG/ML SOLN COMPARISON:  None Available. FINDINGS: CTA CHEST FINDINGS Cardiovascular: Mild cardiomegaly. No pericardial effusion. No aortic aneurysm or dissection. Atheromatous calcifications of the aorta and coronary arteries. No RV enlargement or reflux of contrast to the IVC to suggest insufficiency of the tricuspid or right heart strain. Filling defects identified in left lower lobe  Darek Eifler pulmonary arteries consistent with PE. Mediastinum/Nodes: No enlarged mediastinal, hilar, or axillary lymph nodes. Thyroid gland, trachea, and esophagus demonstrate no significant findings. Lungs/Pleura: Moderate left-sided pleural effusion. Bibasilar subsegmental atelectasis. No pneumothorax. Consolidation bilateral lower lobes. Musculoskeletal: No chest wall abnormality. No acute or significant osseous findings. Review of the MIP images confirms the above findings. CT ABDOMEN and PELVIS FINDINGS Hepatobiliary: Calcified gallstone. No hepatic parenchymal lesions or biliary ductal dilatation. Gallbladder demonstrates a somewhat nodular configuration suggestive of cirrhosis, and clinical correlation recommended. Pancreas: Unremarkable. No pancreatic ductal dilatation or surrounding inflammatory changes. Spleen: Normal in size without focal abnormality. Adrenals/Urinary Tract: Adrenal glands are unremarkable. Kidneys are normal, without renal calculi, focal lesion, or hydronephrosis. There are bilateral extrarenal pelves. Bladder is unremarkable. Stomach/Bowel: Stomach is within normal limits. Appendix appears normal. No evidence of bowel wall thickening, distention, or inflammatory changes. Vascular/Lymphatic: Aortic atherosclerosis. No enlarged abdominal or pelvic lymph nodes. Reproductive: Uterus and bilateral adnexa are unremarkable. Other: No abdominal wall hernia or abnormality. No abdominopelvic ascites. Musculoskeletal: No acute or significant osseous findings. L4-5 degenerative disc disease with vacuum disc changes. Review of the MIP images confirms the above findings. IMPRESSION: 1. Left lower lobe pulmonary emboli. 2. Left-sided pleural effusion. 3. Bibasilar consolidation and volume loss. 4. Cholelithiasis. Findings were discussed with and acknowledged by Dr. Elpidio Anis at 8:50 p.m. 10/09/2022. Electronically Signed   By: Layla Maw M.D.   On: 10/09/2022 20:55   DG Wrist Complete  Right  Result Date: 10/09/2022 CLINICAL DATA:  Right wrist pain and swelling. EXAM: RIGHT WRIST - COMPLETE 3+ VIEW COMPARISON:  March 18, 2021 FINDINGS: There is no evidence of an acute fracture or dislocation. Mild degenerative changes are seen involving the distal aspect of the right wrist. Mild, stable degenerative changes are also seen along the ulnocarpal joint. Mild dorsal soft tissue swelling is noted. IMPRESSION: Mild degenerative changes without an acute osseous abnormality. Electronically Signed   By: Aram Candela M.D.   On: 10/09/2022 18:44    Pending Labs Unresulted Labs (From admission, onward)     Start     Ordered   10/11/22 0500  Heparin level (unfractionated)  Daily,   R      10/09/22 2151   10/10/22 0600  Heparin level (unfractionated)  Once-Timed,   URGENT  10/09/22 2151   10/09/22 1709  Blood culture (routine x 2)  BLOOD CULTURE X 2,   R (with STAT occurrences)      10/09/22 1709            Vitals/Pain Today's Vitals   10/09/22 2204 10/09/22 2215 10/09/22 2300 10/09/22 2322  BP: (!) 153/76  (!) 141/75   Pulse: (!) 122 (!) 106 99   Resp: (!) 31 (!) 26 17   Temp:    97.6 F (36.4 C)  TempSrc:    Oral  SpO2: 92% 100% 100%   Weight:      Height:      PainSc:        Isolation Precautions No active isolations  Medications Medications  heparin ADULT infusion 100 units/mL (25000 units/27mL) (1,000 Units/hr Intravenous New Bag/Given 10/09/22 2215)  vancomycin (VANCOREADY) IVPB 1250 mg/250 mL (has no administration in time range)  HYDROmorphone (DILAUDID) injection 1 mg (1 mg Intravenous Given 10/09/22 1712)  ondansetron (ZOFRAN) injection 4 mg (4 mg Intravenous Given 10/09/22 1712)  lactated ringers bolus 1,000 mL (0 mLs Intravenous Stopped 10/09/22 2155)  piperacillin-tazobactam (ZOSYN) IVPB 3.375 g (0 g Intravenous Stopped 10/09/22 1858)  vancomycin (VANCOREADY) IVPB 1250 mg/250 mL (0 mg Intravenous Stopped 10/09/22 2210)  iohexol (OMNIPAQUE) 350  MG/ML injection 75 mL (75 mLs Intravenous Contrast Given 10/09/22 2042)  potassium chloride SA (KLOR-CON M) CR tablet 40 mEq (40 mEq Oral Given 10/09/22 2148)  heparin bolus via infusion 3,500 Units (3,500 Units Intravenous Bolus from Bag 10/09/22 2215)    Mobility walks     Focused Assessments Pulmonary Assessment Handoff:  Lung sounds:   O2 Device: Room Air      R Recommendations: See Admitting Provider Note  Report given to:   Additional Notes: Patient has PE, placed onto 2 lpm as she can dest when getting up to bedside commode, and when sleeping.

## 2022-10-10 NOTE — Hospital Course (Addendum)
67 y.o. f w/ aortic atherosclerosis, coronary artery calcification, hepatitis C, hypertension, hyperlipidemia, tobacco abuse, alcohol abuse, cannabis abuse who was admitted to the hospital on 08/01/2022 for C3-C6 severe cervical stenosis with myelopathy concerning for epidural abscess,was treated with IV antibiotics and underwent open posterior cervical lateral mass instrumentation posterior lateral fusion C3-4 4-5 and C5-6 as well as laminotomies evacuation of epidural abscess 07/28/2022 and was discharged to inpatient rehab and stayed there until 3/14 from where discharged on oral antibiotics (Levaquin and doxycycline) for additional 30 days per ID recommendation. She now presented to the ED 10/11/22 w/ epigastric abdominal pain worse with breathing over the past 2 weeks and bilateral lower extremity edema, was tachycardic and tachypneic on arrival to the ED afebrile, not hypotensive. xygen saturation in the low 90s and placed on 2 L supplemental oxygen. Labs showing WBC 17.0, hemoglobin 9.1 (at baseline), platelet count 409k, sodium 132, potassium 3.3, chloride 98, HCO3-21, BUN 15, creatinine 0.8, CBG-107, albumin 2.7, normal lipase and LFTs,troponin negative, lactic acid normal x 2, BNP normal,UA not suggestive of infection.Blood cultures collected.  CTA chest showing left lower lobe PE, moderate left-sided pleural effusion, bibasilar consolidation and volume loss.  CT abdomen pelvis showing cholelithiasis and no acute finding.  Right upper quadrant ultrasound showing cholelithiasis without complicating factors and changes suspicious for early cirrhosis.  Patient had pain and swelling of her right hand/forearm.  X-ray of right wrist showing mild degenerative changes without an acute osseous abnormality.  Patient was started on IV heparin and was given Dilaudid, Zofran, oral potassium 40 mEq, 1 L LR bolus, vancomycin, and Zosyn. Patient managed with IV heparin drip, repeat chest x-ray much better seen by pulmonary  critical care> fPCCM felt most of LLL opacity infarcted lung with only trace of 1 cm effusion with insufficient pocket for safe thoracentesis, advised transition to DOAC at least 3 months course with reevaluation for duration of clinic and clinic will follow-up as outpatient.  ***Patient has been weaned off to room air at rest. She has had tachycardia and hypertension tp amlodipine changed to metoprolol. Procalcitonin unremarkable initially received IV antibiotics and will be discontinued> switched to oral antibiotics to complete a few days course. Noted to have cholelithiasis without complicating factors in RUQ [place] and  changes suspicious for early cirrhosis noted.  She is to follow-up with her PCP.

## 2022-10-10 NOTE — Progress Notes (Signed)
Patient seen and examined personally, I reviewed the chart, history and physical and admission note, done by admitting physician this morning and agree with the same with following addendum.  Please refer to the morning admission note for more detailed plan of care.  Briefly,  67 y.o. f w/ aortic atherosclerosis, coronary artery calcification, hepatitis C, hypertension, hyperlipidemia, tobacco abuse, alcohol abuse, cannabis abuse who was admitted to the hospital on 08/01/2022 for C3-C6 severe cervical stenosis with myelopathy concerning for epidural abscess,was treated with IV antibiotics and underwent open posterior cervical lateral mass instrumentation posterior lateral fusion C3-4 4-5 and C5-6 as well as laminotomies evacuation of epidural abscess 07/28/2022 and was discharged to inpatient rehab and stayed there until 3/14 from where discharged on oral antibiotics (Levaquin and doxycycline) for additional 30 days per ID recommendation. She now presented to the ED 10/11/22 w/ epigastric abdominal pain worse with breathing over the past 2 weeks and bilateral lower extremity edema, was tachycardic and tachypneic on arrival to the ED afebrile, not hypotensive. xygen saturation in the low 90s and placed on 2 L supplemental oxygen. Labs showing WBC 17.0, hemoglobin 9.1 (at baseline), platelet count 409k, sodium 132, potassium 3.3, chloride 98, HCO3-21, BUN 15, creatinine 0.8, CBG-107, albumin 2.7, normal lipase and LFTs,troponin negative, lactic acid normal x 2, BNP normal,UA not suggestive of infection.Blood cultures collected.  CTA chest showing left lower lobe PE, moderate left-sided pleural effusion, bibasilar consolidation and volume loss.  CT abdomen pelvis showing cholelithiasis and no acute finding.  Right upper quadrant ultrasound showing cholelithiasis without complicating factors and changes suspicious for early cirrhosis.  Patient had pain and swelling of her right hand/forearm.  X-ray of right wrist  showing mild degenerative changes without an acute osseous abnormality.  Patient was started on IV heparin and was given Dilaudid, Zofran, oral potassium 40 mEq, 1 L LR bolus, vancomycin, and Zosyn. She also complains of epigastric abdominal pain since March worse with taking deep breath and also endorses bilateral lower extremity edema has been present since March Her cousin lives with her    Seen this am Co pleuritic chest pain On [place] at 0.5-1l Still smokes cigarete Patient otherwise denies any nausea, vomiting, fever, chills, headache, focal weakness, numbness tingling, speech difficulties  Has LE swelling  ISSUES: Acute left lower lobe PE: Managing on heparin drip, follow-up echocardiogram and duplex.  Vitals stable troponin unremarkable.  Echo with preserved EF Moderate left-sided pleural effusion: Likely in the setting of 1 vs parapneumonic effusion repeat chest x-ray in the morning-if persistent may need thoracentesis Possible pneumonia/SIRS: Continue broad-spectrum antibiotics.  COVID-19 negative.  Leukocytosis downtrending.  Others as per HPI Hypertension: BP stable on room air. Hyperlipidemia: Continue Lipitor Alcohol abuse history-watch for withdrawls. Chronic anemia-hemoglobin is stable, maintaining Mild hyponatremia-monitor Mild hypokalemia/dizziness-replaced Hypoalbuminemia: RD consulted

## 2022-10-11 ENCOUNTER — Inpatient Hospital Stay (HOSPITAL_COMMUNITY): Payer: No Typology Code available for payment source

## 2022-10-11 ENCOUNTER — Telehealth: Payer: Self-pay | Admitting: Student

## 2022-10-11 DIAGNOSIS — I2699 Other pulmonary embolism without acute cor pulmonale: Secondary | ICD-10-CM | POA: Diagnosis not present

## 2022-10-11 DIAGNOSIS — J90 Pleural effusion, not elsewhere classified: Secondary | ICD-10-CM

## 2022-10-11 LAB — MAGNESIUM: Magnesium: 1.6 mg/dL — ABNORMAL LOW (ref 1.7–2.4)

## 2022-10-11 LAB — CULTURE, BLOOD (ROUTINE X 2): Culture: NO GROWTH

## 2022-10-11 LAB — CBC
HCT: 23 % — ABNORMAL LOW (ref 36.0–46.0)
Hemoglobin: 8.3 g/dL — ABNORMAL LOW (ref 12.0–15.0)
MCH: 28 pg (ref 26.0–34.0)
MCHC: 36.1 g/dL — ABNORMAL HIGH (ref 30.0–36.0)
MCV: 77.7 fL — ABNORMAL LOW (ref 80.0–100.0)
Platelets: 420 10*3/uL — ABNORMAL HIGH (ref 150–400)
RBC: 2.96 MIL/uL — ABNORMAL LOW (ref 3.87–5.11)
RDW: 14 % (ref 11.5–15.5)
WBC: 12 10*3/uL — ABNORMAL HIGH (ref 4.0–10.5)
nRBC: 0 % (ref 0.0–0.2)

## 2022-10-11 LAB — BASIC METABOLIC PANEL
Anion gap: 9 (ref 5–15)
BUN: 9 mg/dL (ref 8–23)
CO2: 21 mmol/L — ABNORMAL LOW (ref 22–32)
Calcium: 8.3 mg/dL — ABNORMAL LOW (ref 8.9–10.3)
Chloride: 104 mmol/L (ref 98–111)
Creatinine, Ser: 0.75 mg/dL (ref 0.44–1.00)
GFR, Estimated: >60 mL/min (ref >60)
Glucose, Bld: 96 mg/dL (ref 70–99)
Potassium: 3 mmol/L — ABNORMAL LOW (ref 3.5–5.1)
Sodium: 134 mmol/L — ABNORMAL LOW (ref 135–145)

## 2022-10-11 LAB — STREP PNEUMONIAE URINARY ANTIGEN: Strep Pneumo Urinary Antigen: NEGATIVE

## 2022-10-11 LAB — MRSA NEXT GEN BY PCR, NASAL: MRSA by PCR Next Gen: NOT DETECTED

## 2022-10-11 LAB — HEPARIN LEVEL (UNFRACTIONATED): Heparin Unfractionated: 0.46 IU/mL (ref 0.30–0.70)

## 2022-10-11 MED ORDER — POTASSIUM CHLORIDE 20 MEQ PO PACK
40.0000 meq | PACK | ORAL | Status: AC
  Administered 2022-10-11 (×2): 40 meq via ORAL
  Filled 2022-10-11 (×2): qty 2

## 2022-10-11 MED ORDER — MAGNESIUM SULFATE 2 GM/50ML IV SOLN
2.0000 g | Freq: Once | INTRAVENOUS | Status: AC
  Administered 2022-10-11: 2 g via INTRAVENOUS
  Filled 2022-10-11: qty 50

## 2022-10-11 MED ORDER — GUAIFENESIN-DM 100-10 MG/5ML PO SYRP: 10.0000 mL | ORAL_SOLUTION | ORAL | Status: DC | PRN

## 2022-10-11 MED ORDER — GUAIFENESIN ER 600 MG PO TB12
600.0000 mg | ORAL_TABLET | Freq: Two times a day (BID) | ORAL | Status: DC
  Administered 2022-10-11 – 2022-10-13 (×5): 600 mg via ORAL
  Filled 2022-10-11 (×5): qty 1

## 2022-10-11 NOTE — Consult Note (Signed)
NAME:  Kristen Bautista, MRN:  [id number], DOB:  28-Nov-1955, LOS: 1 ADMISSION DATE:  10/09/2022, CONSULTATION DATE:  10/11/2022 REFERRING MD:  Lanae Boast - [facility] , CHIEF COMPLAINT:  Pleural effusion    History of Present Illness:  Kristen Bautista is a 67 y.o. female with a PMH significant for severe cervical stenosis with associated epidural abscess, HTN, HLD, hepatitis C, tobacco use, cannabis use, and anemia who presented to the ED 10/11/22 for epigastric ABD pain, extremity weakness, tachycardia, and tachypnea. CTA chest was obtained and revealed left lower lobe PE with moderate pleural effusion. She was admitted per [facility] for IV heparin drip.   Of note patient was treated at this facility 07/26/22 - 08/01/22 for cervical stenosis with myelopathy and epidural abscess. On 07/28/22 patient was taken for laminectomies and evacuation of C3-C4 epidural abscess. She was treated with prolonged antibiotics per IR recommendation and eventually discharged to inpatient rehab and later sent home 08/23/22.   PCCM consulted mid morning of 5/2 for assistance in management of moderate left pleural effusion. She tells me she feels a little winded when she does light activity but not at rest. Has some pleuritic L CP.  Pertinent  Medical History   Past Medical History:  Diagnosis Date   Alcohol abuse    Anemia    Aortic atherosclerosis (HCC)    Cannabis abuse    Centrilobular emphysema (HCC)    Cervical myelopathy (HCC)    with BUE paresthesias   Hepatitis-C    HLD (hyperlipidemia)    HTN (hypertension)    Nicotine addiction    Tobacco abuse    Significant Hospital Events: Including procedures, antibiotic start and stop dates in addition to other pertinent events   4/30 presented with epigastric ABD pain, extremity weakness, tachycardia, and tachypnea. CTA chest was obtained and revealed left lower lobe PE with moderate pleural effusion.  5/2 PCCM consulted for assistance in management of pleural effusion    Interim History / Subjective:  As above   Objective   Blood pressure (!) 149/77, pulse (!) 101, temperature 98.4 F (36.9 C), temperature source Oral, resp. rate 20, height 5\' 7"  (1.702 m), weight 59.2 kg, SpO2 98 %.        Intake/Output Summary (Last 24 hours) at 10/11/2022 1318 Last data filed at 10/11/2022 1304 Gross per 24 hour  Intake --  Output 2700 ml  Net -2700 ml   Filed Weights   10/09/22 1920 10/10/22 0252  Weight: 59 kg 59.2 kg    Examination: General: Chronically ill appearing elderly female on mechanical ventilation, in NAD HEENT: ETT, MM pink/moist, PERRL,  Neuro: grossly nonfocal CV: rrr, no murmur, rubs, or gallops,  PULM:  diminished LLL, normal wob GI: soft, nt, nondistended Extremities: warm/dry, edema  Skin: no rashes or lesions  Resolved Hospital Problem list     Assessment & Plan:  Trace left pleural effusion  Acute left lower lobe PE and infarct, likely provoked   Most of LLL opacity is infarcted lung, there is only trace to 1cm effusion with insufficient pocket for safe thoracentesis.   P: Continue IV heparin with transition to oral DOAC, at least 3 month course with re-evaluation of duration in clinic. I will request clinic follow up.   Will sign off but glad to be reinvolved as condition changes.     Best Practice (right click and "Reselect all SmartList Selections" daily)  Per primary   Labs   CBC: Recent Labs  Lab 10/09/22 1708  10/10/22 0410 10/11/22 0301  WBC 17.0* 14.6* 12.0*  HGB 9.1* 8.2* 8.3*  HCT 27.2* 23.9* 23.0*  MCV 83.4 81.0 77.7*  PLT 409* 384 420*    Basic Metabolic Panel: Recent Labs  Lab 10/09/22 1708 10/10/22 0410 10/11/22 0301  NA 132* 135 134*  K 3.3* 3.0* 3.0*  CL 98 103 104  CO2 21* 23 21*  GLUCOSE 107* 88 96  BUN 15 15 9   CREATININE 0.82 0.84 0.75  CALCIUM 9.1 8.4* 8.3*  MG  --  1.6* 1.6*   GFR: Estimated Creatinine Clearance: 64.6 mL/min (by C-G formula based on SCr of 0.75  mg/dL). Recent Labs  Lab 10/09/22 1708 10/09/22 1914 10/10/22 0410 10/11/22 0301  WBC 17.0*  --  14.6* 12.0*  LATICACIDVEN 0.9 1.2  --   --     Liver Function Tests: Recent Labs  Lab 10/09/22 1708  AST 30  ALT 9  ALKPHOS 67  BILITOT 0.7  PROT 7.7  ALBUMIN 2.7*   Recent Labs  Lab 10/09/22 1708  LIPASE 49   No results for input(s): "AMMONIA" in the last 168 hours.  ABG    Component Value Date/Time   TCO2 28 08/31/2013 1601     Coagulation Profile: No results for input(s): "INR", "PROTIME" in the last 168 hours.  Cardiac Enzymes: No results for input(s): "CKTOTAL", "CKMB", "CKMBINDEX", "TROPONINI" in the last 168 hours.  HbA1C: No results found for: "HGBA1C"  CBG: No results for input(s): "GLUCAP" in the last 168 hours.  Review of Systems:   Please see the history of present illness. All other systems reviewed and are negative   Past Medical History:  She,  has a past medical history of Alcohol abuse, Anemia, Aortic atherosclerosis (HCC), Cannabis abuse, Centrilobular emphysema (HCC), Cervical myelopathy (HCC), Hepatitis-C, HLD (hyperlipidemia), HTN (hypertension), Nicotine addiction, and Tobacco abuse.   Surgical History:   Past Surgical History:  Procedure Laterality Date   ABDOMINAL HERNIA REPAIR     POSTERIOR CERVICAL FUSION/FORAMINOTOMY N/A 07/28/2022   Procedure: POSTERIOR CERVICAL DECOMPRESSION, INSTRUMENTATION AND FUSION CERVICAL THREE-CERVICAL SIX;  Surgeon: Dawley, Alan Mulder, DO;  Location: [facility] OR;  Service: Neurosurgery;  Laterality: N/A;     Social History:   reports that she has been smoking cigarettes. She has a 8.00 pack-year smoking history. She has been exposed to tobacco smoke. She has never used smokeless tobacco. She reports current alcohol use of about 42.0 standard drinks of alcohol per week. She reports current drug use. Frequency: 2.00 times per week. Drug: Marijuana.   Family History:  Her family history includes High blood pressure  in her mother.   Allergies No Known Allergies   Home Medications  Prior to Admission medications   Medication Sig Start Date End Date Taking? Authorizing Provider  amLODipine (NORVASC) 5 MG tablet Take 1 tablet (5 mg total) by mouth daily. 08/22/22  Yes Angiulli, Mcarthur Rossetti, PA-C  aspirin EC 81 MG tablet Take 1 tablet (81 mg total) by mouth daily. Swallow whole. 10/03/22  Yes Orbie Pyo, MD  atorvastatin (LIPITOR) 40 MG tablet Take 1 tablet (40 mg total) by mouth daily. 10/03/22  Yes Orbie Pyo, MD  Baclofen 5 MG TABS Take 1 tablet (5 mg total) by mouth 3 (three) times daily. 08/22/22  Yes Angiulli, Mcarthur Rossetti, PA-C  gabapentin (NEURONTIN) 300 MG capsule Take 1 capsule (300 mg total) by mouth 2 (two) times daily. 08/22/22  Yes Angiulli, Mcarthur Rossetti, PA-C  methocarbamol (ROBAXIN) 500 MG tablet Take 1  tablet (500 mg total) by mouth every 6 (six) hours as needed for muscle spasms. 08/22/22  Yes Angiulli, Mcarthur Rossetti, PA-C  Oxycodone HCl 10 MG TABS Take 1 tablet (10 mg total) by mouth every 3 (three) hours as needed for severe pain ((score 7 to 10)). 08/22/22  Yes Angiulli, Mcarthur Rossetti, PA-C  acetaminophen (TYLENOL) 325 MG tablet Take 2 tablets (650 mg total) by mouth every 6 (six) hours as needed for mild pain (or Fever >/= 101). Patient not taking: Reported on 10/10/2022 08/01/22   Joseph Art, DO     Critical care time: NA   [facility]   Personal contact information can be found on Amion  If no contact or response made please call [phone] 10/11/2022, 2:05 PM

## 2022-10-11 NOTE — Progress Notes (Signed)
PROGRESS NOTE Kristen Bautista  ZOX:[id number] DOB: February 01, 1956 DOA: 10/09/2022 PCP: Ollen Bowl, MD  Brief Narrative/Hospital Course: 67 y.o. f w/ aortic atherosclerosis, coronary artery calcification, hepatitis C, hypertension, hyperlipidemia, tobacco abuse, alcohol abuse, cannabis abuse who was admitted to the hospital on 08/01/2022 for C3-C6 severe cervical stenosis with myelopathy concerning for epidural abscess,was treated with IV antibiotics and underwent open posterior cervical lateral mass instrumentation posterior lateral fusion C3-4 4-5 and C5-6 as well as laminotomies evacuation of epidural abscess 07/28/2022 and was discharged to inpatient rehab and stayed there until 3/14 from where discharged on oral antibiotics (Levaquin and doxycycline) for additional 30 days per ID recommendation. She now presented to the ED 10/11/22 w/ epigastric abdominal pain worse with breathing over the past 2 weeks and bilateral lower extremity edema, was tachycardic and tachypneic on arrival to the ED afebrile, not hypotensive. xygen saturation in the low 90s and placed on 2 L supplemental oxygen. Labs showing WBC 17.0, hemoglobin 9.1 (at baseline), platelet count 409k, sodium 132, potassium 3.3, chloride 98, HCO3-21, BUN 15, creatinine 0.8, CBG-107, albumin 2.7, normal lipase and LFTs,troponin negative, lactic acid normal x 2, BNP normal,UA not suggestive of infection.Blood cultures collected.  CTA chest showing left lower lobe PE, moderate left-sided pleural effusion, bibasilar consolidation and volume loss.  CT abdomen pelvis showing cholelithiasis and no acute finding.  Right upper quadrant ultrasound showing cholelithiasis without complicating factors and changes suspicious for early cirrhosis.  Patient had pain and swelling of her right hand/forearm.  X-ray of right wrist showing mild degenerative changes without an acute osseous abnormality.  Patient was started on IV heparin and was given Dilaudid, Zofran, oral  potassium 40 mEq, 1 L LR bolus, vancomycin, and Zosyn. She also complains of epigastric abdominal pain since March worse with taking deep breath and also endorses bilateral lower extremity edema has been present since March Her cousin lives with her   Subjective: Seen and examined this morning.  Complains of ongoing cough with clear sputum Has left-sided pleuritic chest pain Mild abdominal discomfort Overnight intermittently tachycardic in low 100 BP stable saturated well on room air Lab studies still low potassium hemoglobin at 8.3 g WBC downtrending Was using bedside ported this morning and did well Has been weaned off to room air Nursing reports patient has been non complaint w/ safety-She will not stay in chair or bed. She has almost pulled her IV out multiple times from stretching it too far across the room.   Assessment and Plan: Principal Problem:   Acute pulmonary embolism (HCC) Active Problems:   HCAP (healthcare-associated pneumonia)   Pleural effusion   HTN (hypertension)   HLD (hyperlipidemia)   Alcohol abuse  Acute left lower lobe PE: Managing on heparin drip.  Remains hemodynamically stable although mildly tachycardic, echo with preserved EF 55 to 60%, RV systolic function hyperdynamic size normal with normal pulmonary artery systolic pressure, G1 DD.  Upper extremity lower extremity duplex unremarkable.Will transition to DOAC soon.  Moderate left-sided pleural effusion: Likely in the setting of #1 vs parapneumonic effusion< repeat chest x-ray in the morning-resupported has unchanged small left pleural effusion with adjacent left basilar atelectasis -unclear if she will benefit with thoracentesis- will ask pulm to see her.  Bibasilar consolidation/volume loss - concern for pneumonia w/SIRS: Leukocytosis downtrending, afebrile otherwise.  Continue on cefepime, vancomycin -checking MRSA swab and de-escalate antibiotics if negative. Send sputum for culture Recent Labs  Lab  10/09/22 1708 10/09/22 1914 10/10/22 0410 10/11/22 0301  WBC 17.0*  --  14.6* 12.0*  LATICACIDVEN 0.9 1.2  --   --     Cholelithiasis: LFTs unremarkable.  Follow-up outpatient Hypertension: BP well controlled on amlodipine 5 mg Hyperlipidemia: Continue Lipitor Alcohol abuse history-watch for withdrawls, thiamine folate added. Chronic anemia-hemoglobin is stable, maintaining Mild hyponatremia-labs stable Hypokalemia - again replaced  Hypomagnesemia continue Mag-Ox  added mgsulfate iv x1 Hypoalbuminemia: RD consulted. Lower leg edema likely from hypoalbuminemia, edema appears better today. Neg for dvt  DVT prophylaxis: heaprin Code Status:   Code Status: DNR Family Communication: plan of care discussed with patient/son updated on phone 5/1   Patient status is:  inpatient  because of pulmonary embolism although,pneumonia  Level of care: Progressive  Dispo: The patient is from: home            Anticipated disposition: home>2 days Objective: Vitals last 24 hrs: Vitals:   10/10/22 1636 10/10/22 2023 10/11/22 0506 10/11/22 0918  BP:  120/84 (!) 143/88 (!) 149/77  Pulse: (!) 116 99 (!) 113 (!) 101  Resp:   18 20  Temp:  98.4 F (36.9 C) 98.6 F (37 C) 98.4 F (36.9 C)  TempSrc:  Oral Oral Oral  SpO2: 95% 98% 97% 98%  Weight:      Height:       Weight change:   Physical Examination: General exam: alert awake, oriented, older than stated age HEENT:Oral mucosa moist, Ear/Nose WNL grossly Respiratory system: bilaterally diminished BS, no use of accessory muscle Cardiovascular system: S1 & S2 +, No JVD. Gastrointestinal system: Abdomen soft,NT,ND, BS+ Nervous System:Alert, awake, moving extremities. Extremities: LE edema neg,distal peripheral pulses palpable.  Skin: No rashes,no icterus. MSK: Normal muscle bulk,tone, power  Medications reviewed:  Scheduled Meds:  amLODipine  5 mg Oral Daily   atorvastatin  40 mg Oral Daily   folic acid  1 mg Oral Daily   guaiFENesin   600 mg Oral BID   magnesium oxide  400 mg Oral BID   multivitamin with minerals  1 tablet Oral Daily   potassium chloride  40 mEq Oral Once   thiamine  100 mg Oral Daily   Or   thiamine  100 mg Intravenous Daily   Continuous Infusions:  ceFEPime (MAXIPIME) IV 2 g (10/11/22 1121)   heparin 1,400 Units/hr (10/11/22 1124)   vancomycin 1,250 mg (10/10/22 1724)    Diet Order             Diet Heart Room service appropriate? Yes; Fluid consistency: Thin  Diet effective now                  Intake/Output Summary (Last 24 hours) at 10/11/2022 1319 Last data filed at 10/11/2022 1304 Gross per 24 hour  Intake --  Output 2700 ml  Net -2700 ml   Net IO Since Admission: -2,700 mL [10/11/22 1319]  Wt Readings from Last 3 Encounters:  10/10/22 59.2 kg  10/03/22 60.8 kg  08/01/22 55.7 kg     Unresulted Labs (From admission, onward)     Start     Ordered   10/11/22 0942  MRSA Next Gen by PCR, Nasal  (MRSA Screening)  Once,   R        10/11/22 0941   10/11/22 0500  Heparin level (unfractionated)  Daily,   R      10/09/22 2151   10/11/22 0500  Basic metabolic panel  Daily,   R      10/10/22 1308   10/11/22 0500  CBC  Daily,  R      10/10/22 1308   10/10/22 0159  Legionella Pneumophila Serogp 1 Ur Ag  Once,   R        10/10/22 0159          Data Reviewed: I have personally reviewed following labs and imaging studies CBC: Recent Labs  Lab 10/09/22 1708 10/10/22 0410 10/11/22 0301  WBC 17.0* 14.6* 12.0*  HGB 9.1* 8.2* 8.3*  HCT 27.2* 23.9* 23.0*  MCV 83.4 81.0 77.7*  PLT 409* 384 420*   Basic Metabolic Panel: Recent Labs  Lab 10/09/22 1708 10/10/22 0410 10/11/22 0301  NA 132* 135 134*  K 3.3* 3.0* 3.0*  CL 98 103 104  CO2 21* 23 21*  GLUCOSE 107* 88 96  BUN 15 15 9   CREATININE 0.82 0.84 0.75  CALCIUM 9.1 8.4* 8.3*  MG  --  1.6* 1.6*   GFR: Estimated Creatinine Clearance: 64.6 mL/min (by C-G formula based on SCr of 0.75 mg/dL). Liver Function Tests: Recent  Labs  Lab 10/09/22 1708  AST 30  ALT 9  ALKPHOS 67  BILITOT 0.7  PROT 7.7  ALBUMIN 2.7*   Recent Labs  Lab 10/09/22 1708  LIPASE 49   Recent Labs  Lab 10/09/22 1708 10/09/22 1914  LATICACIDVEN 0.9 1.2   Recent Results (from the past 240 hour(s))  Blood culture (routine x 2)     Status: None (Preliminary result)   Collection Time: 10/09/22  5:09 PM   Specimen: BLOOD  Result Value Ref Range Status   Specimen Description BLOOD RIGHT ANTECUBITAL  Final   Special Requests   Final    BOTTLES DRAWN AEROBIC AND ANAEROBIC Blood Culture results may not be optimal due to an excessive volume of blood received in culture bottles   Culture   Final    NO GROWTH 2 DAYS Performed at [facility], [address], [place], [place] [address]    Report Status PENDING  Incomplete  Blood culture (routine x 2)     Status: None (Preliminary result)   Collection Time: 10/09/22  6:44 PM   Specimen: BLOOD LEFT HAND  Result Value Ref Range Status   Specimen Description BLOOD LEFT HAND  Final   Special Requests   Final    BOTTLES DRAWN AEROBIC AND ANAEROBIC Blood Culture adequate volume   Culture   Final    NO GROWTH 2 DAYS Performed at [facility], [address] [place], [place] [address]    Report Status PENDING  Incomplete  SARS Coronavirus 2 by RT PCR (hospital order, performed in [facility] hospital lab) *cepheid single result test*     Status: None   Collection Time: 10/10/22 11:05 AM  Result Value Ref Range Status   SARS Coronavirus 2 by RT PCR NEGATIVE NEGATIVE Final    Comment: Performed at [facility], [address], [place], [place] [address]    Antimicrobials: Anti-infectives (From admission, onward)    Start     Dose/Rate Route Frequency Ordered Stop   10/10/22 1800  vancomycin (VANCOREADY) IVPB 1250 mg/250 mL        1,250 mg 166.7 mL/hr over 90 Minutes Intravenous Every 24 hours 10/09/22 2223     10/10/22 1200  ceFEPIme (MAXIPIME) 2 g in sodium  chloride 0.9 % 100 mL IVPB        2 g 200 mL/hr over 30 Minutes Intravenous Every 8 hours 10/10/22 0218     10/10/22 0230  ceFEPIme (  MAXIPIME) 2 g in sodium chloride 0.9 % 100 mL IVPB        2 g 200 mL/hr over 30 Minutes Intravenous  Once 10/10/22 0218 10/10/22 0423   10/09/22 1845  vancomycin (VANCOREADY) IVPB 1250 mg/250 mL        1,250 mg 166.7 mL/hr over 90 Minutes Intravenous  Once 10/09/22 1837 10/09/22 2210   10/09/22 1815  piperacillin-tazobactam (ZOSYN) IVPB 3.375 g        3.375 g 100 mL/hr over 30 Minutes Intravenous  Once 10/09/22 1811 10/09/22 1858      Culture/Microbiology    Component Value Date/Time   SDES BLOOD LEFT HAND 10/09/2022 1844   SPECREQUEST  10/09/2022 1844    BOTTLES DRAWN AEROBIC AND ANAEROBIC Blood Culture adequate volume   CULT  10/09/2022 1844    NO GROWTH 2 DAYS Performed at [facility], [address] [place], [place] [address]    REPTSTATUS PENDING 10/09/2022 1844  Radiology Studies: DG Chest 2 View  Result Date: 10/11/2022 CLINICAL DATA:  Pleural effusion. EXAM: CHEST - 2 VIEW COMPARISON:  Chest radiograph 07/20/2022.  Chest CT 10/09/2022. FINDINGS: Unchanged small left pleural effusion with adjacent left basilar atelectasis. Stable cardiac and mediastinal contours. No pneumothorax. IMPRESSION: Unchanged small left pleural effusion with adjacent left basilar atelectasis. Electronically Signed   By: Orvan Falconer M.D.   On: 10/11/2022 08:31   VAS [place] LOWER EXTREMITY VENOUS (DVT)  Result Date: 10/10/2022  Lower Venous DVT Study Patient Name:  Kristen Bautista  Date of Exam:   10/10/2022 Medical Rec #: [id number]       Accession #:    [id number] Date of Birth: 03/18/1956       Patient Gender: F Patient Age:   67 years Exam Location:  [facility] Procedure:      VAS [place] LOWER EXTREMITY VENOUS (DVT) Referring Phys: Ulyess Blossom RATHORE --------------------------------------------------------------------------------  Indications: Pulmonary embolism.   Comparison Study: 08-01-2022 Prior bilateral lower extremity venous study was                   negative for DVT. Performing Technologist: Jean Rosenthal RDMS, RVT  Examination Guidelines: A complete evaluation includes B-mode imaging, spectral Doppler, color Doppler, and power Doppler as needed of all accessible portions of each vessel. Bilateral testing is considered an integral part of a complete examination. Limited examinations for reoccurring indications may be performed as noted. The reflux portion of the exam is performed with the patient in reverse Trendelenburg.  +---------+---------------+---------+-----------+----------+--------------+ RIGHT    CompressibilityPhasicitySpontaneityPropertiesThrombus Aging +---------+---------------+---------+-----------+----------+--------------+ CFV      Full           Yes      Yes                                 +---------+---------------+---------+-----------+----------+--------------+ SFJ      Full                                                        +---------+---------------+---------+-----------+----------+--------------+ FV Prox  Full                                                        +---------+---------------+---------+-----------+----------+--------------+  FV Mid   Full                                                        +---------+---------------+---------+-----------+----------+--------------+ FV DistalFull                                                        +---------+---------------+---------+-----------+----------+--------------+ PFV      Full                                                        +---------+---------------+---------+-----------+----------+--------------+ POP      Full           Yes      Yes                                 +---------+---------------+---------+-----------+----------+--------------+ PTV      Full                                                         +---------+---------------+---------+-----------+----------+--------------+ PERO     Full                                                        +---------+---------------+---------+-----------+----------+--------------+   +---------+---------------+---------+-----------+----------+--------------+ LEFT     CompressibilityPhasicitySpontaneityPropertiesThrombus Aging +---------+---------------+---------+-----------+----------+--------------+ CFV      Full           Yes      Yes                                 +---------+---------------+---------+-----------+----------+--------------+ SFJ      Full                                                        +---------+---------------+---------+-----------+----------+--------------+ FV Prox  Full                                                        +---------+---------------+---------+-----------+----------+--------------+ FV Mid   Full                                                        +---------+---------------+---------+-----------+----------+--------------+  FV DistalFull                                                        +---------+---------------+---------+-----------+----------+--------------+ PFV      Full                                                        +---------+---------------+---------+-----------+----------+--------------+ POP      Full           Yes      Yes                                 +---------+---------------+---------+-----------+----------+--------------+ PTV      Full                                                        +---------+---------------+---------+-----------+----------+--------------+ PERO     Full                                                        +---------+---------------+---------+-----------+----------+--------------+     Summary: RIGHT: - There is no evidence of deep vein thrombosis in the lower extremity.  - No cystic structure found in  the popliteal fossa.  LEFT: - There is no evidence of deep vein thrombosis in the lower extremity.  - No cystic structure found in the popliteal fossa.  *See table(s) above for measurements and observations. Electronically signed by Coral Else MD on 10/10/2022 at 10:14:51 PM.    Final    VAS [place] UPPER EXTREMITY VENOUS DUPLEX  Result Date: 10/10/2022 UPPER VENOUS STUDY  Patient Name:  Kristen Bautista  Date of Exam:   10/10/2022 Medical Rec #: [id number]       Accession #:    [id number] Date of Birth: 05/30/1956       Patient Gender: F Patient Age:   54 years Exam Location:  [facility] Procedure:      VAS [place] UPPER EXTREMITY VENOUS DUPLEX Referring Phys: Ulyess Blossom RATHORE --------------------------------------------------------------------------------  Indications: Right hand swelling, PE Comparison Study: No prior studies. Performing Technologist: Jean Rosenthal RDMS, RVT  Examination Guidelines: A complete evaluation includes B-mode imaging, spectral Doppler, color Doppler, and power Doppler as needed of all accessible portions of each vessel. Bilateral testing is considered an integral part of a complete examination. Limited examinations for reoccurring indications may be performed as noted.  Right Findings: +----------+------------+---------+-----------+----------+-------+ RIGHT     CompressiblePhasicitySpontaneousPropertiesSummary +----------+------------+---------+-----------+----------+-------+ IJV           Full       Yes       Yes                      +----------+------------+---------+-----------+----------+-------+ Subclavian    Full  Yes       Yes                      +----------+------------+---------+-----------+----------+-------+ Axillary      Full       Yes       Yes                      +----------+------------+---------+-----------+----------+-------+ Brachial      Full                                           +----------+------------+---------+-----------+----------+-------+ Radial        Full                                          +----------+------------+---------+-----------+----------+-------+ Ulnar         Full                                          +----------+------------+---------+-----------+----------+-------+ Cephalic      Full                                          +----------+------------+---------+-----------+----------+-------+ Basilic       Full                                          +----------+------------+---------+-----------+----------+-------+  Summary:  Right: No evidence of deep vein thrombosis in the upper extremity. No evidence of superficial vein thrombosis in the upper extremity.  *See table(s) above for measurements and observations.  Diagnosing physician: Coral Else MD Electronically signed by Coral Else MD on 10/10/2022 at 10:13:56 PM.    Final    ECHOCARDIOGRAM COMPLETE  Result Date: 10/10/2022    ECHOCARDIOGRAM REPORT   Patient Name:   Kristen Bautista Date of Exam: 10/10/2022 Medical Rec #:  [id number]      Height:       67.0 in Accession #:    [id number]     Weight:       130.6 lb Date of Birth:  November 28, 1955      BSA:          1.687 m Patient Age:    67 years       BP:           140/63 mmHg Patient Gender: F              HR:           93 bpm. Exam Location:  Inpatient Procedure: 2D Echo, Cardiac Doppler and Color Doppler Indications:    I26.02 Pulmonary embolus  History:        Patient has no prior history of Echocardiogram examinations.                 Risk Factors:Hypertension and Dyslipidemia.  Sonographer:    Mike Gip Referring Phys: Miquela Costabile IMPRESSIONS  1. Left ventricular ejection fraction, by estimation, is 55 to 60%.  The left ventricle has normal function. The left ventricle has no regional wall motion abnormalities. Left ventricular diastolic parameters are consistent with Grade I diastolic dysfunction (impaired relaxation).  2. Right  ventricular systolic function is hyperdynamic. The right ventricular size is normal. There is normal pulmonary artery systolic pressure.  3. The mitral valve is normal in structure. Mild to moderate mitral valve regurgitation. No evidence of mitral stenosis.  4. The aortic valve is tricuspid. Aortic valve regurgitation is not visualized. No aortic stenosis is present.  5. The inferior vena cava is normal in size with greater than 50% respiratory variability, suggesting right atrial pressure of 3 mmHg. Comparison(s): No prior Echocardiogram. FINDINGS  Left Ventricle: Left ventricular ejection fraction, by estimation, is 55 to 60%. The left ventricle has normal function. The left ventricle has no regional wall motion abnormalities. The left ventricular internal cavity size was normal in size. There is  no left ventricular hypertrophy. Left ventricular diastolic parameters are consistent with Grade I diastolic dysfunction (impaired relaxation). Right Ventricle: The right ventricular size is normal. No increase in right ventricular wall thickness. Right ventricular systolic function is hyperdynamic. There is normal pulmonary artery systolic pressure. The tricuspid regurgitant velocity is 2.21 m/s, and with an assumed right atrial pressure of 3 mmHg, the estimated right ventricular systolic pressure is 22.5 mmHg. Left Atrium: Left atrial size was normal in size. Right Atrium: Right atrial size was normal in size. Pericardium: There is no evidence of pericardial effusion. Mitral Valve: The mitral valve is normal in structure. Mild to moderate mitral valve regurgitation. No evidence of mitral valve stenosis. Tricuspid Valve: The tricuspid valve is normal in structure. Tricuspid valve regurgitation is trivial. No evidence of tricuspid stenosis. Aortic Valve: The aortic valve is tricuspid. Aortic valve regurgitation is not visualized. No aortic stenosis is present. Pulmonic Valve: The pulmonic valve was not well visualized.  Pulmonic valve regurgitation is not visualized. No evidence of pulmonic stenosis. Aorta: The aortic root is normal in size and structure and the ascending aorta was not well visualized. Venous: The inferior vena cava is normal in size with greater than 50% respiratory variability, suggesting right atrial pressure of 3 mmHg. IAS/Shunts: No atrial level shunt detected by color flow Doppler.  LEFT VENTRICLE PLAX 2D LVIDd:         4.80 cm      Diastology LVIDs:         3.70 cm      LV e' medial:    10.90 cm/s LV PW:         0.90 cm      LV E/e' medial:  9.5 LV IVS:        0.80 cm      LV e' lateral:   13.60 cm/s LVOT diam:     2.00 cm      LV E/e' lateral: 7.6 LV SV:         60 LV SV Index:   36 LVOT Area:     3.14 cm  LV Volumes (MOD) LV vol d, MOD A2C: 103.0 ml LV vol d, MOD A4C: 95.4 ml LV vol s, MOD A2C: 48.7 ml LV vol s, MOD A4C: 48.6 ml LV SV MOD A2C:     54.3 ml LV SV MOD A4C:     95.4 ml LV SV MOD BP:      49.7 ml RIGHT VENTRICLE             IVC RV Basal diam:  3.50 cm  IVC diam: 1.90 cm RV S prime:     16.20 cm/s TAPSE (M-mode): 2.1 cm LEFT ATRIUM             Index        RIGHT ATRIUM           Index LA diam:        2.90 cm 1.72 cm/m   RA Area:     13.20 cm LA Vol (A2C):   52.7 ml 31.23 ml/m  RA Volume:   36.20 ml  21.46 ml/m LA Vol (A4C):   38.3 ml 22.70 ml/m LA Biplane Vol: 46.1 ml 27.32 ml/m  AORTIC VALVE LVOT Vmax:   104.00 cm/s LVOT Vmean:  65.000 cm/s LVOT VTI:    0.192 m  AORTA Ao Root diam: 2.70 cm Ao Asc diam:  2.80 cm MITRAL VALVE                TRICUSPID VALVE MV Area (PHT): 4.74 cm     TR Peak grad:   19.5 mmHg MV Decel Time: 160 msec     TR Vmax:        221.00 cm/s MV E velocity: 104.00 cm/s MV A velocity: 114.00 cm/s  SHUNTS MV E/A ratio:  0.91         Systemic VTI:  0.19 m                             Systemic Diam: 2.00 cm Riley Lam MD Electronically signed by Riley Lam MD Signature Date/Time: 10/10/2022/11:47:56 AM    Final    US Abdomen Limited RUQ  (LIVER/GB)  Result Date: 10/09/2022 CLINICAL DATA:  Right upper quadrant pain EXAM: ULTRASOUND ABDOMEN LIMITED RIGHT UPPER QUADRANT COMPARISON:  CT from earlier in the same day. FINDINGS: Gallbladder: Gallbladder is well distended. Multiple gallstones are noted within. No wall thickening or pericholecystic fluid is noted. Negative sonographic Murphy's sign is elicited. Common bile duct: Diameter: 3.3 mm. Liver: Mild nodularity is noted similar to that seen on the prior CT examination. No focal mass is noted. Portal vein is patent on color Doppler imaging with normal direction of blood flow towards the liver. Other: None. IMPRESSION: Cholelithiasis without complicating factors. Changes suspicious for early cirrhosis. Electronically Signed   By: Alcide Clever M.D.   On: 10/09/2022 21:50   CT Angio Chest PE W and/or Wo Contrast  Result Date: 10/09/2022 CLINICAL DATA:  Pleurisy, abdominal pain, vomiting. EXAM: CT ANGIOGRAPHY CHEST CT ABDOMEN AND PELVIS WITH CONTRAST TECHNIQUE: Multidetector CT imaging of the chest was performed using the standard protocol during bolus administration of intravenous contrast. Multiplanar CT image reconstructions and MIPs were obtained to evaluate the vascular anatomy. Multidetector CT imaging of the abdomen and pelvis was performed using the standard protocol during bolus administration of intravenous contrast. RADIATION DOSE REDUCTION: This exam was performed according to the departmental dose-optimization program which includes automated exposure control, adjustment of the mA and/or kV according to patient size and/or use of iterative reconstruction technique. CONTRAST:  75mL OMNIPAQUE IOHEXOL 350 MG/ML SOLN COMPARISON:  None Available. FINDINGS: CTA CHEST FINDINGS Cardiovascular: Mild cardiomegaly. No pericardial effusion. No aortic aneurysm or dissection. Atheromatous calcifications of the aorta and coronary arteries. No RV enlargement or reflux of contrast to the IVC to suggest  insufficiency of the tricuspid or right heart strain. Filling defects identified in left lower lobe branch pulmonary arteries consistent with PE. Mediastinum/Nodes: No enlarged mediastinal, hilar, or axillary  lymph nodes. Thyroid gland, trachea, and esophagus demonstrate no significant findings. Lungs/Pleura: Moderate left-sided pleural effusion. Bibasilar subsegmental atelectasis. No pneumothorax. Consolidation bilateral lower lobes. Musculoskeletal: No chest wall abnormality. No acute or significant osseous findings. Review of the MIP images confirms the above findings. CT ABDOMEN and PELVIS FINDINGS Hepatobiliary: Calcified gallstone. No hepatic parenchymal lesions or biliary ductal dilatation. Gallbladder demonstrates a somewhat nodular configuration suggestive of cirrhosis, and clinical correlation recommended. Pancreas: Unremarkable. No pancreatic ductal dilatation or surrounding inflammatory changes. Spleen: Normal in size without focal abnormality. Adrenals/Urinary Tract: Adrenal glands are unremarkable. Kidneys are normal, without renal calculi, focal lesion, or hydronephrosis. There are bilateral extrarenal pelves. Bladder is unremarkable. Stomach/Bowel: Stomach is within normal limits. Appendix appears normal. No evidence of bowel wall thickening, distention, or inflammatory changes. Vascular/Lymphatic: Aortic atherosclerosis. No enlarged abdominal or pelvic lymph nodes. Reproductive: Uterus and bilateral adnexa are unremarkable. Other: No abdominal wall hernia or abnormality. No abdominopelvic ascites. Musculoskeletal: No acute or significant osseous findings. L4-5 degenerative disc disease with vacuum disc changes. Review of the MIP images confirms the above findings. IMPRESSION: 1. Left lower lobe pulmonary emboli. 2. Left-sided pleural effusion. 3. Bibasilar consolidation and volume loss. 4. Cholelithiasis. Findings were discussed with and acknowledged by Dr. Elpidio Anis at 8:50 p.m. 10/09/2022.  Electronically Signed   By: Layla Maw M.D.   On: 10/09/2022 20:55   CT ABDOMEN PELVIS W CONTRAST  Result Date: 10/09/2022 CLINICAL DATA:  Pleurisy, abdominal pain, vomiting. EXAM: CT ANGIOGRAPHY CHEST CT ABDOMEN AND PELVIS WITH CONTRAST TECHNIQUE: Multidetector CT imaging of the chest was performed using the standard protocol during bolus administration of intravenous contrast. Multiplanar CT image reconstructions and MIPs were obtained to evaluate the vascular anatomy. Multidetector CT imaging of the abdomen and pelvis was performed using the standard protocol during bolus administration of intravenous contrast. RADIATION DOSE REDUCTION: This exam was performed according to the departmental dose-optimization program which includes automated exposure control, adjustment of the mA and/or kV according to patient size and/or use of iterative reconstruction technique. CONTRAST:  75mL OMNIPAQUE IOHEXOL 350 MG/ML SOLN COMPARISON:  None Available. FINDINGS: CTA CHEST FINDINGS Cardiovascular: Mild cardiomegaly. No pericardial effusion. No aortic aneurysm or dissection. Atheromatous calcifications of the aorta and coronary arteries. No RV enlargement or reflux of contrast to the IVC to suggest insufficiency of the tricuspid or right heart strain. Filling defects identified in left lower lobe branch pulmonary arteries consistent with PE. Mediastinum/Nodes: No enlarged mediastinal, hilar, or axillary lymph nodes. Thyroid gland, trachea, and esophagus demonstrate no significant findings. Lungs/Pleura: Moderate left-sided pleural effusion. Bibasilar subsegmental atelectasis. No pneumothorax. Consolidation bilateral lower lobes. Musculoskeletal: No chest wall abnormality. No acute or significant osseous findings. Review of the MIP images confirms the above findings. CT ABDOMEN and PELVIS FINDINGS Hepatobiliary: Calcified gallstone. No hepatic parenchymal lesions or biliary ductal dilatation. Gallbladder demonstrates a  somewhat nodular configuration suggestive of cirrhosis, and clinical correlation recommended. Pancreas: Unremarkable. No pancreatic ductal dilatation or surrounding inflammatory changes. Spleen: Normal in size without focal abnormality. Adrenals/Urinary Tract: Adrenal glands are unremarkable. Kidneys are normal, without renal calculi, focal lesion, or hydronephrosis. There are bilateral extrarenal pelves. Bladder is unremarkable. Stomach/Bowel: Stomach is within normal limits. Appendix appears normal. No evidence of bowel wall thickening, distention, or inflammatory changes. Vascular/Lymphatic: Aortic atherosclerosis. No enlarged abdominal or pelvic lymph nodes. Reproductive: Uterus and bilateral adnexa are unremarkable. Other: No abdominal wall hernia or abnormality. No abdominopelvic ascites. Musculoskeletal: No acute or significant osseous findings. L4-5 degenerative disc disease with vacuum disc changes. Review of  the MIP images confirms the above findings. IMPRESSION: 1. Left lower lobe pulmonary emboli. 2. Left-sided pleural effusion. 3. Bibasilar consolidation and volume loss. 4. Cholelithiasis. Findings were discussed with and acknowledged by Dr. Elpidio Anis at 8:50 p.m. 10/09/2022. Electronically Signed   By: Layla Maw M.D.   On: 10/09/2022 20:55   DG Wrist Complete Right  Result Date: 10/09/2022 CLINICAL DATA:  Right wrist pain and swelling. EXAM: RIGHT WRIST - COMPLETE 3+ VIEW COMPARISON:  March 18, 2021 FINDINGS: There is no evidence of an acute fracture or dislocation. Mild degenerative changes are seen involving the distal aspect of the right wrist. Mild, stable degenerative changes are also seen along the ulnocarpal joint. Mild dorsal soft tissue swelling is noted. IMPRESSION: Mild degenerative changes without an acute osseous abnormality. Electronically Signed   By: Aram Candela M.D.   On: 10/09/2022 18:44    LOS: 1 day   Lanae Boast, MD [facility] 10/11/2022, 1:19 PM

## 2022-10-11 NOTE — Progress Notes (Signed)
ANTICOAGULATION CONSULT NOTE Pharmacy Consult for Heparin Indication: pulmonary embolus   No Known Allergies  Patient Measurements: Height: 5\' 7"  (170.2 cm) Weight: 59.2 kg (130 lb 9.6 oz) IBW/kg (Calculated) : 61.6 Heparin Dosing Weight: 59 kg  Vital Signs: Temp: 98.4 F (36.9 C) (05/02 0918) Temp Source: Oral (05/02 0918) BP: 149/77 (05/02 0918) Pulse Rate: 101 (05/02 0918)  Labs: Recent Labs    10/09/22 1708 10/09/22 1708 10/10/22 0410 10/10/22 1202 10/10/22 1815 10/11/22 0301  HGB 9.1*  --  8.2*  --   --  8.3*  HCT 27.2*  --  23.9*  --   --  23.0*  PLT 409*  --  384  --   --  420*  HEPARINUNFRC  --    < > 0.12* 0.45 0.25* 0.46  CREATININE 0.82  --  0.84  --   --  0.75  TROPONINIHS 8  --  9  --   --   --    < > = values in this interval not displayed.     Estimated Creatinine Clearance: 64.6 mL/min (by C-G formula based on SCr of 0.75 mg/dL).   Assessment: 67 y.o. female with PE on  heparin. She is noted with pleural effusion  and plans noted for possible thoracentesis -heparin level at goal on 1400 units/hr, CBC stable   Goal of Therapy:  Heparin level 0.3-0.7 units/ml Monitor platelets by anticoagulation protocol: Yes   Plan:  -Continue heparin at 1400 units/hr -Daily heparin level and CBC  Harland German, PharmD Clinical Pharmacist **Pharmacist phone directory can now be found on [email or web address] ([email or web address]).  Listed under [facility].

## 2022-10-11 NOTE — Telephone Encounter (Signed)
Can we set up a follow up appointment in 2-3 months with any MD?  Thanks!

## 2022-10-12 DIAGNOSIS — I2699 Other pulmonary embolism without acute cor pulmonale: Secondary | ICD-10-CM | POA: Diagnosis not present

## 2022-10-12 LAB — LEGIONELLA PNEUMOPHILA SEROGP 1 UR AG: L. pneumophila Serogp 1 Ur Ag: NEGATIVE

## 2022-10-12 LAB — CBC
HCT: 24.8 % — ABNORMAL LOW (ref 36.0–46.0)
Hemoglobin: 9 g/dL — ABNORMAL LOW (ref 12.0–15.0)
MCH: 28.3 pg (ref 26.0–34.0)
MCHC: 36.3 g/dL — ABNORMAL HIGH (ref 30.0–36.0)
MCV: 78 fL — ABNORMAL LOW (ref 80.0–100.0)
Platelets: 491 10*3/uL — ABNORMAL HIGH (ref 150–400)
RBC: 3.18 MIL/uL — ABNORMAL LOW (ref 3.87–5.11)
RDW: 13.8 % (ref 11.5–15.5)
WBC: 13.7 10*3/uL — ABNORMAL HIGH (ref 4.0–10.5)
nRBC: 0 % (ref 0.0–0.2)

## 2022-10-12 LAB — BASIC METABOLIC PANEL
Anion gap: 11 (ref 5–15)
BUN: 7 mg/dL — ABNORMAL LOW (ref 8–23)
CO2: 21 mmol/L — ABNORMAL LOW (ref 22–32)
Calcium: 8.7 mg/dL — ABNORMAL LOW (ref 8.9–10.3)
Chloride: 103 mmol/L (ref 98–111)
Creatinine, Ser: 0.76 mg/dL (ref 0.44–1.00)
GFR, Estimated: >60 mL/min (ref >60)
Glucose, Bld: 95 mg/dL (ref 70–99)
Potassium: 3.7 mmol/L (ref 3.5–5.1)
Sodium: 135 mmol/L (ref 135–145)

## 2022-10-12 LAB — PROCALCITONIN: Procalcitonin: 0.3 ng/mL

## 2022-10-12 LAB — HEPARIN LEVEL (UNFRACTIONATED): Heparin Unfractionated: 0.55 IU/mL (ref 0.30–0.70)

## 2022-10-12 MED ORDER — APIXABAN 5 MG PO TABS
10.0000 mg | ORAL_TABLET | Freq: Two times a day (BID) | ORAL | Status: DC
  Administered 2022-10-12 – 2022-10-13 (×3): 10 mg via ORAL
  Filled 2022-10-12 (×3): qty 2

## 2022-10-12 MED ORDER — METOPROLOL TARTRATE 12.5 MG HALF TABLET
12.5000 mg | ORAL_TABLET | Freq: Two times a day (BID) | ORAL | Status: DC
  Administered 2022-10-12 (×2): 12.5 mg via ORAL
  Filled 2022-10-12 (×2): qty 1

## 2022-10-12 MED ORDER — APIXABAN 5 MG PO TABS: 5.0000 mg | ORAL_TABLET | Freq: Two times a day (BID) | ORAL | Status: DC

## 2022-10-12 NOTE — Telephone Encounter (Signed)
ATC X1 LVM for patient to call the office back. Please schedule new pt apt with any MD in 2-3 months

## 2022-10-12 NOTE — Progress Notes (Signed)
OT Cancellation Note  Patient Details Name: Kristen Bautista MRN: [id number] DOB: 20-Dec-1955   Cancelled Treatment:    Reason Eval/Treat Not Completed: Patient not medically ready (RN reports patient walked well with her but following walk was experiencing chest pains with HR in the 150s. OT to continue to f/u with patient as able)  10/12/2022  AB, OTR/L  Acute Rehabilitation Services  Office: [phone]   Tristan Schroeder 10/12/2022, 2:59 PM

## 2022-10-12 NOTE — Discharge Instructions (Signed)
Information on my medicine - ELIQUIS (apixaban)  This medication education was reviewed with me or my healthcare representative as part of my discharge preparation.  The pharmacist that spoke with me during my hospital stay was:  Mosetta Anis, [facility]  Why was Eliquis prescribed for you? Eliquis was prescribed to treat blood clots that may have been found in the veins of your legs (deep vein thrombosis) or in your lungs (pulmonary embolism) and to reduce the risk of them occurring again.  What do You need to know about Eliquis ? The starting dose is 10 mg (two 5 mg tablets) taken TWICE daily for the FIRST SEVEN (7) DAYS, then on (enter date)  10/19/22  the dose is reduced to ONE 5 mg tablet taken TWICE daily.  Eliquis may be taken with or without food.   Try to take the dose about the same time in the morning and in the evening. If you have difficulty swallowing the tablet whole please discuss with your pharmacist how to take the medication safely.  Take Eliquis exactly as prescribed and DO NOT stop taking Eliquis without talking to the doctor who prescribed the medication.  Stopping may increase your risk of developing a new blood clot.  Refill your prescription before you run out.  After discharge, you should have regular check-up appointments with your healthcare provider that is prescribing your Eliquis.    What do you do if you miss a dose? If a dose of ELIQUIS is not taken at the scheduled time, take it as soon as possible on the same day and twice-daily administration should be resumed. The dose should not be doubled to make up for a missed dose.  Important Safety Information A possible side effect of Eliquis is bleeding. You should call your healthcare provider right away if you experience any of the following: Bleeding from an injury or your nose that does not stop. Unusual colored urine (red or dark [place]) or unusual colored stools (red or black). Unusual bruising for  unknown reasons. A serious fall or if you hit your head (even if there is no bleeding).  Some medicines may interact with Eliquis and might increase your risk of bleeding or clotting while on Eliquis. To help avoid this, consult your healthcare provider or pharmacist prior to using any new prescription or non-prescription medications, including herbals, vitamins, non-steroidal anti-inflammatory drugs (NSAIDs) and supplements.  This website has more information on Eliquis (apixaban): http://www.eliquis.com/eliquis/home

## 2022-10-12 NOTE — Progress Notes (Signed)
SATURATION QUALIFICATIONS: (This note is used to comply with regulatory documentation for home oxygen)  Patient Saturations on Room Air at Rest = 97%  Patient Saturations on Room Air while Ambulating = 81%  Patient Saturations on 6 Liters of oxygen while Ambulating = 91%  Please briefly explain why patient needs home oxygen:  Patient is requiring 6L of oxygen while walking 500+ feet. Denies shortness of breath and chest pain.

## 2022-10-12 NOTE — Progress Notes (Addendum)
ANTICOAGULATION CONSULT NOTE  Pharmacy Consult for Heparin Indication: pulmonary embolus   No Known Allergies  Patient Measurements: Height: 5\' 7"  (170.2 cm) Weight: 59.2 kg (130 lb 9.6 oz) IBW/kg (Calculated) : 61.6 Heparin Dosing Weight: 59 kg  Vital Signs: Temp: 98.4 F (36.9 C) (05/03 0724) Temp Source: Oral (05/03 0724) BP: 140/66 (05/03 0724) Pulse Rate: 107 (05/03 0724)  Labs: Recent Labs    10/09/22 1708 10/10/22 0410 10/10/22 1202 10/10/22 1815 10/11/22 0301 10/12/22 0329  HGB 9.1* 8.2*  --   --  8.3* 9.0*  HCT 27.2* 23.9*  --   --  23.0* 24.8*  PLT 409* 384  --   --  420* 491*  HEPARINUNFRC  --  0.12*   < > 0.25* 0.46 0.55  CREATININE 0.82 0.84  --   --  0.75 0.76  TROPONINIHS 8 9  --   --   --   --    < > = values in this interval not displayed.     Estimated Creatinine Clearance: 64.6 mL/min (by C-G formula based on SCr of 0.76 mg/dL).   Assessment: 67 y.o. female with PE on Iv heparin. Heparin level therapeutic at 0.55, CBC stable.   Goal of Therapy:  Heparin level 0.3-0.7 units/ml Monitor platelets by anticoagulation protocol: Yes   Plan:  -Continue heparin at 1400 units/hr -Daily heparin level and CBC   ADDENDUM 0945: will transition to apixaban. CBC, Cr ok. Stop heparin, begin apixaban 10mg  BID x7d then 5mg  BID.  Fredonia Highland, PharmD, BCPS, [facility] Clinical Pharmacist [phone] Please check AMION for all [facility] Pharmacy numbers 10/12/2022

## 2022-10-12 NOTE — Progress Notes (Signed)
PT Cancellation Note  Patient Details Name: Kristen Bautista MRN: [id number] DOB: 1955-10-16   Cancelled Treatment:    Reason Eval/Treat Not Completed: Medical issues which prohibited therapy (Checked in with RN who states that pt is currently having chest pain and resting HR in the 150s. Will try again tomorrow when medically stable. Pt already ambulated 500+ ft with RN earlier.)   Gladys Damme 10/12/2022, 2:07 PM

## 2022-10-12 NOTE — Progress Notes (Addendum)
PROGRESS NOTE HARLEYANN EDLING  KVQ:[id number] DOB: 06-28-1955 DOA: 10/09/2022 PCP: Ollen Bowl, MD  Brief Narrative/Hospital Course: 67 y.o. f w/ aortic atherosclerosis, coronary artery calcification, hepatitis C, hypertension, hyperlipidemia, tobacco abuse, alcohol abuse, cannabis abuse who was admitted to the hospital on 08/01/2022 for C3-C6 severe cervical stenosis with myelopathy concerning for epidural abscess,was treated with IV antibiotics and underwent open posterior cervical lateral mass instrumentation posterior lateral fusion C3-4 4-5 and C5-6 as well as laminotomies evacuation of epidural abscess 07/28/2022 and was discharged to inpatient rehab and stayed there until 3/14 from where discharged on oral antibiotics (Levaquin and doxycycline) for additional 30 days per ID recommendation. She now presented to the ED 10/11/22 w/ epigastric abdominal pain worse with breathing over the past 2 weeks and bilateral lower extremity edema, was tachycardic and tachypneic on arrival to the ED afebrile, not hypotensive. xygen saturation in the low 90s and placed on 2 L supplemental oxygen. Labs showing WBC 17.0, hemoglobin 9.1 (at baseline), platelet count 409k, sodium 132, potassium 3.3, chloride 98, HCO3-21, BUN 15, creatinine 0.8, CBG-107, albumin 2.7, normal lipase and LFTs,troponin negative, lactic acid normal x 2, BNP normal,UA not suggestive of infection.Blood cultures collected.  CTA chest showing left lower lobe PE, moderate left-sided pleural effusion, bibasilar consolidation and volume loss.  CT abdomen pelvis showing cholelithiasis and no acute finding.  Right upper quadrant ultrasound showing cholelithiasis without complicating factors and changes suspicious for early cirrhosis.  Patient had pain and swelling of her right hand/forearm.  X-ray of right wrist showing mild degenerative changes without an acute osseous abnormality.  Patient was started on IV heparin and was given Dilaudid, Zofran, oral  potassium 40 mEq, 1 L LR bolus, vancomycin, and Zosyn. Patient managed with IV heparin drip, repeat chest x-ray much better seen by pulmonary critical care> fPCCM felt most of LLL opacity infarcted lung with only trace of 1 cm effusion with insufficient pocket for safe thoracentesis, advised transition to DOAC at least 3 months course with reevaluation for duration of clinic and clinic will follow-up as outpatient.    Subjective: Patient reports she overall feels much better Some pain on the left side on deep breath Was doing well on room air however with ambulation hypoxic at 81% needing 6 L, also good to go tachycardic up to 154   Assessment and Plan: Principal Problem:   Acute pulmonary embolism (HCC) Active Problems:   HCAP (healthcare-associated pneumonia)   Pleural effusion   HTN (hypertension)   HLD (hyperlipidemia)   Alcohol abuse  Acute left lower lobe PE: Transition to Eliquis, appreciate pulmonary input continue for 3 months will need follow-up with his [facility] duration with pulmonary. remains hemodynamically stable although mildly tachycardic, echo with preserved EF 55 to 60%, RV systolic function hyperdynamic size normal with normal pulmonary artery systolic pressure, G1 DD.  Upper extremity lower extremity duplex unremarkable.Will transition to DOAC soon.  Moderate left-sided pleural effusion Bibasilar consolidation volume loss: Likely in the setting of #1 with pulmonary infarct per pulmonary, procalcitonin is reassuring less likely infectious etiology iv antibiotic discontinued. Sepsis ruled out. Has mild leukocytosis likely reactive continue to monitor as outpatient  Asymptomatic cholelithiasis: LFTs unremarkable.  Ultrasound no other suspicious finding Possible early signs of cirrhosis on the ultrasound follow-up with PCP/ Hypertension: BP well controlled on amlodipine 5 mg Hyperlipidemia: Continue Lipitor Alcohol abuse history-watch for withdrawls.  Doing well  over Chronic anemia-hemoglobin is stable, continue to need to  mild hyponatremia-stable Hypokalemia - replaced  Hypomagnesemia continue  Mag-Ox Hypoalbuminemia:RD consulted. Lower leg edema likely from hypoalbuminemia, edema appears improved. Doppler is neg for DVT  DVT prophylaxis: hepArin Code Status:   Code Status: DNR Family Communication: plan of care discussed with patient/son  Everlean Alstrom updated 5/1- called again no answer.   Patient status is:  inpatient  because of pulmonary embolism Level of care: Progressive  Dispo: The patient is from: home            Anticipated disposition: home TOMORROW  Objective: Vitals last 24 hrs: Vitals:   10/11/22 1924 10/11/22 2359 10/12/22 0332 10/12/22 0724  BP: (!) 143/72 128/75 134/64 (!) 140/66  Pulse: (!) 113 (!) 109 (!) 117 (!) 107  Resp: 20 18 20 20   Temp: 98.3 F (36.8 C)  98.5 F (36.9 C) 98.4 F (36.9 C)  TempSrc: Oral  Oral Oral  SpO2: 95% 96% 97% 97%  Weight:      Height:       Weight change:   Physical Examination: General exam: AAox3, weak,older appearing HEENT:Oral mucosa moist, Ear/Nose WNL grossly, dentition normal. Respiratory system: bilaterally clear BS, no use of accessory muscle Cardiovascular system: S1 & S2 +, regular rate. Gastrointestinal system: Abdomen soft, NT,ND,BS+ Nervous System:Alert, awake, moving extremities and grossly nonfocal Extremities: LE ankle edema neg, lower extremities warm Skin: No rashes,no icterus. MSK: Normal muscle bulk,tone, power General exam: alert awake, oriented, older than stated age  Medications reviewed:  Scheduled Meds:  apixaban  10 mg Oral BID   Followed by   Melene Muller ON 10/19/2022] apixaban  5 mg Oral BID   atorvastatin  40 mg Oral Daily   folic acid  1 mg Oral Daily   guaiFENesin  600 mg Oral BID   magnesium oxide  400 mg Oral BID   metoprolol tartrate  12.5 mg Oral BID   multivitamin with minerals  1 tablet Oral Daily   potassium chloride  40 mEq Oral Once    thiamine  100 mg Oral Daily   Or   thiamine  100 mg Intravenous Daily  Continuous Infusions:  Diet Order             Diet Heart Room service appropriate? Yes; Fluid consistency: Thin  Diet effective now                  Intake/Output Summary (Last 24 hours) at 10/12/2022 1337 Last data filed at 10/12/2022 0900 Gross per 24 hour  Intake --  Output 1600 ml  Net -1600 ml   Net IO Since Admission: -4,300 mL [10/12/22 1337]  Wt Readings from Last 3 Encounters:  10/10/22 59.2 kg  10/03/22 60.8 kg  08/01/22 55.7 kg   Unresulted Labs (From admission, onward)     Start     Ordered   10/11/22 1320  Expectorated Sputum Assessment w Gram Stain, Rflx to Resp Cult  Once,   R        10/11/22 1319   10/11/22 0500  Basic metabolic panel  Daily,   R      10/10/22 1308   10/11/22 0500  CBC  Daily,   R      10/10/22 1308   10/10/22 0159  Legionella Pneumophila Serogp 1 Ur Ag  Once,   R        10/10/22 0159          Data Reviewed: I have personally reviewed following labs and imaging studies Recent Labs  Lab 10/09/22 1708 10/10/22 0410 10/11/22 0301 10/12/22 0329  WBC 17.0*  14.6* 12.0* 13.7*  HGB 9.1* 8.2* 8.3* 9.0*  HCT 27.2* 23.9* 23.0* 24.8*  MCV 83.4 81.0 77.7* 78.0*  PLT 409* 384 420* 491*   Basic Metabolic Panel: Recent Labs  Lab 10/09/22 1708 10/10/22 0410 10/11/22 0301 10/12/22 0329  NA 132* 135 134* 135  K 3.3* 3.0* 3.0* 3.7  CL 98 103 104 103  CO2 21* 23 21* 21*  GLUCOSE 107* 88 96 95  BUN 15 15 9  7*  CREATININE 0.82 0.84 0.75 0.76  CALCIUM 9.1 8.4* 8.3* 8.7*  MG  --  1.6* 1.6*  --    GFR: Estimated Creatinine Clearance: 64.6 mL/min (by C-G formula based on SCr of 0.76 mg/dL). Liver Function Tests: Recent Labs  Lab 10/09/22 1708  AST 30  ALT 9  ALKPHOS 67  BILITOT 0.7  PROT 7.7  ALBUMIN 2.7*   Recent Labs  Lab 10/09/22 1708  LIPASE 49   Recent Labs  Lab 10/09/22 1708 10/09/22 1914 10/12/22 0329  PROCALCITON  --   --  0.30   LATICACIDVEN 0.9 1.2  --    Recent Results (from the past 240 hour(s))  Blood culture (routine x 2)     Status: None (Preliminary result)   Collection Time: 10/09/22  5:09 PM   Specimen: BLOOD  Result Value Ref Range Status   Specimen Description BLOOD RIGHT ANTECUBITAL  Final   Special Requests   Final    BOTTLES DRAWN AEROBIC AND ANAEROBIC Blood Culture results may not be optimal due to an excessive volume of blood received in culture bottles   Culture   Final    NO GROWTH 3 DAYS Performed at [facility], [address], [place], [place] [address]    Report Status PENDING  Incomplete  Blood culture (routine x 2)     Status: None (Preliminary result)   Collection Time: 10/09/22  6:44 PM   Specimen: BLOOD LEFT HAND  Result Value Ref Range Status   Specimen Description BLOOD LEFT HAND  Final   Special Requests   Final    BOTTLES DRAWN AEROBIC AND ANAEROBIC Blood Culture adequate volume   Culture   Final    NO GROWTH 3 DAYS Performed at [facility], [address], [place], [place] [address]    Report Status PENDING  Incomplete  SARS Coronavirus 2 by RT PCR (hospital order, performed in [facility] hospital lab) *cepheid single result test*     Status: None   Collection Time: 10/10/22 11:05 AM  Result Value Ref Range Status   SARS Coronavirus 2 by RT PCR NEGATIVE NEGATIVE Final    Comment: Performed at [facility], [address], [place], [place] [address]  MRSA Next Gen by PCR, Nasal     Status: None   Collection Time: 10/11/22 10:02 AM   Specimen: Nasal Mucosa; Nasal Swab  Result Value Ref Range Status   MRSA by PCR Next Gen NOT DETECTED NOT DETECTED Final    Comment: (NOTE) The GeneXpert MRSA Assay (FDA approved for NASAL specimens only), is one component of a comprehensive MRSA colonization surveillance program. It is not intended to diagnose MRSA infection nor to guide or monitor treatment for MRSA infections. Test performance is not FDA approved  in patients less than 9 years old. Performed at [facility], [address], [place], [place] [address]     Antimicrobials: Anti-infectives (From admission, onward)    Start     Dose/Rate Route Frequency Ordered  Stop   10/10/22 1800  vancomycin (VANCOREADY) IVPB 1250 mg/250 mL  Status:  Discontinued        1,250 mg 166.7 mL/hr over 90 Minutes Intravenous Every 24 hours 10/09/22 2223 10/11/22 1601   10/10/22 1200  ceFEPIme (MAXIPIME) 2 g in sodium chloride 0.9 % 100 mL IVPB  Status:  Discontinued        2 g 200 mL/hr over 30 Minutes Intravenous Every 8 hours 10/10/22 0218 10/12/22 1257   10/10/22 0230  ceFEPIme (MAXIPIME) 2 g in sodium chloride 0.9 % 100 mL IVPB        2 g 200 mL/hr over 30 Minutes Intravenous  Once 10/10/22 0218 10/10/22 0423   10/09/22 1845  vancomycin (VANCOREADY) IVPB 1250 mg/250 mL        1,250 mg 166.7 mL/hr over 90 Minutes Intravenous  Once 10/09/22 1837 10/09/22 2210   10/09/22 1815  piperacillin-tazobactam (ZOSYN) IVPB 3.375 g        3.375 g 100 mL/hr over 30 Minutes Intravenous  Once 10/09/22 1811 10/09/22 1858      Culture/Microbiology    Component Value Date/Time   SDES BLOOD LEFT HAND 10/09/2022 1844   SPECREQUEST  10/09/2022 1844    BOTTLES DRAWN AEROBIC AND ANAEROBIC Blood Culture adequate volume   CULT  10/09/2022 1844    NO GROWTH 3 DAYS Performed at [facility], [address], [place], [place] [address]    REPTSTATUS PENDING 10/09/2022 1844  Radiology Studies: DG Chest 2 View  Result Date: 10/11/2022 CLINICAL DATA:  Pleural effusion. EXAM: CHEST - 2 VIEW COMPARISON:  Chest radiograph 07/20/2022.  Chest CT 10/09/2022. FINDINGS: Unchanged small left pleural effusion with adjacent left basilar atelectasis. Stable cardiac and mediastinal contours. No pneumothorax. IMPRESSION: Unchanged small left pleural effusion with adjacent left basilar atelectasis. Electronically Signed   By: Orvan Falconer M.D.   On: 10/11/2022 08:31   VAS  [place] LOWER EXTREMITY VENOUS (DVT)  Result Date: 10/10/2022  Lower Venous DVT Study Patient Name:  FEMI DALAL  Date of Exam:   10/10/2022 Medical Rec #: [id number]       Accession #:    [id number] Date of Birth: 11-05-1955       Patient Gender: F Patient Age:   75 years Exam Location:  [facility] Procedure:      VAS [place] LOWER EXTREMITY VENOUS (DVT) Referring Phys: Ulyess Blossom RATHORE --------------------------------------------------------------------------------  Indications: Pulmonary embolism.  Comparison Study: 08-01-2022 Prior bilateral lower extremity venous study was                   negative for DVT. Performing Technologist: Jean Rosenthal RDMS, RVT  Examination Guidelines: A complete evaluation includes B-mode imaging, spectral Doppler, color Doppler, and power Doppler as needed of all accessible portions of each vessel. Bilateral testing is considered an integral part of a complete examination. Limited examinations for reoccurring indications may be performed as noted. The reflux portion of the exam is performed with the patient in reverse Trendelenburg.  +---------+---------------+---------+-----------+----------+--------------+ RIGHT    CompressibilityPhasicitySpontaneityPropertiesThrombus Aging +---------+---------------+---------+-----------+----------+--------------+ CFV      Full           Yes      Yes                                 +---------+---------------+---------+-----------+----------+--------------+ SFJ      Full                                                        +---------+---------------+---------+-----------+----------+--------------+  FV Prox  Full                                                        +---------+---------------+---------+-----------+----------+--------------+ FV Mid   Full                                                        +---------+---------------+---------+-----------+----------+--------------+ FV DistalFull                                                         +---------+---------------+---------+-----------+----------+--------------+ PFV      Full                                                        +---------+---------------+---------+-----------+----------+--------------+ POP      Full           Yes      Yes                                 +---------+---------------+---------+-----------+----------+--------------+ PTV      Full                                                        +---------+---------------+---------+-----------+----------+--------------+ PERO     Full                                                        +---------+---------------+---------+-----------+----------+--------------+   +---------+---------------+---------+-----------+----------+--------------+ LEFT     CompressibilityPhasicitySpontaneityPropertiesThrombus Aging +---------+---------------+---------+-----------+----------+--------------+ CFV      Full           Yes      Yes                                 +---------+---------------+---------+-----------+----------+--------------+ SFJ      Full                                                        +---------+---------------+---------+-----------+----------+--------------+ FV Prox  Full                                                        +---------+---------------+---------+-----------+----------+--------------+  FV Mid   Full                                                        +---------+---------------+---------+-----------+----------+--------------+ FV DistalFull                                                        +---------+---------------+---------+-----------+----------+--------------+ PFV      Full                                                        +---------+---------------+---------+-----------+----------+--------------+ POP      Full           Yes      Yes                                  +---------+---------------+---------+-----------+----------+--------------+ PTV      Full                                                        +---------+---------------+---------+-----------+----------+--------------+ PERO     Full                                                        +---------+---------------+---------+-----------+----------+--------------+     Summary: RIGHT: - There is no evidence of deep vein thrombosis in the lower extremity.  - No cystic structure found in the popliteal fossa.  LEFT: - There is no evidence of deep vein thrombosis in the lower extremity.  - No cystic structure found in the popliteal fossa.  *See table(s) above for measurements and observations. Electronically signed by Coral Else MD on 10/10/2022 at 10:14:51 PM.    Final    VAS [place] UPPER EXTREMITY VENOUS DUPLEX  Result Date: 10/10/2022 UPPER VENOUS STUDY  Patient Name:  ELLAMARIE TRIANO  Date of Exam:   10/10/2022 Medical Rec #: [id number]       Accession #:    [id number] Date of Birth: [birth year]       Patient Gender: F Patient Age:   68 years Exam Location:  [facility] Procedure:      VAS [place] UPPER EXTREMITY VENOUS DUPLEX Referring Phys: Ulyess Blossom RATHORE --------------------------------------------------------------------------------  Indications: Right hand swelling, PE Comparison Study: No prior studies. Performing Technologist: Jean Rosenthal RDMS, RVT  Examination Guidelines: A complete evaluation includes B-mode imaging, spectral Doppler, color Doppler, and power Doppler as needed of all accessible portions of each vessel. Bilateral testing is considered an integral part of a complete examination. Limited examinations for reoccurring indications may be performed as noted.  Right Findings: +----------+------------+---------+-----------+----------+-------+ RIGHT  CompressiblePhasicitySpontaneousPropertiesSummary +----------+------------+---------+-----------+----------+-------+ IJV            Full       Yes       Yes                      +----------+------------+---------+-----------+----------+-------+ Subclavian    Full       Yes       Yes                      +----------+------------+---------+-----------+----------+-------+ Axillary      Full       Yes       Yes                      +----------+------------+---------+-----------+----------+-------+ Brachial      Full                                          +----------+------------+---------+-----------+----------+-------+ Radial        Full                                          +----------+------------+---------+-----------+----------+-------+ Ulnar         Full                                          +----------+------------+---------+-----------+----------+-------+ Cephalic      Full                                          +----------+------------+---------+-----------+----------+-------+ Basilic       Full                                          +----------+------------+---------+-----------+----------+-------+  Summary:  Right: No evidence of deep vein thrombosis in the upper extremity. No evidence of superficial vein thrombosis in the upper extremity.  *See table(s) above for measurements and observations.  Diagnosing physician: Coral Else MD Electronically signed by Coral Else MD on 10/10/2022 at 10:13:56 PM.    Final     LOS: 2 days   Lanae Boast, MD [facility] 10/12/2022, 1:37 PM

## 2022-10-12 NOTE — Progress Notes (Signed)
Pharmacy Antibiotic Note  Kristen Bautista is a 67 y.o. female admitted on 10/09/2022 with pneumonia.  Pharmacy has been consulted for cefepime dosing. Vancomycin stopped with neg MRSA PCR. Cr stable.  Plan: Cefepime 2g IV Q8H.  Height: 5\' 7"  (170.2 cm) Weight: 59.2 kg (130 lb 9.6 oz) IBW/kg (Calculated) : 61.6  Temp (24hrs), Avg:98.4 F (36.9 C), Min:98.3 F (36.8 C), Max:98.5 F (36.9 C)  Recent Labs  Lab 10/09/22 1708 10/09/22 1914 10/10/22 0410 10/11/22 0301 10/12/22 0329  WBC 17.0*  --  14.6* 12.0* 13.7*  CREATININE 0.82  --  0.84 0.75 0.76  LATICACIDVEN 0.9 1.2  --   --   --      Estimated Creatinine Clearance: 64.6 mL/min (by C-G formula based on SCr of 0.76 mg/dL).    No Known Allergies  Thank you for allowing pharmacy to be a part of this patient's care.  Fredonia Highland, PharmD, BCPS, [facility] Clinical Pharmacist [phone] Please check AMION for all [facility] Pharmacy numbers 10/12/2022

## 2022-10-12 NOTE — Progress Notes (Signed)
   10/12/22 1400  Assess: MEWS Score  Temp 98 F (36.7 C)  BP 125/82  MAP (mmHg) 94  ECG Heart Rate (!) 121  Resp (!) 22  Level of Consciousness Alert  SpO2 90 %  O2 Device Nasal Cannula  O2 Flow Rate (L/min) 2 L/min  Assess: MEWS Score  MEWS Temp 0  MEWS Systolic 0  MEWS Pulse 2  MEWS RR 1  MEWS LOC 0  MEWS Score 3  MEWS Score Color Yellow  Assess: if the MEWS score is Yellow or Red  Were vital signs taken at a resting state? Yes  Focused Assessment No change from prior assessment  Does the patient meet 2 or more of the SIRS criteria? No  MEWS guidelines implemented  Yes, yellow  Treat  MEWS Interventions Considered administering scheduled or prn medications/treatments as ordered  Take Vital Signs  Increase Vital Sign Frequency  Yellow: Q2hr x1, continue Q4hrs until patient remains green for 12hrs  Escalate  MEWS: Escalate Yellow: Discuss with charge nurse and consider notifying provider and/or RRT  Assess: SIRS CRITERIA  SIRS Temperature  0  SIRS Pulse 1  SIRS Respirations  1  SIRS WBC 1  SIRS Score Sum  3    Yellow MEWS - HR 120. Patient c/o left sided chest pain, unchanged from prior feeling. Dr. Jonathon Bellows notified - evaluated remotely.

## 2022-10-13 DIAGNOSIS — I2699 Other pulmonary embolism without acute cor pulmonale: Secondary | ICD-10-CM | POA: Diagnosis not present

## 2022-10-13 LAB — BASIC METABOLIC PANEL
Anion gap: 11 (ref 5–15)
BUN: 8 mg/dL (ref 8–23)
CO2: 23 mmol/L (ref 22–32)
Calcium: 8.8 mg/dL — ABNORMAL LOW (ref 8.9–10.3)
Chloride: 101 mmol/L (ref 98–111)
Creatinine, Ser: 0.74 mg/dL (ref 0.44–1.00)
GFR, Estimated: >60 mL/min (ref >60)
Glucose, Bld: 98 mg/dL (ref 70–99)
Potassium: 3.6 mmol/L (ref 3.5–5.1)
Sodium: 135 mmol/L (ref 135–145)

## 2022-10-13 LAB — CULTURE, BLOOD (ROUTINE X 2): Special Requests: ADEQUATE

## 2022-10-13 MED ORDER — GUAIFENESIN-DM 100-10 MG/5ML PO SYRP: 10.0000 mL | ORAL_SOLUTION | ORAL | 0 refills | Status: DC | PRN

## 2022-10-13 MED ORDER — APIXABAN 5 MG PO TABS: 5.0000 mg | ORAL_TABLET | Freq: Two times a day (BID) | ORAL | 0 refills | Status: AC

## 2022-10-13 MED ORDER — METOPROLOL TARTRATE 25 MG PO TABS
25.0000 mg | ORAL_TABLET | Freq: Two times a day (BID) | ORAL | Status: DC
  Administered 2022-10-13: 25 mg via ORAL
  Filled 2022-10-13: qty 1

## 2022-10-13 MED ORDER — APIXABAN 5 MG PO TABS: 10.0000 mg | ORAL_TABLET | Freq: Two times a day (BID) | ORAL | 0 refills | Status: DC

## 2022-10-13 MED ORDER — METOPROLOL TARTRATE 25 MG PO TABS: 25.0000 mg | ORAL_TABLET | Freq: Two times a day (BID) | ORAL | 0 refills | Status: AC

## 2022-10-13 NOTE — Evaluation (Signed)
Physical Therapy Evaluation Patient Details Name: Kristen Bautista MRN: [id number] DOB: 1955-12-05 Today's Date: 10/13/2022  History of Present Illness  Pt is a 67 y/o F presentign to ED with epigastric abdominal pain, increased work of breathing and BLE edema x2 weeks. CTA chest showing L lower lobe PE adn moderate L pleural effusion. Pt with R hand pain/edema, R wrist xray without acute osseous abnormaility. 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  Clinical Impression  Pt presents with admitting diagnosis above. Today, pt was able to ambulate in hallway with rollator and navigate stairs at mod I level. Pt presents at or near baseline mobility. Pt has no further acute PT needs and will be signing off. Re consult PT if mobility status changes.        Recommendations for follow up therapy are one component of a multi-disciplinary discharge planning process, led by the attending physician.  Recommendations may be updated based on patient status, additional functional criteria and insurance authorization.  Follow Up Recommendations       Assistance Recommended at Discharge PRN  Patient can return home with the following  Assist for transportation;Assistance with cooking/housework    Equipment Recommendations None recommended by PT (Pt has needed DME)  Recommendations for Other Services       Functional Status Assessment Patient has had a recent decline in their functional status and demonstrates the ability to make significant improvements in function in a reasonable and predictable amount of time.     Precautions / Restrictions Precautions Precautions: Cervical Required Braces or Orthoses: Other Brace Other Brace: pt reports she has a brace at home, only wears at night Restrictions Weight Bearing  Restrictions: No      Mobility  Bed Mobility Overal bed mobility: Modified Independent                  Transfers Overall transfer level: Modified independent Equipment used: Rollator (4 wheels)                    Ambulation/Gait Ambulation/Gait assistance: Modified independent (Device/Increase time) Gait Distance (Feet): 500 Feet Assistive device: Rollator (4 wheels) Gait Pattern/deviations: WFL(Within Functional Limits) Gait velocity: decreased     General Gait Details: no LOB noted.  Stairs Stairs: Yes Stairs assistance: Independent Stair Management: One rail Left, Alternating pattern, Forwards Number of Stairs: 3 General stair comments: no LOB noted  Wheelchair Mobility    Modified Rankin (Stroke Patients Only)       Balance Overall balance assessment: Mild deficits observed, not formally tested                                           Pertinent Vitals/Pain Pain Assessment Pain Assessment: No/denies pain    Home Living Family/patient expects to be discharged to:: Private residence Living Arrangements: Other relatives (Cousin) Available Help at Discharge: Family;Available PRN/intermittently Type of Home: House Home Access: Stairs to enter Entrance Stairs-Rails: Can reach both Entrance Stairs-Number of Steps: 2   Home Layout: One level Home Equipment: Rollator (4 wheels);BSC/3in1;Hand held shower head      Prior Function Prior Level of Function : Independent/Modified Independent;Working/employed;Driving             Mobility Comments: Mod I rollator ADLs Comments: Ind  Hand Dominance   Dominant Hand: Left    Extremity/Trunk Assessment   Upper Extremity Assessment Upper Extremity Assessment: RUE deficits/detail RUE Deficits / Details: pt reports was edematous, mild edema, decreased sensation in palm area, 2+/5 grasp, can perform digit opposition but unabel to reach 5th digit, can extend digits with  forarm pronated RUE Coordination: decreased fine motor    Lower Extremity Assessment Lower Extremity Assessment: Defer to PT evaluation    Cervical / Trunk Assessment Cervical / Trunk Assessment: Neck Surgery (07/2022)  Communication   Communication: No difficulties  Cognition Arousal/Alertness: Awake/alert Behavior During Therapy: WFL for tasks assessed/performed Overall Cognitive Status: Within Functional Limits for tasks assessed                                          General Comments General comments (skin integrity, edema, etc.): SpO2 between 94-96% on RA    Exercises     Assessment/Plan    PT Assessment Patient does not need any further PT services  PT Problem List         PT Treatment Interventions      PT Goals (Current goals can be found in the Care Plan section)       Frequency       Co-evaluation               AM-PAC PT "6 Clicks" Mobility  Outcome Measure Help needed turning from your back to your side while in a flat bed without using bedrails?: None Help needed moving from lying on your back to sitting on the side of a flat bed without using bedrails?: None Help needed moving to and from a bed to a chair (including a wheelchair)?: None Help needed standing up from a chair using your arms (e.g., wheelchair or bedside chair)?: None Help needed to walk in hospital room?: None Help needed climbing 3-5 steps with a railing? : None 6 Click Score: 24    End of Session   Activity Tolerance: Patient tolerated treatment well Patient left: in chair;with call bell/phone within reach Nurse Communication: Mobility status PT Visit Diagnosis: Other abnormalities of gait and mobility (R26.89)    Time: 5409-8119 PT Time Calculation (min) (ACUTE ONLY): 26 min   Charges:   PT Evaluation $PT Eval Low Complexity: 1 Low PT Treatments $Gait Training: 8-22 mins        Shela Nevin, PT, DPT Acute Rehab Services [phone]   Gladys Damme 10/13/2022, 12:45 PM

## 2022-10-13 NOTE — Progress Notes (Signed)
RNCM to room to provide Eliquis card-patient states she has Eliquis card.

## 2022-10-13 NOTE — Discharge Summary (Signed)
Physician Discharge Summary  Kristen Bautista ZOX:[id number] DOB: 1955-07-12 DOA: 10/09/2022  PCP: Ollen Bowl, MD  Admit date: 10/09/2022 Discharge date: 10/13/2022 Recommendations for Outpatient Follow-up:  Follow up with PCP in 1 weeks-call for appointment Please obtain BMP/CBC in one week  Discharge Dispo: home Discharge Condition: Stable Code Status:   Code Status: DNR Diet recommendation:  Diet Order             Diet Heart Room service appropriate? Yes; Fluid consistency: Thin  Diet effective now                    Brief/Interim Summary: 67 y.o. f w/ aortic atherosclerosis, coronary artery calcification, hepatitis C, hypertension, hyperlipidemia, tobacco abuse, alcohol abuse, cannabis abuse who was admitted to the hospital on 08/01/2022 for C3-C6 severe cervical stenosis with myelopathy concerning for epidural abscess,was treated with IV antibiotics and underwent open posterior cervical lateral mass instrumentation posterior lateral fusion C3-4 4-5 and C5-6 as well as laminotomies evacuation of epidural abscess 07/28/2022 and was discharged to inpatient rehab and stayed there until 3/14 from where discharged on oral antibiotics (Levaquin and doxycycline) for additional 30 days per ID recommendation. She now presented to the ED 10/11/22 w/ epigastric abdominal pain worse with breathing over the past 2 weeks and bilateral lower extremity edema, was tachycardic and tachypneic on arrival to the ED afebrile, not hypotensive. xygen saturation in the low 90s and placed on 2 L supplemental oxygen. Labs showing WBC 17.0, hemoglobin 9.1 (at baseline), platelet count 409k, sodium 132, potassium 3.3, chloride 98, HCO3-21, BUN 15, creatinine 0.8, CBG-107, albumin 2.7, normal lipase and LFTs,troponin negative, lactic acid normal x 2, BNP normal,UA not suggestive of infection.Blood cultures collected.  CTA chest showing left lower lobe PE, moderate left-sided pleural effusion, bibasilar consolidation  and volume loss.  CT abdomen pelvis showing cholelithiasis and no acute finding.  Right upper quadrant ultrasound showing cholelithiasis without complicating factors and changes suspicious for early cirrhosis.  Patient had pain and swelling of her right hand/forearm.  X-ray of right wrist showing mild degenerative changes without an acute osseous abnormality.  Patient was started on IV heparin and was given Dilaudid, Zofran, oral potassium 40 mEq, 1 L LR bolus, vancomycin, and Zosyn. Patient admitted and managed with IV heparin drip, repeat chest x-ray much better seen by pulmonary critical care> fPCCM felt most of LLL opacity infarcted lung with only trace of 1 cm effusion with insufficient pocket for safe thoracentesis, advised transition to DOAC at least 3 months course with reevaluation for duration of clinic and clinic will follow-up as outpatient. Patient has been weaned off to room air at rest> but needing oxygen with ambulation 5/3 also became tachycardic, meds adjusted to metoprolol.  Her procalcitonin was negative, per pulmonary her consolidation is likely from pulmonary infarct show antibiotic discontinued 5/3.  Transition to Eliquis 5/3. Noted to have cholelithiasis without complicating factors in RUQ [place] and  changes suspicious for early cirrhosis noted.  She is to follow-up with her PCP/PCCM. She did well today on walking no hypoxic HR better overall compared to yesterday and requesting for discharge home.  She ambulated well today w/ PTOT and no need for home health PT OT.  Discharge Diagnoses:  Principal Problem:   Acute pulmonary embolism (HCC) Active Problems:   HCAP (healthcare-associated pneumonia)   Pleural effusion   HTN (hypertension)   HLD (hyperlipidemia)   Alcohol abuse  Acute left lower lobe WJ:[id number] to Eliquis. Appreciate pulmonary input continue for  3 months will need follow-up with them re duration of treatment.Remains hemodynamically stable although mildly  tachycardic on ambulation, echo with preserved EF 55 to 60%, RV systolic function hyperdynamic size normal with normal pulmonary artery systolic pressure, G1 DD.  Upper extremity lower extremity duplex unremarkable.Will transition to DOAC soon.   Moderate left-sided pleural effusion Bibasilar consolidation volume loss: Likely in the setting of #1 with pulmonary infarct per pulmonary, procalcitonin is reassuring less likely infectious etiology iv antibiotic discontinued. Sepsis ruled out. Has mild leukocytosis likely reactive continue to monitor as outpatient   Asymptomatic cholelithiasis: LFTs unremarkable.  Ultrasound no other suspicious finding Possible early signs of cirrhosis on the ultrasound follow-up with PCP/ Hypertension: BP well controlled on amlodipine 5 mg Hyperlipidemia: Continue Lipitor Alcohol abuse history-watch for withdrawls.  Doing well over Chronic anemia-hemoglobin is stable, continue to need to  mild hyponatremia-stable Hypokalemia - replaced  Hypomagnesemia continue Mag-Ox Hypoalbuminemia:RD consulted. Lower leg edema likely from hypoalbuminemia, edema appears improved. Doppler is neg for DVT.  Consults: PCCM  Subjective: Alert awake and doing well. On RA and wants to go home today.  Discharge Exam: Vitals:   10/13/22 0808 10/13/22 1259  BP: 132/78 131/70  Pulse: 99 (!) 104  Resp: 18 18  Temp: 98.7 F (37.1 C) 97.8 F (36.6 C)  SpO2: 96% 97%   General: Pt is alert, awake, not in acute distress Cardiovascular: RRR, S1/S2 +, no rubs, no gallops Respiratory: CTA bilaterally, no wheezing, no rhonchi Abdominal: Soft, NT, ND, bowel sounds + Extremities: no edema, no cyanosis  Discharge Instructions  Discharge Instructions     Discharge instructions   Complete by: As directed    Please call call MD or return to ER for similar or worsening recurring problem that brought you to hospital or if any fever,nausea/vomiting,abdominal pain, uncontrolled pain,  chest pain,  shortness of breath or any other alarming symptoms.  Follow-up with pulmonary disease office regarding duration of anticoagulation  Please follow-up your doctor as instructed in a week time and call the office for appointment.  Please avoid alcohol, smoking, or any other illicit substance and maintain healthy habits including taking your regular medications as prescribed.  You were cared for by a hospitalist during your hospital stay. If you have any questions about your discharge medications or the care you received while you were in the hospital after you are discharged, you can call the unit and ask to speak with the hospitalist on call if the hospitalist that took care of you is not available.  Once you are discharged, your primary care physician will handle any further medical issues. Please note that NO REFILLS for any discharge medications will be authorized once you are discharged, as it is imperative that you return to your primary care physician (or establish a relationship with a primary care physician if you do not have one) for your aftercare needs so that they can reassess your need for medications and monitor your lab values   Increase activity slowly   Complete by: As directed       Allergies as of 10/13/2022   No Known Allergies      Medication List     STOP taking these medications    acetaminophen 325 MG tablet Commonly known as: TYLENOL   amLODipine 5 MG tablet Commonly known as: NORVASC   aspirin EC 81 MG tablet       TAKE these medications    apixaban 5 MG Tabs tablet Commonly known as: ELIQUIS Take  2 tablets (10 mg total) by mouth 2 (two) times daily for 5 days.   apixaban 5 MG Tabs tablet Commonly known as: ELIQUIS Take 1 tablet (5 mg total) by mouth 2 (two) times daily. Start taking on: Oct 19, 2022   atorvastatin 40 MG tablet Commonly known as: LIPITOR Take 1 tablet (40 mg total) by mouth daily.   Baclofen 5 MG Tabs Take 1  tablet (5 mg total) by mouth 3 (three) times daily.   gabapentin 300 MG capsule Commonly known as: NEURONTIN Take 1 capsule (300 mg total) by mouth 2 (two) times daily.   guaiFENesin-dextromethorphan 100-10 MG/5ML syrup Commonly known as: ROBITUSSIN DM Take 10 mLs by mouth every 4 (four) hours as needed for cough.   methocarbamol 500 MG tablet Commonly known as: ROBAXIN Take 1 tablet (500 mg total) by mouth every 6 (six) hours as needed for muscle spasms.   metoprolol tartrate 25 MG tablet Commonly known as: LOPRESSOR Take 1 tablet (25 mg total) by mouth 2 (two) times daily.   Oxycodone HCl 10 MG Tabs Take 1 tablet (10 mg total) by mouth every 3 (three) hours as needed for severe pain ((score 7 to 10)).        Follow-up Information     Pahwani, Rinka R, MD Follow up in 1 week(s).   Specialty: Internal Medicine Contact information: [address] [place] [address] [phone]         Omar Person, MD Follow up in 1 week(s).   Specialty: Pulmonary Disease Contact information: [address] [place] [address] [phone]                No Known Allergies  The results of significant diagnostics from this hospitalization (including imaging, microbiology, ancillary and laboratory) are listed below for reference.    Microbiology: Recent Results (from the past 240 hour(s))  Blood culture (routine x 2)     Status: None (Preliminary result)   Collection Time: 10/09/22  5:09 PM   Specimen: BLOOD  Result Value Ref Range Status   Specimen Description BLOOD RIGHT ANTECUBITAL  Final   Special Requests   Final    BOTTLES DRAWN AEROBIC AND ANAEROBIC Blood Culture results may not be optimal due to an excessive volume of blood received in culture bottles   Culture   Final    NO GROWTH 4 DAYS Performed at [facility], [address] [place], [place] [address]    Report Status PENDING  Incomplete  Blood culture (routine x 2)      Status: None (Preliminary result)   Collection Time: 10/09/22  6:44 PM   Specimen: BLOOD LEFT HAND  Result Value Ref Range Status   Specimen Description BLOOD LEFT HAND  Final   Special Requests   Final    BOTTLES DRAWN AEROBIC AND ANAEROBIC Blood Culture adequate volume   Culture   Final    NO GROWTH 4 DAYS Performed at [facility], [address] [place], [place] [address]    Report Status PENDING  Incomplete  SARS Coronavirus 2 by RT PCR (hospital order, performed in [facility] hospital lab) *cepheid single result test*     Status: None   Collection Time: 10/10/22 11:05 AM  Result Value Ref Range Status   SARS Coronavirus 2 by RT PCR NEGATIVE NEGATIVE Final    Comment: Performed at [facility], [address] [place], [place] [address]  MRSA Next Gen  by PCR, Nasal     Status: None   Collection Time: 10/11/22 10:02 AM   Specimen: Nasal Mucosa; Nasal Swab  Result Value Ref Range Status   MRSA by PCR Next Gen NOT DETECTED NOT DETECTED Final    Comment: (NOTE) The GeneXpert MRSA Assay (FDA approved for NASAL specimens only), is one component of a comprehensive MRSA colonization surveillance program. It is not intended to diagnose MRSA infection nor to guide or monitor treatment for MRSA infections. Test performance is not FDA approved in patients less than 58 years old. Performed at [facility], [address] [place], [place] [address]     Procedures/Studies: DG Chest 2 View  Result Date: 10/11/2022 CLINICAL DATA:  Pleural effusion. EXAM: CHEST - 2 VIEW COMPARISON:  Chest radiograph 07/20/2022.  Chest CT 10/09/2022. FINDINGS: Unchanged small left pleural effusion with adjacent left basilar atelectasis. Stable cardiac and mediastinal contours. No pneumothorax. IMPRESSION: Unchanged small left pleural effusion with adjacent left basilar atelectasis. Electronically Signed   By: Orvan Falconer M.D.   On: 10/11/2022 08:31   VAS [place] LOWER EXTREMITY VENOUS  (DVT)  Result Date: 10/10/2022  Lower Venous DVT Study Patient Name:  LEXIANN ZOPFI  Date of Exam:   10/10/2022 Medical Rec #: [id number]       Accession #:    [id number] Date of Birth: [birth year]       Patient Gender: F Patient Age:   39 years Exam Location:  [facility] Procedure:      VAS [place] LOWER EXTREMITY VENOUS (DVT) Referring Phys: Ulyess Blossom RATHORE --------------------------------------------------------------------------------  Indications: Pulmonary embolism.  Comparison Study: 08-01-2022 Prior bilateral lower extremity venous study was                   negative for DVT. Performing Technologist: Jean Rosenthal RDMS, RVT  Examination Guidelines: A complete evaluation includes B-mode imaging, spectral Doppler, color Doppler, and power Doppler as needed of all accessible portions of each vessel. Bilateral testing is considered an integral part of a complete examination. Limited examinations for reoccurring indications may be performed as noted. The reflux portion of the exam is performed with the patient in reverse Trendelenburg.  +---------+---------------+---------+-----------+----------+--------------+ RIGHT    CompressibilityPhasicitySpontaneityPropertiesThrombus Aging +---------+---------------+---------+-----------+----------+--------------+ CFV      Full           Yes      Yes                                 +---------+---------------+---------+-----------+----------+--------------+ SFJ      Full                                                        +---------+---------------+---------+-----------+----------+--------------+ FV Prox  Full                                                        +---------+---------------+---------+-----------+----------+--------------+ FV Mid   Full                                                        +---------+---------------+---------+-----------+----------+--------------+  FV DistalFull                                                         +---------+---------------+---------+-----------+----------+--------------+ PFV      Full                                                        +---------+---------------+---------+-----------+----------+--------------+ POP      Full           Yes      Yes                                 +---------+---------------+---------+-----------+----------+--------------+ PTV      Full                                                        +---------+---------------+---------+-----------+----------+--------------+ PERO     Full                                                        +---------+---------------+---------+-----------+----------+--------------+   +---------+---------------+---------+-----------+----------+--------------+ LEFT     CompressibilityPhasicitySpontaneityPropertiesThrombus Aging +---------+---------------+---------+-----------+----------+--------------+ CFV      Full           Yes      Yes                                 +---------+---------------+---------+-----------+----------+--------------+ SFJ      Full                                                        +---------+---------------+---------+-----------+----------+--------------+ FV Prox  Full                                                        +---------+---------------+---------+-----------+----------+--------------+ FV Mid   Full                                                        +---------+---------------+---------+-----------+----------+--------------+ FV DistalFull                                                        +---------+---------------+---------+-----------+----------+--------------+  PFV      Full                                                        +---------+---------------+---------+-----------+----------+--------------+ POP      Full           Yes      Yes                                  +---------+---------------+---------+-----------+----------+--------------+ PTV      Full                                                        +---------+---------------+---------+-----------+----------+--------------+ PERO     Full                                                        +---------+---------------+---------+-----------+----------+--------------+     Summary: RIGHT: - There is no evidence of deep vein thrombosis in the lower extremity.  - No cystic structure found in the popliteal fossa.  LEFT: - There is no evidence of deep vein thrombosis in the lower extremity.  - No cystic structure found in the popliteal fossa.  *See table(s) above for measurements and observations. Electronically signed by Coral Else MD on 10/10/2022 at 10:14:51 PM.    Final    VAS [place] UPPER EXTREMITY VENOUS DUPLEX  Result Date: 10/10/2022 UPPER VENOUS STUDY  Patient Name:  BETHANNE LUNDSTEDT  Date of Exam:   10/10/2022 Medical Rec #: [id number]       Accession #:    [id number] Date of Birth: 1956-01-25       Patient Gender: F Patient Age:   32 years Exam Location:  [facility] Procedure:      VAS [place] UPPER EXTREMITY VENOUS DUPLEX Referring Phys: Ulyess Blossom RATHORE --------------------------------------------------------------------------------  Indications: Right hand swelling, PE Comparison Study: No prior studies. Performing Technologist: Jean Rosenthal RDMS, RVT  Examination Guidelines: A complete evaluation includes B-mode imaging, spectral Doppler, color Doppler, and power Doppler as needed of all accessible portions of each vessel. Bilateral testing is considered an integral part of a complete examination. Limited examinations for reoccurring indications may be performed as noted.  Right Findings: +----------+------------+---------+-----------+----------+-------+ RIGHT     CompressiblePhasicitySpontaneousPropertiesSummary +----------+------------+---------+-----------+----------+-------+ IJV            Full       Yes       Yes                      +----------+------------+---------+-----------+----------+-------+ Subclavian    Full       Yes       Yes                      +----------+------------+---------+-----------+----------+-------+ Axillary      Full       Yes       Yes                      +----------+------------+---------+-----------+----------+-------+  Brachial      Full                                          +----------+------------+---------+-----------+----------+-------+ Radial        Full                                          +----------+------------+---------+-----------+----------+-------+ Ulnar         Full                                          +----------+------------+---------+-----------+----------+-------+ Cephalic      Full                                          +----------+------------+---------+-----------+----------+-------+ Basilic       Full                                          +----------+------------+---------+-----------+----------+-------+  Summary:  Right: No evidence of deep vein thrombosis in the upper extremity. No evidence of superficial vein thrombosis in the upper extremity.  *See table(s) above for measurements and observations.  Diagnosing physician: Coral Else MD Electronically signed by Coral Else MD on 10/10/2022 at 10:13:56 PM.    Final    ECHOCARDIOGRAM COMPLETE  Result Date: 10/10/2022    ECHOCARDIOGRAM REPORT   Patient Name:   LAAIBAH HOVELL Date of Exam: 10/10/2022 Medical Rec #:  [id number]      Height:       67.0 in Accession #:    [id number]     Weight:       130.6 lb Date of Birth:  17-Aug-1955      BSA:          1.687 m Patient Age:    67 years       BP:           140/63 mmHg Patient Gender: F              HR:           93 bpm. Exam Location:  Inpatient Procedure: 2D Echo, Cardiac Doppler and Color Doppler Indications:    I26.02 Pulmonary embolus  History:        Patient has no prior  history of Echocardiogram examinations.                 Risk Factors:Hypertension and Dyslipidemia.  Sonographer:    Mike Gip Referring Phys: Niasia Lanphear IMPRESSIONS  1. Left ventricular ejection fraction, by estimation, is 55 to 60%. The left ventricle has normal function. The left ventricle has no regional wall motion abnormalities. Left ventricular diastolic parameters are consistent with Grade I diastolic dysfunction (impaired relaxation).  2. Right ventricular systolic function is hyperdynamic. The right ventricular size is normal. There is normal pulmonary artery systolic pressure.  3. The mitral valve is normal in structure. Mild to moderate mitral valve regurgitation. No evidence of mitral stenosis.  4. The aortic  valve is tricuspid. Aortic valve regurgitation is not visualized. No aortic stenosis is present.  5. The inferior vena cava is normal in size with greater than 50% respiratory variability, suggesting right atrial pressure of 3 mmHg. Comparison(s): No prior Echocardiogram. FINDINGS  Left Ventricle: Left ventricular ejection fraction, by estimation, is 55 to 60%. The left ventricle has normal function. The left ventricle has no regional wall motion abnormalities. The left ventricular internal cavity size was normal in size. There is  no left ventricular hypertrophy. Left ventricular diastolic parameters are consistent with Grade I diastolic dysfunction (impaired relaxation). Right Ventricle: The right ventricular size is normal. No increase in right ventricular wall thickness. Right ventricular systolic function is hyperdynamic. There is normal pulmonary artery systolic pressure. The tricuspid regurgitant velocity is 2.21 m/s, and with an assumed right atrial pressure of 3 mmHg, the estimated right ventricular systolic pressure is 22.5 mmHg. Left Atrium: Left atrial size was normal in size. Right Atrium: Right atrial size was normal in size. Pericardium: There is no evidence of pericardial  effusion. Mitral Valve: The mitral valve is normal in structure. Mild to moderate mitral valve regurgitation. No evidence of mitral valve stenosis. Tricuspid Valve: The tricuspid valve is normal in structure. Tricuspid valve regurgitation is trivial. No evidence of tricuspid stenosis. Aortic Valve: The aortic valve is tricuspid. Aortic valve regurgitation is not visualized. No aortic stenosis is present. Pulmonic Valve: The pulmonic valve was not well visualized. Pulmonic valve regurgitation is not visualized. No evidence of pulmonic stenosis. Aorta: The aortic root is normal in size and structure and the ascending aorta was not well visualized. Venous: The inferior vena cava is normal in size with greater than 50% respiratory variability, suggesting right atrial pressure of 3 mmHg. IAS/Shunts: No atrial level shunt detected by color flow Doppler.  LEFT VENTRICLE PLAX 2D LVIDd:         4.80 cm      Diastology LVIDs:         3.70 cm      LV e' medial:    10.90 cm/s LV PW:         0.90 cm      LV E/e' medial:  9.5 LV IVS:        0.80 cm      LV e' lateral:   13.60 cm/s LVOT diam:     2.00 cm      LV E/e' lateral: 7.6 LV SV:         60 LV SV Index:   36 LVOT Area:     3.14 cm  LV Volumes (MOD) LV vol d, MOD A2C: 103.0 ml LV vol d, MOD A4C: 95.4 ml LV vol s, MOD A2C: 48.7 ml LV vol s, MOD A4C: 48.6 ml LV SV MOD A2C:     54.3 ml LV SV MOD A4C:     95.4 ml LV SV MOD BP:      49.7 ml RIGHT VENTRICLE             IVC RV Basal diam:  3.50 cm     IVC diam: 1.90 cm RV S prime:     16.20 cm/s TAPSE (M-mode): 2.1 cm LEFT ATRIUM             Index        RIGHT ATRIUM           Index LA diam:        2.90 cm 1.72 cm/m   RA Area:  13.20 cm LA Vol (A2C):   52.7 ml 31.23 ml/m  RA Volume:   36.20 ml  21.46 ml/m LA Vol (A4C):   38.3 ml 22.70 ml/m LA Biplane Vol: 46.1 ml 27.32 ml/m  AORTIC VALVE LVOT Vmax:   104.00 cm/s LVOT Vmean:  65.000 cm/s LVOT VTI:    0.192 m  AORTA Ao Root diam: 2.70 cm Ao Asc diam:  2.80 cm MITRAL VALVE                 TRICUSPID VALVE MV Area (PHT): 4.74 cm     TR Peak grad:   19.5 mmHg MV Decel Time: 160 msec     TR Vmax:        221.00 cm/s MV E velocity: 104.00 cm/s MV A velocity: 114.00 cm/s  SHUNTS MV E/A ratio:  0.91         Systemic VTI:  0.19 m                             Systemic Diam: 2.00 cm Riley Lam MD Electronically signed by Riley Lam MD Signature Date/Time: 10/10/2022/11:47:56 AM    Final    US Abdomen Limited RUQ (LIVER/GB)  Result Date: 10/09/2022 CLINICAL DATA:  Right upper quadrant pain EXAM: ULTRASOUND ABDOMEN LIMITED RIGHT UPPER QUADRANT COMPARISON:  CT from earlier in the same day. FINDINGS: Gallbladder: Gallbladder is well distended. Multiple gallstones are noted within. No wall thickening or pericholecystic fluid is noted. Negative sonographic Murphy's sign is elicited. Common bile duct: Diameter: 3.3 mm. Liver: Mild nodularity is noted similar to that seen on the prior CT examination. No focal mass is noted. Portal vein is patent on color Doppler imaging with normal direction of blood flow towards the liver. Other: None. IMPRESSION: Cholelithiasis without complicating factors. Changes suspicious for early cirrhosis. Electronically Signed   By: Alcide Clever M.D.   On: 10/09/2022 21:50   CT Angio Chest PE W and/or Wo Contrast  Result Date: 10/09/2022 CLINICAL DATA:  Pleurisy, abdominal pain, vomiting. EXAM: CT ANGIOGRAPHY CHEST CT ABDOMEN AND PELVIS WITH CONTRAST TECHNIQUE: Multidetector CT imaging of the chest was performed using the standard protocol during bolus administration of intravenous contrast. Multiplanar CT image reconstructions and MIPs were obtained to evaluate the vascular anatomy. Multidetector CT imaging of the abdomen and pelvis was performed using the standard protocol during bolus administration of intravenous contrast. RADIATION DOSE REDUCTION: This exam was performed according to the departmental dose-optimization program which includes  automated exposure control, adjustment of the mA and/or kV according to patient size and/or use of iterative reconstruction technique. CONTRAST:  75mL OMNIPAQUE IOHEXOL 350 MG/ML SOLN COMPARISON:  None Available. FINDINGS: CTA CHEST FINDINGS Cardiovascular: Mild cardiomegaly. No pericardial effusion. No aortic aneurysm or dissection. Atheromatous calcifications of the aorta and coronary arteries. No RV enlargement or reflux of contrast to the IVC to suggest insufficiency of the tricuspid or right heart strain. Filling defects identified in left lower lobe branch pulmonary arteries consistent with PE. Mediastinum/Nodes: No enlarged mediastinal, hilar, or axillary lymph nodes. Thyroid gland, trachea, and esophagus demonstrate no significant findings. Lungs/Pleura: Moderate left-sided pleural effusion. Bibasilar subsegmental atelectasis. No pneumothorax. Consolidation bilateral lower lobes. Musculoskeletal: No chest wall abnormality. No acute or significant osseous findings. Review of the MIP images confirms the above findings. CT ABDOMEN and PELVIS FINDINGS Hepatobiliary: Calcified gallstone. No hepatic parenchymal lesions or biliary ductal dilatation. Gallbladder demonstrates a somewhat nodular configuration suggestive of cirrhosis, and clinical correlation  recommended. Pancreas: Unremarkable. No pancreatic ductal dilatation or surrounding inflammatory changes. Spleen: Normal in size without focal abnormality. Adrenals/Urinary Tract: Adrenal glands are unremarkable. Kidneys are normal, without renal calculi, focal lesion, or hydronephrosis. There are bilateral extrarenal pelves. Bladder is unremarkable. Stomach/Bowel: Stomach is within normal limits. Appendix appears normal. No evidence of bowel wall thickening, distention, or inflammatory changes. Vascular/Lymphatic: Aortic atherosclerosis. No enlarged abdominal or pelvic lymph nodes. Reproductive: Uterus and bilateral adnexa are unremarkable. Other: No abdominal  wall hernia or abnormality. No abdominopelvic ascites. Musculoskeletal: No acute or significant osseous findings. L4-5 degenerative disc disease with vacuum disc changes. Review of the MIP images confirms the above findings. IMPRESSION: 1. Left lower lobe pulmonary emboli. 2. Left-sided pleural effusion. 3. Bibasilar consolidation and volume loss. 4. Cholelithiasis. Findings were discussed with and acknowledged by Dr. Elpidio Anis at 8:50 p.m. 10/09/2022. Electronically Signed   By: Layla Maw M.D.   On: 10/09/2022 20:55   CT ABDOMEN PELVIS W CONTRAST  Result Date: 10/09/2022 CLINICAL DATA:  Pleurisy, abdominal pain, vomiting. EXAM: CT ANGIOGRAPHY CHEST CT ABDOMEN AND PELVIS WITH CONTRAST TECHNIQUE: Multidetector CT imaging of the chest was performed using the standard protocol during bolus administration of intravenous contrast. Multiplanar CT image reconstructions and MIPs were obtained to evaluate the vascular anatomy. Multidetector CT imaging of the abdomen and pelvis was performed using the standard protocol during bolus administration of intravenous contrast. RADIATION DOSE REDUCTION: This exam was performed according to the departmental dose-optimization program which includes automated exposure control, adjustment of the mA and/or kV according to patient size and/or use of iterative reconstruction technique. CONTRAST:  75mL OMNIPAQUE IOHEXOL 350 MG/ML SOLN COMPARISON:  None Available. FINDINGS: CTA CHEST FINDINGS Cardiovascular: Mild cardiomegaly. No pericardial effusion. No aortic aneurysm or dissection. Atheromatous calcifications of the aorta and coronary arteries. No RV enlargement or reflux of contrast to the IVC to suggest insufficiency of the tricuspid or right heart strain. Filling defects identified in left lower lobe branch pulmonary arteries consistent with PE. Mediastinum/Nodes: No enlarged mediastinal, hilar, or axillary lymph nodes. Thyroid gland, trachea, and esophagus demonstrate no  significant findings. Lungs/Pleura: Moderate left-sided pleural effusion. Bibasilar subsegmental atelectasis. No pneumothorax. Consolidation bilateral lower lobes. Musculoskeletal: No chest wall abnormality. No acute or significant osseous findings. Review of the MIP images confirms the above findings. CT ABDOMEN and PELVIS FINDINGS Hepatobiliary: Calcified gallstone. No hepatic parenchymal lesions or biliary ductal dilatation. Gallbladder demonstrates a somewhat nodular configuration suggestive of cirrhosis, and clinical correlation recommended. Pancreas: Unremarkable. No pancreatic ductal dilatation or surrounding inflammatory changes. Spleen: Normal in size without focal abnormality. Adrenals/Urinary Tract: Adrenal glands are unremarkable. Kidneys are normal, without renal calculi, focal lesion, or hydronephrosis. There are bilateral extrarenal pelves. Bladder is unremarkable. Stomach/Bowel: Stomach is within normal limits. Appendix appears normal. No evidence of bowel wall thickening, distention, or inflammatory changes. Vascular/Lymphatic: Aortic atherosclerosis. No enlarged abdominal or pelvic lymph nodes. Reproductive: Uterus and bilateral adnexa are unremarkable. Other: No abdominal wall hernia or abnormality. No abdominopelvic ascites. Musculoskeletal: No acute or significant osseous findings. L4-5 degenerative disc disease with vacuum disc changes. Review of the MIP images confirms the above findings. IMPRESSION: 1. Left lower lobe pulmonary emboli. 2. Left-sided pleural effusion. 3. Bibasilar consolidation and volume loss. 4. Cholelithiasis. Findings were discussed with and acknowledged by Dr. Elpidio Anis at 8:50 p.m. 10/09/2022. Electronically Signed   By: Layla Maw M.D.   On: 10/09/2022 20:55   DG Wrist Complete Right  Result Date: 10/09/2022 CLINICAL DATA:  Right wrist pain and swelling. EXAM: RIGHT  WRIST - COMPLETE 3+ VIEW COMPARISON:  March 18, 2021 FINDINGS: There is no evidence of an acute  fracture or dislocation. Mild degenerative changes are seen involving the distal aspect of the right wrist. Mild, stable degenerative changes are also seen along the ulnocarpal joint. Mild dorsal soft tissue swelling is noted. IMPRESSION: Mild degenerative changes without an acute osseous abnormality. Electronically Signed   By: Aram Candela M.D.   On: 10/09/2022 18:44   [place] ASCITES (ABDOMEN LIMITED)  Result Date: 09/26/2022 CLINICAL DATA:  Assess for ascites. EXAM: ULTRASOUND ABDOMEN LIMITED COMPARISON:  None Available. FINDINGS: No ascites is identified. IMPRESSION: No ascites is identified. Electronically Signed   By: Sherian Rein M.D.   On: 09/26/2022 08:50   US Abdomen Complete  Result Date: 09/24/2022 CLINICAL DATA:  Follow-up cirrhosis EXAM: ABDOMEN ULTRASOUND COMPLETE COMPARISON:  Abdominal ultrasound 09/15/2015 FINDINGS: Gallbladder: There is a stone at the neck measuring 0.9 cm. No wall thickening. No sonographic Murphy sign noted by sonographer. Common bile duct: Diameter: 0.2 cm, within normal limits Liver: No focal lesion identified. Coarsened echotexture with contour nodularity. Portal vein is patent on color Doppler imaging with normal direction of blood flow towards the liver. IVC: No abnormality visualized. Pancreas: Tail obscured due to shadowing bowel gas visualized portion unremarkable. Spleen: Size and appearance within normal limits. Right Kidney: Length: 10.8 cm. Echogenicity increased. No mass or hydronephrosis visualized. Left Kidney: Length: 10.3 cm. Echogenicity increased. No mass or hydronephrosis visualized. Abdominal aorta: No aneurysm visualized. Other findings: None. IMPRESSION: 1. Cirrhotic liver morphology. No focal lesion identified. 2. Cholelithiasis without evidence of acute cholecystitis. 3. Increased echogenicity of the bilateral kidneys which is nonspecific but can be seen in the setting of medical renal disease. Electronically Signed   By: Emmaline Kluver M.D.    On: 09/24/2022 13:17    Labs: BNP (last 3 results) Recent Labs    10/09/22 1709  BNP 46.7   Basic Metabolic Panel: Recent Labs  Lab 10/09/22 1708 10/10/22 0410 10/11/22 0301 10/12/22 0329 10/13/22 0201  NA 132* 135 134* 135 135  K 3.3* 3.0* 3.0* 3.7 3.6  CL 98 103 104 103 101  CO2 21* 23 21* 21* 23  GLUCOSE 107* 88 96 95 98  BUN 15 15 9  7* 8  CREATININE 0.82 0.84 0.75 0.76 0.74  CALCIUM 9.1 8.4* 8.3* 8.7* 8.8*  MG  --  1.6* 1.6*  --   --    Liver Function Tests: Recent Labs  Lab 10/09/22 1708  AST 30  ALT 9  ALKPHOS 67  BILITOT 0.7  PROT 7.7  ALBUMIN 2.7*   Recent Labs  Lab 10/09/22 1708  LIPASE 49   No results for input(s): "AMMONIA" in the last 168 hours. CBC: Recent Labs  Lab 10/09/22 1708 10/10/22 0410 10/11/22 0301 10/12/22 0329  WBC 17.0* 14.6* 12.0* 13.7*  HGB 9.1* 8.2* 8.3* 9.0*  HCT 27.2* 23.9* 23.0* 24.8*  MCV 83.4 81.0 77.7* 78.0*  PLT 409* 384 420* 491*   Cardiac Enzymes: No results for input(s): "CKTOTAL", "CKMB", "CKMBINDEX", "TROPONINI" in the last 168 hours. BNP: Invalid input(s): "POCBNP" CBG: No results for input(s): "GLUCAP" in the last 168 hours. D-Dimer No results for input(s): "DDIMER" in the last 72 hours. Hgb A1c No results for input(s): "HGBA1C" in the last 72 hours. Lipid Profile No results for input(s): "CHOL", "HDL", "LDLCALC", "TRIG", "CHOLHDL", "LDLDIRECT" in the last 72 hours. Thyroid function studies No results for input(s): "TSH", "T4TOTAL", "T3FREE", "THYROIDAB" in the last 72 hours.  Invalid input(s): "FREET3" Anemia work up No results for input(s): "VITAMINB12", "FOLATE", "FERRITIN", "TIBC", "IRON", "RETICCTPCT" in the last 72 hours. Urinalysis    Component Value Date/Time   COLORURINE YELLOW 10/09/2022 1828   APPEARANCEUR CLEAR 10/09/2022 1828   LABSPEC 1.009 10/09/2022 1828   PHURINE 5.0 10/09/2022 1828   GLUCOSEU NEGATIVE 10/09/2022 1828   HGBUR SMALL (A) 10/09/2022 1828   BILIRUBINUR NEGATIVE  10/09/2022 1828   KETONESUR NEGATIVE 10/09/2022 1828   PROTEINUR 30 (A) 10/09/2022 1828   UROBILINOGEN 1.0 08/31/2013 1415   NITRITE NEGATIVE 10/09/2022 1828   LEUKOCYTESUR NEGATIVE 10/09/2022 1828   Sepsis Labs Recent Labs  Lab 10/09/22 1708 10/10/22 0410 10/11/22 0301 10/12/22 0329  WBC 17.0* 14.6* 12.0* 13.7*   Microbiology Recent Results (from the past 240 hour(s))  Blood culture (routine x 2)     Status: None (Preliminary result)   Collection Time: 10/09/22  5:09 PM   Specimen: BLOOD  Result Value Ref Range Status   Specimen Description BLOOD RIGHT ANTECUBITAL  Final   Special Requests   Final    BOTTLES DRAWN AEROBIC AND ANAEROBIC Blood Culture results may not be optimal due to an excessive volume of blood received in culture bottles   Culture   Final    NO GROWTH 4 DAYS Performed at [facility], [address] [place], [place] [address]    Report Status PENDING  Incomplete  Blood culture (routine x 2)     Status: None (Preliminary result)   Collection Time: 10/09/22  6:44 PM   Specimen: BLOOD LEFT HAND  Result Value Ref Range Status   Specimen Description BLOOD LEFT HAND  Final   Special Requests   Final    BOTTLES DRAWN AEROBIC AND ANAEROBIC Blood Culture adequate volume   Culture   Final    NO GROWTH 4 DAYS Performed at [facility], [address], [place], [place] [address]    Report Status PENDING  Incomplete  SARS Coronavirus 2 by RT PCR (hospital order, performed in [facility] hospital lab) *cepheid single result test*     Status: None   Collection Time: 10/10/22 11:05 AM  Result Value Ref Range Status   SARS Coronavirus 2 by RT PCR NEGATIVE NEGATIVE Final    Comment: Performed at [facility], [address], [place], [place] [address]  MRSA Next Gen by PCR, Nasal     Status: None   Collection Time: 10/11/22 10:02 AM   Specimen: Nasal Mucosa; Nasal Swab  Result Value Ref Range Status   MRSA by PCR Next Gen NOT DETECTED NOT  DETECTED Final    Comment: (NOTE) The GeneXpert MRSA Assay (FDA approved for NASAL specimens only), is one component of a comprehensive MRSA colonization surveillance program. It is not intended to diagnose MRSA infection nor to guide or monitor treatment for MRSA infections. Test performance is not FDA approved in patients less than 32 years old. Performed at [facility], [address], [place], [place] [address]      Time coordinating discharge: 25 minutes  SIGNED: Lanae Boast, MD  [facility] 10/13/2022, 1:41 PM  If 7PM-7AM, please contact night-coverage [email or web address]

## 2022-10-13 NOTE — Evaluation (Signed)
Occupational Therapy Evaluation Patient Details Name: Kristen Bautista MRN: [id number] DOB: 06-25-1955 Today's Date: 10/13/2022   History of Present Illness Pt is a 67 y/o F presentign to ED with epigastric abdominal pain, increased work of breathing and BLE edema x2 weeks. CTA chest showing L lower lobe PE and moderate L pleural effusion. Pt with R hand pain/edema, R wrist xray without acute osseous abnormaility. 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   Clinical Impression   Pt reports independence at baseline and uses rollator for mobility, lives with family who can assist at d/c. Pt currently with mild RUE edema and impaired fine motor coordination/sensation, needing set up -min A for ADLs, mod I for bed mobility and hallway ambulation with rollator. Pt  SpO2 in mid 90's throughout session on RA, HR up to 135bpm however pt asymptomatic. Pt reports she has a cervical collar but "only wears it at night", encouraged pt to use squeezeball/wash cloth to improve R hand strengthening, pt verbalized understanding. Pt presenting with impairments listed below, will follow acutely. Recommend OP OT at d/c.     Recommendations for follow up therapy are one component of a multi-disciplinary discharge planning process, led by the attending physician.  Recommendations may be updated based on patient status, additional functional criteria and insurance authorization.   Assistance Recommended at Discharge Set up Supervision/Assistance  Patient can return home with the following A little help with walking and/or transfers;A little help with bathing/dressing/bathroom;Assistance with cooking/housework;Direct supervision/assist for medications management;Direct supervision/assist for financial management;Assist for transportation;Help with  stairs or ramp for entrance    Functional Status Assessment  Patient has had a recent decline in their functional status and demonstrates the ability to make significant improvements in function in a reasonable and predictable amount of time.  Equipment Recommendations  Tub/shower seat    Recommendations for Other Services PT consult     Precautions / Restrictions Precautions Precautions: Cervical Required Braces or Orthoses: Other Brace Other Brace: pt reports she has a brace at home, only wears at night Restrictions Weight Bearing Restrictions: No      Mobility Bed Mobility Overal bed mobility: Modified Independent                  Transfers Overall transfer level: Modified independent Equipment used: Rollator (4 wheels)                      Balance Overall balance assessment: Mild deficits observed, not formally tested                                         ADL either performed or assessed with clinical judgement   ADL Overall ADL's : Needs assistance/impaired Eating/Feeding: Set up;Sitting   Grooming: Set up;Sitting   Upper Body Bathing: Minimal assistance;Sitting   Lower Body Bathing: Minimal assistance;Sitting/lateral leans   Upper Body Dressing : Minimal assistance   Lower Body Dressing: Minimal assistance Lower Body Dressing Details (indicate cue type and reason): dons socks using figure 4 Toilet Transfer: Modified Independent;Rollator (4 wheels)   Toileting- Clothing Manipulation and Hygiene: Min guard       Functional mobility during ADLs: Modified independent;Rollator (4 wheels)       Vision   Vision Assessment?: No apparent visual  deficits     Perception Perception Perception Tested?: No   Praxis Praxis Praxis tested?: Not tested    Pertinent Vitals/Pain       Hand Dominance Left   Extremity/Trunk Assessment Upper Extremity Assessment Upper Extremity Assessment: RUE deficits/detail RUE Deficits  / Details: pt reports was edematous, mild edema, decreased sensation in palm area, 2+/5 grasp, can perform digit opposition but unabel to reach 5th digit, can extend digits with forarm pronated RUE Coordination: decreased fine motor   Lower Extremity Assessment Lower Extremity Assessment: Defer to PT evaluation   Cervical / Trunk Assessment Cervical / Trunk Assessment: Neck Surgery (07/2022)   Communication Communication Communication: No difficulties   Cognition                                             General Comments  HR up to 135bpm with mobility    Exercises     Shoulder Instructions      Home Living Family/patient expects to be discharged to:: Private residence Living Arrangements: Other relatives (Cousin) Available Help at Discharge: Family;Available PRN/intermittently Type of Home: House Home Access: Stairs to enter [employer] of Steps: 2 Entrance Stairs-Rails: Can reach both Home Layout: One level     Bathroom Shower/Tub: Tub/shower unit;Curtain   Firefighter: Standard Bathroom Accessibility: Yes   Home Equipment: Rollator (4 wheels);BSC/3in1;Hand held shower head          Prior Functioning/Environment Prior Level of Function : Independent/Modified Independent;Working/employed;Driving             Mobility Comments: Mod I rollator ADLs Comments: Ind        OT Problem List: Decreased strength;Decreased range of motion;Decreased activity tolerance;Impaired balance (sitting and/or standing);Impaired UE functional use;Decreased safety awareness;Cardiopulmonary status limiting activity      OT Treatment/Interventions: Self-care/ADL training;Therapeutic exercise;Energy conservation;DME and/or AE instruction;Therapeutic activities;Patient/family education;Balance training;Modalities;Cognitive remediation/compensation    OT Goals(Current goals can be found in the care plan section) Acute Rehab OT Goals Patient Stated  Goal: none stated OT Goal Formulation: With patient Time For Goal Achievement: 10/27/22 Potential to Achieve Goals: Good ADL Goals Pt Will Perform Upper Body Dressing: Independently;sitting Pt Will Perform Lower Body Dressing: Independently;sitting/lateral leans;sit to/from stand Pt Will Transfer to Toilet: Independently;ambulating;regular height toilet Pt Will Perform Tub/Shower Transfer: Tub transfer;Shower transfer;Independently;ambulating Pt/caregiver will Perform Home Exercise Program: Right Upper extremity;Increased strength;Increased ROM;With written HEP provided;Independently  OT Frequency: Min 1X/week    Co-evaluation              AM-PAC OT "6 Clicks" Daily Activity     Outcome Measure Help from another person eating meals?: None Help from another person taking care of personal grooming?: A Little Help from another person toileting, which includes using toliet, bedpan, or urinal?: A Little Help from another person bathing (including washing, rinsing, drying)?: A Little Help from another person to put on and taking off regular upper body clothing?: A Little Help from another person to put on and taking off regular lower body clothing?: A Little 6 Click Score: 19   End of Session Equipment Utilized During Treatment: Rollator (4 wheels) Nurse Communication: Mobility status;Other (comment) (HR)  Activity Tolerance: Patient tolerated treatment well Patient left: in chair;with call bell/phone within reach;with chair alarm set  OT Visit Diagnosis: Unsteadiness on feet (R26.81);Other abnormalities of gait and mobility (R26.89);Muscle weakness (generalized) (M62.81)  Time: 1610-9604 OT Time Calculation (min): 29 min Charges:  OT General Charges $OT Visit: 1 Visit OT Evaluation $OT Eval Moderate Complexity: 1 Mod  Wilkie Zenon K, OTD, OTR/L SecureChat Preferred Acute Rehab [phone]  Ah Bott K Koonce 10/13/2022, 12:04 PM

## 2022-10-13 NOTE — Plan of Care (Signed)

## 2022-10-14 LAB — CULTURE, BLOOD (ROUTINE X 2)

## 2022-10-24 DIAGNOSIS — J90 Pleural effusion, not elsewhere classified: Secondary | ICD-10-CM | POA: Diagnosis not present

## 2022-10-24 DIAGNOSIS — K7460 Unspecified cirrhosis of liver: Secondary | ICD-10-CM | POA: Diagnosis not present

## 2022-10-24 DIAGNOSIS — M25431 Effusion, right wrist: Secondary | ICD-10-CM | POA: Diagnosis not present

## 2022-10-24 DIAGNOSIS — E876 Hypokalemia: Secondary | ICD-10-CM | POA: Diagnosis not present

## 2022-10-24 DIAGNOSIS — R630 Anorexia: Secondary | ICD-10-CM | POA: Diagnosis not present

## 2022-10-24 DIAGNOSIS — I2699 Other pulmonary embolism without acute cor pulmonale: Secondary | ICD-10-CM | POA: Diagnosis not present

## 2022-10-24 DIAGNOSIS — I10 Essential (primary) hypertension: Secondary | ICD-10-CM | POA: Diagnosis not present

## 2022-10-24 DIAGNOSIS — F17200 Nicotine dependence, unspecified, uncomplicated: Secondary | ICD-10-CM | POA: Diagnosis not present

## 2022-10-24 DIAGNOSIS — R600 Localized edema: Secondary | ICD-10-CM | POA: Diagnosis not present

## 2022-11-06 ENCOUNTER — Other Ambulatory Visit: Payer: Self-pay | Admitting: Internal Medicine

## 2022-11-06 ENCOUNTER — Encounter: Payer: Self-pay | Admitting: Internal Medicine

## 2022-11-06 DIAGNOSIS — Z0000 Encounter for general adult medical examination without abnormal findings: Secondary | ICD-10-CM

## 2022-11-06 DIAGNOSIS — Z1231 Encounter for screening mammogram for malignant neoplasm of breast: Secondary | ICD-10-CM

## 2022-11-07 DIAGNOSIS — H5203 Hypermetropia, bilateral: Secondary | ICD-10-CM | POA: Diagnosis not present

## 2022-11-07 DIAGNOSIS — M7501 Adhesive capsulitis of right shoulder: Secondary | ICD-10-CM | POA: Diagnosis not present

## 2022-11-07 DIAGNOSIS — M67331 Transient synovitis, right wrist: Secondary | ICD-10-CM | POA: Diagnosis not present

## 2022-11-08 ENCOUNTER — Ambulatory Visit
Admission: RE | Admit: 2022-11-08 | Discharge: 2022-11-08 | Disposition: A | Discharge status: Home / Self Care | Payer: No Typology Code available for payment source | Source: Ambulatory Visit | Attending: Internal Medicine | Admitting: Internal Medicine

## 2022-11-08 DIAGNOSIS — Z1231 Encounter for screening mammogram for malignant neoplasm of breast: Secondary | ICD-10-CM

## 2022-11-28 DIAGNOSIS — M25519 Pain in unspecified shoulder: Secondary | ICD-10-CM | POA: Diagnosis not present

## 2022-11-28 DIAGNOSIS — M25611 Stiffness of right shoulder, not elsewhere classified: Secondary | ICD-10-CM | POA: Diagnosis not present

## 2022-12-06 ENCOUNTER — Ambulatory Visit: Payer: No Typology Code available for payment source | Attending: Internal Medicine

## 2022-12-24 NOTE — Progress Notes (Deleted)
   Kristen Bautista, female    DOB: [birth year]    MRN: [id number]   Brief patient profile:  50  yobf  *** referred to pulmonary clinic in [place]  12/26/2022 by *** for ***      History of Present Illness  12/26/2022  Pulmonary/ 1st office eval/ Sherene Sires / [place] [facility]  No chief complaint on file.    Dyspnea:  *** Cough: *** Sleep: *** SABA use: *** 02: *** Lung cancer screen: ***   Outpatient Medications Prior to Visit  Medication Sig Dispense Refill   apixaban (ELIQUIS) 5 MG TABS tablet Take 1 tablet (5 mg total) by mouth 2 (two) times daily. 60 tablet 0   apixaban (ELIQUIS) 5 MG TABS tablet Take 2 tablets (10 mg total) by mouth 2 (two) times daily for 5 days. 20 tablet 0   atorvastatin (LIPITOR) 40 MG tablet Take 1 tablet (40 mg total) by mouth daily. 90 tablet 3   Baclofen 5 MG TABS Take 1 tablet (5 mg total) by mouth 3 (three) times daily. 90 tablet 0   gabapentin (NEURONTIN) 300 MG capsule Take 1 capsule (300 mg total) by mouth 2 (two) times daily. 60 capsule 0   guaiFENesin-dextromethorphan (ROBITUSSIN DM) 100-10 MG/5ML syrup Take 10 mLs by mouth every 4 (four) hours as needed for cough. 118 mL 0   methocarbamol (ROBAXIN) 500 MG tablet Take 1 tablet (500 mg total) by mouth every 6 (six) hours as needed for muscle spasms. 60 tablet 0   metoprolol tartrate (LOPRESSOR) 25 MG tablet Take 1 tablet (25 mg total) by mouth 2 (two) times daily. 60 tablet 0   Oxycodone HCl 10 MG TABS Take 1 tablet (10 mg total) by mouth every 3 (three) hours as needed for severe pain ((score 7 to 10)). 30 tablet 0   No facility-administered medications prior to visit.    Past Medical History:  Diagnosis Date   Alcohol abuse    Anemia    Aortic atherosclerosis (HCC)    Cannabis abuse    Centrilobular emphysema (HCC)    Cervical myelopathy (HCC)    with BUE paresthesias   Hepatitis-C    HLD (hyperlipidemia)    HTN (hypertension)    Nicotine addiction    Tobacco abuse       Objective:      There were no vitals taken for this visit.         Assessment   No problem-specific Assessment & Plan notes found for this encounter.     Sandrea Hughs, MD 12/24/2022

## 2022-12-26 ENCOUNTER — Institutional Professional Consult (permissible substitution): Payer: No Typology Code available for payment source | Admitting: Internal Medicine

## 2023-01-14 ENCOUNTER — Other Ambulatory Visit: Payer: Self-pay | Admitting: Internal Medicine

## 2023-01-14 DIAGNOSIS — I2699 Other pulmonary embolism without acute cor pulmonale: Secondary | ICD-10-CM | POA: Diagnosis not present

## 2023-01-14 DIAGNOSIS — G8929 Other chronic pain: Secondary | ICD-10-CM | POA: Diagnosis not present

## 2023-01-14 DIAGNOSIS — M4712 Other spondylosis with myelopathy, cervical region: Secondary | ICD-10-CM | POA: Diagnosis not present

## 2023-01-14 DIAGNOSIS — F17200 Nicotine dependence, unspecified, uncomplicated: Secondary | ICD-10-CM | POA: Diagnosis not present

## 2023-01-14 DIAGNOSIS — R000 Tachycardia, unspecified: Secondary | ICD-10-CM | POA: Diagnosis not present

## 2023-01-14 DIAGNOSIS — I2510 Atherosclerotic heart disease of native coronary artery without angina pectoris: Secondary | ICD-10-CM | POA: Diagnosis not present

## 2023-01-14 DIAGNOSIS — I700 Atherosclerosis of aorta: Secondary | ICD-10-CM | POA: Diagnosis not present

## 2023-01-14 DIAGNOSIS — M7989 Other specified soft tissue disorders: Secondary | ICD-10-CM

## 2023-01-14 DIAGNOSIS — M4802 Spinal stenosis, cervical region: Secondary | ICD-10-CM | POA: Diagnosis not present

## 2023-01-14 DIAGNOSIS — M25511 Pain in right shoulder: Secondary | ICD-10-CM | POA: Diagnosis not present

## 2023-01-17 DIAGNOSIS — M25511 Pain in right shoulder: Secondary | ICD-10-CM | POA: Diagnosis not present

## 2023-01-17 DIAGNOSIS — M7989 Other specified soft tissue disorders: Secondary | ICD-10-CM | POA: Diagnosis not present

## 2023-01-23 ENCOUNTER — Ambulatory Visit
Admission: RE | Admit: 2023-01-23 | Discharge: 2023-01-23 | Disposition: A | Discharge status: Home / Self Care | Payer: Medicare HMO | Source: Ambulatory Visit | Attending: Internal Medicine

## 2023-01-23 DIAGNOSIS — M7989 Other specified soft tissue disorders: Secondary | ICD-10-CM

## 2023-01-25 ENCOUNTER — Emergency Department (HOSPITAL_COMMUNITY)
Admission: EM | Admit: 2023-01-25 | Discharge: 2023-01-25 | Disposition: A | Discharge status: Home / Self Care | Payer: Medicare HMO | Attending: Emergency Medicine | Admitting: Emergency Medicine

## 2023-01-25 ENCOUNTER — Emergency Department (HOSPITAL_COMMUNITY): Payer: Medicare HMO

## 2023-01-25 ENCOUNTER — Encounter (HOSPITAL_COMMUNITY): Payer: Self-pay | Admitting: *Deleted

## 2023-01-25 ENCOUNTER — Other Ambulatory Visit: Payer: Self-pay

## 2023-01-25 DIAGNOSIS — Z79899 Other long term (current) drug therapy: Secondary | ICD-10-CM | POA: Insufficient documentation

## 2023-01-25 DIAGNOSIS — Z981 Arthrodesis status: Secondary | ICD-10-CM | POA: Diagnosis not present

## 2023-01-25 DIAGNOSIS — Z7901 Long term (current) use of anticoagulants: Secondary | ICD-10-CM | POA: Insufficient documentation

## 2023-01-25 DIAGNOSIS — M48061 Spinal stenosis, lumbar region without neurogenic claudication: Secondary | ICD-10-CM | POA: Diagnosis not present

## 2023-01-25 DIAGNOSIS — I700 Atherosclerosis of aorta: Secondary | ICD-10-CM | POA: Diagnosis not present

## 2023-01-25 DIAGNOSIS — M62830 Muscle spasm of back: Secondary | ICD-10-CM

## 2023-01-25 DIAGNOSIS — I6782 Cerebral ischemia: Secondary | ICD-10-CM | POA: Diagnosis not present

## 2023-01-25 DIAGNOSIS — I10 Essential (primary) hypertension: Secondary | ICD-10-CM | POA: Insufficient documentation

## 2023-01-25 DIAGNOSIS — M545 Low back pain, unspecified: Secondary | ICD-10-CM | POA: Diagnosis not present

## 2023-01-25 DIAGNOSIS — M5412 Radiculopathy, cervical region: Secondary | ICD-10-CM | POA: Diagnosis not present

## 2023-01-25 DIAGNOSIS — M549 Dorsalgia, unspecified: Secondary | ICD-10-CM | POA: Diagnosis not present

## 2023-01-25 DIAGNOSIS — M542 Cervicalgia: Secondary | ICD-10-CM | POA: Diagnosis not present

## 2023-01-25 DIAGNOSIS — M791 Myalgia, unspecified site: Secondary | ICD-10-CM | POA: Diagnosis not present

## 2023-01-25 DIAGNOSIS — M5136 Other intervertebral disc degeneration, lumbar region: Secondary | ICD-10-CM | POA: Diagnosis not present

## 2023-01-25 DIAGNOSIS — M62838 Other muscle spasm: Secondary | ICD-10-CM | POA: Diagnosis not present

## 2023-01-25 LAB — URINALYSIS, ROUTINE W REFLEX MICROSCOPIC
Bacteria, UA: NONE SEEN
Bilirubin Urine: NEGATIVE
Glucose, UA: NEGATIVE mg/dL
Ketones, ur: NEGATIVE mg/dL
Leukocytes,Ua: NEGATIVE
Nitrite: NEGATIVE
Protein, ur: NEGATIVE mg/dL
Specific Gravity, Urine: 1.009 (ref 1.005–1.030)
pH: 5 (ref 5.0–8.0)

## 2023-01-25 LAB — CBC WITH DIFFERENTIAL/PLATELET
Abs Immature Granulocytes: 0.02 10*3/uL (ref 0.00–0.07)
Basophils Absolute: 0 10*3/uL (ref 0.0–0.1)
Basophils Relative: 1 %
Eosinophils Absolute: 0.1 10*3/uL (ref 0.0–0.5)
Eosinophils Relative: 1 %
HCT: 38.2 % (ref 36.0–46.0)
Hemoglobin: 12.9 g/dL (ref 12.0–15.0)
Immature Granulocytes: 0 %
Lymphocytes Relative: 34 %
Lymphs Abs: 2.9 10*3/uL (ref 0.7–4.0)
MCH: 29.5 pg (ref 26.0–34.0)
MCHC: 33.8 g/dL (ref 30.0–36.0)
MCV: 87.2 fL (ref 80.0–100.0)
Monocytes Absolute: 0.8 10*3/uL (ref 0.1–1.0)
Monocytes Relative: 9 %
Neutro Abs: 4.7 10*3/uL (ref 1.7–7.7)
Neutrophils Relative %: 55 %
Platelets: 277 10*3/uL (ref 150–400)
RBC: 4.38 MIL/uL (ref 3.87–5.11)
RDW: 12.6 % (ref 11.5–15.5)
WBC: 8.5 10*3/uL (ref 4.0–10.5)
nRBC: 0 % (ref 0.0–0.2)

## 2023-01-25 LAB — BASIC METABOLIC PANEL
Anion gap: 12 (ref 5–15)
BUN: 14 mg/dL (ref 8–23)
CO2: 21 mmol/L — ABNORMAL LOW (ref 22–32)
Calcium: 9.4 mg/dL (ref 8.9–10.3)
Chloride: 103 mmol/L (ref 98–111)
Creatinine, Ser: 0.75 mg/dL (ref 0.44–1.00)
GFR, Estimated: >60 mL/min (ref >60)
Glucose, Bld: 99 mg/dL (ref 70–99)
Potassium: 4.2 mmol/L (ref 3.5–5.1)
Sodium: 136 mmol/L (ref 135–145)

## 2023-01-25 MED ORDER — GADOBUTROL 1 MMOL/ML IV SOLN
6.0000 mL | Freq: Once | INTRAVENOUS | Status: AC | PRN
  Administered 2023-01-25: 6 mL via INTRAVENOUS

## 2023-01-25 MED ORDER — OXYCODONE HCL 5 MG PO TABS
5.0000 mg | ORAL_TABLET | Freq: Once | ORAL | Status: AC
  Administered 2023-01-25: 5 mg via ORAL
  Filled 2023-01-25: qty 1

## 2023-01-25 MED ORDER — DIAZEPAM 2 MG PO TABS: 2.0000 mg | ORAL_TABLET | Freq: Four times a day (QID) | ORAL | 0 refills | Status: AC | PRN

## 2023-01-25 MED ORDER — DIAZEPAM 2 MG PO TABS
2.0000 mg | ORAL_TABLET | Freq: Once | ORAL | Status: AC
  Administered 2023-01-25: 2 mg via ORAL
  Filled 2023-01-25: qty 1

## 2023-01-25 MED ORDER — LIDOCAINE 5 % EX PTCH
1.0000 | MEDICATED_PATCH | CUTANEOUS | Status: DC
  Administered 2023-01-25: 1 via TRANSDERMAL
  Filled 2023-01-25: qty 1

## 2023-01-25 MED ORDER — ACETAMINOPHEN 500 MG PO TABS
1000.0000 mg | ORAL_TABLET | Freq: Once | ORAL | Status: AC
  Administered 2023-01-25: 1000 mg via ORAL
  Filled 2023-01-25: qty 2

## 2023-01-25 NOTE — ED Triage Notes (Signed)
C/o neck pain and muscle aches oneset Feb. States she has seen her doctor however no dx.

## 2023-01-25 NOTE — ED Notes (Signed)
Pt verbalized understanding of discharge instructions. Pt wheeled from ed

## 2023-01-25 NOTE — Discharge Instructions (Addendum)
Your workup today is overall reassuring.  MRI, CT did not show any concerning findings for your pain.  Likely muscle spasm.  You did have improvement with medications in the emergency department.  I have given you a short prescription for Valium.  This should help with the muscle spasms.  Do not take a muscle relaxer while you are taking this medication.  For any concerning symptoms return to the emergency room otherwise follow-up with your primary care provider.

## 2023-01-25 NOTE — ED Provider Notes (Signed)
[place] EMERGENCY DEPARTMENT AT [facility] Provider Note   CSN: [id number] Arrival date & time: 01/25/23  4742     History  Chief Complaint  Patient presents with   Torticollis    Kristen Bautista is a 67 y.o. female.  67 year old female with history of cervical myelopathy who underwent instrumentation on 07/28/2022 who presents today for concern of low back pain, as well as persistent neck pain.  She states the symptoms have been ongoing since she was discharged from the hospital back in March however last night her low back pain significantly worsened and causes severe pain with any amount of movement.  She reports chronic swelling to the right upper extremity since procedure, and being unable to make a fist in the right hand which is also chronic since the surgery.  She denies any fever.  She is taking her home medications without significant relief.  She denies bowel or bladder dysfunction, saddle anesthesia.  Patient lives with her cousin since she was discharged from the hospital.  The history is provided by the patient. No language interpreter was used.       Home Medications Prior to Admission medications   Medication Sig Start Date End Date Taking? Authorizing Provider  apixaban (ELIQUIS) 5 MG TABS tablet Take 1 tablet (5 mg total) by mouth 2 (two) times daily. 10/19/22 11/18/22  Lanae Boast, MD  apixaban (ELIQUIS) 5 MG TABS tablet Take 2 tablets (10 mg total) by mouth 2 (two) times daily for 5 days. 10/13/22 10/18/22  Lanae Boast, MD  atorvastatin (LIPITOR) 40 MG tablet Take 1 tablet (40 mg total) by mouth daily. 10/03/22   Orbie Pyo, MD  Baclofen 5 MG TABS Take 1 tablet (5 mg total) by mouth 3 (three) times daily. 08/22/22   Angiulli, Mcarthur Rossetti, PA-C  gabapentin (NEURONTIN) 300 MG capsule Take 1 capsule (300 mg total) by mouth 2 (two) times daily. 08/22/22   Angiulli, Mcarthur Rossetti, PA-C  guaiFENesin-dextromethorphan (ROBITUSSIN DM) 100-10 MG/5ML syrup Take 10 mLs by mouth  every 4 (four) hours as needed for cough. 10/13/22   Lanae Boast, MD  methocarbamol (ROBAXIN) 500 MG tablet Take 1 tablet (500 mg total) by mouth every 6 (six) hours as needed for muscle spasms. 08/22/22   Angiulli, Mcarthur Rossetti, PA-C  metoprolol tartrate (LOPRESSOR) 25 MG tablet Take 1 tablet (25 mg total) by mouth 2 (two) times daily. 10/13/22 11/12/22  Lanae Boast, MD  Oxycodone HCl 10 MG TABS Take 1 tablet (10 mg total) by mouth every 3 (three) hours as needed for severe pain ((score 7 to 10)). 08/22/22   Angiulli, Mcarthur Rossetti, PA-C      Allergies    Patient has no known allergies.    Review of Systems   Review of Systems  Constitutional:  Negative for chills and fever.  Respiratory:  Negative for shortness of breath.   Gastrointestinal:  Negative for abdominal pain, nausea and vomiting.  Musculoskeletal:  Positive for arthralgias, back pain and neck pain.  Neurological:  Negative for light-headedness.  All other systems reviewed and are negative.   Physical Exam Updated Vital Signs BP (!) 143/85 (BP Location: Left Arm)   Pulse (!) 104   Temp 97.7 F (36.5 C) (Oral)   Resp 18   Ht 5\' 7"  (1.702 m)   Wt 59 kg   SpO2 97%   BMI 20.36 kg/m  Physical Exam Vitals and nursing note reviewed.  Constitutional:      General: She  is not in acute distress.    Appearance: Normal appearance. She is not ill-appearing.  HENT:     Head: Normocephalic and atraumatic.     Nose: Nose normal.  Eyes:     Conjunctiva/sclera: Conjunctivae normal.  Cardiovascular:     Rate and Rhythm: Normal rate and regular rhythm.  Pulmonary:     Effort: Pulmonary effort is normal. No respiratory distress.     Breath sounds: No wheezing.  Musculoskeletal:        General: No deformity. Normal range of motion.     Comments: Good range of motion bilateral upper and lower extremity.  Good strength in bilateral upper and lower extremities.  There is pain with hip and knee flexion in the low back.  Limited range of motion in the  lumbar spine secondary to pain.  There is tenderness to palpation over the lumbar spine.  Skin:    Findings: No rash.  Neurological:     Mental Status: She is alert.     ED Results / Procedures / Treatments   Labs (all labs ordered are listed, but only abnormal results are displayed) Labs Reviewed  CBC WITH DIFFERENTIAL/PLATELET  BASIC METABOLIC PANEL  URINALYSIS, ROUTINE W REFLEX MICROSCOPIC    EKG None  Radiology US Venous Img Upper Uni Right(DVT)  Result Date: 01/23/2023 CLINICAL DATA:  Right upper extremity swelling EXAM: RIGHT UPPER EXTREMITY VENOUS DOPPLER ULTRASOUND TECHNIQUE: Gray-scale sonography with graded compression, as well as color Doppler and duplex ultrasound were performed to evaluate the upper extremity deep venous system from the level of the subclavian vein and including the jugular, axillary, basilic, radial, ulnar and upper cephalic vein. Spectral Doppler was utilized to evaluate flow at rest and with distal augmentation maneuvers. COMPARISON:  None available FINDINGS: Contralateral Subclavian Vein: Respiratory phasicity is normal and symmetric with the symptomatic side. No evidence of thrombus. Normal compressibility. Internal Jugular Vein: No evidence of thrombus. Normal compressibility, respiratory phasicity and response to augmentation. Subclavian Vein: No evidence of thrombus. Normal compressibility, respiratory phasicity and response to augmentation. Axillary Vein: No evidence of thrombus. Normal compressibility, respiratory phasicity and response to augmentation. Cephalic Vein: No evidence of thrombus. Normal compressibility, respiratory phasicity and response to augmentation. Basilic Vein: No evidence of thrombus. Normal compressibility, respiratory phasicity and response to augmentation. Brachial Veins: No evidence of thrombus. Normal compressibility, respiratory phasicity and response to augmentation. Radial Veins: No evidence of thrombus. Normal  compressibility, respiratory phasicity and response to augmentation. Ulnar Veins: No evidence of thrombus. Normal compressibility, respiratory phasicity and response to augmentation. Venous Reflux:  None visualized. Other Findings:  None visualized. IMPRESSION: No evidence of DVT within the right upper extremity. Electronically Signed   By: Acquanetta Belling M.D.   On: 01/23/2023 17:37    Procedures Procedures    Medications Ordered in ED Medications  diazepam (VALIUM) tablet 2 mg (has no administration in time range)  lidocaine (LIDODERM) 5 % 1 patch (has no administration in time range)  acetaminophen (TYLENOL) tablet 1,000 mg (has no administration in time range)  oxyCODONE (Oxy IR/ROXICODONE) immediate release tablet 5 mg (has no administration in time range)    ED Course/ Medical Decision Making/ A&P                                 Medical Decision Making Amount and/or Complexity of Data Reviewed Labs: ordered. Radiology: ordered.  Risk OTC drugs. Prescription drug management.   Medical  Decision Making / ED Course   This patient presents to the ED for concern of low back pain, neck pain, this involves an extensive number of treatment options, and is a complaint that carries with it a high risk of complications and morbidity.  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  Without fever, initially noted to be tachycardic but currently normal sinus rhythm, without saddle anesthesia, or bowel or bladder dysfunction.  Will obtain CT imaging and provide multimodal pain control and obtain labs and reevaluate.  Patient recently had DVT study of the right upper extremity which was negative.  MRI cervical spine, thoracic spine, lumbar spine shows no evidence of acute  abscess, or cauda equina syndrome.  Does show degenerative change in the lumbar spine as well as moderate stenosis.  Patient will follow-up with neurosurgery outpatient.  No emergent findings noted on MRI.  She is stable for discharge.  Will give her a short course of Valium.  Discussed not to take muscle relaxers with this.  Patient was discussed with attending and was evaluated by attending at bedside.   Lab Tests: -I ordered, reviewed, and interpreted labs.   The pertinent results include:   Labs Reviewed  CBC WITH DIFFERENTIAL/PLATELET  BASIC METABOLIC PANEL  URINALYSIS, ROUTINE W REFLEX MICROSCOPIC      EKG  EKG Interpretation Date/Time:    Ventricular Rate:    PR Interval:    QRS Duration:    QT Interval:    QTC Calculation:   R Axis:      Text Interpretation:           Imaging Studies ordered: I ordered imaging studies including CT C-spine, CT L-spine, MRI cervical spine, thoracic spine, lumbar spine with and without contrast I independently visualized and interpreted imaging. I agree with the radiologist interpretation   Medicines ordered and prescription drug management: Meds ordered this encounter  Medications   diazepam (VALIUM) tablet 2 mg   lidocaine (LIDODERM) 5 % 1 patch   acetaminophen (TYLENOL) tablet 1,000 mg   oxyCODONE (Oxy IR/ROXICODONE) immediate release tablet 5 mg    -I have reviewed the patients home medicines and have made adjustments as needed   Reevaluation: After the interventions noted above, I reevaluated the patient and found that they have :improved Significant improvement in symptoms.  Patient with good range of motion bilateral lower extremities without much pain in her low back.  Initially she could not do this.  Co morbidities that complicate the patient evaluation  Past Medical History:  Diagnosis Date   Alcohol abuse    Anemia    Aortic atherosclerosis (HCC)    Cannabis abuse    Centrilobular emphysema (HCC)     Cervical myelopathy (HCC)    with BUE paresthesias   Hepatitis-C    HLD (hyperlipidemia)    HTN (hypertension)    Nicotine addiction    Tobacco abuse       Dispostion: Patient is appropriate for discharge.  Discharged in stable condition.  Return precaution discussed.  Patient voices understanding and is in agreement with plan.    Final Clinical Impression(s) / ED Diagnoses Final diagnoses:  Neck pain  Lumbar paraspinal muscle spasm    Rx / DC Orders ED Discharge Orders          Ordered  diazepam (VALIUM) 2 MG tablet  Every 6 hours PRN        01/25/23 1346              Marita Kansas, PA-C 01/25/23 1356    Edwin Dada P, DO 01/26/23 [phone]

## 2023-02-01 DIAGNOSIS — M1990 Unspecified osteoarthritis, unspecified site: Secondary | ICD-10-CM | POA: Diagnosis not present

## 2023-02-01 DIAGNOSIS — M25511 Pain in right shoulder: Secondary | ICD-10-CM | POA: Diagnosis not present

## 2023-02-01 DIAGNOSIS — Z0000 Encounter for general adult medical examination without abnormal findings: Secondary | ICD-10-CM | POA: Diagnosis not present

## 2023-02-13 NOTE — Progress Notes (Unsigned)
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 f w/ aortic atherosclerosis, coronary artery calcification, hepatitis C, hypertension, hyperlipidemia, tobacco abuse, alcohol abuse, cannabis abuse who was admitted to the hospital on 08/01/2022 for C3-C6 severe cervical stenosis with myelopathy concerning for epidural abscess,was treated with IV antibiotics and underwent open posterior cervical lateral mass instrumentation posterior lateral fusion C3-4 4-5 and C5-6 as well as laminotomies evacuation of epidural abscess 07/28/2022 and was discharged to inpatient rehab and stayed there until 3/14 from where discharged on oral antibiotics (Levaquin and doxycycline) for additional 30 days per ID recommendation. She now presented to the ED 10/11/22 w/ epigastric abdominal pain worse with breathing over the past 2 weeks and bilateral lower extremity edema, was tachycardic and tachypneic on arrival to the ED afebrile, not hypotensive. xygen saturation in the low 90s and placed on 2 L supplemental oxygen. Labs showing WBC 17.0, hemoglobin 9.1 (at baseline), platelet count 409k, sodium 132, potassium 3.3, chloride 98, HCO3-21, BUN 15, creatinine 0.8, CBG-107, albumin 2.7, normal lipase and LFTs,troponin negative, lactic acid normal x 2, BNP normal,UA not suggestive of infection.Blood cultures collected.  CTA chest showing left lower lobe PE, moderate left-sided pleural effusion, bibasilar consolidation and volume loss.  CT abdomen pelvis showing cholelithiasis and no acute finding.  Right upper quadrant ultrasound  showing cholelithiasis without complicating factors and changes suspicious for early cirrhosis.  Patient had pain and swelling of her right hand/forearm.  X-ray of right wrist showing mild degenerative changes without an acute osseous abnormality.  Patient was started on IV heparin and was given Dilaudid, Zofran, oral potassium 40 mEq, 1 L LR bolus, vancomycin, and Zosyn. Patient admitted and managed with IV heparin drip, repeat chest x-ray much better seen by pulmonary critical care> fPCCM felt most of LLL opacity infarcted lung with only trace of 1 cm effusion with insufficient pocket for safe thoracentesis, advised transition to DOAC at least 3 months course with reevaluation for duration of clinic and clinic will follow-up as outpatient. Patient has been weaned off to room air at rest> but needing oxygen with ambulation 5/3 also became tachycardic, meds adjusted to metoprolol.  Her procalcitonin was negative, per pulmonary her consolidation is likely from pulmonary infarct show antibiotic discontinued 5/3.  Transition to Eliquis 5/3. Noted to have cholelithiasis without complicating factors in RUQ [place] and  changes suspicious for early cirrhosis noted.  She is to follow-up with her PCP/PCCM. She did well today on walking no hypoxic HR better overall compared to yesterday and requesting for discharge home.  She ambulated well today w/ PTOT and no need for home health PT OT.   Discharge Diagnoses:  Principal Problem:   Acute pulmonary embolism (HCC)   HCAP (healthcare-associated pneumonia)   Pleural effusion   HTN (hypertension)   HLD (hyperlipidemia)   Alcohol abuse   Acute left lower lobe [id number] to Eliquis. Appreciate pulmonary input continue for 3 months will need follow-up with them re duration of treatment.Remains hemodynamically stable although mildly tachycardic on ambulation, echo with preserved EF 55 to 60%, RV systolic function  hyperdynamic size normal with normal pulmonary artery  systolic pressure, G1 DD.  Upper extremity lower extremity duplex unremarkable.Will transition to DOAC soon.   Moderate left-sided pleural effusion Bibasilar consolidation volume loss: Likely in the setting of #1 with pulmonary infarct per pulmonary, procalcitonin is reassuring less likely infectious etiology iv antibiotic discontinued. Sepsis ruled out. Has mild leukocytosis likely reactive continue to monitor as outpatient   Asymptomatic cholelithiasis: LFTs unremarkable.  Ultrasound no other suspicious finding Possible early signs of cirrhosis on the ultrasound follow-up with PCP/ Hypertension: BP well controlled on amlodipine 5 mg Hyperlipidemia: Continue Lipitor Alcohol abuse history-watch for withdrawls.  Doing well over Chronic anemia-hemoglobin is stable, continue to need to  mild hyponatremia-stable Hypokalemia - replaced  Hypomagnesemia continue Mag-Ox Hypoalbuminemia:RD consulted. Lower leg edema likely from hypoalbuminemia, edema appears improved. Doppler is neg for DVT.           History of Present Illness  02/14/2023  Pulmonary/ 1st office eval/Rumaisa Schnetzer - last dfose of eliquis Chief Complaint  Patient presents with   Consult    Consult for 10/09/2022 hospital visit. Pulmonary embolism.  Dyspnea:  legs get tired before sob  Cough: none  Sleep: flat bed  2-3 pillows under head  SABA use: none  No obvious day to day or daytime pattern/variability or assoc excess/ purulent sputum or mucus plugs or hemoptysis or cp or chest tightness, subjective wheeze or overt sinus or hb symptoms.    Also denies any obvious fluctuation of symptoms with weather or environmental changes or other aggravating or alleviating factors except as outlined above   No unusual exposure hx or h/o childhood pna/ asthma or knowledge of premature birth.  Current Allergies, Complete Past Medical History, Past Surgical History, Family History, and Social History were reviewed in [employer] record.  ROS  The following are not active complaints unless bolded Hoarseness, sore throat, dysphagia, dental problems, itching, sneezing,  nasal congestion or discharge of excess mucus or purulent secretions, ear ache,   fever, chills, sweats, unintended wt loss or wt gain, classically pleuritic or exertional cp,  orthopnea pnd or arm/hand swelling  or leg swelling, presyncope, palpitations, abdominal pain, anorexia, nausea, vomiting, diarrhea  or change in bowel habits or change in bladder habits, change in stools or change in urine, dysuria, hematuria,  rash, arthralgias, visual complaints, headache, numbness, weakness or ataxia or problems with walking or coordination,  change in mood or  memory.             Outpatient Medications Prior to Visit  Medication Sig Dispense Refill   naproxen sodium (ALEVE) 220 MG tablet Take 220 mg by mouth daily as needed.     apixaban (ELIQUIS) 5 MG TABS tablet Take 1 tablet (5 mg total) by mouth 2 (two) times daily. 60 tablet 0   atorvastatin (LIPITOR) 40 MG tablet Take 1 tablet (40 mg total) by mouth daily. (Patient not taking: Reported on 02/14/2023) 90 tablet 3   Baclofen 5 MG TABS Take 1 tablet (5 mg total) by mouth 3 (three) times daily. (Patient not taking: Reported on 02/14/2023) 90 tablet 0   diazepam (VALIUM) 2 MG tablet Take 1 tablet (2 mg total) by mouth every 6 (six) hours as needed for anxiety. (Patient not taking: Reported on 02/14/2023) 7 tablet 0   gabapentin (NEURONTIN) 300 MG capsule Take 1 capsule (300 mg total) by mouth 2 (two) times daily. (Patient not taking: Reported on 02/14/2023) 60 capsule 0   metoprolol tartrate (LOPRESSOR) 25  MG tablet Take 1 tablet (25 mg total) by mouth 2 (two) times daily. 60 tablet 0   apixaban (ELIQUIS) 5 MG TABS tablet Take 2 tablets (10 mg total) by mouth 2 (two) times daily for 5 days. 20 tablet 0   guaiFENesin-dextromethorphan (ROBITUSSIN DM) 100-10 MG/5ML syrup Take 10 mLs by mouth every 4 (four) hours as  needed for cough. (Patient not taking: Reported on 02/14/2023) 118 mL 0   methocarbamol (ROBAXIN) 500 MG tablet Take 1 tablet (500 mg total) by mouth every 6 (six) hours as needed for muscle spasms. (Patient not taking: Reported on 02/14/2023) 60 tablet 0   Oxycodone HCl 10 MG TABS Take 1 tablet (10 mg total) by mouth every 3 (three) hours as needed for severe pain ((score 7 to 10)). (Patient not taking: Reported on 02/14/2023) 30 tablet 0   No facility-administered medications prior to visit.    Past Medical History:  Diagnosis Date   Alcohol abuse    Anemia    Aortic atherosclerosis (HCC)    Cannabis abuse    Centrilobular emphysema (HCC)    Cervical myelopathy (HCC)    with BUE paresthesias   Hepatitis-C    HLD (hyperlipidemia)    HTN (hypertension)    Nicotine addiction    Tobacco abuse       Objective:     BP (!) 142/80 (BP Location: Left Arm, Patient Position: Sitting, Cuff Size: Normal)   Pulse (!) 109   Temp 98 F (36.7 C) (Oral)   Ht 5\' 7"  (1.702 m)   Wt 132 lb 12.8 oz (60.2 kg)   SpO2 98%   BMI 20.80 kg/m   SpO2: 98 % RA   Amb bf  nad    HEENT : Oropharynx  clear  Nasal turbinates nl    NECK :  without  apparent JVD/ palpable Nodes/TM    LUNGS: no acc muscle use,  Min barrel  contour chest wall with bilateral  slightly decreased bs s audible wheeze and  without cough on insp or exp maneuvers and min  Hyperresonant  to  percussion bilaterally    CV:  RRR  no s3 or murmur or increase in P2, and no edema   ABD:  soft and nontender   MS:  Nl gait/ ext warm without deformities Or obvious joint restrictions  calf tenderness, cyanosis or clubbing     SKIN: warm and dry without lesions    NEURO:  alert, approp, nl sensorium with  no motor or cerebellar deficits apparent.         CXR PA and Lateral:   02/14/2023 :    I personally reviewed images and impression is as follows:     Minimal blunting L CP angle     Assessment   Acute pulmonary embolism  (HCC) Dx 10/09/22 with neg venous dopplers, neg RH strain or PH by echo  - S/p extensive neck surgery 08/01/22 - 08/23/22 released from rehab - assoc L Pl effusion > 90% resolved as of 02/14/2023   She has not been consistent with taking her eliquis due to cost issues but now has 90 day supply so my rec was to finish all of these then no reason to take again unless new risk factors (prolonger recumbancy, her main risk from before) .   Pulmonary f/u can be prn          Each maintenance medication was reviewed in detail including emphasizing most importantly the difference between maintenance and prns and  under what circumstances the prns are to be triggered using an action plan format where appropriate.  Total time for H and P, chart review, counseling, reviewing hfa  device(s) and generating customized AVS unique to this office visit / same day charting > 30 min eval / pt new to me       Cigarette smoker Counseled re importance of smoking cessation but did not meet time criteria for separate billing    Low-dose CT lung cancer screening is recommended for patients who are 54-45 years of age with a 20+ pack-year history of smoking and who are currently smoking or quit <=15 years ago. No coughing up blood  No unintentional weight loss of > 15 pounds in the last 6 months - pt is eligible for scanning yearly until 8 y p quits smoking > needs to start 10/09/23 since had CTa 10/09/22 neg for nodules >>> deferred to PCP as no plans to follow her in pulmonary clinic         Each maintenance medication was reviewed in detail including emphasizing most importantly the difference between maintenance and prns and under what circumstances the prns are to be triggered using an action plan format where appropriate.  Total time for H and P, chart review, counseling,  and generating customized AVS unique to this office visit / same day charting  > 40 min post hosp f/u of established pt             Sandrea Hughs, MD 02/14/2023

## 2023-02-14 ENCOUNTER — Ambulatory Visit (INDEPENDENT_AMBULATORY_CARE_PROVIDER_SITE_OTHER): Payer: 59 | Admitting: Internal Medicine

## 2023-02-14 ENCOUNTER — Encounter: Payer: Self-pay | Admitting: Internal Medicine

## 2023-02-14 ENCOUNTER — Ambulatory Visit (INDEPENDENT_AMBULATORY_CARE_PROVIDER_SITE_OTHER): Payer: 59

## 2023-02-14 VITALS — BP 142/80 | HR 109 | Temp 98.0°F | Ht 67.0 in | Wt 132.8 lb

## 2023-02-14 DIAGNOSIS — F17210 Nicotine dependence, cigarettes, uncomplicated: Secondary | ICD-10-CM | POA: Diagnosis not present

## 2023-02-14 DIAGNOSIS — I2699 Other pulmonary embolism without acute cor pulmonale: Secondary | ICD-10-CM

## 2023-02-14 NOTE — Assessment & Plan Note (Addendum)
Dx 10/09/22 with neg venous dopplers, neg RH strain or PH by echo  - S/p extensive neck surgery 08/01/22 - 08/23/22 released from rehab - assoc L Pl effusion > 90% resolved as of 02/14/2023   She has not been consistent with taking her eliquis due to cost issues but now has 90 day supply so my rec was to finish all of these then no reason to take again unless new risk factors (prolonger recumbancy, her main risk from before) .   Pulmonary f/u can be prn          Each maintenance medication was reviewed in detail including emphasizing most importantly the difference between maintenance and prns and under what circumstances the prns are to be triggered using an action plan format where appropriate.  Total time for H and P, chart review, counseling, reviewing hfa  device(s) and generating customized AVS unique to this office visit / same day charting > 30 min eval / pt new to me

## 2023-02-14 NOTE — Assessment & Plan Note (Signed)
Counseled re importance of smoking cessation but did not meet time criteria for separate billing    Low-dose CT lung cancer screening is recommended for patients who are 70-66 years of age with a 20+ pack-year history of smoking and who are currently smoking or quit <=15 years ago. No coughing up blood  No unintentional weight loss of > 15 pounds in the last 6 months - pt is eligible for scanning yearly until 36 y p quits smoking > needs to start 10/09/23 since had CTa 10/09/22 neg for nodules >>> deferred to PCP as no plans to follow her in pulmonary clinic         Each maintenance medication was reviewed in detail including emphasizing most importantly the difference between maintenance and prns and under what circumstances the prns are to be triggered using an action plan format where appropriate.  Total time for H and P, chart review, counseling,  and generating customized AVS unique to this office visit / same day charting  > 40 min post hosp f/u of established pt

## 2023-02-14 NOTE — Patient Instructions (Addendum)
Finish up your eliquis unless obvious side effects and if you develop any they should go away within  2-3 days of stopping and return when you resume Eliquis which would indicate you cannot continue with this medication.  Please remember to go to the  x-ray department  for your tests - we will call you with the results when they are available    The key is to stop smoking completely before smoking completely stops you!     Pulmonary follow up is as needed

## 2023-02-15 DIAGNOSIS — M47816 Spondylosis without myelopathy or radiculopathy, lumbar region: Secondary | ICD-10-CM | POA: Diagnosis not present

## 2023-02-15 DIAGNOSIS — G061 Intraspinal abscess and granuloma: Secondary | ICD-10-CM | POA: Diagnosis not present

## 2023-03-12 DIAGNOSIS — F17200 Nicotine dependence, unspecified, uncomplicated: Secondary | ICD-10-CM | POA: Diagnosis not present

## 2023-03-12 DIAGNOSIS — K7460 Unspecified cirrhosis of liver: Secondary | ICD-10-CM | POA: Diagnosis not present

## 2023-03-12 DIAGNOSIS — I700 Atherosclerosis of aorta: Secondary | ICD-10-CM | POA: Diagnosis not present

## 2023-03-12 DIAGNOSIS — D649 Anemia, unspecified: Secondary | ICD-10-CM | POA: Diagnosis not present

## 2023-03-12 DIAGNOSIS — I2699 Other pulmonary embolism without acute cor pulmonale: Secondary | ICD-10-CM | POA: Diagnosis not present

## 2023-03-12 DIAGNOSIS — Z122 Encounter for screening for malignant neoplasm of respiratory organs: Secondary | ICD-10-CM | POA: Diagnosis not present

## 2023-03-12 DIAGNOSIS — I2510 Atherosclerotic heart disease of native coronary artery without angina pectoris: Secondary | ICD-10-CM | POA: Diagnosis not present

## 2023-03-12 DIAGNOSIS — M778 Other enthesopathies, not elsewhere classified: Secondary | ICD-10-CM | POA: Diagnosis not present

## 2023-03-12 DIAGNOSIS — M65931 Unspecified synovitis and tenosynovitis, right forearm: Secondary | ICD-10-CM | POA: Diagnosis not present

## 2023-03-21 ENCOUNTER — Ambulatory Visit
Admission: RE | Admit: 2023-03-21 | Discharge: 2023-03-21 | Disposition: A | Discharge status: Home / Self Care | Payer: 59 | Source: Ambulatory Visit | Attending: Internal Medicine | Admitting: Internal Medicine

## 2023-03-21 DIAGNOSIS — E349 Endocrine disorder, unspecified: Secondary | ICD-10-CM | POA: Diagnosis not present

## 2023-03-21 DIAGNOSIS — M8588 Other specified disorders of bone density and structure, other site: Secondary | ICD-10-CM | POA: Diagnosis not present

## 2023-03-21 DIAGNOSIS — E2839 Other primary ovarian failure: Secondary | ICD-10-CM

## 2023-03-21 DIAGNOSIS — Z13820 Encounter for screening for osteoporosis: Secondary | ICD-10-CM

## 2023-04-17 DIAGNOSIS — H40023 Open angle with borderline findings, high risk, bilateral: Secondary | ICD-10-CM | POA: Diagnosis not present

## 2023-04-17 DIAGNOSIS — H2513 Age-related nuclear cataract, bilateral: Secondary | ICD-10-CM | POA: Diagnosis not present

## 2023-04-17 DIAGNOSIS — H5203 Hypermetropia, bilateral: Secondary | ICD-10-CM | POA: Diagnosis not present

## 2023-04-17 DIAGNOSIS — H524 Presbyopia: Secondary | ICD-10-CM | POA: Diagnosis not present

## 2023-04-17 DIAGNOSIS — H52223 Regular astigmatism, bilateral: Secondary | ICD-10-CM | POA: Diagnosis not present

## 2023-05-29 DIAGNOSIS — H47232 Glaucomatous optic atrophy, left eye: Secondary | ICD-10-CM | POA: Diagnosis not present

## 2023-05-29 DIAGNOSIS — H40023 Open angle with borderline findings, high risk, bilateral: Secondary | ICD-10-CM | POA: Diagnosis not present

## 2023-07-14 DIAGNOSIS — R000 Tachycardia, unspecified: Secondary | ICD-10-CM | POA: Diagnosis not present

## 2023-07-14 DIAGNOSIS — Z9981 Dependence on supplemental oxygen: Secondary | ICD-10-CM | POA: Diagnosis not present

## 2023-07-14 DIAGNOSIS — R0602 Shortness of breath: Secondary | ICD-10-CM | POA: Diagnosis not present

## 2023-09-08 ENCOUNTER — Other Ambulatory Visit: Payer: Self-pay | Admitting: Internal Medicine

## 2023-09-13 DIAGNOSIS — M810 Age-related osteoporosis without current pathological fracture: Secondary | ICD-10-CM | POA: Diagnosis not present

## 2023-09-13 DIAGNOSIS — I700 Atherosclerosis of aorta: Secondary | ICD-10-CM | POA: Diagnosis not present

## 2023-09-13 DIAGNOSIS — D369 Benign neoplasm, unspecified site: Secondary | ICD-10-CM | POA: Diagnosis not present

## 2023-09-13 DIAGNOSIS — J438 Other emphysema: Secondary | ICD-10-CM | POA: Diagnosis not present

## 2023-09-13 DIAGNOSIS — I2699 Other pulmonary embolism without acute cor pulmonale: Secondary | ICD-10-CM | POA: Diagnosis not present

## 2023-09-13 DIAGNOSIS — Z0000 Encounter for general adult medical examination without abnormal findings: Secondary | ICD-10-CM | POA: Diagnosis not present

## 2023-09-13 DIAGNOSIS — F17200 Nicotine dependence, unspecified, uncomplicated: Secondary | ICD-10-CM | POA: Diagnosis not present

## 2023-09-13 DIAGNOSIS — I10 Essential (primary) hypertension: Secondary | ICD-10-CM | POA: Diagnosis not present

## 2023-09-13 DIAGNOSIS — K7460 Unspecified cirrhosis of liver: Secondary | ICD-10-CM | POA: Diagnosis not present

## 2023-09-13 DIAGNOSIS — I2510 Atherosclerotic heart disease of native coronary artery without angina pectoris: Secondary | ICD-10-CM | POA: Diagnosis not present

## 2023-10-01 ENCOUNTER — Telehealth: Payer: Self-pay | Admitting: Acute Care

## 2023-10-01 ENCOUNTER — Other Ambulatory Visit: Payer: Self-pay

## 2023-10-01 DIAGNOSIS — Z87891 Personal history of nicotine dependence: Secondary | ICD-10-CM

## 2023-10-01 DIAGNOSIS — F17210 Nicotine dependence, cigarettes, uncomplicated: Secondary | ICD-10-CM

## 2023-10-01 DIAGNOSIS — Z122 Encounter for screening for malignant neoplasm of respiratory organs: Secondary | ICD-10-CM

## 2023-10-01 NOTE — Telephone Encounter (Signed)
 Lung Cancer Screening Narrative/Criteria Questionnaire (Cigarette Smokers Only- No Cigars/Pipes/vapes)   Kristen Bautista   SDMV:10/30/23 ata 1030a/[facility]                                           10-08-1955                        LDCT: 10/31/23 at 1030a/ [facility]    68 y.o.   Phone: [phone]  Lung Screening Narrative (confirm age 39-77 yrs Medicare / 50-80 yrs Private pay insurance)   Insurance information:[employer]   Referring Provider:Pahwani   This screening involves an initial phone call with a team member from our program. It is called a shared decision making visit. The initial meeting is required by insurance and Medicare to make sure you understand the program. This appointment takes about 15-20 minutes to complete. The CT scan will completed at a separate date/time. This scan takes about 5-10 minutes to complete and you may eat and drink before and after the scan.  Criteria questions for Lung Cancer Screening:   Are you a current or former smoker? Current Age began smoking: 68 yo   If you are a former smoker, what year did you quit smoking? NA   To calculate your smoking history, I need an accurate estimate of how many packs of cigarettes you smoked per day and for how many years. (Not just the number of PPD you are now smoking)   Years smoking 52 x Packs per day 1/2 = Pack years 26   (at least 20 pack yrs)   (Make sure they understand that we need to know how much they have smoked in the past, not just the number of PPD they are smoking now)  Do you have a personal history of cancer?  No    Do you have a family history of cancer? No  Are you coughing up blood?  No  Have you had unexplained weight loss of 15 lbs or more in the last 6 months? No  It looks like you meet all criteria.     Additional information: N/A

## 2023-10-30 ENCOUNTER — Encounter: Payer: Self-pay | Admitting: Adult Health

## 2023-10-30 ENCOUNTER — Ambulatory Visit: Admitting: Adult Health

## 2023-10-30 DIAGNOSIS — F17210 Nicotine dependence, cigarettes, uncomplicated: Secondary | ICD-10-CM

## 2023-10-30 NOTE — Patient Instructions (Signed)

## 2023-10-30 NOTE — Progress Notes (Signed)
  Virtual Visit via Telephone Note  I connected with Kristen Bautista , 10/30/23 10:11 AM by a telemedicine application and verified that I am speaking with the correct person using two identifiers.  Location: Patient: home Provider: home   I discussed the limitations of evaluation and management by telemedicine and the availability of in person appointments. The patient expressed understanding and agreed to proceed.   Shared Decision Making Visit Lung Cancer Screening Program [phone])   Eligibility: 68 y.o. Pack Years Smoking History Calculation = 26 pack years  (# packs/per year x # years smoked) Recent History of coughing up blood  no Unexplained weight loss? no ( >Than 15 pounds within the last 6 months ) Prior History Lung / other cancer no (Diagnosis within the last 5 years already requiring surveillance chest CT Scans). Smoking Status Current Smoker   Visit Components: Discussion included one or more decision making aids. YES Discussion included risk/benefits of screening. YES Discussion included potential follow up diagnostic testing for abnormal scans. YES Discussion included meaning and risk of over diagnosis. YES Discussion included meaning and risk of False Positives. YES Discussion included meaning of total radiation exposure. YES  Counseling Included: Importance of adherence to annual lung cancer LDCT screening. YES Impact of comorbidities on ability to participate in the program. YES Ability and willingness to under diagnostic treatment. YES  Smoking Cessation Counseling: Current Smokers:  Discussed importance of smoking cessation. yes Information about tobacco cessation classes and interventions provided to patient. yes Patient provided with "ticket" for LDCT Scan. yes Symptomatic Patient. NO Diagnosis Code: Tobacco Use Z72.0 Asymptomatic Patient yes  Counseling - 4 minutes of smoking cessation counseling (CT Chest Lung Cancer Screening Low Dose W/O CM)  [id number]  Z12.2-Screening of respiratory organs Z87.891-Personal history of nicotine dependence   [place] Dose 10/30/23

## 2023-10-31 ENCOUNTER — Ambulatory Visit (HOSPITAL_BASED_OUTPATIENT_CLINIC_OR_DEPARTMENT_OTHER)
Admission: RE | Admit: 2023-10-31 | Discharge: 2023-10-31 | Disposition: A | Discharge status: Home / Self Care | Source: Ambulatory Visit | Attending: Internal Medicine | Admitting: Internal Medicine

## 2023-10-31 DIAGNOSIS — Z87891 Personal history of nicotine dependence: Secondary | ICD-10-CM | POA: Diagnosis not present

## 2023-10-31 DIAGNOSIS — Z122 Encounter for screening for malignant neoplasm of respiratory organs: Secondary | ICD-10-CM | POA: Diagnosis not present

## 2023-10-31 DIAGNOSIS — F17210 Nicotine dependence, cigarettes, uncomplicated: Secondary | ICD-10-CM | POA: Insufficient documentation

## 2023-11-19 ENCOUNTER — Other Ambulatory Visit: Payer: Self-pay

## 2023-11-19 DIAGNOSIS — F17210 Nicotine dependence, cigarettes, uncomplicated: Secondary | ICD-10-CM

## 2023-11-19 DIAGNOSIS — Z87891 Personal history of nicotine dependence: Secondary | ICD-10-CM

## 2023-11-19 DIAGNOSIS — Z122 Encounter for screening for malignant neoplasm of respiratory organs: Secondary | ICD-10-CM

## 2023-12-26 ENCOUNTER — Other Ambulatory Visit: Payer: Self-pay | Admitting: Internal Medicine

## 2023-12-26 DIAGNOSIS — Z1231 Encounter for screening mammogram for malignant neoplasm of breast: Secondary | ICD-10-CM

## 2024-01-14 ENCOUNTER — Ambulatory Visit
Admission: RE | Admit: 2024-01-14 | Discharge: 2024-01-14 | Disposition: A | Discharge status: Home / Self Care | Source: Ambulatory Visit | Attending: Internal Medicine

## 2024-01-14 DIAGNOSIS — Z1231 Encounter for screening mammogram for malignant neoplasm of breast: Secondary | ICD-10-CM
